# Patient Record
Sex: Male | Born: 1950 | Race: White | Hispanic: No | Smoking: Former smoker
Health system: Southern US, Community
[De-identification: ages and names within clinical notes are randomized; demographics above are authoritative.]

## PROBLEM LIST (undated history)

## (undated) DIAGNOSIS — N189 Chronic kidney disease, unspecified: Secondary | ICD-10-CM

## (undated) DIAGNOSIS — I10 Essential (primary) hypertension: Secondary | ICD-10-CM

## (undated) DIAGNOSIS — Z5189 Encounter for other specified aftercare: Secondary | ICD-10-CM

## (undated) DIAGNOSIS — I255 Ischemic cardiomyopathy: Secondary | ICD-10-CM

## (undated) DIAGNOSIS — Z87442 Personal history of urinary calculi: Secondary | ICD-10-CM

## (undated) DIAGNOSIS — Z95810 Presence of automatic (implantable) cardiac defibrillator: Secondary | ICD-10-CM

## (undated) DIAGNOSIS — I219 Acute myocardial infarction, unspecified: Secondary | ICD-10-CM

## (undated) DIAGNOSIS — M1990 Unspecified osteoarthritis, unspecified site: Secondary | ICD-10-CM

## (undated) DIAGNOSIS — D649 Anemia, unspecified: Secondary | ICD-10-CM

## (undated) DIAGNOSIS — H269 Unspecified cataract: Secondary | ICD-10-CM

## (undated) DIAGNOSIS — K219 Gastro-esophageal reflux disease without esophagitis: Secondary | ICD-10-CM

## (undated) DIAGNOSIS — G4700 Insomnia, unspecified: Secondary | ICD-10-CM

## (undated) DIAGNOSIS — J449 Chronic obstructive pulmonary disease, unspecified: Secondary | ICD-10-CM

## (undated) DIAGNOSIS — I2510 Atherosclerotic heart disease of native coronary artery without angina pectoris: Secondary | ICD-10-CM

## (undated) DIAGNOSIS — I639 Cerebral infarction, unspecified: Secondary | ICD-10-CM

## (undated) DIAGNOSIS — E785 Hyperlipidemia, unspecified: Secondary | ICD-10-CM

## (undated) HISTORY — DX: Essential (primary) hypertension: I10

## (undated) HISTORY — DX: Atherosclerotic heart disease of native coronary artery without angina pectoris: I25.10

## (undated) HISTORY — DX: Ischemic cardiomyopathy: I25.5

## (undated) HISTORY — PX: HERNIA REPAIR: SHX51

## (undated) HISTORY — PX: MULTIPLE TOOTH EXTRACTIONS: SHX2053

## (undated) HISTORY — DX: Encounter for other specified aftercare: Z51.89

## (undated) HISTORY — PX: MANDIBLE SURGERY: SHX707

## (undated) HISTORY — DX: Hyperlipidemia, unspecified: E78.5

## (undated) HISTORY — DX: Cerebral infarction, unspecified: I63.9

## (undated) HISTORY — PX: ICD IMPLANT: EP1208

## (undated) HISTORY — DX: Presence of automatic (implantable) cardiac defibrillator: Z95.810

## (undated) HISTORY — PX: UPPER GI ENDOSCOPY: SHX6162

## (undated) HISTORY — DX: Chronic kidney disease, unspecified: N18.9

## (undated) HISTORY — PX: EYE SURGERY: SHX253

## (undated) HISTORY — DX: Unspecified cataract: H26.9

## (undated) HISTORY — DX: Acute myocardial infarction, unspecified: I21.9

## (undated) HISTORY — DX: Unspecified osteoarthritis, unspecified site: M19.90

## (undated) HISTORY — PX: WISDOM TOOTH EXTRACTION: SHX21

## (undated) HISTORY — PX: OTHER SURGICAL HISTORY: SHX169

## (undated) HISTORY — DX: Chronic obstructive pulmonary disease, unspecified: J44.9

## (undated) HISTORY — PX: COLONOSCOPY: SHX174

## (undated) HISTORY — DX: Gastro-esophageal reflux disease without esophagitis: K21.9

## (undated) HISTORY — PX: TONSILLECTOMY: SUR1361

## (undated) HISTORY — PX: JOINT REPLACEMENT: SHX530

## (undated) HISTORY — PX: CORONARY ARTERY BYPASS GRAFT: SHX141

---

## 2016-02-20 DIAGNOSIS — N200 Calculus of kidney: Secondary | ICD-10-CM | POA: Insufficient documentation

## 2016-06-28 DIAGNOSIS — I472 Ventricular tachycardia, unspecified: Secondary | ICD-10-CM | POA: Insufficient documentation

## 2016-06-28 HISTORY — DX: Ventricular tachycardia, unspecified: I47.20

## 2016-06-29 DIAGNOSIS — I420 Dilated cardiomyopathy: Secondary | ICD-10-CM | POA: Insufficient documentation

## 2016-06-29 DIAGNOSIS — I255 Ischemic cardiomyopathy: Secondary | ICD-10-CM | POA: Insufficient documentation

## 2018-04-03 DIAGNOSIS — D649 Anemia, unspecified: Secondary | ICD-10-CM | POA: Insufficient documentation

## 2018-04-03 DIAGNOSIS — I701 Atherosclerosis of renal artery: Secondary | ICD-10-CM | POA: Insufficient documentation

## 2018-04-03 DIAGNOSIS — I5022 Chronic systolic (congestive) heart failure: Secondary | ICD-10-CM | POA: Insufficient documentation

## 2018-04-03 DIAGNOSIS — D696 Thrombocytopenia, unspecified: Secondary | ICD-10-CM | POA: Insufficient documentation

## 2018-04-03 DIAGNOSIS — E785 Hyperlipidemia, unspecified: Secondary | ICD-10-CM | POA: Insufficient documentation

## 2018-04-03 DIAGNOSIS — G8929 Other chronic pain: Secondary | ICD-10-CM | POA: Insufficient documentation

## 2018-04-03 DIAGNOSIS — M109 Gout, unspecified: Secondary | ICD-10-CM | POA: Insufficient documentation

## 2018-04-03 DIAGNOSIS — I10 Essential (primary) hypertension: Secondary | ICD-10-CM | POA: Insufficient documentation

## 2018-04-03 DIAGNOSIS — M1000 Idiopathic gout, unspecified site: Secondary | ICD-10-CM | POA: Insufficient documentation

## 2018-04-03 DIAGNOSIS — N189 Chronic kidney disease, unspecified: Secondary | ICD-10-CM | POA: Insufficient documentation

## 2018-04-03 DIAGNOSIS — N179 Acute kidney failure, unspecified: Secondary | ICD-10-CM | POA: Insufficient documentation

## 2018-06-16 DIAGNOSIS — N1832 Chronic kidney disease, stage 3b: Secondary | ICD-10-CM | POA: Insufficient documentation

## 2018-06-16 DIAGNOSIS — N1831 Chronic kidney disease, stage 3a: Secondary | ICD-10-CM | POA: Insufficient documentation

## 2021-03-25 DIAGNOSIS — J189 Pneumonia, unspecified organism: Secondary | ICD-10-CM | POA: Insufficient documentation

## 2021-03-25 DIAGNOSIS — N400 Enlarged prostate without lower urinary tract symptoms: Secondary | ICD-10-CM | POA: Insufficient documentation

## 2021-03-25 DIAGNOSIS — M75102 Unspecified rotator cuff tear or rupture of left shoulder, not specified as traumatic: Secondary | ICD-10-CM | POA: Insufficient documentation

## 2021-04-20 ENCOUNTER — Other Ambulatory Visit: Payer: Self-pay | Admitting: Internal Medicine

## 2021-04-20 ENCOUNTER — Other Ambulatory Visit: Payer: Self-pay

## 2021-04-20 DIAGNOSIS — J189 Pneumonia, unspecified organism: Secondary | ICD-10-CM

## 2021-04-20 DIAGNOSIS — J449 Chronic obstructive pulmonary disease, unspecified: Secondary | ICD-10-CM

## 2021-04-26 ENCOUNTER — Ambulatory Visit (HOSPITAL_BASED_OUTPATIENT_CLINIC_OR_DEPARTMENT_OTHER): Payer: Medicare PPO

## 2021-04-26 ENCOUNTER — Encounter (HOSPITAL_BASED_OUTPATIENT_CLINIC_OR_DEPARTMENT_OTHER): Payer: Self-pay

## 2021-04-28 ENCOUNTER — Ambulatory Visit (HOSPITAL_BASED_OUTPATIENT_CLINIC_OR_DEPARTMENT_OTHER): Payer: Medicare PPO

## 2021-05-05 ENCOUNTER — Ambulatory Visit (HOSPITAL_BASED_OUTPATIENT_CLINIC_OR_DEPARTMENT_OTHER): Payer: Medicare PPO

## 2021-05-16 ENCOUNTER — Ambulatory Visit (HOSPITAL_BASED_OUTPATIENT_CLINIC_OR_DEPARTMENT_OTHER): Payer: Medicare PPO

## 2021-05-16 ENCOUNTER — Encounter (HOSPITAL_BASED_OUTPATIENT_CLINIC_OR_DEPARTMENT_OTHER): Payer: Self-pay

## 2021-05-17 ENCOUNTER — Encounter (HOSPITAL_BASED_OUTPATIENT_CLINIC_OR_DEPARTMENT_OTHER): Payer: Self-pay

## 2021-05-17 ENCOUNTER — Ambulatory Visit (HOSPITAL_BASED_OUTPATIENT_CLINIC_OR_DEPARTMENT_OTHER)
Admission: RE | Admit: 2021-05-17 | Discharge: 2021-05-17 | Disposition: A | Discharge status: Home / Self Care | Payer: Medicare PPO | Source: Ambulatory Visit | Attending: Internal Medicine | Admitting: Internal Medicine

## 2021-05-17 ENCOUNTER — Other Ambulatory Visit: Payer: Self-pay

## 2021-05-17 DIAGNOSIS — J449 Chronic obstructive pulmonary disease, unspecified: Secondary | ICD-10-CM

## 2021-05-17 DIAGNOSIS — J189 Pneumonia, unspecified organism: Secondary | ICD-10-CM | POA: Diagnosis present

## 2021-05-19 ENCOUNTER — Ambulatory Visit (HOSPITAL_BASED_OUTPATIENT_CLINIC_OR_DEPARTMENT_OTHER): Payer: Medicare PPO

## 2021-05-23 ENCOUNTER — Ambulatory Visit (INDEPENDENT_AMBULATORY_CARE_PROVIDER_SITE_OTHER): Payer: Medicare PPO | Admitting: Physician Assistant

## 2021-05-23 ENCOUNTER — Other Ambulatory Visit: Payer: Self-pay

## 2021-05-23 VITALS — BP 110/72 | HR 82 | Temp 98.0°F | Ht 67.0 in | Wt 118.0 lb

## 2021-05-23 DIAGNOSIS — M25512 Pain in left shoulder: Secondary | ICD-10-CM

## 2021-05-23 DIAGNOSIS — N401 Enlarged prostate with lower urinary tract symptoms: Secondary | ICD-10-CM

## 2021-05-23 DIAGNOSIS — M25511 Pain in right shoulder: Secondary | ICD-10-CM

## 2021-05-23 DIAGNOSIS — M5441 Lumbago with sciatica, right side: Secondary | ICD-10-CM

## 2021-05-23 DIAGNOSIS — Z96611 Presence of right artificial shoulder joint: Secondary | ICD-10-CM

## 2021-05-23 DIAGNOSIS — M5442 Lumbago with sciatica, left side: Secondary | ICD-10-CM

## 2021-05-23 DIAGNOSIS — I2510 Atherosclerotic heart disease of native coronary artery without angina pectoris: Secondary | ICD-10-CM

## 2021-05-23 DIAGNOSIS — Z951 Presence of aortocoronary bypass graft: Secondary | ICD-10-CM

## 2021-05-23 DIAGNOSIS — G4700 Insomnia, unspecified: Secondary | ICD-10-CM

## 2021-05-23 DIAGNOSIS — N1831 Chronic kidney disease, stage 3a: Secondary | ICD-10-CM | POA: Diagnosis not present

## 2021-05-23 DIAGNOSIS — R32 Unspecified urinary incontinence: Secondary | ICD-10-CM

## 2021-05-23 DIAGNOSIS — E785 Hyperlipidemia, unspecified: Secondary | ICD-10-CM

## 2021-05-23 DIAGNOSIS — J449 Chronic obstructive pulmonary disease, unspecified: Secondary | ICD-10-CM

## 2021-05-23 DIAGNOSIS — Z95810 Presence of automatic (implantable) cardiac defibrillator: Secondary | ICD-10-CM

## 2021-05-23 DIAGNOSIS — Z7689 Persons encountering health services in other specified circumstances: Secondary | ICD-10-CM

## 2021-05-23 DIAGNOSIS — R0609 Other forms of dyspnea: Secondary | ICD-10-CM

## 2021-05-23 DIAGNOSIS — G629 Polyneuropathy, unspecified: Secondary | ICD-10-CM

## 2021-05-23 DIAGNOSIS — G8929 Other chronic pain: Secondary | ICD-10-CM

## 2021-05-23 DIAGNOSIS — I10 Essential (primary) hypertension: Secondary | ICD-10-CM

## 2021-05-23 MED ORDER — GABAPENTIN 300 MG PO CAPS: 300.0000 mg | ORAL_CAPSULE | Freq: Every day | ORAL | 1 refills | Status: DC

## 2021-05-23 MED ORDER — ALBUTEROL SULFATE (2.5 MG/3ML) 0.083% IN NEBU: 2.5000 mg | INHALATION_SOLUTION | Freq: Four times a day (QID) | RESPIRATORY_TRACT | 0 refills | Status: DC | PRN

## 2021-05-23 MED ORDER — STIOLTO RESPIMAT 2.5-2.5 MCG/ACT IN AERS: 2.0000 | INHALATION_SPRAY | Freq: Every day | RESPIRATORY_TRACT | 0 refills | Status: DC

## 2021-05-23 NOTE — Progress Notes (Signed)
New Patient Office Visit  Subjective:  Patient ID: Andrew Townsend, male    DOB: 12/11/1950  Age: 71 y.o. MRN: [id number]  CC:  Chief Complaint  Patient presents with   New Patient (Initial Visit)    HPI Andrew Townsend presents to establish care. Patient has relocated from [facility], [place].  Patient has a complex medical history including hypertension, hyperlipidemia, myocardial infarction, COPD, chronic kidney disease, neuropathy, BPH, gout and chronic back and shoulder pain.  Patient is followed by several specialists including cardiology, orthopedics, pain management and nephrology.  Requesting to get established with new specialists in the area.  Patient reports has noticed becoming more short of breath with activity such as walking upstairs.  States was given some type of fluticasone inhaler which he discontinued because it made his throat close at night. Has a nebulizer machine which he uses every so often.  Patient was admitted to the hospital in November 2022 for pneumonia and acute kidney injury. Reports recently had a chest CT repeated.  No past medical history on file.  No past surgical history on file.  No family history on file.  Social History   Socioeconomic History   Marital status: Widowed    Spouse name: Not on file   Number of children: Not on file   Years of education: Not on file   Highest education level: Not on file  Occupational History   Not on file  Tobacco Use   Smoking status: Not on file   Smokeless tobacco: Not on file  Substance and Sexual Activity   Alcohol use: Not on file   Drug use: Not on file   Sexual activity: Not on file  Other Topics Concern   Not on file  Social History Narrative   Not on file   Social Determinants of Health   Financial Resource Strain: Not on file  Food Insecurity: Not on file  Transportation Needs: Not on file  Physical Activity: Not on file  Stress: Not on file  Social Connections: Not on file  Intimate Partner  Violence: Not on file    ROS Review of Systems Review of Systems:  A fourteen system review of systems was performed and found to be positive as per HPI. Objective:   Today's Vitals: BP 110/72    Pulse 82    Temp 98 F (36.7 C)    Ht _0  (1.702 m)    Wt 118 lb (53.5 kg)    SpO2 96%    BMI 18.48 kg/m   Physical Exam General: Pleasant and cooperative, in no acute distress  Neuro:  Alert and oriented,  extra-ocular muscles intact  HEENT:  Normocephalic, atraumatic, neck supple Skin:  no gross rash, warm, pink. Cardiac:  RRR, S1 S2 Respiratory: CTA B/L, unlabored. Vascular:  Ext warm, no cyanosis apprec.; cap RF less 2 sec. Psych:  No HI/SI, judgement and insight good, Euthymic mood. Full Affect.  Assessment & Plan:   Problem List Items Addressed This Visit       Cardiovascular and Mediastinum   Hypertension   Relevant Medications   amLODipine (NORVASC) 2.5 MG tablet   aspirin 81 MG EC tablet   ezetimibe (ZETIA) 10 MG tablet   metoprolol succinate (TOPROL-XL) 100 MG 24 hr tablet   rosuvastatin (CRESTOR) 40 MG tablet   terazosin (HYTRIN) 10 MG capsule   Other Relevant Orders   Comp Met (CMET) (Completed)   CBC w/Diff (Completed)   Atherosclerosis of native coronary artery of native  heart without angina pectoris   Relevant Medications   amLODipine (NORVASC) 2.5 MG tablet   aspirin 81 MG EC tablet   ezetimibe (ZETIA) 10 MG tablet   metoprolol succinate (TOPROL-XL) 100 MG 24 hr tablet   rosuvastatin (CRESTOR) 40 MG tablet   terazosin (HYTRIN) 10 MG capsule   Other Relevant Orders   Ambulatory referral to Cardiology     Respiratory   Chronic obstructive pulmonary disease (HCC)   Relevant Medications   albuterol (PROVENTIL) (2.5 MG/3ML) 0.083% nebulizer solution   Tiotropium Bromide-Olodaterol (STIOLTO RESPIMAT) 2.5-2.5 MCG/ACT AERS     Nervous and Auditory   Neuropathy   Relevant Medications   gabapentin (NEURONTIN) 300 MG capsule     Genitourinary   Stage 3a  chronic kidney disease (CKD) (HCC)   Relevant Orders   Comp Met (CMET) (Completed)     Other   S/P CABG (coronary artery bypass graft)   Hyperlipidemia   Relevant Medications   amLODipine (NORVASC) 2.5 MG tablet   aspirin 81 MG EC tablet   ezetimibe (ZETIA) 10 MG tablet   metoprolol succinate (TOPROL-XL) 100 MG 24 hr tablet   rosuvastatin (CRESTOR) 40 MG tablet   terazosin (HYTRIN) 10 MG capsule   ICD (implantable cardioverter-defibrillator) in place   Relevant Orders   Ambulatory referral to Cardiology   Insomnia   Other Visit Diagnoses     Encounter to establish care    -  Primary   Chronic pain of both shoulders       Relevant Medications   aspirin 81 MG EC tablet   buprenorphine (BUTRANS) 15 MCG/HR   oxyCODONE-acetaminophen (PERCOCET) 10-325 MG tablet   tiZANidine (ZANAFLEX) 4 MG tablet   gabapentin (NEURONTIN) 300 MG capsule   Other Relevant Orders   Ambulatory referral to Orthopedic Surgery   Ambulatory referral to Pain Clinic   Chronic bilateral low back pain with bilateral sciatica       Relevant Medications   aspirin 81 MG EC tablet   buprenorphine (BUTRANS) 15 MCG/HR   oxyCODONE-acetaminophen (PERCOCET) 10-325 MG tablet   tiZANidine (ZANAFLEX) 4 MG tablet   zolpidem (AMBIEN) 5 MG tablet   gabapentin (NEURONTIN) 300 MG capsule   Other Relevant Orders   Ambulatory referral to Orthopedic Surgery   Ambulatory referral to Pain Clinic   Dyspnea on exertion       Relevant Orders   B Nat Peptide (Completed)   Comp Met (CMET) (Completed)   CBC w/Diff (Completed)   TSH (Completed)   Urinary incontinence, unspecified type       Relevant Orders   Ambulatory referral to Urology   Benign prostatic hyperplasia with lower urinary tract symptoms, symptom details unspecified       S/P reverse total shoulder arthroplasty, right          Encounter to establish care: -Will request records and review once available. -Reviewed [facility] hospital records from  03/24/2021-03/25/2021.  Discussed with patient 05/17/2021 chest CT results which revealed bilateral consolidations with adjacent nodularity, most pronounced in the posterior right upper lobe, findings are likely sequela of infection or aspiration.  Follow-up chest CT is recommended in 2 to 3 months to ensure resolution.  Bilateral bronchial wall thickening, findings can be seen in the setting of bronchitis. Aortic atherosclerosis and emphysema. -Patient endorses dyspnea on exertion and unable to tolerate ICS- fluticasone so recommend to trial new inhaler for COPD, patient is agreeable to try Stiolto. Recommend repeating chest CT in 2 months. Will collect BNP to evaluate  for acute CHF exacerbation for dyspnea.   Atherosclerosis of native coronary artery of native heart without angina pectoris: -Will place referral to cardiology. -S/p PTCI in 1992, CABG x 4 in 2008. -Patient on rosuvastatin 40 mg, Zetia 10 mg,aspirin 81 mg, metoprolol succinate 100 mg 1.5 tab (125 mg).  ICD in place: -Will place cardiology referral. -Implantation in Feb 2018 per hospital records.  Hypertension: -BP in office stable. -On amlodipine 2.5 mg daily and metoprolol succinate 100 mg 1.5 tab daily. -Will continue to monitor.  Hyperlipidemia: -On rosuvastatin 40 mg and Zetia 10 mg. -Will continue to monitor.  COPD: -Discussed with patient management options and agreeable to try Stiolto. Recommend to continue neb treatments as needed. -Former smoker, quit 2015. -Will inquire smoking pack hx at f/up, pt failed to provide on new patient intake.  Neuropathy: -On gabapentin 300 mg at bedtime. -Patient completed PT for lumbar radiculopathy. -Will continue to monitor.  Stage 3a CKD: -Will collect CMP to monitor renal function. Pt prefers to wait on referral to nephrology at this time. Advised will continue to monitor renal function and if significant decline from baseline recommend establishing care with new  nephrologist. Pt verbalized understanding. -Will continue to monitor.  Insomnia: -On zolpidem 5 mg at bedtime as needed for sleep. -Will continue to monitor.  BPH with LUTS: -On terazosin 10 mg.  -Patient reports worsening urinary incontinence, will place referral to urology.  Chronic pain of both shoulders: -Patient is s/p reverse total shoulder arthoplasty, right. -Will place referral to Orthopedics.  Chronic low back pain with bilateral sciatica: -Patient followed by pain management. Discussed with patient controlled substance policy for opioids and patient is agreeable to referral to get established pain management in the area. Will also place referral to Orthopedics. Patient completed PT 01/2021-02/2021. -On oxycodone/acetaminophen 10-325 mg, tizanidine 4 mg TID prn, Butrans patch weekly.    Outpatient Encounter Medications as of 05/23/2021  Medication Sig   albuterol (PROVENTIL) (2.5 MG/3ML) 0.083% nebulizer solution Take 3 mLs (2.5 mg total) by nebulization every 6 (six) hours as needed for wheezing or shortness of breath.   Tiotropium Bromide-Olodaterol (STIOLTO RESPIMAT) 2.5-2.5 MCG/ACT AERS Inhale 2 puffs into the lungs daily.   allopurinol (ZYLOPRIM) 100 MG tablet Take 100 mg by mouth daily.   amLODipine (NORVASC) 2.5 MG tablet Take 2.5 mg by mouth daily.   aspirin 81 MG EC tablet Take by mouth.   buprenorphine (BUTRANS) 15 MCG/HR SMARTSIG:Patch(s) Topical Once a Week   ezetimibe (ZETIA) 10 MG tablet Take by mouth.   gabapentin (NEURONTIN) 300 MG capsule Take 1 capsule (300 mg total) by mouth at bedtime.   metoprolol succinate (TOPROL-XL) 100 MG 24 hr tablet Take by mouth.   oxyCODONE-acetaminophen (PERCOCET) 10-325 MG tablet Take by mouth.   pantoprazole (PROTONIX) 40 MG tablet Take 40 mg by mouth daily.   Potassium Chloride ER 20 MEQ TBCR Take 1 tablet by mouth daily.   rosuvastatin (CRESTOR) 40 MG tablet Take 40 mg by mouth at bedtime.   terazosin (HYTRIN) 10 MG  capsule Take 10 mg by mouth at bedtime.   tiZANidine (ZANAFLEX) 4 MG tablet Take 4 mg by mouth every 8 (eight) hours as needed.   zolpidem (AMBIEN) 5 MG tablet Take 5 mg by mouth at bedtime as needed.   [DISCONTINUED] gabapentin (NEURONTIN) 300 MG capsule Take 300 mg by mouth at bedtime.   No facility-administered encounter medications on file as of 05/23/2021.    Follow-up: Return in about 6 months (around 11/20/2021)  for HTN, HLD, med management .   Lorrene Reid, PA-C

## 2021-05-24 LAB — COMPREHENSIVE METABOLIC PANEL
ALT: 39 IU/L (ref 0–44)
AST: 55 IU/L — ABNORMAL HIGH (ref 0–40)
Albumin/Globulin Ratio: 1.5 (ref 1.2–2.2)
Albumin: 3.8 g/dL (ref 3.8–4.8)
Alkaline Phosphatase: 117 IU/L (ref 44–121)
BUN/Creatinine Ratio: 9 — ABNORMAL LOW (ref 10–24)
BUN: 19 mg/dL (ref 8–27)
Bilirubin Total: 0.6 mg/dL (ref 0.0–1.2)
CO2: 21 mmol/L (ref 20–29)
Calcium: 9.8 mg/dL (ref 8.6–10.2)
Chloride: 105 mmol/L (ref 96–106)
Creatinine, Ser: 2.09 mg/dL — ABNORMAL HIGH (ref 0.76–1.27)
Globulin, Total: 2.5 g/dL (ref 1.5–4.5)
Glucose: 110 mg/dL — ABNORMAL HIGH (ref 70–99)
Potassium: 4.8 mmol/L (ref 3.5–5.2)
Sodium: 140 mmol/L (ref 134–144)
Total Protein: 6.3 g/dL (ref 6.0–8.5)
eGFR: 33 mL/min/{1.73_m2} — ABNORMAL LOW (ref >59)

## 2021-05-24 LAB — CBC WITH DIFFERENTIAL/PLATELET
Basophils Absolute: 0.1 10*3/uL (ref 0.0–0.2)
Basos: 1 %
EOS (ABSOLUTE): 0.1 10*3/uL (ref 0.0–0.4)
Eos: 1 %
Hematocrit: 39.3 % (ref 37.5–51.0)
Hemoglobin: 12.4 g/dL — ABNORMAL LOW (ref 13.0–17.7)
Immature Grans (Abs): 0.1 10*3/uL (ref 0.0–0.1)
Immature Granulocytes: 1 %
Lymphocytes Absolute: 2.2 10*3/uL (ref 0.7–3.1)
Lymphs: 21 %
MCH: 27.7 pg (ref 26.6–33.0)
MCHC: 31.6 g/dL (ref 31.5–35.7)
MCV: 88 fL (ref 79–97)
Monocytes Absolute: 0.7 10*3/uL (ref 0.1–0.9)
Monocytes: 7 %
Neutrophils Absolute: 7.5 10*3/uL — ABNORMAL HIGH (ref 1.4–7.0)
Neutrophils: 69 %
Platelets: 291 10*3/uL (ref 150–450)
RBC: 4.48 x10E6/uL (ref 4.14–5.80)
RDW: 15.6 % — ABNORMAL HIGH (ref 11.6–15.4)
WBC: 10.6 10*3/uL (ref 3.4–10.8)

## 2021-05-24 LAB — BRAIN NATRIURETIC PEPTIDE: BNP: 124.6 pg/mL — ABNORMAL HIGH (ref 0.0–100.0)

## 2021-05-24 LAB — TSH: TSH: 4.02 u[IU]/mL (ref 0.450–4.500)

## 2021-05-26 DIAGNOSIS — G4700 Insomnia, unspecified: Secondary | ICD-10-CM | POA: Insufficient documentation

## 2021-05-26 DIAGNOSIS — Z951 Presence of aortocoronary bypass graft: Secondary | ICD-10-CM | POA: Insufficient documentation

## 2021-05-26 DIAGNOSIS — N1831 Chronic kidney disease, stage 3a: Secondary | ICD-10-CM | POA: Insufficient documentation

## 2021-05-26 DIAGNOSIS — E785 Hyperlipidemia, unspecified: Secondary | ICD-10-CM | POA: Insufficient documentation

## 2021-05-26 DIAGNOSIS — G629 Polyneuropathy, unspecified: Secondary | ICD-10-CM | POA: Insufficient documentation

## 2021-05-26 DIAGNOSIS — Z95810 Presence of automatic (implantable) cardiac defibrillator: Secondary | ICD-10-CM | POA: Insufficient documentation

## 2021-05-26 DIAGNOSIS — G588 Other specified mononeuropathies: Secondary | ICD-10-CM | POA: Insufficient documentation

## 2021-05-26 DIAGNOSIS — I25119 Atherosclerotic heart disease of native coronary artery with unspecified angina pectoris: Secondary | ICD-10-CM | POA: Insufficient documentation

## 2021-05-26 DIAGNOSIS — I10 Essential (primary) hypertension: Secondary | ICD-10-CM | POA: Insufficient documentation

## 2021-05-26 DIAGNOSIS — I2510 Atherosclerotic heart disease of native coronary artery without angina pectoris: Secondary | ICD-10-CM | POA: Insufficient documentation

## 2021-05-26 DIAGNOSIS — J449 Chronic obstructive pulmonary disease, unspecified: Secondary | ICD-10-CM | POA: Insufficient documentation

## 2021-06-01 ENCOUNTER — Other Ambulatory Visit: Payer: Self-pay

## 2021-06-01 ENCOUNTER — Ambulatory Visit (HOSPITAL_BASED_OUTPATIENT_CLINIC_OR_DEPARTMENT_OTHER): Payer: Medicare PPO | Admitting: Orthopaedic Surgery

## 2021-06-01 ENCOUNTER — Other Ambulatory Visit (HOSPITAL_BASED_OUTPATIENT_CLINIC_OR_DEPARTMENT_OTHER): Payer: Self-pay | Admitting: Orthopaedic Surgery

## 2021-06-01 ENCOUNTER — Ambulatory Visit (HOSPITAL_BASED_OUTPATIENT_CLINIC_OR_DEPARTMENT_OTHER)
Admission: RE | Admit: 2021-06-01 | Discharge: 2021-06-01 | Disposition: A | Discharge status: Home / Self Care | Payer: Medicare PPO | Source: Ambulatory Visit | Attending: Orthopaedic Surgery | Admitting: Orthopaedic Surgery

## 2021-06-01 DIAGNOSIS — M25512 Pain in left shoulder: Secondary | ICD-10-CM | POA: Insufficient documentation

## 2021-06-01 DIAGNOSIS — M19012 Primary osteoarthritis, left shoulder: Secondary | ICD-10-CM | POA: Diagnosis not present

## 2021-06-01 DIAGNOSIS — M25511 Pain in right shoulder: Secondary | ICD-10-CM

## 2021-06-01 NOTE — Progress Notes (Signed)
Chief Complaint: Left shoulder pain, lower back pain     History of Present Illness:    Quame Spratlin is a 71 y.o. male right-hand-dominant male presents with ongoing left shoulder pain and limited range of motion for multiple years.  He previously had a right shoulder arthroplasty done in March 2022 by Dr. Alfonse Ras.  He is has had a history of bilateral rotator cuff repairs done many years prior he does not specifically remember when.  He believes that the left shoulder is nonpainful as he is compensating for the recovery on his right shoulder.  He has not been completely happy with the recovery and rehab on the right shoulder.  He is here today for further advice regarding this.  He has had multiple injections on the left shoulder the most recent 1 done in summer 2020 did not help at all.  This is achy and limited.  Regard to his lower back pain he has had multiple MRIs and ultimately recommended for lumbar fusion although he is unwilling to do this.  He is quite concerned about the possibility of undergoing lumbar surgery.    Surgical History:   None  PMH/PSH/Family History/Social History/Meds/Allergies:   No past medical history on file. No past surgical history on file. Social History   Socioeconomic History   Marital status: Widowed    Spouse name: Not on file   Number of children: Not on file   Years of education: Not on file   Highest education level: Not on file  Occupational History   Not on file  Tobacco Use   Smoking status: Not on file   Smokeless tobacco: Not on file  Substance and Sexual Activity   Alcohol use: Not on file   Drug use: Not on file   Sexual activity: Not on file  Other Topics Concern   Not on file  Social History Narrative   Not on file   Social Determinants of Health   Financial Resource Strain: Not on file  Food Insecurity: Not on file  Transportation Needs: Not on file  Physical Activity: Not on file  Stress:  Not on file  Social Connections: Not on file   No family history on file. Not on File Current Outpatient Medications  Medication Sig Dispense Refill   albuterol (PROVENTIL) (2.5 MG/3ML) 0.083% nebulizer solution Take 3 mLs (2.5 mg total) by nebulization every 6 (six) hours as needed for wheezing or shortness of breath. 150 mL 0   allopurinol (ZYLOPRIM) 100 MG tablet Take 100 mg by mouth daily.     amLODipine (NORVASC) 2.5 MG tablet Take 2.5 mg by mouth daily.     aspirin 81 MG EC tablet Take by mouth.     buprenorphine (BUTRANS) 15 MCG/HR SMARTSIG:Patch(s) Topical Once a Week     ezetimibe (ZETIA) 10 MG tablet Take by mouth.     gabapentin (NEURONTIN) 300 MG capsule Take 1 capsule (300 mg total) by mouth at bedtime. 90 capsule 1   metoprolol succinate (TOPROL-XL) 100 MG 24 hr tablet Take by mouth.     oxyCODONE-acetaminophen (PERCOCET) 10-325 MG tablet Take by mouth.     pantoprazole (PROTONIX) 40 MG tablet Take 40 mg by mouth daily.     Potassium Chloride ER 20 MEQ TBCR Take 1 tablet by mouth daily.  rosuvastatin (CRESTOR) 40 MG tablet Take 40 mg by mouth at bedtime.     terazosin (HYTRIN) 10 MG capsule Take 10 mg by mouth at bedtime.     Tiotropium Bromide-Olodaterol (STIOLTO RESPIMAT) 2.5-2.5 MCG/ACT AERS Inhale 2 puffs into the lungs daily. 4 g 0   tiZANidine (ZANAFLEX) 4 MG tablet Take 4 mg by mouth every 8 (eight) hours as needed.     zolpidem (AMBIEN) 5 MG tablet Take 5 mg by mouth at bedtime as needed.     No current facility-administered medications for this visit.   No results found.  Review of Systems:   A ROS was performed including pertinent positives and negatives as documented in the HPI.  Physical Exam :   Constitutional: NAD and appears stated age Neurological: Alert and oriented Psych: Appropriate affect and cooperative There were no vitals taken for this visit.   Comprehensive Musculoskeletal Exam:    Musculoskeletal Exam    Inspection Right Left  Skin  No atrophy or winging No atrophy or winging  Palpation    Tenderness None Left glenohumeral  Range of Motion    Flexion (passive) 90 70  Flexion (active) 80 70  Abduction 70 50  ER at the side 30 10  Can reach behind back to L3 L3  Strength     Limited Limited  Special Tests    Pseudoparalytic No No  Neurologic    Fires PIN, radial, median, ulnar, musculocutaneous, axillary, suprascapular, long thoracic, and spinal accessory innervated muscles. No abnormal sensibility  Vascular/Lymphatic    Radial Pulse 2+ 2+  Cervical Exam    Patient has symmetric cervical range of motion with negative Spurling's test.  Special Test:       Imaging:   Xray (\3 views left shoulder): Advanced glenohumeral osteoarthritis   I personally reviewed and interpreted the radiographs.   Assessment:   71 year old male with left advanced glenohumeral osteoarthritis which is failed conservative management.  Injections no longer been helpful for him.  I advised that ultimately I do believe he would be a candidate for reverse shoulder arthroplasty.  We described that our goal would be range of motion higher than what he is experienced on his contralateral side.  He would like more time to consider whether or not he is a candidate for this.  In the meantime given his history of lumbar pain I would like to recommend home physical therapy to assist with ambulation gait as well as work with him on core strengthening.  I have given him a set of core exercises which she can perform at home.  Plan :    -Return to clinic in 8 weeks for further discussion     I personally saw and evaluated the patient, and participated in the management and treatment plan.  Huel Cote, MD Attending Physician, Orthopedic Surgery  This document was dictated using Dragon voice recognition software. A reasonable attempt at proof reading has been made to minimize errors.

## 2021-06-06 ENCOUNTER — Telehealth: Payer: Self-pay | Admitting: Orthopaedic Surgery

## 2021-06-06 NOTE — Telephone Encounter (Signed)
Spoke to [place] with [facility] and informed her the frequency was okay with Dr. Sammuel Hines

## 2021-06-06 NOTE — Telephone Encounter (Signed)
Jenna with [facility] called wanting to get orders approved with the frequency of twice a week for 4 weeks and then once a week for 2 weeks. Eileen Stanford would like a CB to get this okayed.   CB# [phone]

## 2021-06-07 ENCOUNTER — Ambulatory Visit: Payer: Medicare PPO | Admitting: Urology

## 2021-06-07 ENCOUNTER — Other Ambulatory Visit: Payer: Self-pay

## 2021-06-07 ENCOUNTER — Encounter: Payer: Self-pay | Admitting: Urology

## 2021-06-07 VITALS — BP 126/76 | HR 71 | Ht 67.0 in | Wt 125.0 lb

## 2021-06-07 DIAGNOSIS — N401 Enlarged prostate with lower urinary tract symptoms: Secondary | ICD-10-CM | POA: Diagnosis not present

## 2021-06-07 DIAGNOSIS — N138 Other obstructive and reflux uropathy: Secondary | ICD-10-CM

## 2021-06-07 DIAGNOSIS — N3281 Overactive bladder: Secondary | ICD-10-CM

## 2021-06-07 DIAGNOSIS — R32 Unspecified urinary incontinence: Secondary | ICD-10-CM | POA: Diagnosis not present

## 2021-06-07 DIAGNOSIS — R351 Nocturia: Secondary | ICD-10-CM | POA: Diagnosis not present

## 2021-06-07 LAB — BLADDER SCAN AMB NON-IMAGING

## 2021-06-07 MED ORDER — OXYBUTYNIN CHLORIDE ER 10 MG PO TB24: 10.0000 mg | ORAL_TABLET | Freq: Every day | ORAL | 11 refills | Status: DC

## 2021-06-07 NOTE — Patient Instructions (Signed)

## 2021-06-07 NOTE — Progress Notes (Signed)
° °  06/07/21 11:16 AM   Andrew Townsend 09-Oct-1950 MR:[id number]  CC: Urinary urgency, frequency, nocturia  HPI: 71 year old male with a number of medical problems including hypertension, CAD, COPD, CKD, BPH who reports 6 to 12 months of worsening urinary symptoms.  He reports urgency and frequency during the day with some urge incontinence, as well as nocturia 4 times overnight.  He is drinking water and tea during the day.  He has been on terazosin long-term for BPH.  He denies any dysuria or gross hematuria.  He denies history of sleep apnea.  Renal ultrasound in November 2022 with no hydronephrosis, normal-appearing bladder.  Urinalysis today is completely benign, PVR is normal at 42 mL.     Social History:  reports that he has quit smoking. His smoking use included cigarettes. He has never used smokeless tobacco. He reports that he does not use drugs. No history on file for alcohol use.  Physical Exam: BP 126/76    Pulse 71    Ht 5\' 7"  (1.702 m)    Wt 125 lb (56.7 kg)    BMI 19.58 kg/m    Constitutional:  Alert and oriented, No acute distress. Cardiovascular: No clubbing, cyanosis, or edema. Respiratory: Normal respiratory effort, no increased work of breathing. GI: Abdomen is soft, nontender, nondistended, no abdominal masses GU: Patent meatus, no lesions, testicles 20 cc and descended bilaterally without masses DRE: 40 g, smooth, no nodules or masses  Laboratory Data: Reviewed, see HPI  Pertinent Imaging: I have personally viewed and interpreted the renal ultrasound from November showing no hydronephrosis, decompressed bladder without suspicious lesions.  Assessment & Plan:   71 year old male with 6 to 12 months of worsening urinary symptoms primarily frequency, urgency, urge incontinence, nocturia most consistent with overactive bladder.  Urinalysis is benign, and PVR is normal.  We discussed that overactive bladder (OAB) is not a disease, but is a symptom complex that is  generally not life-threatening.  Symptoms typically include urinary urgency, frequency, and urge incontinence.  There are numerous treatment options, however there are risks and benefits with both medical and surgical management.  First-line treatment is behavioral therapies including bladder training, pelvic floor muscle training, and fluid management.  Second line treatments include oral antimuscarinics(Ditropan er, Trospium) and beta-3 agonist (Mybetriq). There is typically a period of medication trial (4-8 weeks) to find the optimal therapy and dosing. If symptoms are bothersome despite the above management, third line options include intra-detrusor botox, peripheral tibial nerve stimulation (PTNS), and interstim (SNS). These are more invasive treatments with higher side effect profile, but may improve quality of life for patients with severe OAB symptoms.   Trial of oxybutynin 10 mg XL daily RTC 6 weeks for symptom check and PVR, consider discontinuing terazosin at that time if symptoms resolved  Andrew Madrid, MD 06/07/2021  [facility] [address] [place], [place] [address] [phone]

## 2021-06-08 LAB — URINALYSIS, COMPLETE
Bilirubin, UA: NEGATIVE
Glucose, UA: NEGATIVE
Ketones, UA: NEGATIVE
Leukocytes,UA: NEGATIVE
Nitrite, UA: NEGATIVE
Protein,UA: NEGATIVE
RBC, UA: NEGATIVE
Specific Gravity, UA: 1.02 (ref 1.005–1.030)
Urobilinogen, Ur: 0.2 mg/dL (ref 0.2–1.0)
pH, UA: 5.5 (ref 5.0–7.5)

## 2021-06-08 LAB — MICROSCOPIC EXAMINATION
Bacteria, UA: NONE SEEN
Epithelial Cells (non renal): NONE SEEN /hpf (ref 0–10)

## 2021-06-13 ENCOUNTER — Other Ambulatory Visit (HOSPITAL_BASED_OUTPATIENT_CLINIC_OR_DEPARTMENT_OTHER): Payer: Self-pay | Admitting: Orthopaedic Surgery

## 2021-06-13 ENCOUNTER — Telehealth: Payer: Self-pay | Admitting: Orthopaedic Surgery

## 2021-06-13 DIAGNOSIS — M25512 Pain in left shoulder: Secondary | ICD-10-CM

## 2021-06-13 NOTE — Telephone Encounter (Signed)
PT for [facility] ordered

## 2021-06-13 NOTE — Telephone Encounter (Signed)
[facility] called and pt has been discharge from home therapy. He would like to do outpatient.   CB [phone]

## 2021-06-17 ENCOUNTER — Other Ambulatory Visit: Payer: Self-pay | Admitting: Physician Assistant

## 2021-06-17 DIAGNOSIS — J449 Chronic obstructive pulmonary disease, unspecified: Secondary | ICD-10-CM

## 2021-06-19 ENCOUNTER — Encounter (HOSPITAL_BASED_OUTPATIENT_CLINIC_OR_DEPARTMENT_OTHER): Payer: Self-pay | Admitting: Cardiology

## 2021-06-19 ENCOUNTER — Other Ambulatory Visit: Payer: Self-pay

## 2021-06-19 ENCOUNTER — Ambulatory Visit (HOSPITAL_BASED_OUTPATIENT_CLINIC_OR_DEPARTMENT_OTHER): Payer: Medicare PPO | Admitting: Cardiology

## 2021-06-19 VITALS — BP 110/64 | HR 69 | Ht 67.0 in | Wt 131.6 lb

## 2021-06-19 DIAGNOSIS — N1832 Chronic kidney disease, stage 3b: Secondary | ICD-10-CM

## 2021-06-19 DIAGNOSIS — I2510 Atherosclerotic heart disease of native coronary artery without angina pectoris: Secondary | ICD-10-CM | POA: Diagnosis not present

## 2021-06-19 DIAGNOSIS — R600 Localized edema: Secondary | ICD-10-CM

## 2021-06-19 DIAGNOSIS — I255 Ischemic cardiomyopathy: Secondary | ICD-10-CM

## 2021-06-19 DIAGNOSIS — Z95810 Presence of automatic (implantable) cardiac defibrillator: Secondary | ICD-10-CM

## 2021-06-19 DIAGNOSIS — I701 Atherosclerosis of renal artery: Secondary | ICD-10-CM

## 2021-06-19 DIAGNOSIS — R0609 Other forms of dyspnea: Secondary | ICD-10-CM

## 2021-06-19 DIAGNOSIS — I5042 Chronic combined systolic (congestive) and diastolic (congestive) heart failure: Secondary | ICD-10-CM

## 2021-06-19 MED ORDER — FUROSEMIDE 40 MG PO TABS: 40.0000 mg | ORAL_TABLET | Freq: Every day | ORAL | 3 refills | Status: DC | PRN

## 2021-06-19 NOTE — Patient Instructions (Signed)
Medication Instructions:  Try 40 mg furosemide once a day in the morning. If you don't make a lot of urine within a few hours, the next day take 80 mg in the morning. I expect you will need to do this for a few days. If the swelling does not improve in a few days, call me.  *If you need a refill on your cardiac medications before your next appointment, please call your pharmacy*   Lab Work: None ordered today   Testing/Procedures: None ordered today   Follow-Up: At [facility], you and your health needs are our priority.  As part of our continuing mission to provide you with exceptional heart care, we have created designated Provider Care Teams.  These Care Teams include your primary Cardiologist (physician) and Advanced Practice Providers (APPs -  Physician Assistants and Nurse Practitioners) who all work together to provide you with the care you need, when you need it.  We recommend signing up for the patient portal called "MyChart".  Sign up information is provided on this After Visit Summary.  MyChart is used to connect with patients for Virtual Visits (Telemedicine).  Patients are able to view lab/test results, encounter notes, upcoming appointments, etc.  Non-urgent messages can be sent to your provider as well.   To learn more about what you can do with MyChart, go to [email or web address].    Your next appointment:   3 month(s)  The format for your next appointment:   In Person  Provider:   Buford Dresser, MD

## 2021-06-19 NOTE — Progress Notes (Signed)
Cardiology Office Note:    Date:  06/20/2021   ID:  Andrew Townsend, DOB 08/01/1950, MRN [id number]  PCP:  Lorrene Reid, PA-C  Cardiologist:  Buford Dresser, MD  Referring MD: Lorrene Reid, PA-C   CC: establish care, history of CAD  History of Present Illness:    Andrew Townsend is a 71 y.o. male with a hx of hypertension, hyperlipidemia, myocardial infarction, COPD, CKD, neuropathy, BPH, and gout, who is seen as a new consult at the request of Lorrene Reid, PA-C for the evaluation and management of atherosclerosis. Previously followed by cardiology in [place], [place].  Notes from Lorrene Reid, PA-C reviewed. At his 05/23/21 visit he noted becoming more SOB with activity. He was referred to cardiology to establish local specialist care since moving from [facility], [place]. In 03/2021 he had been hospitalized with pneumonia and acute kidney injury.  Cardiovascular risk factors: Prior clinical ASCVD: MI s/p angioplasty. ICD in place. Has never felt his device activate. He is not sure when exactly his ICD was placed. Additionally, he confirms having a renal arterial stent. Comorbid conditions: Hypertension, Hyperlipidemia, Prior cardiac testing and/or incidental findings on other testing (ie coronary calcium):  Exercise level: Chronic back pain, followed with orthopedics and was determined to not be a candidate for surgery. His pain has been limiting his ADLs. One year ago he used to be able to walk for exercise around the park or golf course. Lately his pain also radiates to his LE, he has to limit how much time he stands. He endorses remote falls, but none recently. Ten years ago he underwent bilateral rotator cuff repair. Also had right shoulder arthroscopy. Current diet:  Overall, he has been feeling okay lately. He has recovered well from his pneumonia (03/2021).  He also becomes short of breath with activity, but denies any chest pain. In the past 8 months his shortness of breath  has become more noticeable and seems to be worsening.  For the past week his lower extremities have been persistently swollen. This is not typical for him. Usually his edema resolves by the next day. Generally he sits in a recliner reading, and has not noticed if elevation will improve his swelling. At night he sleeps on his side. Previously he was on HCTZ.   Last week he was started on oxybutynin by his urologist for nocturia.  Previously he had transient global amnesia. He was unaware of where he was or where he lived. It was believed this would be a one-time, remote event.  He denies any palpitations, lightheadedness, headaches, syncope, orthopnea, or PND.  Past Medical History:  Diagnosis Date   CAD (coronary artery disease)    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)     History reviewed. No pertinent surgical history.  Current Medications: Current Outpatient Medications on File Prior to Visit  Medication Sig   albuterol (PROVENTIL) (2.5 MG/3ML) 0.083% nebulizer solution Take 3 mLs (2.5 mg total) by nebulization every 6 (six) hours as needed for wheezing or shortness of breath.   allopurinol (ZYLOPRIM) 100 MG tablet Take 100 mg by mouth daily.   amLODipine (NORVASC) 2.5 MG tablet Take 2.5 mg by mouth daily.   aspirin 81 MG EC tablet Take by mouth.   buprenorphine (BUTRANS) 15 MCG/HR SMARTSIG:Patch(s) Topical Once a Week   ezetimibe (ZETIA) 10 MG tablet Take by mouth.   gabapentin (NEURONTIN) 300 MG capsule Take 1 capsule (300 mg total) by mouth at bedtime.   metoprolol succinate (TOPROL-XL) 100 MG 24  hr tablet Take by mouth.   nitroGLYCERIN (NITROSTAT) 0.4 MG SL tablet    oxybutynin (DITROPAN-XL) 10 MG 24 hr tablet Take 1 tablet (10 mg total) by mouth daily.   oxyCODONE-acetaminophen (PERCOCET) 10-325 MG tablet Take by mouth.   pantoprazole (PROTONIX) 40 MG tablet Take 40 mg by mouth daily.   Potassium Chloride ER 20 MEQ TBCR Take 1 tablet by  mouth daily.   rosuvastatin (CRESTOR) 40 MG tablet Take 40 mg by mouth at bedtime.   STIOLTO RESPIMAT 2.5-2.5 MCG/ACT AERS INHALE 2 PUFFS INTO THE LUNGS DAILY.   terazosin (HYTRIN) 10 MG capsule Take 10 mg by mouth at bedtime.   tiZANidine (ZANAFLEX) 4 MG tablet Take 4 mg by mouth every 8 (eight) hours as needed.   zolpidem (AMBIEN) 5 MG tablet Take 5 mg by mouth at bedtime as needed.   No current facility-administered medications on file prior to visit.     Allergies:   Patient has no known allergies.   Social History   Tobacco Use   Smoking status: Former    Types: Cigarettes   Smokeless tobacco: Never  Vaping Use   Vaping Use: Some days  Substance Use Topics   Drug use: Never    Family History: No significant family history of heart disease  ROS:   Please see the history of present illness.  Additional pertinent ROS: Constitutional: Negative for chills, fever, night sweats, unintentional weight loss  HENT: Negative for ear pain and hearing loss.   Eyes: Negative for loss of vision and eye pain.  Respiratory: Negative for cough, sputum, wheezing. Positive for shortness of breath.   Cardiovascular: See HPI. Gastrointestinal: Negative for abdominal pain, melena, and hematochezia.  Genitourinary: Negative for dysuria and hematuria.  Musculoskeletal: Negative for recent falls. Positive for chronic back pain, radiating distally to bilateral LE.  Skin: Negative for itching and rash.  Neurological: Negative for focal weakness, focal sensory changes and loss of consciousness.  Endo/Heme/Allergies: Does not bruise/bleed easily.     EKGs/Labs/Other Studies Reviewed:    The following studies were reviewed today:  CT Chest 05/17/2021: FINDINGS: Cardiovascular: Heart is upper limits of normal in size. No pericardial effusion. Three-vessel coronary artery calcifications status post CABG. Atherosclerotic disease of the thoracic aorta. Left chest wall ICD with leads positioned in  the right ventricle.   Mediastinum/Nodes: No pathologically enlarged lymph nodes seen in the chest. Small hiatal hernia.   Lungs/Pleura: Central airways are patent. Bilateral bronchial wall thickening. Bilateral consolidations with adjacent nodularity, most pronounced in the right upper lobe. No pleural effusion or pneumothorax.   Upper Abdomen: No acute abnormality.   Musculoskeletal: Prior right shoulder arthroplasty.   IMPRESSION: 1. Bilateral consolidations with adjacent nodularity, most pronounced in the posterior right upper lobe, findings are likely sequela of infection or aspiration. Follow-up chest CT is recommended in 2-3 months to ensure resolution. 2. Bilateral bronchial wall thickening, findings can be seen in the setting of bronchitis. 3. Aortic Atherosclerosis (ICD10-I70.0) and Emphysema (ICD10-J43.9).  Echo 03/27/2021 ([place]): Left Ventricle:  The left ventricular chamber size is normal. There is no left  ventricular hypertrophy. There is mildly decreased left ventricular  systolic function. The estimated ejection fraction is 45-50%.  There is  abnormal ventricular septal wall motion consistent with right  ventricular pacemaker.  Abnormal left ventricular diastolic function is  observed.   Left Atrium:  The left atrial chamber size is normal.   Right Ventricle:  The right ventricular cavity size is normal.  The right ventricular  global systolic function is normal. A pacemaker wire is visualized in  the right ventricle.   Right Atrium:  The right atrial cavity size is normal.   Aortic Valve:  The aortic valve is trileaflet. There is no evidence of aortic stenosis.  There is no evidence of aortic regurgitation.   Mitral Valve:  The mitral valve leaflets appear normal. There is no evidence of mitral  stenosis. There is a trace of mitral regurgitation.   Tricuspid Valve:  There is a trace tricuspid regurgitation.     Pulmonic  Valve:  The pulmonic valve appears normal in structure and function.   Pericardium:  The pericardium appears normal.   Aorta:  The aorta appears normal.     Pulmonary Artery:  The main pulmonary artery appears normal.   Conclusions:                Mildly depressed LV function.  Maybe inferior base hypokinesis but not  seen well.  No significant valve disease.   Cardiac Catheterization 08/05/2020 ([facility]): Findings:  --LM: Distal LM has a 95% stenosis  --LAD: The proximal LAD has diffuse moderate-severe disease. The mid LAD is 100% occluded. The LIMA-LAD is patent. The distal LAD has mild diffuse disease. The SVG-diagonal is known to be occluded and was not injected.  --LCx: The proximal LCx is 100% occluded. The SVG-OM is widely patent. The distal OM territory it serves has only minimal luminal disease.  --RCA: Not engaged. Known to be 100% occluded in the proximal segment. The SVG-PDA is patent but has TIMI II flow. There are R-L collaterals.   Intervention: none   EKG:  EKG is personally reviewed.   06/19/2021: NSR, IVCB at 69 bpm  Recent Labs: 05/23/2021: ALT 39; BNP 124.6; BUN 19; Creatinine, Ser 2.09; Hemoglobin 12.4; Platelets 291; Potassium 4.8; Sodium 140; TSH 4.020   Recent Lipid Panel No results found for: CHOL, TRIG, HDL, CHOLHDL, VLDL, LDLCALC, LDLDIRECT  Physical Exam:    VS:  BP 110/64    Pulse 69    Ht 5\' 7"  (1.702 m)    Wt 131 lb 9.6 oz (59.7 kg)    SpO2 96%    BMI 20.61 kg/m     Wt Readings from Last 3 Encounters:  06/19/21 131 lb 9.6 oz (59.7 kg)  06/07/21 125 lb (56.7 kg)  05/23/21 118 lb (53.5 kg)    GEN: Well nourished, well developed in no acute distress HEENT: Normal, moist mucous membranes NECK: No JVD CARDIAC: regular rhythm, normal S1 and S2, no rubs or gallops. No murmur. VASCULAR: Radial and DP pulses 2+ bilaterally. No carotid bruits RESPIRATORY:  Clear to auscultation without rales, wheezing or rhonchi  ABDOMEN: Soft,  non-tender, non-distended MUSCULOSKELETAL:  Ambulates independently SKIN: Warm and dry, 1+ bilateral LE pitting edema NEUROLOGIC:  Alert and oriented x 3. No focal neuro deficits noted. PSYCHIATRIC:  Normal affect    ASSESSMENT:    1. 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)   2. Bilateral leg edema   3. Stage 3b chronic kidney disease ([place])   4. Coronary artery disease involving native coronary artery of native heart without angina pectoris   5. Renal artery stenosis ([place])   6. Chronic combined systolic and diastolic congestive heart failure ([place])   7. ICD (implantable cardioverter-defibrillator) in place   8. Dyspnea on exertion    PLAN:    CAD with prior 4V CABG Ischemic cardiomyopathy Chronic systolic and diastolic heart failure  Shortness of breath, lower extremity edema -no JVD, but edema is new for him -will trial furosemide 40 mg daily. If he does not respond to this dose, may need 80 mg dose given his CKD -on amlodipine, ?anti anginal as BP normal to low -continue metoprolol succinate -continue aspirin -on ezetimibe, rosuvastatin  Stage 3b chronic kidney disease Renal artery stenosis, right, s/p right renal artery stent -has established with urology, has not yet established with nephrology since moving  History of VT, EP study 06/28/2016 S/P ICD -will refer to device clinic to establish care  Cardiac risk counseling and prevention recommendations: -recommend heart healthy/Mediterranean diet, with whole grains, fruits, vegetable, fish, lean meats, nuts, and olive oil. Limit salt. -recommend moderate walking, 3-5 times/week for 30-50 minutes each session. Aim for at least 150 minutes.week. Goal should be pace of 3 miles/hours, or walking 1.5 miles in 30 minutes -recommend avoidance of tobacco products. Avoid excess alcohol. -ASCVD risk score: The ASCVD Risk score (Arnett DK, et al., 2019) failed to calculate for the following reasons:    Cannot find a previous HDL lab   Cannot find a previous total cholesterol lab    Plan for follow up: 3 months or sooner as needed.  Buford Dresser, MD, PhD, [facility]    Medication Adjustments/Labs and Tests Ordered: Current medicines are reviewed at length with the patient today.  Concerns regarding medicines are outlined above.   Orders Placed This Encounter  Procedures   Ambulatory referral to Cardiac Electrophysiology   EKG 12-Lead   Meds ordered this encounter  Medications   furosemide (LASIX) 40 MG tablet    Sig: Take 1 tablet (40 mg total) by mouth daily as needed for fluid.    Dispense:  90 tablet    Refill:  3   Patient Instructions  Medication Instructions:  Try 40 mg furosemide once a day in the morning. If you don't make a lot of urine within a few hours, the next day take 80 mg in the morning. I expect you will need to do this for a few days. If the swelling does not improve in a few days, call me.  *If you need a refill on your cardiac medications before your next appointment, please call your pharmacy*   Lab Work: None ordered today   Testing/Procedures: None ordered today   Follow-Up: At [facility], you and your health needs are our priority.  As part of our continuing mission to provide you with exceptional heart care, we have created designated Provider Care Teams.  These Care Teams include your primary Cardiologist (physician) and Advanced Practice Providers (APPs -  Physician Assistants and Nurse Practitioners) who all work together to provide you with the care you need, when you need it.  We recommend signing up for the patient portal called "MyChart".  Sign up information is provided on this After Visit Summary.  MyChart is used to connect with patients for Virtual Visits (Telemedicine).  Patients are able to view lab/test results, encounter notes, upcoming appointments, etc.  Non-urgent messages can be sent to your  provider as well.   To learn more about what you can do with MyChart, go to [email or web address].    Your next appointment:   3 month(s)  The format for your next appointment:   In Person  Provider:   Buford Dresser, MD       [facility],acting as a scribe for Buford Dresser, MD.,have documented all relevant documentation on the  behalf of Buford Dresser, MD,as directed by  Buford Dresser, MD while in the presence of Buford Dresser, MD.  I, Buford Dresser, MD, have reviewed all documentation for this visit. The documentation on 06/20/21 for the exam, diagnosis, procedures, and orders are all accurate and complete.   Signed, Buford Dresser, MD PhD 06/20/2021 12:03 PM    [place]

## 2021-07-19 ENCOUNTER — Ambulatory Visit: Payer: Medicare PPO | Admitting: Urology

## 2021-07-19 ENCOUNTER — Other Ambulatory Visit: Payer: Self-pay

## 2021-07-19 ENCOUNTER — Encounter: Payer: Self-pay | Admitting: Urology

## 2021-07-19 VITALS — BP 96/61 | HR 67 | Ht 67.0 in | Wt 126.3 lb

## 2021-07-19 DIAGNOSIS — N401 Enlarged prostate with lower urinary tract symptoms: Secondary | ICD-10-CM

## 2021-07-19 DIAGNOSIS — N3281 Overactive bladder: Secondary | ICD-10-CM | POA: Diagnosis not present

## 2021-07-19 DIAGNOSIS — N138 Other obstructive and reflux uropathy: Secondary | ICD-10-CM

## 2021-07-19 LAB — BLADDER SCAN AMB NON-IMAGING

## 2021-07-19 NOTE — Progress Notes (Signed)
? ?  07/19/2021 ?11:02 AM  ? ?Andrew Townsend ?18-Feb-1951 ?[id number] ? ?Reason for visit: Follow up BPH/OAB ? ?HPI: ?71 year old male with a number of medical problems including hypertension, CAD, COPD, CKD, BPH who I originally saw on 06/07/2021 when he reported 6 to 12 months of worsening urinary symptoms including urgency and frequency during the day with some urge incontinence, as well as nocturia 4 times overnight.  ? ?Urinalysis was benign, PVR was normal at 42 mL, renal ultrasound in November 2022 was normal.  We opted for a trial of oxybutynin 10 mg XL, and behavioral strategies were discussed.  He had been on terazosin for BPH at baseline. ? ?He also was recently seen by his cardiologist and started on Lasix as needed for lower extremity edema. ? ?He reports overall significant improvement in his urinary symptoms on the oxybutynin.  His nocturia has improved to just twice overnight, and his urge incontinence has resolved.  IPSS score today is 9, with quality of life mostly satisfied, and PVR remains normal at 65 mL.  I recommended continuing the oxybutynin and stopping the terazosin to see if he notices any change in the urinary symptoms, certainly if he has a weakened stream more increased difficulty voiding can resume the terazosin.  We also reviewed the impact of Lasix on his urinary symptoms, and I encouraged him to take this in the mornings or early afternoons to avoid significant nocturia. ? ?Continue oxybutynin 10 mg XL daily ?Trial off of the terazosin, okay to resume if worsened urinary symptoms ?RTC 1 year IPSS, PVR ? ?Billey Co, MD ? ?[facility] ?[address], [address] ?[place], [place] [address] ?[phone] ? ?

## 2021-07-20 ENCOUNTER — Other Ambulatory Visit: Payer: Medicare PPO

## 2021-07-20 ENCOUNTER — Other Ambulatory Visit: Payer: Self-pay

## 2021-07-20 DIAGNOSIS — N1831 Chronic kidney disease, stage 3a: Secondary | ICD-10-CM

## 2021-07-21 LAB — COMPREHENSIVE METABOLIC PANEL
ALT: 16 IU/L (ref 0–44)
AST: 30 IU/L (ref 0–40)
Albumin/Globulin Ratio: 1.8 (ref 1.2–2.2)
Albumin: 4.4 g/dL (ref 3.7–4.7)
Alkaline Phosphatase: 92 IU/L (ref 44–121)
BUN/Creatinine Ratio: 13 (ref 10–24)
BUN: 22 mg/dL (ref 8–27)
Bilirubin Total: 0.7 mg/dL (ref 0.0–1.2)
CO2: 16 mmol/L — ABNORMAL LOW (ref 20–29)
Calcium: 10.4 mg/dL — ABNORMAL HIGH (ref 8.6–10.2)
Chloride: 104 mmol/L (ref 96–106)
Creatinine, Ser: 1.69 mg/dL — ABNORMAL HIGH (ref 0.76–1.27)
Globulin, Total: 2.5 g/dL (ref 1.5–4.5)
Glucose: 94 mg/dL (ref 70–99)
Potassium: 4.4 mmol/L (ref 3.5–5.2)
Sodium: 141 mmol/L (ref 134–144)
Total Protein: 6.9 g/dL (ref 6.0–8.5)
eGFR: 43 mL/min/{1.73_m2} — ABNORMAL LOW (ref >59)

## 2021-07-24 ENCOUNTER — Encounter: Payer: Medicare PPO | Admitting: Internal Medicine

## 2021-07-31 ENCOUNTER — Other Ambulatory Visit: Payer: Self-pay

## 2021-07-31 ENCOUNTER — Ambulatory Visit (HOSPITAL_BASED_OUTPATIENT_CLINIC_OR_DEPARTMENT_OTHER): Payer: Medicare PPO | Admitting: Orthopaedic Surgery

## 2021-07-31 DIAGNOSIS — M19012 Primary osteoarthritis, left shoulder: Secondary | ICD-10-CM

## 2021-07-31 NOTE — Progress Notes (Signed)
? ?                            ? ? ?Chief Complaint: Left shoulder pain, lower back pain ?  ? ? ?History of Present Illness:  ? ?07/31/2021: Presents today for follow-up of his bilateral shoulders.  Overall his right shoulder does feel much better.  He has states that he is going back and forth whether or not he would like to have his left shoulder operated on as he does have known end-stage glenohumeral osteoarthritis on the side.  Overall he is reassured that his right shoulder continues to feel better with a home therapy program. ? ? ?Andrew Townsend is a 71 y.o. male right-hand-dominant male presents with ongoing left shoulder pain and limited range of motion for multiple years.  He previously had a right shoulder arthroplasty done in March 2022 by Dr. Gunnar Bulla.  He is has had a history of bilateral rotator cuff repairs done many years prior he does not specifically remember when.  He believes that the left shoulder is nonpainful as he is compensating for the recovery on his right shoulder.  He has not been completely happy with the recovery and rehab on the right shoulder.  He is here today for further advice regarding this.  He has had multiple injections on the left shoulder the most recent 1 done in summer 2020 did not help at all.  This is achy and limited.  Regard to his lower back pain he has had multiple MRIs and ultimately recommended for lumbar fusion although he is unwilling to do this.  He is quite concerned about the possibility of undergoing lumbar surgery. ? ? ? ?Surgical History:   ?None ? ?PMH/PSH/Family History/Social History/Meds/Allergies:   ? ?Past Medical History:  ?Diagnosis Date  ? CAD (coronary artery disease)   ? 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 ?No past surgical history on file. ?Social History  ? ?Socioeconomic History  ? Marital status: Widowed  ?  Spouse name: Not on file  ? Number of children: Not on file  ? Years of education: Not on file  ?  Highest education level: Not on file  ?Occupational History  ? Not on file  ?Tobacco Use  ? Smoking status: Former  ?  Types: Cigarettes  ? Smokeless tobacco: Never  ?Vaping Use  ? Vaping Use: Some days  ?Substance and Sexual Activity  ? Alcohol use: Not on file  ? Drug use: Never  ? Sexual activity: Not Currently  ?Other Topics Concern  ? Not on file  ?Social History Narrative  ? Not on file  ? ?Social Determinants of Health  ? ?Financial Resource Strain: Not on file  ?Food Insecurity: Not on file  ?Transportation Needs: Not on file  ?Physical Activity: Not on file  ?Stress: Not on file  ?Social Connections: Not on file  ? ?No family history on file. ?No Known Allergies ?Current Outpatient Medications  ?Medication Sig Dispense Refill  ? albuterol (PROVENTIL) (2.5 MG/3ML) 0.083% nebulizer solution Take 3 mLs (2.5 mg total) by nebulization every 6 (six) hours as needed for wheezing or shortness of breath. 150 mL 0  ? allopurinol (ZYLOPRIM) 100 MG tablet Take 100 mg by mouth daily.    ? amLODipine (NORVASC) 2.5 MG tablet Take 2.5 mg by mouth daily.    ? aspirin 81 MG EC tablet Take by mouth.    ? buprenorphine (BUTRANS) 15 MCG/HR  SMARTSIG:Patch(s) Topical Once a Week    ? ezetimibe (ZETIA) 10 MG tablet Take by mouth.    ? furosemide (LASIX) 40 MG tablet Take 1 tablet (40 mg total) by mouth daily as needed for fluid. 90 tablet 3  ? gabapentin (NEURONTIN) 300 MG capsule Take 1 capsule (300 mg total) by mouth at bedtime. 90 capsule 1  ? metoprolol succinate (TOPROL-XL) 100 MG 24 hr tablet Take by mouth.    ? nitroGLYCERIN (NITROSTAT) 0.4 MG SL tablet     ? oxybutynin (DITROPAN-XL) 10 MG 24 hr tablet Take 1 tablet (10 mg total) by mouth daily. 30 tablet 11  ? oxyCODONE-acetaminophen (PERCOCET) 10-325 MG tablet Take by mouth.    ? pantoprazole (PROTONIX) 40 MG tablet Take 40 mg by mouth daily.    ? Potassium Chloride ER 20 MEQ TBCR Take 1 tablet by mouth daily.    ? rosuvastatin (CRESTOR) 40 MG tablet Take 40 mg by mouth  at bedtime.    ? STIOLTO RESPIMAT 2.5-2.5 MCG/ACT AERS INHALE 2 PUFFS INTO THE LUNGS DAILY. 4 g 2  ? tiZANidine (ZANAFLEX) 4 MG tablet Take 4 mg by mouth every 8 (eight) hours as needed.    ? zolpidem (AMBIEN) 5 MG tablet Take 5 mg by mouth at bedtime as needed.    ? ?No current facility-administered medications for this visit.  ? ?No results found. ? ?Review of Systems:   ?A ROS was performed including pertinent positives and negatives as documented in the HPI. ? ?Physical Exam :   ?Constitutional: NAD and appears stated age ?Neurological: Alert and oriented ?Psych: Appropriate affect and cooperative ?There were no vitals taken for this visit.  ? ?Comprehensive Musculoskeletal Exam:   ? ?Musculoskeletal Exam    ?Inspection Right Left  ?Skin No atrophy or winging No atrophy or winging  ?Palpation    ?Tenderness None Left glenohumeral  ?Range of Motion    ?Flexion (passive) 120 70  ?Flexion (active) 120 70  ?Abduction 100 50  ?ER at the side 45 10  ?Can reach behind back to L3 L3  ?Strength    ? Limited Limited  ?Special Tests    ?Pseudoparalytic No No  ?Neurologic    ?Fires PIN, radial, median, ulnar, musculocutaneous, axillary, suprascapular, long thoracic, and spinal accessory innervated muscles. No abnormal sensibility  ?Vascular/Lymphatic    ?Radial Pulse 2+ 2+  ?Cervical Exam    ?Patient has symmetric cervical range of motion with negative Spurling's test.  ?Special Test:   ? ? ? ? ?Imaging:   ?Xray (\3 views left shoulder): ?Advanced glenohumeral osteoarthritis ? ? ?I personally reviewed and interpreted the radiographs. ? ? ?Assessment:   ?71 year old male with left advanced glenohumeral osteoarthritis which is failed conservative management.  Injections no longer been helpful for him.  At this point I do believe that at some point he would be a candidate for left shoulder arthroplasty although that being said he continues to make significant improvement on his right shoulder.  I do believe that he would  benefit from having a stronger right shoulder with improved range of motion prior to doing any type of arthroplasty on the contralateral side.  This time he will plan to come to our facility for range of motion and strengthening about the right shoulder.  I will see him back in 2 months to reassess his outcome ? ?-Return to clinic in 8 weeks for further discussion ? ? ? ? ?I personally saw and evaluated the patient, and participated in  the management and treatment plan. ? ?Vanetta Mulders, MD ?Attending Physician, Orthopedic Surgery ? ?This document was dictated using Systems analyst. A reasonable attempt at proof reading has been made to minimize errors. ?

## 2021-08-18 ENCOUNTER — Encounter (HOSPITAL_COMMUNITY): Payer: Self-pay | Admitting: Radiology

## 2021-08-18 ENCOUNTER — Other Ambulatory Visit: Payer: Self-pay | Admitting: Physician Assistant

## 2021-08-18 DIAGNOSIS — J449 Chronic obstructive pulmonary disease, unspecified: Secondary | ICD-10-CM

## 2021-08-28 ENCOUNTER — Other Ambulatory Visit: Payer: Self-pay

## 2021-08-28 ENCOUNTER — Telehealth: Payer: Self-pay | Admitting: Physician Assistant

## 2021-08-28 MED ORDER — TIZANIDINE HCL 4 MG PO TABS: 4.0000 mg | ORAL_TABLET | Freq: Three times a day (TID) | ORAL | 2 refills | Status: DC | PRN

## 2021-08-28 NOTE — Telephone Encounter (Signed)
Patient is requesting a refill of Ambien and Tizanididne. Please advise. [phone] ?

## 2021-08-29 ENCOUNTER — Other Ambulatory Visit: Payer: Self-pay | Admitting: Physician Assistant

## 2021-08-29 DIAGNOSIS — G4700 Insomnia, unspecified: Secondary | ICD-10-CM

## 2021-08-29 MED ORDER — ZOLPIDEM TARTRATE 5 MG PO TABS: 5.0000 mg | ORAL_TABLET | Freq: Every evening | ORAL | 0 refills | Status: DC | PRN

## 2021-09-12 ENCOUNTER — Ambulatory Visit: Payer: Medicare PPO | Admitting: Internal Medicine

## 2021-09-12 ENCOUNTER — Encounter: Payer: Self-pay | Admitting: Internal Medicine

## 2021-09-12 ENCOUNTER — Telehealth: Payer: Self-pay

## 2021-09-12 VITALS — BP 132/70 | HR 68 | Ht 66.0 in | Wt 131.4 lb

## 2021-09-12 DIAGNOSIS — Z95810 Presence of automatic (implantable) cardiac defibrillator: Secondary | ICD-10-CM

## 2021-09-12 DIAGNOSIS — I255 Ischemic cardiomyopathy: Secondary | ICD-10-CM

## 2021-09-12 DIAGNOSIS — I420 Dilated cardiomyopathy: Secondary | ICD-10-CM | POA: Diagnosis not present

## 2021-09-12 MED ORDER — APIXABAN 2.5 MG PO TABS: 2.5000 mg | ORAL_TABLET | Freq: Two times a day (BID) | ORAL | 11 refills | Status: DC

## 2021-09-12 NOTE — Progress Notes (Signed)
? ? ? ? ?HPI ?Andrew Townsend is referred today by Dr. Harrell Gave for evaluation of ICM, s/p ICD insertion. He has moved from the He has 3 vessel CAD, s/p CABG with mild/mod LV dysfunction s/p ICD insertion. He has class 2 CHF. He has chronic renal insuff, stage 3. He has not had palpitations, chest pain or sob.  ?No Known Allergies ? ? ?Current Outpatient Medications  ?Medication Sig Dispense Refill  ? albuterol (PROVENTIL) (2.5 MG/3ML) 0.083% nebulizer solution INHALE THE CONTENTS OF 1 VIAL VIA NEBULIZER EVERY 6 HOURS AS NEEDED FOR WHEEZING OR SHORTNESS OF BREATH. 150 mL 0  ? allopurinol (ZYLOPRIM) 100 MG tablet Take 100 mg by mouth daily.    ? amLODipine (NORVASC) 2.5 MG tablet Take 2.5 mg by mouth daily.    ? aspirin 81 MG EC tablet Take by mouth.    ? buprenorphine (BUTRANS) 15 MCG/HR SMARTSIG:Patch(s) Topical Once a Week    ? ezetimibe (ZETIA) 10 MG tablet Take by mouth.    ? furosemide (LASIX) 40 MG tablet Take 1 tablet (40 mg total) by mouth daily as needed for fluid. 90 tablet 3  ? gabapentin (NEURONTIN) 300 MG capsule Take 1 capsule (300 mg total) by mouth at bedtime. 90 capsule 1  ? metoprolol succinate (TOPROL-XL) 100 MG 24 hr tablet Take by mouth.    ? nitroGLYCERIN (NITROSTAT) 0.4 MG SL tablet     ? oxybutynin (DITROPAN-XL) 10 MG 24 hr tablet Take 1 tablet (10 mg total) by mouth daily. 30 tablet 11  ? oxyCODONE-acetaminophen (PERCOCET) 10-325 MG tablet Take by mouth.    ? pantoprazole (PROTONIX) 40 MG tablet Take 40 mg by mouth daily.    ? Potassium Chloride ER 20 MEQ TBCR Take 1 tablet by mouth daily.    ? rosuvastatin (CRESTOR) 40 MG tablet Take 40 mg by mouth at bedtime.    ? STIOLTO RESPIMAT 2.5-2.5 MCG/ACT AERS INHALE 2 PUFFS INTO THE LUNGS DAILY. 4 g 2  ? tiZANidine (ZANAFLEX) 4 MG tablet Take 1 tablet (4 mg total) by mouth every 8 (eight) hours as needed. 30 tablet 2  ? zolpidem (AMBIEN) 5 MG tablet Take 1 tablet (5 mg total) by mouth at bedtime as needed. 90 tablet 0  ? ?No current  facility-administered medications for this visit.  ? ? ? ?Past Medical History:  ?Diagnosis Date  ? CAD (coronary artery disease)   ? 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 ? ?ROS: ? ? All systems reviewed and negative except as noted in the HPI. ? ? ?History reviewed. No pertinent surgical history. ? ? ?History reviewed. No pertinent family history. ? ? ?Social History  ? ?Socioeconomic History  ? Marital status: Widowed  ?  Spouse name: Not on file  ? Number of children: Not on file  ? Years of education: Not on file  ? Highest education level: Not on file  ?Occupational History  ? Not on file  ?Tobacco Use  ? Smoking status: Former  ?  Types: Cigarettes  ? Smokeless tobacco: Never  ?Vaping Use  ? Vaping Use: Some days  ?Substance and Sexual Activity  ? Alcohol use: Not on file  ? Drug use: Never  ? Sexual activity: Not Currently  ?Other Topics Concern  ? Not on file  ?Social History Narrative  ? Not on file  ? ?Social Determinants of Health  ? ?Financial Resource Strain: Not on file  ?Food Insecurity: Not on file  ?Transportation Needs: Not on file  ?  Physical Activity: Not on file  ?Stress: Not on file  ?Social Connections: Not on file  ?Intimate Partner Violence: Not on file  ? ? ? ?BP 132/70   Pulse 68   Ht 5\' 6"  (1.676 m)   Wt 131 lb 6.4 oz (59.6 kg)   SpO2 97%   BMI 21.21 kg/m?  ? ?Physical Exam: ? ?Well appearing NAD ?HEENT: Unremarkable ?Neck:  No JVD, no thyromegally ?Lymphatics:  No adenopathy ?Back:  No CVA tenderness ?Lungs:  Clear with no wheezes ?HEART:  Regular rate rhythm, no murmurs, no rubs, no clicks ?Abd:  soft, positive bowel sounds, no organomegally, no rebound, no guarding ?Ext:  2 plus pulses, no edema, no cyanosis, no clubbing ?Skin:  No rashes no nodules ?Neuro:  CN II through XII intact, motor grossly intact ? ?DEVICE  ?Normal device function.  See PaceArt for details.  ? ?Assess/Plan:  ?ICM - He is s/p CABG and denies anginal symptoms. No  change in his meds. ?ICD - he denies any ICD shocks. His medtronic single chamber ICD is working normally.  ?Chronic systolic heart failure - He has class 2 symptoms. Continue medical therapy. I'll defer ACE/ARB vs hydralazine/nitrates to Dr. Harrell Gave. ?PAF - interrogation of his ICD demonstrates PAF. I have recommended Eliquis and we will start 2.5 mg twice daily. He will stop the ASA. ? ?Andrew Townsend Andrew Saidi,MD ?

## 2021-09-12 NOTE — Telephone Encounter (Signed)
Patient is new patient of Dr. Ladona Ridgel and I  requested in [facility] to release patient into our clinic for remote monitoring. Called the office below and no answer, LMTCB. ? ? ?

## 2021-09-12 NOTE — Patient Instructions (Addendum)
Medication Instructions:  ?Your physician has recommended you make the following change in your medication:  ? ? STOP Aspirin ? ? START taking Eliquis 2.5 mg-  Take one tablet by mouth twice a day ? ?Labwork: ?None ordered. ? ?Testing/Procedures: ?None ordered. ? ?Follow-Up: ?Your physician wants you to follow-up in: one year with Lewayne Bunting, MD or one of the following Advanced Practice Providers on your designated Care Team:   ?Francis Dowse, PA-C ?Casimiro Needle "Mardelle Matte" , PA-C ? ?Remote monitoring is used to monitor your ICD from home.  ? ?We will get you set up with our device clinic.  [phone] ? ?Any Other Special Instructions Will Be Listed Below (If Applicable). ? ?If you need a refill on your cardiac medications before your next appointment, please call your pharmacy.  ? ?Important Information About Sugar ? ? ? ? ? ?Apixaban Tablets ?What is this medication? ?APIXABAN (a PIX a ban) prevents or treats blood clots. It is also used to lower the risk of stroke in people with AFib (atrial fibrillation). It belongs to a group of medications called blood thinners. ?This medicine may be used for other purposes; ask your health care provider or pharmacist if you have questions. ?COMMON BRAND NAME(S): Eliquis ?What should I tell my care team before I take this medication? ?They need to know if you have any of these conditions: ?Antiphospholipid antibody syndrome ?Bleeding disorder ?History of bleeding in the brain ?History of blood clots ?History of stomach bleeding ?Kidney disease ?Liver disease ?Mechanical heart valve ?Spinal surgery ?An unusual or allergic reaction to apixaban, other medications, foods, dyes, or preservatives ?Pregnant or trying to get pregnant ?Breast-feeding ?How should I use this medication? ?Take this medication by mouth. For your therapy to work as well as possible, take each dose exactly as prescribed on the prescription label. Do not skip doses. Skipping doses or stopping this medication  can increase your risk of a blood clot or stroke. Keep taking this medication unless your care team tells you to stop. Take it as directed on the prescription label at the same time every day. You can take it with or without food. If it upsets your stomach, take it with food. ?A special MedGuide will be given to you by the pharmacist with each prescription and refill. Be sure to read this information carefully each time. ?Talk to your care team about the use of this medication in children. Special care may be needed. ?Overdosage: If you think you have taken too much of this medicine contact a poison control center or emergency room at once. ?NOTE: This medicine is only for you. Do not share this medicine with others. ?What if I miss a dose? ?If you miss a dose, take it as soon as you can. If it is almost time for your next dose, take only that dose. Do not take double or extra doses. ?What may interact with this medication? ?This medication may interact with the following: ?Aspirin and aspirin-like medications ?Certain medications for fungal infections like itraconazole and ketoconazole ?Certain medications for seizures like carbamazepine and phenytoin ?Certain medications for blood clots like enoxaparin, dalteparin, heparin, and warfarin ?Clarithromycin ?NSAIDs, medications for pain and inflammation, like ibuprofen or naproxen ?Rifampin ?Ritonavir ?St. John's wort ?This list may not describe all possible interactions. Give your health care provider a list of all the medicines, herbs, non-prescription drugs, or dietary supplements you use. Also tell them if you smoke, drink alcohol, or use illegal drugs. Some items may interact with your medicine. ?  What should I watch for while using this medication? ?Visit your healthcare professional for regular checks on your progress. You may need blood work done while you are taking this medication. Your condition will be monitored carefully while you are receiving this  medication. It is important not to miss any appointments. ?Avoid sports and activities that might cause injury while you are using this medication. Severe falls or injuries can cause unseen bleeding. Be careful when using sharp tools or knives. Consider using an Neurosurgeon. Take special care brushing or flossing your teeth. Report any injuries, bruising, or red spots on the skin to your healthcare professional. ?If you are going to need surgery or other procedure, tell your healthcare professional that you are taking this medication. ?Wear a medical ID bracelet or chain. Carry a card that describes your disease and details of your medication and dosage times. ?What side effects may I notice from receiving this medication? ?Side effects that you should report to your care team as soon as possible: ?Allergic reactions--skin rash, itching, hives, swelling of the face, lips, tongue, or throat ?Bleeding--bloody or black, tar-like stools, vomiting blood or brown material that looks like coffee grounds, red or dark brown urine, small red or purple spots on the skin, unusual bruising or bleeding ?Bleeding in the brain--severe headache, stiff neck, confusion, dizziness, change in vision, numbness or weakness of the face, arm, or leg, trouble speaking, trouble walking, vomiting ?Heavy periods ?This list may not describe all possible side effects. Call your doctor for medical advice about side effects. You may report side effects to FDA at 1-800-FDA-1088. ?Where should I keep my medication? ?Keep out of the reach of children and pets. ?Store at room temperature between 20 and 25 degrees C (68 and 77 degrees F). Get rid of any unused medication after the expiration date. ?To get rid of medications that are no longer needed or expired: ?Take the medication to a medication take-back program. Check with your pharmacy or law enforcement to find a location. ?If you cannot return the medication, check the label or package insert  to see if the medication should be thrown out in the garbage or flushed down the toilet. If you are not sure, ask your care team. If it is safe to put in the trash, empty the medication out of the container. Mix the medication with cat litter, dirt, coffee grounds, or other unwanted substance. Seal the mixture in a bag or container. Put it in the trash. ?NOTE: This sheet is a summary. It may not cover all possible information. If you have questions about this medicine, talk to your doctor, pharmacist, or health care provider. ?? 2023 Elsevier/Gold Standard (2020-06-03 00:00:00) ? ? ?

## 2021-09-13 ENCOUNTER — Ambulatory Visit (HOSPITAL_BASED_OUTPATIENT_CLINIC_OR_DEPARTMENT_OTHER): Payer: Medicare PPO | Attending: Orthopaedic Surgery | Admitting: Physical Therapy

## 2021-09-13 ENCOUNTER — Encounter (HOSPITAL_BASED_OUTPATIENT_CLINIC_OR_DEPARTMENT_OTHER): Payer: Self-pay | Admitting: Physical Therapy

## 2021-09-13 DIAGNOSIS — M25611 Stiffness of right shoulder, not elsewhere classified: Secondary | ICD-10-CM | POA: Diagnosis not present

## 2021-09-13 DIAGNOSIS — G8929 Other chronic pain: Secondary | ICD-10-CM | POA: Insufficient documentation

## 2021-09-13 DIAGNOSIS — M6281 Muscle weakness (generalized): Secondary | ICD-10-CM | POA: Insufficient documentation

## 2021-09-13 DIAGNOSIS — M19012 Primary osteoarthritis, left shoulder: Secondary | ICD-10-CM | POA: Diagnosis present

## 2021-09-13 DIAGNOSIS — M25512 Pain in left shoulder: Secondary | ICD-10-CM | POA: Diagnosis not present

## 2021-09-13 DIAGNOSIS — M25511 Pain in right shoulder: Secondary | ICD-10-CM | POA: Insufficient documentation

## 2021-09-13 DIAGNOSIS — M25612 Stiffness of left shoulder, not elsewhere classified: Secondary | ICD-10-CM | POA: Diagnosis not present

## 2021-09-13 NOTE — Therapy (Signed)
?OUTPATIENT PHYSICAL THERAPY SHOULDER EVALUATION ? ? ?Patient Name: Andrew Townsend ?MRN: [id number] ?DOB:Jan 13, 1951, 71 y.o., male ?Today's Date: 09/13/2021 ? ? PT End of Session - 09/13/21 1418   ? ? Visit Number 1   ? Number of Visits 15   ? Authorization Type [employer] MCR   ? PT Start Time 1430   ? PT Stop Time 1510   ? PT Time Calculation (min) 40 min   ? Activity Tolerance Patient tolerated treatment well;Patient limited by pain   ? Behavior During Therapy [facility] for tasks assessed/performed   ? ?  ?  ? ?  ? ? ?Past Medical History:  ?Diagnosis Date  ? CAD (coronary artery disease)   ? 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 ?History reviewed. No pertinent surgical history. ?Patient Active Problem List  ? Diagnosis Date Noted  ? 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 S/P CABG (coronary artery bypass graft) 05/26/2021  ? CAD (coronary artery disease) 05/26/2021  ? ICD (implantable cardioverter-defibrillator) in place 05/26/2021  ? COPD (chronic obstructive pulmonary disease) ([place]) 05/26/2021  ? Neuropathy 05/26/2021  ? Insomnia 05/26/2021  ? BPH (benign prostatic hyperplasia) 03/25/2021  ? Left rotator cuff tear 03/25/2021  ? RLL pneumonia 03/25/2021  ? Stage 3a chronic kidney disease ([place]) 06/16/2018  ? Hypertension 04/03/2018  ? Hyperlipidemia 04/03/2018  ? Acute kidney injury superimposed on chronic kidney disease ([place]) 04/03/2018  ? Anemia 04/03/2018  ? Chronic pain 04/03/2018  ? Chronic systolic congestive heart failure ([place]) 04/03/2018  ? Gout 04/03/2018  ? Renal artery stenosis ([place]) 04/03/2018  ? Thrombocytopenia ([place]) 04/03/2018  ? 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 VT (ventricular tachycardia) ([place]) 06/28/2016  ? Calculus of kidney 02/20/2016  ? ? ?PCP: Lorrene Reid, PA-C ? ?REFERRING PROVIDER: Vanetta Mulders, MD ? ?REFERRING DIAG: M19.012 (ICD-10-CM) - Osteoarthritis of glenohumeral joint, left ? ?THERAPY  DIAG:  ?Decreased right shoulder range of motion ? ?Decreased range of motion of left shoulder ? ?Muscle weakness (generalized) ? ?Chronic left shoulder pain ? ?Chronic right shoulder pain ? ? ?ONSET DATE: 15+ years ago ? ?SUBJECTIVE:                                                                                                                                                                                     ? ?SUBJECTIVE STATEMENT: ? ?Pt just moved here from [place].  ?Pt states he had RTC srugery on both shoulders. Pt states that the L shoulder is very hard to move. He can barely lift it. He is now having L elbow pain as well. Pt states he still has R shoulder aching. It will still  hurt with reaching beyond a certain point. He cannot lift very high to lift a plate on a shelf. Pt has some difficulty with dressing but that is due to the L shoulder. Pt avoids lots of activities during the day due to the pain. Pt is fairly sedentary now due to his long history of back pain. He has trouble just walking in the yard.  ? ?Pt is right-hand-dominant. Pt had a R TSA done in March 2022 by Dr. Gunnar Bulla.  He  has had multiple injections on the left shoulder the most recent 1 done in summer 2020 did not help. Pt denies cancer red flags.  ? ?PERTINENT HISTORY: ?R  reverseTSA 2022, COPD ? ?PAIN:  ?Are you having pain? Yes: NPRS scale: 2/10 ?Pain location: bilat shoulders ?Pain description: sharp, aching ?Aggravating factors: lifting, sitting with R shoulder too still ?Relieving factors: resting ? ?PRECAUTIONS: None ? ?WEIGHT BEARING RESTRICTIONS No ? ?FALLS:  ?Has patient fallen in last 6 months? No ? ?LIVING ENVIRONMENT: ?Lives with: lives with their family, sister ?Lives in: House/apartment ?Stairs: to enter ?Has following equipment at home:  has walking stick and walker available at home, does not use it from deceased wife ? ?OCCUPATION: ?retired ? ?PLOF: Independent with basic ADLs ? ?PATIENT GOALS : Get stronger for  surgery ? ?OBJECTIVE:  ? ?DIAGNOSTIC FINDINGS:  ?L Xray IMPRESSION: ?Advanced arthritis at the glenohumeral joint. High-riding humeral ?head consistent with rotator cuff disease ? ?R x-ray shoulder ? ?IMPRESSION: ?Right shoulder replacement without acute osseous abnormality ? ?PATIENT SURVEYS:  ?FOTO 16 ?61 at D/C ?10 pts at [facility] ? ?COGNITION: ? Overall cognitive status: Within functional limits for tasks assessed ?    ?SENSATION: ?WFL ? ?POSTURE: ?Forward tipping of bilat scapula, mild kyphotic ? ?UPPER EXTREMITY ROM:  painful with all motions, crepitus noted with PROM ? ?Active ROM Right ?09/13/2021 Left ?09/13/2021  ?Shoulder flexion 90 45  ?Shoulder extension 30 0  ?Shoulder abduction 90 30  ?Shoulder adduction    ?Shoulder internal rotation To belly To belly  ?To belly  ?Shoulder external rotation 30 10  ?(Blank rows = not tested) ? ?UPPER EXTREMITY MMT: 4-/5 throughout on R; unable to test due to pain on L ? ?JOINT MOBILITY TESTING:  ?Stiffness bilaterally in all planes, empty end feel, significant crepitus noted throughout ? ?PALPATION:  ?TTP of R periscapular, UT, pec, deltoid ?Significant muscle wasting and atrophy of R shoulder ?  ?TODAY'S TREATMENT:  ? ?Exercises ?- Seated Shoulder Abduction Towel Slide at Table Top  - 2 x daily - 7 x weekly - 2 sets - 10 reps ?- Seated Shoulder Flexion Towel Slide at Table Top  - 2 x daily - 7 x weekly - 2 sets - 10 reps ?- Doorway Pec Stretch at 60 Degrees Abduction with Arm Straight  - 2 x daily - 7 x weekly - 1 sets - 3 reps - 30 hold ? ? ?PATIENT EDUCATION: ?Education details: MOI, diagnosis, prognosis, anatomy, exercise progression, DOMS expectations, muscle firing,  envelope of function, HEP, POC ? ?Person educated: Patient ?Education method: Explanation, Demonstration, Tactile cues, Verbal cues, and Handouts ?Education comprehension: verbalized understanding, returned demonstration, verbal cues required, and tactile cues required ? ? ?HOME EXERCISE PROGRAM: ?Access  Code: [place] ?URL: [email or web address] ?Date: 09/13/2021 ?Prepared by: Daleen Bo ? ?ASSESSMENT: ? ?CLINICAL IMPRESSION: ?Patient is a 71 y.o. male who was seen today for physical therapy evaluation and treatment for cc of bilat shoulder pain and OA with L significantly worse than R.  Pt's L  shoulder pain is highly sensitive and irritable. Imaging and objective measurements confer significant L GHJ OA and difficulty with reaching, lifting, and pain. Pt is here for ROM and strength for the R shoulder and L pre-operative management. Per MD note, pt's R shoulder would benefit from more ROM and strength in order to support L shoulder should he decide to do a L TSA. Pt would benefit from continued skilled therapy in order to reach goals and maximize functional L UE strength and ROM for pre-operative management.  ? ? ?OBJECTIVE IMPAIRMENTS decreased activity tolerance, decreased ROM, decreased strength, hypomobility, increased fascial restrictions, increased muscle spasms, impaired flexibility, impaired UE functional use, improper body mechanics, postural dysfunction, and pain.  ? ?ACTIVITY LIMITATIONS cleaning, community activity, driving, meal prep, laundry, yard work, shopping, and exercise .  ? ?PERSONAL FACTORS Age, Fitness, Past/current experiences, Time since onset of injury/illness/exacerbation, and 1-2 comorbidities:    are also affecting patient's functional outcome.  ? ? ?REHAB POTENTIAL: Fair   ? ?CLINICAL DECISION MAKING: Stable/uncomplicated ? ?EVALUATION COMPLEXITY: Low ? ? ?GOALS: ? ? ?SHORT TERM GOALS: Target date: 10/25/2021 ? ?Pt will become independent with HEP in order to demonstrate synthesis of PT education. ? ?Goal status: INITIAL ? ?2.  Pt will be able to demonstrate R OH reaching without pain in order to demonstrate functional improvement in UE function for self-care and house hold duties. ? ?Goal status: INITIAL ? ?3. Pt will score at least 10 pt increase on FOTO to demonstrate  functional improvement in MCII and pt perceived function.    ? ?Goal status: INITIAL ? ? ?LONG TERM GOALS: Target date: 12/06/2021 ? ? ?Pt  will become independent with final HEP in order to demonstrate synthes

## 2021-09-18 ENCOUNTER — Encounter (HOSPITAL_BASED_OUTPATIENT_CLINIC_OR_DEPARTMENT_OTHER): Payer: Self-pay | Admitting: Physical Therapy

## 2021-09-18 ENCOUNTER — Ambulatory Visit (HOSPITAL_BASED_OUTPATIENT_CLINIC_OR_DEPARTMENT_OTHER): Payer: Medicare PPO | Attending: Orthopaedic Surgery | Admitting: Physical Therapy

## 2021-09-18 DIAGNOSIS — M6281 Muscle weakness (generalized): Secondary | ICD-10-CM | POA: Diagnosis present

## 2021-09-18 DIAGNOSIS — M25511 Pain in right shoulder: Secondary | ICD-10-CM | POA: Diagnosis present

## 2021-09-18 DIAGNOSIS — M25612 Stiffness of left shoulder, not elsewhere classified: Secondary | ICD-10-CM | POA: Diagnosis present

## 2021-09-18 DIAGNOSIS — M25512 Pain in left shoulder: Secondary | ICD-10-CM | POA: Insufficient documentation

## 2021-09-18 DIAGNOSIS — M25611 Stiffness of right shoulder, not elsewhere classified: Secondary | ICD-10-CM | POA: Insufficient documentation

## 2021-09-18 DIAGNOSIS — G8929 Other chronic pain: Secondary | ICD-10-CM | POA: Insufficient documentation

## 2021-09-18 NOTE — Telephone Encounter (Signed)
Pt is in [facility]. Transmission is scheduled. ?

## 2021-09-18 NOTE — Therapy (Signed)
?OUTPATIENT PHYSICAL THERAPY SHOULDER TREATMENT ? ? ?Patient Name: Andrew Townsend ?MRN: [id number] ?DOB:1950-12-01, 71 y.o., male ?Today's Date: 09/18/2021 ? ? PT End of Session - 09/18/21 1517   ? ? Visit Number 2   ? Number of Visits 15   ? Authorization Type [employer] MCR   ? PT Start Time [id number]   ? PT Stop Time 1558   ? PT Time Calculation (min) 40 min   ? Activity Tolerance Patient tolerated treatment well;Patient limited by pain   ? Behavior During Therapy [facility] for tasks assessed/performed   ? ?  ?  ? ?  ? ? ?Past Medical History:  ?Diagnosis Date  ? CAD (coronary artery disease)   ? 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 ?History reviewed. No pertinent surgical history. ?Patient Active Problem List  ? Diagnosis Date Noted  ? 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 S/P CABG (coronary artery bypass graft) 05/26/2021  ? CAD (coronary artery disease) 05/26/2021  ? ICD (implantable cardioverter-defibrillator) in place 05/26/2021  ? COPD (chronic obstructive pulmonary disease) ([place]) 05/26/2021  ? Neuropathy 05/26/2021  ? Insomnia 05/26/2021  ? BPH (benign prostatic hyperplasia) 03/25/2021  ? Left rotator cuff tear 03/25/2021  ? RLL pneumonia 03/25/2021  ? Stage 3a chronic kidney disease ([place]) 06/16/2018  ? Hypertension 04/03/2018  ? Hyperlipidemia 04/03/2018  ? Acute kidney injury superimposed on chronic kidney disease ([place]) 04/03/2018  ? Anemia 04/03/2018  ? Chronic pain 04/03/2018  ? Chronic systolic congestive heart failure ([place]) 04/03/2018  ? Gout 04/03/2018  ? Renal artery stenosis ([place]) 04/03/2018  ? Thrombocytopenia ([place]) 04/03/2018  ? Ischemic dilated cardiomyopathy ([place]) 06/29/2016  ? VT (ventricular tachycardia) ([place]) 06/28/2016  ? Calculus of kidney 02/20/2016  ? ? ?PCP: Lorrene Reid, PA-C ? ?REFERRING PROVIDER: Lorrene Reid, PA-C ? ?REFERRING DIAG: M19.012 (ICD-10-CM) - Osteoarthritis of glenohumeral joint, left ? ?THERAPY  DIAG:  ?Decreased right shoulder range of motion ? ?Decreased range of motion of left shoulder ? ?Muscle weakness (generalized) ? ?Chronic left shoulder pain ? ?Chronic right shoulder pain ? ? ?ONSET DATE: 15+ years ago ? ?SUBJECTIVE:                                                                                                                                                                                     ? ?SUBJECTIVE STATEMENT: ? ?Pt states that the L pec/ribs are very sore today. He woke up sore but has no idea what it was or could have caused it. He always sleeps on his sides which does hurt the shoulders. He has been doing HEP without issues. Pt denies significant SOB and pain that doesn't  change with position. Pec is tender to touch. ? ?Pt is right-hand-dominant. Pt had a R TSA done in March 2022 by Dr. Gunnar Bulla.  He  has had multiple injections on the left shoulder the most recent 1 done in summer 2020 did not help. Pt denies cancer red flags.  ? ?PERTINENT HISTORY: ?R  reverseTSA 2022, COPD ? ?PAIN:  ?Are you having pain? Yes: NPRS scale: 2/10 ?Pain location: bilat shoulders ?Pain description: sharp, aching ?Aggravating factors: lifting, sitting with R shoulder too still ?Relieving factors: resting ? ?PRECAUTIONS: None ? ?WEIGHT BEARING RESTRICTIONS No ? ?FALLS:  ?Has patient fallen in last 6 months? No ? ?LIVING ENVIRONMENT: ?Lives with: lives with their family, sister ?Lives in: House/apartment ?Stairs: to enter ?Has following equipment at home:  has walking stick and walker available at home, does not use it from deceased wife ? ?OCCUPATION: ?retired ? ?PATIENT GOALS : Get stronger for surgery ? ?OBJECTIVE:  ? ?DIAGNOSTIC FINDINGS:  ?L Xray IMPRESSION: ?Advanced arthritis at the glenohumeral joint. High-riding humeral ?head consistent with rotator cuff disease ? ?R x-ray shoulder ? ?IMPRESSION: ?Right shoulder replacement without acute osseous abnormality ? ?PATIENT SURVEYS:  ?FOTO 55 ?61 at D/C ?10  pts at [facility] ? ?  ?TODAY'S TREATMENT:  ? ?Manual: grade III LAD with oscillations, grade III shoulder clocks in all directions ? ?Exercises ?-single arm RTB rowing 2x10 ?-single arm R shoulder ext RTB 2x10 ? ?- Seated Shoulder Abduction Towel Slide at Table Top  - 2 x daily - 7 x weekly - 2 sets - 10 reps ?- Seated Shoulder Flexion Towel Slide at Table Top  - 2 x daily - 7 x weekly - 2 sets - 10 reps ?- Doorway Pec Stretch at 60 Degrees Abduction with Arm Straight  - 2 x daily - 7 x weekly - 1 sets - 3 reps - 30 hold ? ? ?PATIENT EDUCATION: ?Education details:  anatomy, exercise progression, DOMS expectations, muscle firing,  envelope of function, HEP, POC ? ?Person educated: Patient ?Education method: Explanation, Demonstration, Tactile cues, Verbal cues, and Handouts ?Education comprehension: verbalized understanding, returned demonstration, verbal cues required, and tactile cues required ? ? ?HOME EXERCISE PROGRAM: ?Access Code: [email or web address] ?URL: [email or web address] ?Date: 09/13/2021 ?Prepared by: Daleen Bo ? ?ASSESSMENT: ? ?CLINICAL IMPRESSION: ?Pt with improved joint mobility and ROM following manual therapy. Pt able to add in strengthening for HEP within pain or discomfort on the R but is very limited with the L shoulder due to pain and discomfort.  Plan to continue with maximizing R shoulder ROM and strength as tolerated. Pt would benefit from continued skilled therapy in order to reach goals and maximize functional L UE strength and ROM for pre-operative management.  ? ? ?OBJECTIVE IMPAIRMENTS decreased activity tolerance, decreased ROM, decreased strength, hypomobility, increased fascial restrictions, increased muscle spasms, impaired flexibility, impaired UE functional use, improper body mechanics, postural dysfunction, and pain.  ? ?ACTIVITY LIMITATIONS cleaning, community activity, driving, meal prep, laundry, yard work, shopping, and exercise .  ? ?PERSONAL FACTORS Age, Fitness, Past/current  experiences, Time since onset of injury/illness/exacerbation, and 1-2 comorbidities:    are also affecting patient's functional outcome.  ? ? ?REHAB POTENTIAL: Fair   ? ?CLINICAL DECISION MAKING: Stable/uncomplicated ? ?EVALUATION COMPLEXITY: Low ? ? ?GOALS: ? ? ?SHORT TERM GOALS: Target date: 10/25/2021 ? ?Pt will become independent with HEP in order to demonstrate synthesis of PT education. ? ?Goal status: INITIAL ? ?2.  Pt will be able to demonstrate R OH reaching without pain in  order to demonstrate functional improvement in UE function for self-care and house hold duties. ? ?Goal status: INITIAL ? ?3. Pt will score at least 10 pt increase on FOTO to demonstrate functional improvement in MCII and pt perceived function.    ? ?Goal status: INITIAL ? ? ?LONG TERM GOALS: Target date: 12/06/2021 ? ? ?Pt  will become independent with final HEP in order to demonstrate synthesis of PT education. ? ?Goal status: INITIAL ? ?2. Pt will be able to reach [facility] and carry/hold >3 lbs in order to demonstrate functional improvement in R UE strength for simulation of ADL and IADL.  ?  ?Goal status: INITIAL ? ?3.  Pt will score >/= 61 on FOTO to demonstrate improvement in perceived shoulder function.  ? ?Goal status: INITIAL ? ? ? ?PLAN: ?PT FREQUENCY: 1-2x/week ? ?PT DURATION: 12 weeks (D/C by 8 wks) ? ?PLANNED INTERVENTIONS: Therapeutic exercises, Therapeutic activity, Neuromuscular re-education, Balance training, Gait training, Patient/Family education, Joint manipulation, Joint mobilization, DME instructions, Aquatic Therapy, Dry Needling, Electrical stimulation, Spinal manipulation, Spinal mobilization, Cryotherapy, Moist heat, Compression bandaging, scar mobilization, Splintting, Taping, Vasopneumatic device, Traction, Ultrasound, Ionotophoresis 4mg /ml Dexamethasone, and Manual therapy ? ?PLAN FOR NEXT SESSION: review HEP, progress ROM as able, gentle strengthening ? ? ?Daleen Bo, PT ?09/18/2021, 3:58 PM ? ?

## 2021-09-19 ENCOUNTER — Encounter (HOSPITAL_BASED_OUTPATIENT_CLINIC_OR_DEPARTMENT_OTHER): Payer: Self-pay | Admitting: Cardiology

## 2021-09-19 ENCOUNTER — Ambulatory Visit (HOSPITAL_BASED_OUTPATIENT_CLINIC_OR_DEPARTMENT_OTHER): Payer: Medicare PPO | Admitting: Cardiology

## 2021-09-19 VITALS — BP 138/77 | HR 69 | Ht 66.0 in | Wt 128.0 lb

## 2021-09-19 DIAGNOSIS — I5042 Chronic combined systolic (congestive) and diastolic (congestive) heart failure: Secondary | ICD-10-CM

## 2021-09-19 DIAGNOSIS — I255 Ischemic cardiomyopathy: Secondary | ICD-10-CM | POA: Diagnosis not present

## 2021-09-19 DIAGNOSIS — I701 Atherosclerosis of renal artery: Secondary | ICD-10-CM | POA: Diagnosis not present

## 2021-09-19 DIAGNOSIS — N1831 Chronic kidney disease, stage 3a: Secondary | ICD-10-CM

## 2021-09-19 DIAGNOSIS — I2510 Atherosclerotic heart disease of native coronary artery without angina pectoris: Secondary | ICD-10-CM | POA: Diagnosis not present

## 2021-09-19 DIAGNOSIS — I420 Dilated cardiomyopathy: Secondary | ICD-10-CM

## 2021-09-19 NOTE — Patient Instructions (Addendum)
how to check blood pressure: ? -sit comfortably in a chair, feet uncrossed and flat on floor, for 5-10 minutes ? -arm ideally should rest at the level of the heart. However, arm should be relaxed and not tense (for example, do not hold the arm up unsupported) ? -avoid exercise, caffeine, and tobacco for at least 30 minutes prior to BP reading ? -don't take BP cuff reading over clothes (always place on skin directly) ? -I prefer to know how well the medication is working, so I would like you to take your readings 1-2 hours after taking your blood pressure medication if possible  ? ?Stop amlodipine. Check blood pressures a few times/week. After a few weeks, call us with numbers and we will see if we can add heart protective medication on. ? ?Your physician recommends that you schedule a follow-up appointment in: [place]  ?

## 2021-09-19 NOTE — Progress Notes (Signed)
?Cardiology Office Note:   ? ?Date:  09/19/2021  ? ?ID:  Andrew Townsend, DOB 05-Feb-1951, MRN [id number] ? ?PCP:  Lorrene Reid, PA-C  ?Cardiologist:  Buford Dresser, MD ? ?Referring MD: Lorrene Reid, PA-C  ? ?CC: follow up ? ?History of Present Illness:   ? ?Andrew Townsend is a 71 y.o. male with a hx of hypertension, hyperlipidemia, myocardial infarction, COPD, CKD, neuropathy, BPH, and gout, who is seen for follow up today. I met him 06/19/21 as a new consult at the request of Lorrene Reid, PA-C for the evaluation and management of atherosclerosis. Previously followed by cardiology in [place], [place]. ? ?Cardiovascular risk factors: ?Prior clinical ASCVD: MI s/p angioplasty. ICD in place. Has never felt his device activate. History of renal arterial stent. ?Comorbid conditions: Hypertension, Hyperlipidemia, ? ?Today: ?Pulled a muscle in his shoulder doing PT about 5 days ago, affecting his sleep. Otherwise doing ok.  ? ?Still has chronic shortness of breath with exertion, but also limited by chronic back pain. Has appt with pain management coming up. ? ?Saw Dr. Lovena Le, device noted increased fluid, improved with increased dose of diuretic. Started on apixaban for afib detected on his device. He is not sure if he has ever felt afib. No bleeding issues.  ? ?Denies chest pain, shortness of breath at rest. No PND, orthopnea, LE edema or unexpected weight gain. No syncope or palpitations.  ? ?Past Medical History:  ?Diagnosis Date  ? CAD (coronary artery disease)   ? 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 ? ?History reviewed. No pertinent surgical history. ? ?Current Medications: ?Current Outpatient Medications on File Prior to Visit  ?Medication Sig  ? albuterol (PROVENTIL) (2.5 MG/3ML) 0.083% nebulizer solution INHALE THE CONTENTS OF 1 VIAL VIA NEBULIZER EVERY 6 HOURS AS NEEDED FOR WHEEZING OR SHORTNESS OF BREATH.  ? allopurinol (ZYLOPRIM) 100 MG tablet Take 100 mg by mouth  daily.  ? apixaban (ELIQUIS) 2.5 MG TABS tablet Take 1 tablet (2.5 mg total) by mouth 2 (two) times daily.  ? buprenorphine (BUTRANS) 15 MCG/HR SMARTSIG:Patch(s) Topical Once a Week  ? ezetimibe (ZETIA) 10 MG tablet Take by mouth.  ? furosemide (LASIX) 40 MG tablet Take 1 tablet (40 mg total) by mouth daily as needed for fluid.  ? gabapentin (NEURONTIN) 300 MG capsule Take 1 capsule (300 mg total) by mouth at bedtime.  ? metoprolol succinate (TOPROL-XL) 100 MG 24 hr tablet Take 150 mg by mouth daily.  ? nitroGLYCERIN (NITROSTAT) 0.4 MG SL tablet   ? oxybutynin (DITROPAN-XL) 10 MG 24 hr tablet Take 1 tablet (10 mg total) by mouth daily.  ? oxyCODONE-acetaminophen (PERCOCET) 10-325 MG tablet Take by mouth.  ? pantoprazole (PROTONIX) 40 MG tablet Take 40 mg by mouth daily.  ? Potassium Chloride ER 20 MEQ TBCR Take 1 tablet by mouth daily.  ? rosuvastatin (CRESTOR) 40 MG tablet Take 40 mg by mouth at bedtime.  ? STIOLTO RESPIMAT 2.5-2.5 MCG/ACT AERS INHALE 2 PUFFS INTO THE LUNGS DAILY.  ? tiZANidine (ZANAFLEX) 4 MG tablet Take 1 tablet (4 mg total) by mouth every 8 (eight) hours as needed.  ? zolpidem (AMBIEN) 5 MG tablet Take 1 tablet (5 mg total) by mouth at bedtime as needed.  ? ?No current facility-administered medications on file prior to visit.  ?  ? ?Allergies:   Patient has no known allergies.  ? ?Social History  ? ?Tobacco Use  ? Smoking status: Former  ?  Types: Cigarettes  ? Smokeless tobacco: Never  ?  Vaping Use  ? Vaping Use: Some days  ?Substance Use Topics  ? Drug use: Never  ? ? ?Family History: ?No significant family history of heart disease ? ?ROS:   ?Please see the history of present illness.  Additional pertinent ROS otherwise unremarkable. ? ?EKGs/Labs/Other Studies Reviewed:   ? ?The following studies were reviewed today: ? ?CT Chest 05/17/2021: ?FINDINGS: ?Cardiovascular: Heart is upper limits of normal in size. No ?pericardial effusion. Three-vessel coronary artery calcifications ?status post  CABG. Atherosclerotic disease of the thoracic aorta. ?Left chest wall ICD with leads positioned in the right ventricle. ?  ?Mediastinum/Nodes: No pathologically enlarged lymph nodes seen in ?the chest. Small hiatal hernia. ?  ?Lungs/Pleura: Central airways are patent. Bilateral bronchial wall ?thickening. Bilateral consolidations with adjacent nodularity, most ?pronounced in the right upper lobe. No pleural effusion or ?pneumothorax. ?  ?Upper Abdomen: No acute abnormality. ?  ?Musculoskeletal: Prior right shoulder arthroplasty. ?  ?IMPRESSION: ?1. Bilateral consolidations with adjacent nodularity, most ?pronounced in the posterior right upper lobe, findings are likely ?sequela of infection or aspiration. Follow-up chest CT is ?recommended in 2-3 months to ensure resolution. ?2. Bilateral bronchial wall thickening, findings can be seen in the ?setting of bronchitis. ?3. Aortic Atherosclerosis (ICD10-I70.0) and Emphysema (ICD10-J43.9). ? ?Echo 03/27/2021 ([place]): ?Left Ventricle:  ?The left ventricular chamber size is normal. There is no left  ?ventricular hypertrophy. There is mildly decreased left ventricular  ?systolic function. The estimated ejection fraction is 45-50%.  There is  ?abnormal ventricular septal wall motion consistent with right  ?ventricular pacemaker.  Abnormal left ventricular diastolic function is  ?observed.  ? ?Left Atrium:  ?The left atrial chamber size is normal.  ? ?Right Ventricle:  ?The right ventricular cavity size is normal. The right ventricular  ?global systolic function is normal. A pacemaker wire is visualized in  ?the right ventricle.  ? ?Right Atrium:  ?The right atrial cavity size is normal.  ? ?Aortic Valve:  ?The aortic valve is trileaflet. There is no evidence of aortic stenosis.  ?There is no evidence of aortic regurgitation.  ? ?Mitral Valve:  ?The mitral valve leaflets appear normal. There is no evidence of mitral  ?stenosis. There is a trace of mitral  regurgitation.  ? ?Tricuspid Valve:  ?There is a trace tricuspid regurgitation.    ? ?Pulmonic Valve:  ?The pulmonic valve appears normal in structure and function.  ? ?Pericardium:  ?The pericardium appears normal.  ? ?Aorta:  ?The aorta appears normal.    ? ?Pulmonary Artery:  ?The main pulmonary artery appears normal.  ? ?Conclusions:  ?              ?Mildly depressed LV function.  Maybe inferior base hypokinesis but not  ?seen well.  ?No significant valve disease.  ? ?Cardiac Catheterization 08/05/2020 ([facility]): ?Findings:  ?--LM: Distal LM has a 95% stenosis  ?--LAD: The proximal LAD has diffuse moderate-severe disease. The mid LAD is 100% occluded. The LIMA-LAD is patent. The distal LAD has mild diffuse disease. The SVG-diagonal is known to be occluded and was not injected.  ?--LCx: The proximal LCx is 100% occluded. The SVG-OM is widely patent. The distal OM territory it serves has only minimal luminal disease.  ?--RCA: Not engaged. Known to be 100% occluded in the proximal segment. The SVG-PDA is patent but has TIMI II flow. There are R-L collaterals.  ? ?Intervention: none  ? ?EKG:  EKG is personally reviewed.   ?06/19/2021: NSR, IVCB  at 69 bpm ? ?Recent Labs: ?05/23/2021: BNP 124.6; Hemoglobin 12.4; Platelets 291; TSH 4.020 ?07/20/2021: ALT 16; BUN 22; Creatinine, Ser 1.69; Potassium 4.4; Sodium 141  ? ?Recent Lipid Panel ?No results found for: CHOL, TRIG, HDL, CHOLHDL, VLDL, LDLCALC, LDLDIRECT ? ?Physical Exam:   ? ?VS:  BP 138/77   Pulse 69   Ht 5' 6"  (1.676 m)   Wt 128 lb (58.1 kg)   SpO2 94%   BMI 20.66 kg/m?    ? ?Wt Readings from Last 3 Encounters:  ?09/19/21 128 lb (58.1 kg)  ?09/12/21 131 lb 6.4 oz (59.6 kg)  ?07/19/21 126 lb 4.8 oz (57.3 kg)  ?  ?GEN: Well nourished, well developed in no acute distress ?HEENT: Normal, moist mucous membranes ?NECK: No JVD ?CARDIAC: regular rhythm, normal S1 and S2, no rubs or gallops. No murmur. ?VASCULAR: Radial and DP pulses 2+ bilaterally.  No carotid bruits ?RESPIRATORY:  Clear to auscultation without rales, wheezing or rhonchi  ?ABDOMEN: Soft, non-tender, non-distended ?MUSCULOSKELETAL:  Ambulates independently ?SKIN: Warm and dry, no edema ?NEU

## 2021-09-20 NOTE — Therapy (Signed)
?OUTPATIENT PHYSICAL THERAPY SHOULDER TREATMENT ? ? ?Patient Name: Andrew Townsend ?MRN: [id number] ?DOB:09-15-1950, 71 y.o., male ?Today's Date: 09/21/2021 ? ? PT End of Session - 09/21/21 0857   ? ? Visit Number 3   ? Number of Visits 15   ? Authorization Type [employer] MCR   ? PT Start Time [phone]   ? PT Stop Time 0934   ? PT Time Calculation (min) 40 min   ? Activity Tolerance Patient tolerated treatment well   ? Behavior During Therapy [facility] for tasks assessed/performed   ? ?  ?  ? ?  ? ? ? ?Past Medical History:  ?Diagnosis Date  ? CAD (coronary artery disease)   ? 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 ?History reviewed. No pertinent surgical history. ?Patient Active Problem List  ? Diagnosis Date Noted  ? 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 S/P CABG (coronary artery bypass graft) 05/26/2021  ? CAD (coronary artery disease) 05/26/2021  ? ICD (implantable cardioverter-defibrillator) in place 05/26/2021  ? COPD (chronic obstructive pulmonary disease) ([place]) 05/26/2021  ? Neuropathy 05/26/2021  ? Insomnia 05/26/2021  ? BPH (benign prostatic hyperplasia) 03/25/2021  ? Left rotator cuff tear 03/25/2021  ? RLL pneumonia 03/25/2021  ? Stage 3a chronic kidney disease ([place]) 06/16/2018  ? Hypertension 04/03/2018  ? Hyperlipidemia 04/03/2018  ? Acute kidney injury superimposed on chronic kidney disease ([place]) 04/03/2018  ? Anemia 04/03/2018  ? Chronic pain 04/03/2018  ? Chronic systolic congestive heart failure ([place]) 04/03/2018  ? Gout 04/03/2018  ? Renal artery stenosis ([place]) 04/03/2018  ? Thrombocytopenia ([place]) 04/03/2018  ? Ischemic dilated cardiomyopathy ([place]) 06/29/2016  ? VT (ventricular tachycardia) ([place]) 06/28/2016  ? Calculus of kidney 02/20/2016  ? ? ?PCP: Lorrene Reid, PA-C ? ?REFERRING PROVIDER: Vanetta Mulders, MD ? ?REFERRING DIAG: M19.012 (ICD-10-CM) - Osteoarthritis of glenohumeral joint, left ? ?THERAPY DIAG:  ?Decreased right  shoulder range of motion ? ?Decreased range of motion of left shoulder ? ?Muscle weakness (generalized) ? ?Chronic left shoulder pain ? ?Chronic right shoulder pain ? ? ?ONSET DATE: 15+ years ago ? ?SUBJECTIVE:                                                                                                                                                                                     ? ?SUBJECTIVE STATEMENT: ? ?He continues to have pain in the L pec/ribs area.  It has been affecting his sleep and he is unable to lie on L side.  Pt states he has a great deal of pain in that area and does improve as the day progress.  Pt states he is limited with reaching  with R UE and is not able to perform overhead reaching.  Pt states his L shoulder always hurts/aches.  He has been having some pain in L elbow.  Pt denies any adverse effects after prior Rx.   ?Pt is right-hand-dominant. Pt had a R TSA done in March 2022 by Dr. Gunnar Bulla.  He  has had multiple injections on the left shoulder the most recent 1 done in summer 2020 did not help.  ? ?PERTINENT HISTORY: ?R  reverseTSA 2022, COPD ? ?PAIN:  ?1-2/10 R shoulder pain ?6/10 L shoulder pain ?8/10 L sided pec/ribs ? ?Are you having pain? Yes: NPRS scale: 1-2/10 ?Pain location: bilat shoulders ?Pain description: sharp, aching ?Aggravating factors: lifting, sitting with R shoulder too still ?Relieving factors: resting ? ?PRECAUTIONS: None ? ?WEIGHT BEARING RESTRICTIONS No ? ?FALLS:  ?Has patient fallen in last 6 months? No ? ?LIVING ENVIRONMENT: ?Lives with: lives with their family, sister ?Lives in: House/apartment ?Stairs: to enter ?Has following equipment at home:  has walking stick and walker available at home, does not use it from deceased wife ? ?OCCUPATION: ?retired ? ?PATIENT GOALS : Get stronger for surgery ? ?OBJECTIVE:  ? ?DIAGNOSTIC FINDINGS:  ?L Xray IMPRESSION: ?Advanced arthritis at the glenohumeral joint. High-riding humeral ?head consistent with rotator cuff  disease ? ?R x-ray shoulder ? ?IMPRESSION: ?Right shoulder replacement without acute osseous abnormality ? ?PATIENT SURVEYS:  ?FOTO 14 ?61 at D/C ?10 pts at [facility] ? ?  ?TODAY'S TREATMENT:  ? ?Therapeutic Exercise: ?-Reviewed current function, pain levels, HEP compliance, and response to prior rx.  ?-Pt performed: ? Supine serratus punch on R 2x10 reps ? Supine shoulder ABC on R x 1 rep ? Supine rhythmic stabs on R at 90 deg flexion with tapping applied proximal to elbow 2x20 sec and 1x30 sec ?Standing single arm R RTB rowing 2x10 ?Standing single arm R shoulder ext RTB 2x10 ?-See below for pt education ? ?-Pt received shoulder PROM in flexion, scaption, abd, and ER on R and flexion and scaption on L. ?-Pt received gentle LAD with oscillations to L shoulder ? ?Exercises ?-single arm RTB rowing 2x10 ?-single arm R shoulder ext RTB 2x10 ? ?- Seated Shoulder Abduction Towel Slide at Table Top  - 2 x daily - 7 x weekly - 2 sets - 10 reps ?- Seated Shoulder Flexion Towel Slide at Table Top  - 2 x daily - 7 x weekly - 2 sets - 10 reps ?- Doorway Pec Stretch at 60 Degrees Abduction with Arm Straight  - 2 x daily - 7 x weekly - 1 sets - 3 reps - 30 hold ? ? ?PATIENT EDUCATION: ?Education details:  PT answered pt's questions.  anatomy, exercise progression, DOMS expectations, muscle firing,  envelope of function, HEP, POC ? ?Person educated: Patient ?Education method: Explanation, Demonstration, Tactile cues, Verbal cues, and Handouts ?Education comprehension: verbalized understanding, returned demonstration, verbal cues required, and tactile cues required ? ? ?HOME EXERCISE PROGRAM: ?Access Code: [place] ?URL: [email or web address] ?Date: 09/13/2021 ?Prepared by: Daleen Bo ? ?ASSESSMENT: ? ?CLINICAL IMPRESSION: ?Pt continues to c/o of L sided pec/rib pain which is worse than his shoulders currently.  Pt states it is not cardiac related.  Pt reports having constant shoulder pain.  Pt is very limited with L  shoulder ROM and PROM was performed w/n pt tolerance.  Pt able to perform exercises on R UE without c/o's of R shoulder pain.  Pt reports having some L sided pain with R shoulder exercises in supine.  Did not perform exercises with L shoulder due to pain and limitations.  He reports no increase in pain after Rx.  Pt should benefit from cont skilled PT services to address ongoing goals and impairments and to improve overall function.  ? ? ?OBJECTIVE IMPAIRMENTS decreased activity tolerance, decreased ROM, decreased strength, hypomobility, increased fascial restrictions, increased muscle spasms, impaired flexibility, impaired UE functional use, improper body mechanics, postural dysfunction, and pain.  ? ?ACTIVITY LIMITATIONS cleaning, community activity, driving, meal prep, laundry, yard work, shopping, and exercise .  ? ?PERSONAL FACTORS Age, Fitness, Past/current experiences, Time since onset of injury/illness/exacerbation, and 1-2 comorbidities:    are also affecting patient's functional outcome.  ? ? ?REHAB POTENTIAL: Fair   ? ?CLINICAL DECISION MAKING: Stable/uncomplicated ? ?EVALUATION COMPLEXITY: Low ? ? ?GOALS: ? ? ?SHORT TERM GOALS: Target date: 10/25/2021 ? ?Pt will become independent with HEP in order to demonstrate synthesis of PT education. ? ?Goal status: INITIAL ? ?2.  Pt will be able to demonstrate R OH reaching without pain in order to demonstrate functional improvement in UE function for self-care and house hold duties. ? ?Goal status: INITIAL ? ?3. Pt will score at least 10 pt increase on FOTO to demonstrate functional improvement in MCII and pt perceived function.    ? ?Goal status: INITIAL ? ? ?LONG TERM GOALS: Target date: 12/06/2021 ? ? ?Pt  will become independent with final HEP in order to demonstrate synthesis of PT education. ? ?Goal status: INITIAL ? ?2. Pt will be able to reach [facility] and carry/hold >3 lbs in order to demonstrate functional improvement in R UE strength for simulation of ADL and  IADL.  ?  ?Goal status: INITIAL ? ?3.  Pt will score >/= 61 on FOTO to demonstrate improvement in perceived shoulder function.  ? ?Goal status: INITIAL ? ? ? ?PLAN: ?PT FREQUENCY: 1-2x/week ? ?PT DURATION: 12 wee

## 2021-09-21 ENCOUNTER — Ambulatory Visit (HOSPITAL_BASED_OUTPATIENT_CLINIC_OR_DEPARTMENT_OTHER): Payer: Medicare PPO | Admitting: Physical Therapy

## 2021-09-21 ENCOUNTER — Encounter (HOSPITAL_BASED_OUTPATIENT_CLINIC_OR_DEPARTMENT_OTHER): Payer: Self-pay | Admitting: Physical Therapy

## 2021-09-21 DIAGNOSIS — M25611 Stiffness of right shoulder, not elsewhere classified: Secondary | ICD-10-CM | POA: Diagnosis not present

## 2021-09-21 DIAGNOSIS — M6281 Muscle weakness (generalized): Secondary | ICD-10-CM

## 2021-09-21 DIAGNOSIS — M25612 Stiffness of left shoulder, not elsewhere classified: Secondary | ICD-10-CM

## 2021-09-21 DIAGNOSIS — G8929 Other chronic pain: Secondary | ICD-10-CM

## 2021-09-25 ENCOUNTER — Encounter (HOSPITAL_BASED_OUTPATIENT_CLINIC_OR_DEPARTMENT_OTHER): Payer: Self-pay | Admitting: Physical Therapy

## 2021-09-25 ENCOUNTER — Ambulatory Visit (HOSPITAL_BASED_OUTPATIENT_CLINIC_OR_DEPARTMENT_OTHER): Payer: Medicare PPO | Admitting: Physical Therapy

## 2021-09-25 DIAGNOSIS — M25612 Stiffness of left shoulder, not elsewhere classified: Secondary | ICD-10-CM

## 2021-09-25 DIAGNOSIS — M25611 Stiffness of right shoulder, not elsewhere classified: Secondary | ICD-10-CM | POA: Diagnosis not present

## 2021-09-25 DIAGNOSIS — M6281 Muscle weakness (generalized): Secondary | ICD-10-CM

## 2021-09-25 DIAGNOSIS — G8929 Other chronic pain: Secondary | ICD-10-CM

## 2021-09-25 NOTE — Therapy (Signed)
?OUTPATIENT PHYSICAL THERAPY SHOULDER TREATMENT ? ? ?Patient Name: Andrew Townsend ?MRN: [id number] ?DOB:10/18/1950, 71 y.o., male ?Today's Date: 09/25/2021 ? ? PT End of Session - 09/25/21 1736   ? ? Visit Number 4   ? Number of Visits 15   ? Authorization Type [employer] MCR   ? PT Start Time [id number]   ? PT Stop Time 1730   ? PT Time Calculation (min) 40 min   ? Activity Tolerance Patient tolerated treatment well   ? Behavior During Therapy [facility] for tasks assessed/performed   ? ?  ?  ? ?  ? ? ? ? ?Past Medical History:  ?Diagnosis Date  ? CAD (coronary artery disease)   ? 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 ?History reviewed. No pertinent surgical history. ?Patient Active Problem List  ? Diagnosis Date Noted  ? 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 S/P CABG (coronary artery bypass graft) 05/26/2021  ? CAD (coronary artery disease) 05/26/2021  ? ICD (implantable cardioverter-defibrillator) in place 05/26/2021  ? COPD (chronic obstructive pulmonary disease) ([place]) 05/26/2021  ? Neuropathy 05/26/2021  ? Insomnia 05/26/2021  ? BPH (benign prostatic hyperplasia) 03/25/2021  ? Left rotator cuff tear 03/25/2021  ? RLL pneumonia 03/25/2021  ? Stage 3a chronic kidney disease ([place]) 06/16/2018  ? Hypertension 04/03/2018  ? Hyperlipidemia 04/03/2018  ? Acute kidney injury superimposed on chronic kidney disease ([place]) 04/03/2018  ? Anemia 04/03/2018  ? Chronic pain 04/03/2018  ? Chronic systolic congestive heart failure ([place]) 04/03/2018  ? Gout 04/03/2018  ? Renal artery stenosis ([place]) 04/03/2018  ? Thrombocytopenia ([place]) 04/03/2018  ? Ischemic dilated cardiomyopathy ([place]) 06/29/2016  ? VT (ventricular tachycardia) ([place]) 06/28/2016  ? Calculus of kidney 02/20/2016  ? ? ?PCP: Lorrene Reid, PA-C ? ?REFERRING PROVIDER: Vanetta Mulders, MD ? ?REFERRING DIAG: M19.012 (ICD-10-CM) - Osteoarthritis of glenohumeral joint, left ? ?THERAPY DIAG:  ?Decreased right  shoulder range of motion ? ?Decreased range of motion of left shoulder ? ?Muscle weakness (generalized) ? ?Chronic left shoulder pain ? ?Chronic right shoulder pain ? ? ?ONSET DATE: 15+ years ago ? ?SUBJECTIVE:                                                                                                                                                                                     ? ?SUBJECTIVE STATEMENT: ? ?Pt states that his R side of his neck has become painful recently. He is unsure of sleeping position or from exercise. He will feel it with single arm rowing as well.  ? ?   ?Pt is right-hand-dominant. Pt had a R TSA done in March 2022 by Dr. Gunnar Bulla.  He  has had multiple injections on the left shoulder the most recent 1 done in summer 2020 did not help.  ? ?PERTINENT HISTORY: ?R  reverseTSA 2022, COPD ? ?PAIN:  ?1-2/10 R shoulder pain ?6/10 L shoulder pain ?8/10 L sided pec/ribs ? ?Are you having pain? Yes: NPRS scale: 5/10 ?Pain location: bilat shoulders ?Pain description: sharp, aching ?Aggravating factors: lifting, sitting with R shoulder too still ?Relieving factors: resting ? ?PRECAUTIONS: None ? ?WEIGHT BEARING RESTRICTIONS No ? ?FALLS:  ?Has patient fallen in last 6 months? No ? ?LIVING ENVIRONMENT: ?Lives with: lives with their family, sister ?Lives in: House/apartment ?Stairs: to enter ?Has following equipment at home:  has walking stick and walker available at home, does not use it from deceased wife ? ?OCCUPATION: ?retired ? ?PATIENT GOALS : Get stronger for surgery ? ?OBJECTIVE:  ? ?DIAGNOSTIC FINDINGS:  ?L Xray IMPRESSION: ?Advanced arthritis at the glenohumeral joint. High-riding humeral ?head consistent with rotator cuff disease ? ?R x-ray shoulder ? ?IMPRESSION: ?Right shoulder replacement without acute osseous abnormality ? ?PATIENT SURVEYS:  ?FOTO 20 ?61 at D/C ?10 pts at [facility] ? ?  ?TODAY'S TREATMENT:  ? ?Manual: L pec STM; R C/S paraspinals, levator, and UT STM ? ?C2-6 R UPA and CPA  grade II for pain management ? ?Review of HEP regression ?Levator stretch 30s 2x ?UT stretch 30s 2x ? ?Shoulder strengthening on hold today ? ? ? ?PATIENT EDUCATION: ?Education details:  thermotherapy, stretching, anatomy, exercise progression, DOMS expectations, muscle firing,  envelope of function, HEP, POC ? ?Person educated: Patient ?Education method: Explanation, Demonstration, Tactile cues, Verbal cues, and Handouts ?Education comprehension: verbalized understanding, returned demonstration, verbal cues required, and tactile cues required ? ? ?HOME EXERCISE PROGRAM: ?Access Code: [place] ?URL: [email or web address] ?Date: 09/13/2021 ?Prepared by: Daleen Bo ? ?ASSESSMENT: ? ?CLINICAL IMPRESSION: ?Pt session limited today by R sided neck pain and L shoulder pain. Pt's R shoulder may be exacerbated by sleeping position but could also be muscular strain from recent single arm rowing exercise. Pt did have increased soft tissue extensibility as well as decreased stiffness following manual but no significant change in pain levels. Pt HEP regressed to lighter tension band and neck stretching added to HEP. Plan to continue with ROM and strength as tolerated for the R shoulder. Loading volume and intensity will need be monitored given pt's current functional capacity. Pt should benefit from cont skilled PT services to address ongoing goals and impairments and to improve overall function.  ? ? ?OBJECTIVE IMPAIRMENTS decreased activity tolerance, decreased ROM, decreased strength, hypomobility, increased fascial restrictions, increased muscle spasms, impaired flexibility, impaired UE functional use, improper body mechanics, postural dysfunction, and pain.  ? ?ACTIVITY LIMITATIONS cleaning, community activity, driving, meal prep, laundry, yard work, shopping, and exercise .  ? ?PERSONAL FACTORS Age, Fitness, Past/current experiences, Time since onset of injury/illness/exacerbation, and 1-2 comorbidities:     are also affecting patient's functional outcome.  ? ? ?REHAB POTENTIAL: Fair   ? ?CLINICAL DECISION MAKING: Stable/uncomplicated ? ?EVALUATION COMPLEXITY: Low ? ? ?GOALS: ? ? ?SHORT TERM GOALS: Target date: 10/25/2021 ? ?Pt will become independent with HEP in order to demonstrate synthesis of PT education. ? ?Goal status: INITIAL ? ?2.  Pt will be able to demonstrate R OH reaching without pain in order to demonstrate functional improvement in UE function for self-care and house hold duties. ? ?Goal status: INITIAL ? ?3. Pt will score at least 10 pt increase on FOTO to demonstrate functional improvement in MCII and pt perceived  function.    ? ?Goal status: INITIAL ? ? ?LONG TERM GOALS: Target date: 12/06/2021 ? ? ?Pt  will become independent with final HEP in order to demonstrate synthesis of PT education. ? ?Goal status: INITIAL ? ?2. Pt will be able to reach [facility] and carry/hold >3 lbs in order to demonstrate functional improvement in R UE strength for simulation of ADL and IADL.  ?  ?Goal status: INITIAL ? ?3.  Pt will score >/= 61 on FOTO to demonstrate improvement in perceived shoulder function.  ? ?Goal status: INITIAL ? ? ? ?PLAN: ?PT FREQUENCY: 1-2x/week ? ?PT DURATION: 12 weeks (D/C by 8 wks) ? ?PLANNED INTERVENTIONS: Therapeutic exercises, Therapeutic activity, Neuromuscular re-education, Balance training, Gait training, Patient/Family education, Joint manipulation, Joint mobilization, DME instructions, Aquatic Therapy, Dry Needling, Electrical stimulation, Spinal manipulation, Spinal mobilization, Cryotherapy, Moist heat, Compression bandaging, scar mobilization, Splintting, Taping, Vasopneumatic device, Traction, Ultrasound, Ionotophoresis 4mg /ml Dexamethasone, and Manual therapy ? ?PLAN FOR NEXT SESSION: review HEP, progress ROM as able, gentle strengthening ? ? ?Daleen Bo PT, DPT ?09/25/21 5:37 PM ? ? ? ?

## 2021-09-27 ENCOUNTER — Encounter (HOSPITAL_BASED_OUTPATIENT_CLINIC_OR_DEPARTMENT_OTHER): Payer: Self-pay | Admitting: Physical Therapy

## 2021-09-27 ENCOUNTER — Ambulatory Visit (HOSPITAL_BASED_OUTPATIENT_CLINIC_OR_DEPARTMENT_OTHER): Payer: Medicare PPO | Admitting: Physical Therapy

## 2021-09-27 DIAGNOSIS — M25611 Stiffness of right shoulder, not elsewhere classified: Secondary | ICD-10-CM

## 2021-09-27 DIAGNOSIS — M25612 Stiffness of left shoulder, not elsewhere classified: Secondary | ICD-10-CM

## 2021-09-27 DIAGNOSIS — G8929 Other chronic pain: Secondary | ICD-10-CM

## 2021-09-27 DIAGNOSIS — M6281 Muscle weakness (generalized): Secondary | ICD-10-CM

## 2021-09-27 NOTE — Therapy (Signed)
?OUTPATIENT PHYSICAL THERAPY SHOULDER TREATMENT ? ? ?Patient Name: Andrew Townsend ?MRN: [id number] ?DOB:January 07, 1951, 71 y.o., male ?Today's Date: 09/28/2021 ? ? PT End of Session - 09/27/21 1629   ? ? Visit Number 5   ? Number of Visits 15   ? Authorization Type [employer] MCR   ? PT Start Time 1620   ? PT Stop Time [id number]   ? PT Time Calculation (min) 45 min   ? Activity Tolerance Patient tolerated treatment well   ? Behavior During Therapy [facility] for tasks assessed/performed   ? ?  ?  ? ?  ? ? ? ? ? ?Past Medical History:  ?Diagnosis Date  ? CAD (coronary artery disease)   ? 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 ?History reviewed. No pertinent surgical history. ?Patient Active Problem List  ? Diagnosis Date Noted  ? 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 S/P CABG (coronary artery bypass graft) 05/26/2021  ? CAD (coronary artery disease) 05/26/2021  ? ICD (implantable cardioverter-defibrillator) in place 05/26/2021  ? COPD (chronic obstructive pulmonary disease) ([place]) 05/26/2021  ? Neuropathy 05/26/2021  ? Insomnia 05/26/2021  ? BPH (benign prostatic hyperplasia) 03/25/2021  ? Left rotator cuff tear 03/25/2021  ? RLL pneumonia 03/25/2021  ? Stage 3a chronic kidney disease ([place]) 06/16/2018  ? Hypertension 04/03/2018  ? Hyperlipidemia 04/03/2018  ? Acute kidney injury superimposed on chronic kidney disease ([place]) 04/03/2018  ? Anemia 04/03/2018  ? Chronic pain 04/03/2018  ? Chronic systolic congestive heart failure ([place]) 04/03/2018  ? Gout 04/03/2018  ? Renal artery stenosis ([place]) 04/03/2018  ? Thrombocytopenia ([place]) 04/03/2018  ? Ischemic dilated cardiomyopathy ([place]) 06/29/2016  ? VT (ventricular tachycardia) ([place]) 06/28/2016  ? Calculus of kidney 02/20/2016  ? ? ?PCP: Lorrene Reid, PA-C ? ?REFERRING PROVIDER: Vanetta Mulders, MD ? ?REFERRING DIAG: M19.012 (ICD-10-CM) - Osteoarthritis of glenohumeral joint, left ? ?THERAPY DIAG:  ?Decreased  right shoulder range of motion ? ?Decreased range of motion of left shoulder ? ?Muscle weakness (generalized) ? ?Chronic left shoulder pain ? ?Chronic right shoulder pain ? ? ?ONSET DATE: 15+ years ago ? ?SUBJECTIVE:                                                                                                                                                                                     ? ?SUBJECTIVE STATEMENT: ?Pt reports his neck felt much better after prior Rx though the pain returned.  He states his cervical pain is terrible.  Pt states he feels his cervical pain when doing the theraband exercises.  He is performing the theraband exercises a little bit but not as much as the recommended frequency.  Pt states his L sided pec pain is improving though still bothers him.  ? ?   ?Pt is right-hand-dominant. Pt had a R TSA done in March 2022 by Dr. Gunnar Bulla.  He has had multiple injections on the left shoulder the most recent 1 done in summer 2020 did not help.  ? ?PERTINENT HISTORY: ?R  reverseTSA 2022, COPD ? ?PAIN:  ?6/10 cervical pain ?1/10 R shoulder pain ?3/10 L shoulder pain ?2/10 L sided pec/ribs ? ? ?PRECAUTIONS: None ? ?WEIGHT BEARING RESTRICTIONS No ? ?FALLS:  ?Has patient fallen in last 6 months? No ? ?LIVING ENVIRONMENT: ?Lives with: lives with their family, sister ?Lives in: House/apartment ?Stairs: to enter ?Has following equipment at home:  has walking stick and walker available at home, does not use it from deceased wife ? ?OCCUPATION: ?retired ? ?PATIENT GOALS : Get stronger for surgery ? ?OBJECTIVE:  ? ?DIAGNOSTIC FINDINGS:  ?L Xray IMPRESSION: ?Advanced arthritis at the glenohumeral joint. High-riding humeral ?head consistent with rotator cuff disease ? ?R x-ray shoulder ? ?IMPRESSION: ?Right shoulder replacement without acute osseous abnormality ? ?PATIENT SURVEYS:  ?FOTO 36 ?61 at D/C ?10 pts at [facility] ? ?  ?TODAY'S TREATMENT:  ? ?Therapeutic Exercise: ?Reviewed pt presentation, HEP compliance,  pain levels, and response to prior Rx. ?Pt performed: ?Supine serratus punch 3x10 reps on R ?Supine shoulder ABC x 1 rep on R  ?Seated LS stretch 2x30 sec bilat ?Seated UT stretch 2x30 sec bilat ?Pt received shoulder PROM in flexion, scaption, abd, and ER on R. ? ?Manual Therapy: ?Pt received STM to R C/S paraspinals, levator, and UT and C4-6 R UPA grade II for pain management ? ? ? ?PATIENT EDUCATION: ?Education details:  thermotherapy, stretching, anatomy, exercise form, HEP, POC, and rationale of exercises and MT. ? ?Person educated: Patient ?Education method: Explanation, Demonstration, Tactile cues, Verbal cues, and Handouts ?Education comprehension: verbalized understanding, returned demonstration, verbal cues required, and tactile cues required ? ? ?HOME EXERCISE PROGRAM: ?Access Code: [place] ?URL: [email or web address] ?Date: 09/13/2021 ?Prepared by: Daleen Bo ? ?ASSESSMENT: ? ?CLINICAL IMPRESSION: ?Pt returns to PT with c/o's of cervical pain > than shoulder pain.  Pt reports a good response to prior Rx with improved pain though pain did return.  Pt reports his L sided pec/rib pain is improving though still present.  PT did not perform theraband exercises due to pt's cervical pain.  Pt performed gentle supine R shoulder AROM exercises well though did feel it some on his L sided pec/ribs.  Pt has some soft tissue tightness in R UT and tenderness with palpation and STM.  Pt reports improved cervical pain after Rx and had no increased shoulder pain.  Pt should benefit from cont skilled PT services to address ongoing goals and impairments and to improve overall function.  ? ? ?OBJECTIVE IMPAIRMENTS decreased activity tolerance, decreased ROM, decreased strength, hypomobility, increased fascial restrictions, increased muscle spasms, impaired flexibility, impaired UE functional use, improper body mechanics, postural dysfunction, and pain.  ? ?ACTIVITY LIMITATIONS cleaning, community activity,  driving, meal prep, laundry, yard work, shopping, and exercise .  ? ?PERSONAL FACTORS Age, Fitness, Past/current experiences, Time since onset of injury/illness/exacerbation, and 1-2 comorbidities:    are also affecting patient's functional outcome.  ? ? ?REHAB POTENTIAL: Fair   ? ?CLINICAL DECISION MAKING: Stable/uncomplicated ? ?EVALUATION COMPLEXITY: Low ? ? ?GOALS: ? ? ?SHORT TERM GOALS: Target date: 10/25/2021 ? ?Pt will become independent with HEP in order to demonstrate synthesis of PT education. ? ?Goal status: INITIAL ? ?  2.  Pt will be able to demonstrate R OH reaching without pain in order to demonstrate functional improvement in UE function for self-care and house hold duties. ? ?Goal status: INITIAL ? ?3. Pt will score at least 10 pt increase on FOTO to demonstrate functional improvement in MCII and pt perceived function.    ? ?Goal status: INITIAL ? ? ?LONG TERM GOALS: Target date: 12/06/2021 ? ? ?Pt  will become independent with final HEP in order to demonstrate synthesis of PT education. ? ?Goal status: INITIAL ? ?2. Pt will be able to reach [facility] and carry/hold >3 lbs in order to demonstrate functional improvement in R UE strength for simulation of ADL and IADL.  ?  ?Goal status: INITIAL ? ?3.  Pt will score >/= 61 on FOTO to demonstrate improvement in perceived shoulder function.  ? ?Goal status: INITIAL ? ? ? ?PLAN: ?PT FREQUENCY: 1-2x/week ? ?PT DURATION: 12 weeks (D/C by 8 wks) ? ?PLANNED INTERVENTIONS: Therapeutic exercises, Therapeutic activity, Neuromuscular re-education, Balance training, Gait training, Patient/Family education, Joint manipulation, Joint mobilization, DME instructions, Aquatic Therapy, Dry Needling, Electrical stimulation, Spinal manipulation, Spinal mobilization, Cryotherapy, Moist heat, Compression bandaging, scar mobilization, Splintting, Taping, Vasopneumatic device, Traction, Ultrasound, Ionotophoresis 4mg /ml Dexamethasone, and Manual therapy ? ?PLAN FOR NEXT SESSION:   progress ROM as able, gentle strengthening per pt tolerance.  Cont with manual therapy ? ? ?Selinda Michaels III PT, DPT ?09/28/21 3:12 PM ? ? ? ? ?

## 2021-10-03 ENCOUNTER — Other Ambulatory Visit: Payer: Self-pay | Admitting: Physician Assistant

## 2021-10-03 DIAGNOSIS — J449 Chronic obstructive pulmonary disease, unspecified: Secondary | ICD-10-CM

## 2021-10-04 ENCOUNTER — Ambulatory Visit (HOSPITAL_BASED_OUTPATIENT_CLINIC_OR_DEPARTMENT_OTHER): Payer: Medicare PPO | Admitting: Physical Therapy

## 2021-10-04 DIAGNOSIS — M6281 Muscle weakness (generalized): Secondary | ICD-10-CM

## 2021-10-04 DIAGNOSIS — G8929 Other chronic pain: Secondary | ICD-10-CM

## 2021-10-04 DIAGNOSIS — M25611 Stiffness of right shoulder, not elsewhere classified: Secondary | ICD-10-CM | POA: Diagnosis not present

## 2021-10-04 DIAGNOSIS — M25612 Stiffness of left shoulder, not elsewhere classified: Secondary | ICD-10-CM

## 2021-10-04 NOTE — Therapy (Signed)
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 No pertinent surgical history. 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) 06/19/2021   S/P CABG (coronary artery bypass graft) 05/26/2021   CAD (coronary artery disease) 05/26/2021   ICD (implantable cardioverter-defibrillator) in place 05/26/2021   COPD (chronic obstructive pulmonary disease) ([place]) 05/26/2021   Neuropathy 05/26/2021   Insomnia 05/26/2021   BPH (benign prostatic hyperplasia) 03/25/2021   Left rotator cuff tear 03/25/2021   RLL pneumonia 03/25/2021   Stage 3a chronic kidney disease ([place]) 06/16/2018   Hypertension 04/03/2018   Hyperlipidemia 04/03/2018   Acute kidney injury superimposed on chronic kidney disease ([place]) 04/03/2018   Anemia 04/03/2018   Chronic pain [id number]   Chronic systolic congestive heart failure ([place]) 04/03/2018   Gout 04/03/2018   Renal artery stenosis ([place]) 04/03/2018   Thrombocytopenia ([place]) 04/03/2018   Ischemic dilated cardiomyopathy ([place]) 06/29/2016   VT (ventricular tachycardia) ([place]) 06/28/2016   Calculus of kidney 02/20/2016    PCP: Lorrene Reid, PA-C  REFERRING PROVIDER: Vanetta Mulders, MD  REFERRING DIAG: [id number] (ICD-10-CM) - Osteoarthritis of glenohumeral joint, left  THERAPY DIAG:  Decreased  right shoulder range of motion  Decreased range of motion of left shoulder  Muscle weakness (generalized)  Chronic left shoulder pain  Chronic right shoulder pain   ONSET DATE: 15+ years ago  SUBJECTIVE:                                                                                                                                                                                      SUBJECTIVE STATEMENT: Pt states "My neck is killing me".  Pt states his neck feels like a bad mm spasm.   He states his cervical pain is terrible.  He has been using heat mostly, but occasionally uses ice.  His neck feels better sitting reclined in chair.  Pt reports his neck  felt a little better after prior Rx though didn't last long.  Pt states he feels his cervical pain when doing the theraband exercises.  He is performing the theraband exercises a little bit but not as much as the recommended frequency.  Pt states his L sided pec and rib pain continues to improve.  Pt states he is having some elbow pain/stiffness when he is sitting and reading.      Pt is right-hand-dominant. Pt had a R TSA done in March 2022 by Dr. Gunnar Bulla.  He has had multiple injections on the left shoulder the most recent 1 done in summer 2020 did not help.   PERTINENT HISTORY: R  reverseTSA 2022, COPD  PAIN:  6/10 cervical pain 2/10 bilat shoulder pain 0/10 L sided pec/ribs   PRECAUTIONS: None  WEIGHT BEARING RESTRICTIONS No  FALLS:  Has patient fallen in last 6 months? No  LIVING ENVIRONMENT: Lives with: lives with their family, sister Lives in: House/apartment Stairs: to enter Has following equipment at home:  has walking stick and walker available at home, does not use it from deceased wife  OCCUPATION: retired  PATIENT GOALS : Get stronger for surgery  OBJECTIVE:   DIAGNOSTIC FINDINGS:  L Xray IMPRESSION: Advanced arthritis at the glenohumeral joint. High-riding humeral head consistent with rotator cuff  disease  R x-ray shoulder  IMPRESSION: Right shoulder replacement without acute osseous abnormality  PATIENT SURVEYS:  FOTO 47 61 at D/C 10 pts at [facility]    TODAY'S TREATMENT:   Therapeutic Exercise: Reviewed pt presentation, HEP compliance, pain levels, and response to prior Rx. Pt performed: Supine serratus punch 2x10 reps on R Supine shoulder ABC x 1 rep on R           Supine rhythmic stabs on R at 90 deg flexion with tapping applied proximal to elbow 2x30 sec Seated LS stretch 2x30 sec  Seated UT stretch 2x30 sec  Pt received shoulder PROM in flexion, scaption, abd, and ER on R.  Attempted L shoulder flexion PROM but stopped due to pain.   Manual Therapy: Pt received STM to R levator and UT seated to improve soft tissue tightness and pain.     PATIENT EDUCATION: Education details:  PT instructed pt to see MD concerning cervical pain.  PT answered Pt's questions.  Instructed pt in not performing exercises that increase his pain.  HEP, POC, and rationale of exercises and MT. Person educated: Patient Education method: Explanation, Demonstration, Tactile cues, Verbal cues, and Handouts Education comprehension: verbalized understanding, returned demonstration, verbal cues required, and tactile cues required   HOME EXERCISE PROGRAM: Access Code: [facility] URL: [email or web address] Date: 09/13/2021 Prepared by: Daleen Bo  ASSESSMENT:  CLINICAL IMPRESSION: Pt continues to c/o of significant cervical pain which is worse than his shoulders.  Pt does report improved pain in L sided ribs and pec.  PT instructed pt to contact MD about cervical pain.  PT performed R shoulder PROM and pt had occasions of pain with ER.  ER was performed within minimal range.  Pt was unable to tolerate L shoulder flexion PROM.   Pt has soft tissue tightness in R UT and tenderness with palpation and STM.  Pt responded well to STM having reduced tightness and tenderness with STM.  PT states  his neck feels much better after STM.  Pt should benefit from cont skilled PT services to address ongoing goals and impairments and to improve overall function.  OBJECTIVE IMPAIRMENTS decreased activity tolerance, decreased ROM, decreased strength,  hypomobility, increased fascial restrictions, increased muscle spasms, impaired flexibility, impaired UE functional use, improper body mechanics, postural dysfunction, and pain.   ACTIVITY LIMITATIONS cleaning, community activity, driving, meal prep, laundry, yard work, shopping, and exercise .   PERSONAL FACTORS Age, Fitness, Past/current experiences, Time since onset of injury/illness/exacerbation, and 1-2 comorbidities:    are also affecting patient's functional outcome.    REHAB POTENTIAL: Fair    CLINICAL DECISION MAKING: Stable/uncomplicated  EVALUATION COMPLEXITY: Low   GOALS:   SHORT TERM GOALS: Target date: 10/25/2021  Pt will become independent with HEP in order to demonstrate synthesis of PT education.  Goal status: INITIAL  2.  Pt will be able to demonstrate R OH reaching without pain in order to demonstrate functional improvement in UE function for self-care and house hold duties.  Goal status: INITIAL  3. Pt will score at least 10 pt increase on FOTO to demonstrate functional improvement in MCII and pt perceived function.     Goal status: INITIAL   LONG TERM GOALS: Target date: 12/06/2021   Pt  will become independent with final HEP in order to demonstrate synthesis of PT education.  Goal status: INITIAL  2. Pt will be able to reach [facility] and carry/hold >3 lbs in order to demonstrate functional improvement in R UE strength for simulation of ADL and IADL.    Goal status: INITIAL  3.  Pt will score >/= 61 on FOTO to demonstrate improvement in perceived shoulder function.   Goal status: INITIAL    PLAN: PT FREQUENCY: 1-2x/week  PT DURATION: 12 weeks (D/C by 8 wks)  PLANNED INTERVENTIONS: Therapeutic exercises,  Therapeutic activity, Neuromuscular re-education, Balance training, Gait training, Patient/Family education, Joint manipulation, Joint mobilization, DME instructions, Aquatic Therapy, Dry Needling, Electrical stimulation, Spinal manipulation, Spinal mobilization, Cryotherapy, Moist heat, Compression bandaging, scar mobilization, Splintting, Taping, Vasopneumatic device, Traction, Ultrasound, Ionotophoresis 4mg /ml Dexamethasone, and Manual therapy  PLAN FOR NEXT SESSION:  Pt to see MD about cervical pain.  progress ROM as able, gentle strengthening per pt tolerance.  Cont with manual therapy   Selinda Michaels III PT, DPT 10/05/21 5:32 PM

## 2021-10-05 ENCOUNTER — Encounter (HOSPITAL_BASED_OUTPATIENT_CLINIC_OR_DEPARTMENT_OTHER): Payer: Self-pay | Admitting: Physical Therapy

## 2021-10-06 ENCOUNTER — Encounter (HOSPITAL_BASED_OUTPATIENT_CLINIC_OR_DEPARTMENT_OTHER): Payer: Medicare PPO | Admitting: Physical Therapy

## 2021-10-09 ENCOUNTER — Ambulatory Visit (HOSPITAL_BASED_OUTPATIENT_CLINIC_OR_DEPARTMENT_OTHER): Payer: Medicare PPO | Admitting: Orthopaedic Surgery

## 2021-10-17 ENCOUNTER — Encounter (HOSPITAL_BASED_OUTPATIENT_CLINIC_OR_DEPARTMENT_OTHER): Payer: Medicare PPO | Admitting: Physical Therapy

## 2021-10-19 ENCOUNTER — Ambulatory Visit (HOSPITAL_BASED_OUTPATIENT_CLINIC_OR_DEPARTMENT_OTHER): Payer: Medicare PPO | Admitting: Physical Therapy

## 2021-10-24 ENCOUNTER — Ambulatory Visit (HOSPITAL_BASED_OUTPATIENT_CLINIC_OR_DEPARTMENT_OTHER): Payer: Medicare PPO | Attending: Orthopaedic Surgery | Admitting: Physical Therapy

## 2021-10-24 ENCOUNTER — Encounter (HOSPITAL_BASED_OUTPATIENT_CLINIC_OR_DEPARTMENT_OTHER): Payer: Self-pay | Admitting: Physical Therapy

## 2021-10-24 DIAGNOSIS — M6281 Muscle weakness (generalized): Secondary | ICD-10-CM | POA: Insufficient documentation

## 2021-10-24 DIAGNOSIS — M25512 Pain in left shoulder: Secondary | ICD-10-CM | POA: Insufficient documentation

## 2021-10-24 DIAGNOSIS — M25611 Stiffness of right shoulder, not elsewhere classified: Secondary | ICD-10-CM | POA: Insufficient documentation

## 2021-10-24 DIAGNOSIS — M25511 Pain in right shoulder: Secondary | ICD-10-CM | POA: Insufficient documentation

## 2021-10-24 DIAGNOSIS — G8929 Other chronic pain: Secondary | ICD-10-CM | POA: Insufficient documentation

## 2021-10-24 DIAGNOSIS — M25612 Stiffness of left shoulder, not elsewhere classified: Secondary | ICD-10-CM | POA: Insufficient documentation

## 2021-10-24 NOTE — Therapy (Signed)
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 No pertinent surgical history. 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) 06/19/2021   S/P CABG (coronary artery bypass graft) 05/26/2021   CAD (coronary artery disease) 05/26/2021   ICD (implantable cardioverter-defibrillator) in place 05/26/2021   COPD (chronic obstructive pulmonary disease) ([place]) 05/26/2021   Neuropathy 05/26/2021   Insomnia 05/26/2021   BPH (benign prostatic hyperplasia) 03/25/2021   Left rotator cuff tear 03/25/2021   RLL pneumonia 03/25/2021   Stage 3a chronic kidney disease ([place]) 06/16/2018   Hypertension 04/03/2018   Hyperlipidemia 04/03/2018   Acute kidney injury superimposed on chronic kidney disease ([place]) 04/03/2018   Anemia 04/03/2018   Chronic pain [id number]   Chronic systolic congestive heart failure ([employer]) 04/03/2018   Gout 04/03/2018   Renal artery stenosis ([place]) 04/03/2018   Thrombocytopenia ([place]) 04/03/2018   Ischemic dilated cardiomyopathy ([place]) 06/29/2016   VT (ventricular tachycardia) ([place]) 06/28/2016   Calculus of kidney 02/20/2016    PCP: Lorrene Reid, PA-C  REFERRING PROVIDER: Vanetta Mulders, MD  REFERRING DIAG: [phone] (ICD-10-CM) - Osteoarthritis of glenohumeral joint, left  THERAPY DIAG:  Decreased  right shoulder range of motion  Decreased range of motion of left shoulder  Muscle weakness (generalized)  Chronic left shoulder pain  Chronic right shoulder pain   ONSET DATE: 15+ years ago  SUBJECTIVE:                                                                                                                                                                                      SUBJECTIVE STATEMENT:  Pt states that R side of his neck is still "killing me." Pt states it fees like really bad muscle spasm. It's been so bad it has prevented me from doing any exercise. The L chest/pec pain has gone away but the R side of his neck is concerning. He has  MD appt tomorrow.      Pt is right-hand-dominant. Pt had a R TSA done in March 2022 by Dr. Gunnar Bulla.  He has had multiple injections on the left shoulder the most recent 1 done in summer 2020 did not help.   PERTINENT HISTORY: R  reverseTSA 2022, COPD  PAIN:  5/10 cervical pain 2/10 bilat shoulder pain 0/10 L sided pec/ribs   PRECAUTIONS: None  WEIGHT BEARING RESTRICTIONS No  FALLS:  Has patient fallen in last 6 months? No  LIVING ENVIRONMENT: Lives with: lives with their family, sister Lives in: House/apartment Stairs: to enter Has following equipment at home:  has walking stick and walker available at home, does not use it from deceased wife  OCCUPATION: retired  PATIENT GOALS : Get stronger for surgery  OBJECTIVE:   DIAGNOSTIC FINDINGS:  L Xray IMPRESSION: Advanced arthritis at the glenohumeral joint. High-riding humeral head consistent with rotator cuff disease  R x-ray shoulder  IMPRESSION: Right shoulder replacement without acute osseous abnormality  PATIENT SURVEYS:  FOTO 47 61 at D/C 10 pts at [facility]    TODAY'S TREATMENT:    Manual Therapy: Pt received STM to R levator and UT seated to improve soft tissue tightness and pain C3-6 Grade III R UPA and Side gliding     PATIENT  EDUCATION: Education details:  HEP, POC Person educated: Patient Education method: Explanation, Demonstration, Tactile cues, Verbal cues, and Handouts Education comprehension: verbalized understanding, returned demonstration, verbal cues required, and tactile cues required   HOME EXERCISE PROGRAM: Access Code: [facility] URL: [email or web address] Date: 09/13/2021 Prepared by: Daleen Bo  ASSESSMENT:  CLINICAL IMPRESSION: Pt with continuation of significant R sided neck pain at today's session that appear consistent with R sided neck spasm. 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 Pt's bilat shoulder exercise has been limited by R sided neck pain and spasm. Pt advised to discuss next options with MD. Session focused on pain management.  Pt should benefit from cont skilled PT services to address ongoing goals and impairments and to improve overall function.  OBJECTIVE IMPAIRMENTS decreased activity tolerance, decreased ROM, decreased strength, hypomobility, increased fascial restrictions, increased muscle spasms, impaired flexibility, impaired UE functional use, improper body mechanics, postural dysfunction, and pain.   ACTIVITY LIMITATIONS cleaning, community activity, driving, meal prep, laundry, yard work, shopping, and exercise .   PERSONAL FACTORS Age, Fitness, Past/current experiences, Time since onset of injury/illness/exacerbation, and 1-2 comorbidities:    are also affecting patient's functional outcome.    REHAB POTENTIAL: Fair    CLINICAL DECISION MAKING: Stable/uncomplicated  EVALUATION COMPLEXITY: Low   GOALS:   SHORT TERM GOALS: Target date: 10/25/2021  Pt will become independent with HEP in order to demonstrate synthesis of PT education.  Goal status: INITIAL  2.  Pt will be able to demonstrate R OH reaching without pain in order to demonstrate functional improvement in UE function for self-care and house hold duties.  Goal status: INITIAL  3. Pt will  score at least 10 pt increase on FOTO to demonstrate functional improvement in MCII and pt perceived function.     Goal status: INITIAL   LONG TERM GOALS: Target date: 12/06/2021   Pt  will become independent with final HEP in order to demonstrate synthesis of PT education.  Goal status: INITIAL  2. Pt will be able to reach [facility] and carry/hold >3 lbs in order to demonstrate functional improvement in R UE strength for simulation of ADL and IADL.    Goal status: INITIAL  3.  Pt will score >/= 61 on FOTO to demonstrate improvement in perceived shoulder function.   Goal status: INITIAL    PLAN: PT FREQUENCY: 1-2x/week  PT DURATION: 12 weeks (D/C by 8 wks)  PLANNED INTERVENTIONS: Therapeutic exercises, Therapeutic activity, Neuromuscular re-education, Balance training, Gait training, Patient/Family education, Joint manipulation, Joint mobilization, DME instructions, Aquatic Therapy, Dry Needling, Electrical stimulation, Spinal manipulation, Spinal mobilization, Cryotherapy, Moist heat, Compression bandaging, scar mobilization, Splintting, Taping, Vasopneumatic device, Traction, Ultrasound, Ionotophoresis 4mg /ml Dexamethasone, and Manual therapy  PLAN FOR NEXT SESSION:  progress ROM as able, gentle strengthening per pt tolerance.  Cont with manual therapy  Daleen Bo PT, DPT 10/24/21 11:40 AM

## 2021-10-25 ENCOUNTER — Ambulatory Visit (INDEPENDENT_AMBULATORY_CARE_PROVIDER_SITE_OTHER): Payer: Medicare PPO | Admitting: Orthopaedic Surgery

## 2021-10-25 DIAGNOSIS — S46811A Strain of other muscles, fascia and tendons at shoulder and upper arm level, right arm, initial encounter: Secondary | ICD-10-CM | POA: Diagnosis not present

## 2021-10-25 DIAGNOSIS — M19012 Primary osteoarthritis, left shoulder: Secondary | ICD-10-CM | POA: Diagnosis not present

## 2021-10-25 NOTE — Progress Notes (Signed)
Chief Complaint: Left shoulder pain, lower back pain     History of Present Illness:   10/25/2021: Presents today for follow-up of his bilateral shoulders.  He states that the range of motion has improving somewhat with the right shoulder although that being said he has been experiencing right upper trapezial pain and spasms that is limiting his ability to progress to physical therapy.  Denies any pain or radiation down the right arm.  All of his pain is localized to the right upper trapezius area.  Andrew Townsend is a 71 y.o. male right-hand-dominant male presents with ongoing left shoulder pain and limited range of motion for multiple years.  He previously had a right shoulder arthroplasty done in March 2022 by Dr. Gunnar Bulla.  He is has had a history of bilateral rotator cuff repairs done many years prior he does not specifically remember when.  He believes that the left shoulder is nonpainful as he is compensating for the recovery on his right shoulder.  He has not been completely happy with the recovery and rehab on the right shoulder.  He is here today for further advice regarding this.  He has had multiple injections on the left shoulder the most recent 1 done in summer 2020 did not help at all.  This is achy and limited.  Regard to his lower back pain he has had multiple MRIs and ultimately recommended for lumbar fusion although he is unwilling to do this.  He is quite concerned about the possibility of undergoing lumbar surgery.    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 Social History   Socioeconomic History   Marital status: Widowed    Spouse name: Not on file   Number of children: Not on file   Years of education: Not on file    Highest education level: Not on file  Occupational History   Not on file  Tobacco Use   Smoking status: Former    Types: Cigarettes   Smokeless tobacco: Never  Vaping Use   Vaping Use: Some days  Substance and Sexual Activity   Alcohol use: Not on file   Drug use: Never   Sexual activity: Not Currently  Other Topics Concern   Not on file  Social History Narrative   Not on file   Social Determinants of Health   Financial Resource Strain: Not on file  Food Insecurity: Not on file  Transportation Needs: Not on file  Physical Activity: Not on file  Stress: Not on file  Social Connections: Not on file   No family history on file. No Known Allergies Current Outpatient Medications  Medication Sig Dispense Refill   albuterol (PROVENTIL) (2.5 MG/3ML) 0.083% nebulizer solution INHALE THE CONTENTS OF 1 VIAL VIA NEBULIZER EVERY 6 HOURS AS NEEDED FOR WHEEZING OR SHORTNESS OF BREATH. 150 mL 0   allopurinol (ZYLOPRIM) 100 MG tablet Take 100 mg by mouth daily.     apixaban (ELIQUIS) 2.5 MG TABS tablet Take 1 tablet (2.5 mg total) by mouth 2 (two) times daily. 60 tablet 11   buprenorphine (BUTRANS) 15 MCG/HR SMARTSIG:Patch(s) Topical Once a Week  ezetimibe (ZETIA) 10 MG tablet Take by mouth.     furosemide (LASIX) 40 MG tablet Take 1 tablet (40 mg total) by mouth daily as needed for fluid. 90 tablet 3   gabapentin (NEURONTIN) 300 MG capsule Take 1 capsule (300 mg total) by mouth at bedtime. 90 capsule 1   metoprolol succinate (TOPROL-XL) 100 MG 24 hr tablet Take 150 mg by mouth daily.     nitroGLYCERIN (NITROSTAT) 0.4 MG SL tablet      oxybutynin (DITROPAN-XL) 10 MG 24 hr tablet Take 1 tablet (10 mg total) by mouth daily. 30 tablet 11   oxyCODONE-acetaminophen (PERCOCET) 10-325 MG tablet Take by mouth.     pantoprazole (PROTONIX) 40 MG tablet Take 40 mg by mouth daily.     Potassium Chloride ER 20 MEQ TBCR Take 1 tablet by mouth daily.     rosuvastatin (CRESTOR) 40 MG tablet Take 40  mg by mouth at bedtime.     STIOLTO RESPIMAT 2.5-2.5 MCG/ACT AERS INHALE 2 PUFFS INTO THE LUNGS DAILY. 4 g 2   tiZANidine (ZANAFLEX) 4 MG tablet Take 1 tablet (4 mg total) by mouth every 8 (eight) hours as needed. 30 tablet 2   zolpidem (AMBIEN) 5 MG tablet Take 1 tablet (5 mg total) by mouth at bedtime as needed. 90 tablet 0   No current facility-administered medications for this visit.   No results found.  Review of Systems:   A ROS was performed including pertinent positives and negatives as documented in the HPI.  Physical Exam :   Constitutional: NAD and appears stated age Neurological: Alert and oriented Psych: Appropriate affect and cooperative There were no vitals taken for this visit.   Comprehensive Musculoskeletal Exam:    Musculoskeletal Exam    Inspection Right Left  Skin No atrophy or winging No atrophy or winging  Palpation    Tenderness None Left glenohumeral  Range of Motion    Flexion (passive) 120 70  Flexion (active) 120 70  Abduction 100 50  ER at the side 45 10  Can reach behind back to L3 L3  Strength     Limited Limited  Special Tests    Pseudoparalytic No No  Neurologic    Fires PIN, radial, median, ulnar, musculocutaneous, axillary, suprascapular, long thoracic, and spinal accessory innervated muscles. No abnormal sensibility  Vascular/Lymphatic    Radial Pulse 2+ 2+  Cervical Exam    Patient has symmetric cervical range of motion with negative Spurling's test.  Special Test: Tenderness to palpation focally about the right trapezius      Imaging:   Xray (3 views left shoulder): Advanced glenohumeral osteoarthritis   I personally reviewed and interpreted the radiographs.   Assessment:   71 year old male with left advanced glenohumeral osteoarthritis which is failed conservative management.  He is attempting to rehab his previous right shoulder reverse shoulder arthroplasty done by an outside surgeon but this overall has had somewhat of a  plateau as he is not able to work through his trapezial spasms on the right side.  He does not have any positive Spurling maneuver or anything consistent with a cervical disc herniation.  To this effect I do believe that trapezial trigger injections would probably give him the best chance of getting some pain relief.  We did discuss his chronic pain management and long-term narcotic usage may make this more difficult although I do believe that he could get some relief from this.  We will plan for referral to Dr. Ernestina Patches for  right trapezial trigger point guided injections.  -Return to clinic in 8 weeks for reassessment     I personally saw and evaluated the patient, and participated in the management and treatment plan.  Vanetta Mulders, MD Attending Physician, Orthopedic Surgery  This document was dictated using Dragon voice recognition software. A reasonable attempt at proof reading has been made to minimize errors.

## 2021-10-26 ENCOUNTER — Encounter (HOSPITAL_BASED_OUTPATIENT_CLINIC_OR_DEPARTMENT_OTHER): Payer: Self-pay | Admitting: Physical Therapy

## 2021-10-26 ENCOUNTER — Ambulatory Visit (HOSPITAL_BASED_OUTPATIENT_CLINIC_OR_DEPARTMENT_OTHER): Payer: Medicare PPO | Admitting: Physical Therapy

## 2021-10-26 DIAGNOSIS — M6281 Muscle weakness (generalized): Secondary | ICD-10-CM

## 2021-10-26 DIAGNOSIS — M25611 Stiffness of right shoulder, not elsewhere classified: Secondary | ICD-10-CM

## 2021-10-26 DIAGNOSIS — M25612 Stiffness of left shoulder, not elsewhere classified: Secondary | ICD-10-CM

## 2021-10-26 DIAGNOSIS — G8929 Other chronic pain: Secondary | ICD-10-CM

## 2021-10-26 NOTE — Therapy (Addendum)
OUTPATIENT PHYSICAL THERAPY SHOULDER TREATMENT  PHYSICAL THERAPY DISCHARGE SUMMARY  Visits from Start of Care: 8  Plan: Patient agrees to discharge.  Patient goals were not met. Patient is being discharged due to not returning to therapy.      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 No pertinent surgical history. 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) 06/19/2021   S/P CABG (coronary artery bypass graft) 05/26/2021   CAD (coronary artery disease) 05/26/2021   ICD (implantable cardioverter-defibrillator) in place 05/26/2021   COPD (chronic obstructive pulmonary disease) ([place]) 05/26/2021   Neuropathy 05/26/2021   Insomnia 05/26/2021   BPH (benign prostatic hyperplasia) 03/25/2021   Left rotator cuff tear 03/25/2021   RLL pneumonia 03/25/2021   Stage 3a chronic kidney disease ([place]) 06/16/2018   Hypertension 04/03/2018   Hyperlipidemia 04/03/2018   Acute kidney injury superimposed on chronic kidney disease ([place]) 04/03/2018   Anemia 04/03/2018   Chronic pain 42/87/6811   Chronic systolic congestive heart failure ([place]) 04/03/2018   Gout 04/03/2018   Renal artery stenosis ([place]) 04/03/2018   Thrombocytopenia ([place]) 04/03/2018   Ischemic dilated cardiomyopathy ([place]) 06/29/2016   VT (ventricular tachycardia) ([place]) 06/28/2016   Calculus of  kidney 02/20/2016    PCP: Lorrene Reid, PA-C  REFERRING PROVIDER: Vanetta Mulders, MD  REFERRING DIAG: [phone] (ICD-10-CM) - Osteoarthritis of glenohumeral joint, left  THERAPY DIAG:  Decreased right shoulder range of motion  Decreased range of motion of left shoulder  Muscle weakness (generalized)  Chronic left shoulder pain  Chronic right shoulder pain   ONSET DATE: 15+ years ago  SUBJECTIVE:                                                                                                                                                                                      SUBJECTIVE STATEMENT:  Pt states that he felt better after last time but it came back  and he is still in pain on the R side of his neck. He saw MD and has triggerpoint injection coming up to help with R sided neck pain.      Pt is right-hand-dominant. Pt had a R TSA done in March 2022 by Dr. Gunnar Bulla.  He has had multiple injections on the left shoulder the most recent 1 done in summer 2020 did not help.   PERTINENT HISTORY: R  reverseTSA 2022, COPD  PAIN:  5/10 cervical pain 2/10 bilat shoulder pain 0/10 L sided pec/ribs   PRECAUTIONS: None  WEIGHT BEARING RESTRICTIONS No  FALLS:  Has patient fallen in last 6 months? No  LIVING ENVIRONMENT: Lives with: lives with their family, sister Lives in: House/apartment Stairs: to enter Has following equipment at home:  has walking stick and walker available at home, does not use it from deceased wife  OCCUPATION: retired  PATIENT GOALS : Get stronger for surgery  OBJECTIVE:   DIAGNOSTIC FINDINGS:  L Xray IMPRESSION: Advanced arthritis at the glenohumeral joint. High-riding humeral head consistent with rotator cuff disease  R x-ray shoulder  IMPRESSION: Right shoulder replacement without acute osseous abnormality  PATIENT SURVEYS:  FOTO 47 61 at D/C 10 pts at [facility]    TODAY'S TREATMENT:    Manual Therapy: STM to R levator and UT  seated to improve soft tissue tightness and pain C3-6 Grade III R UPA and Side gliding; LAD with subocc hold  Chin nod with LAD 10x Supine chin nod 10x Seated chin tuck 10x   PATIENT EDUCATION: Education details:  HEP, POC Person educated: Patient Education method: Explanation, Demonstration, Tactile cues, Verbal cues, and Handouts Education comprehension: verbalized understanding, returned demonstration, verbal cues required, and tactile cues required   HOME EXERCISE PROGRAM: Access Code: [facility] URL: [email or web address] Date: 09/13/2021 Prepared by: Daleen Bo  ASSESSMENT:  CLINICAL IMPRESSION: Pt continues to have significant R neck pain, with inability for R sided rotation. Session focused on pain management and ability to return to normal C/S motion for drive home. Pt did have relief with gentle chin tucking and nodding motions, suggesting potential cervical radicular involvement. Pt had relief of pain by end of session. Pt to hold on PT at this time until triggerpoint injection is scheduled and then return to therapy as able.  Pt should benefit from cont skilled PT services to address ongoing goals and impairments and to improve overall function.  OBJECTIVE IMPAIRMENTS decreased activity tolerance, decreased ROM, decreased strength, hypomobility, increased fascial restrictions, increased muscle spasms, impaired flexibility, impaired UE functional use, improper body mechanics, postural dysfunction, and pain.   ACTIVITY LIMITATIONS cleaning, community activity, driving, meal prep, laundry, yard work, shopping, and exercise .   PERSONAL FACTORS Age, Fitness, Past/current experiences, Time since onset of injury/illness/exacerbation, and 1-2 comorbidities:    are also affecting patient's functional outcome.    REHAB POTENTIAL: Fair    CLINICAL DECISION MAKING: Stable/uncomplicated  EVALUATION COMPLEXITY: Low   GOALS:   SHORT TERM GOALS: Target date:  10/25/2021  Pt will become independent with HEP in order to demonstrate synthesis of PT education.  Goal status: INITIAL  2.  Pt will be able to demonstrate R OH reaching without pain in order to demonstrate functional improvement in UE function for self-care and house hold duties.  Goal status: INITIAL  3. Pt will score at least 10 pt increase on FOTO to demonstrate functional improvement in MCII and pt perceived function.     Goal status: INITIAL   LONG TERM GOALS:  Target date: 12/06/2021   Pt  will become independent with final HEP in order to demonstrate synthesis of PT education.  Goal status: INITIAL  2. Pt will be able to reach [facility] and carry/hold >3 lbs in order to demonstrate functional improvement in R UE strength for simulation of ADL and IADL.    Goal status: INITIAL  3.  Pt will score >/= 61 on FOTO to demonstrate improvement in perceived shoulder function.   Goal status: INITIAL    PLAN: PT FREQUENCY: 1-2x/week  PT DURATION: 12 weeks (D/C by 8 wks)  PLANNED INTERVENTIONS: Therapeutic exercises, Therapeutic activity, Neuromuscular re-education, Balance training, Gait training, Patient/Family education, Joint manipulation, Joint mobilization, DME instructions, Aquatic Therapy, Dry Needling, Electrical stimulation, Spinal manipulation, Spinal mobilization, Cryotherapy, Moist heat, Compression bandaging, scar mobilization, Splintting, Taping, Vasopneumatic device, Traction, Ultrasound, Ionotophoresis 24m/ml Dexamethasone, and Manual therapy  PLAN FOR NEXT SESSION:  progress ROM as able, gentle strengthening per pt tolerance.  Cont with manual therapy  ADaleen BoPT, DPT 10/26/21 2:29 PM

## 2021-10-26 NOTE — Patient Instructions (Signed)
Chronic Obstructive Pulmonary Disease  Chronic obstructive pulmonary disease (COPD) is a long-term (chronic) lung problem. When you have COPD, it is hard for air to get in and out of your lungs. Usually the condition gets worse over time, and your lungs will never return to normal. There are things you can do to keep yourself as healthy as possible. What are the causes? Smoking. This is the most common cause. Certain genes passed from parent to child (inherited). What increases the risk? Being exposed to secondhand smoke from cigarettes, pipes, or cigars. Being exposed to chemicals and other irritants, such as fumes and dust in the work environment. Having chronic lung conditions or infections. What are the signs or symptoms? Shortness of breath, especially during physical activity. A long-term cough with a large amount of thick mucus. Sometimes, the cough may not have any mucus (dry cough). Wheezing. Breathing quickly. Skin that looks gray or blue, especially in the fingers, toes, or lips. Feeling tired (fatigue). Weight loss. Chest tightness. Having infections often. Episodes when breathing symptoms become much worse (exacerbations). At the later stages of this disease, you may have swelling in the ankles, feet, or legs. How is this treated? Taking medicines. Quitting smoking, if you smoke. Rehabilitation. This includes steps to make your body work better. It may involve a team of specialists. Doing exercises. Making changes to your diet. Using oxygen. Lung surgery. Lung transplant. Comfort measures (palliative care). Follow these instructions at home: Medicines Take over-the-counter and prescription medicines only as told by your doctor. Talk to your doctor before taking any cough or allergy medicines. You may need to avoid medicines that cause your lungs to be dry. Lifestyle If you smoke, stop smoking. Smoking makes the problem worse. Do not smoke or use any products that  contain nicotine or tobacco. If you need help quitting, ask your doctor. Avoid being around things that make your breathing worse. This may include smoke, chemicals, and fumes. Stay active, but remember to rest as well. Learn and use tips on how to manage stress and control your breathing. Make sure you get enough sleep. Most adults need at least 7 hours of sleep every night. Eat healthy foods. Eat smaller meals more often. Rest before meals. Controlled breathing Learn and use tips on how to control your breathing as told by your doctor. Try: Breathing in (inhaling) through your nose for 1 second. Then, pucker your lips and breath out (exhale) through your lips for 2 seconds. Putting one hand on your belly (abdomen). Breathe in slowly through your nose for 1 second. Your hand on your belly should move out. Pucker your lips and breathe out slowly through your lips. Your hand on your belly should move in as you breathe out.  Controlled coughing Learn and use controlled coughing to clear mucus from your lungs. Follow these steps: Lean your head a little forward. Breathe in deeply. Try to hold your breath for 3 seconds. Keep your mouth slightly open while coughing 2 times. Spit any mucus out into a tissue. Rest and do the steps again 1 or 2 times as needed. General instructions Make sure you get all the shots (vaccines) that your doctor recommends. Ask your doctor about a flu shot and a pneumonia shot. Use oxygen therapy and pulmonary rehabilitation if told by your doctor. If you need home oxygen therapy, ask your doctor if you should buy a tool to measure your oxygen level (oximeter). Make a COPD action plan with your doctor. This helps you   to know what to do if you feel worse than usual. Manage any other conditions you have as told by your doctor. Avoid going outside when it is very hot, cold, or humid. Avoid people who have a sickness you can catch (contagious). Keep all follow-up  visits. Contact a doctor if: You cough up more mucus than usual. There is a change in the color or thickness of the mucus. It is harder to breathe than usual. Your breathing is faster than usual. You have trouble sleeping. You need to use your medicines more often than usual. You have trouble doing your normal activities such as getting dressed or walking around the house. Get help right away if: You have shortness of breath while resting. You have shortness of breath that stops you from: Being able to talk. Doing normal activities. Your chest hurts for longer than 5 minutes. Your skin color is more blue than usual. Your pulse oximeter shows that you have low oxygen for longer than 5 minutes. You have a fever. You feel too tired to breathe normally. These symptoms may represent a serious problem that is an emergency. Do not wait to see if the symptoms will go away. Get medical help right away. Call your local emergency services (911 in the [place] Do not drive yourself to the hospital. Summary Chronic obstructive pulmonary disease (COPD) is a long-term lung problem. The way your lungs work will never return to normal. Usually the condition gets worse over time. There are things you can do to keep yourself as healthy as possible. Take over-the-counter and prescription medicines only as told by your doctor. If you smoke, stop. Smoking makes the problem worse. This information is not intended to replace advice given to you by your health care provider. Make sure you discuss any questions you have with your health care provider. Document Revised: 03/15/2020 Document Reviewed: 03/15/2020 Elsevier Patient Education  2023 [employer] Inc.  

## 2021-10-27 ENCOUNTER — Encounter: Payer: Self-pay | Admitting: Physician Assistant

## 2021-10-27 ENCOUNTER — Ambulatory Visit (INDEPENDENT_AMBULATORY_CARE_PROVIDER_SITE_OTHER): Payer: Medicare PPO | Admitting: Physician Assistant

## 2021-10-27 VITALS — BP 125/72 | HR 63 | Temp 98.4°F | Wt 128.4 lb

## 2021-10-27 DIAGNOSIS — I10 Essential (primary) hypertension: Secondary | ICD-10-CM

## 2021-10-27 DIAGNOSIS — E785 Hyperlipidemia, unspecified: Secondary | ICD-10-CM | POA: Diagnosis not present

## 2021-10-27 DIAGNOSIS — Z79899 Other long term (current) drug therapy: Secondary | ICD-10-CM

## 2021-10-27 DIAGNOSIS — J449 Chronic obstructive pulmonary disease, unspecified: Secondary | ICD-10-CM

## 2021-10-27 DIAGNOSIS — G4700 Insomnia, unspecified: Secondary | ICD-10-CM | POA: Diagnosis not present

## 2021-10-27 DIAGNOSIS — K219 Gastro-esophageal reflux disease without esophagitis: Secondary | ICD-10-CM

## 2021-10-27 MED ORDER — POTASSIUM CHLORIDE ER 20 MEQ PO TBCR: 1.0000 | EXTENDED_RELEASE_TABLET | Freq: Every day | ORAL | 1 refills | Status: DC

## 2021-10-27 MED ORDER — ESZOPICLONE 1 MG PO TABS: 1.0000 mg | ORAL_TABLET | Freq: Every evening | ORAL | 0 refills | Status: DC | PRN

## 2021-10-27 MED ORDER — METOPROLOL SUCCINATE ER 100 MG PO TB24: 150.0000 mg | ORAL_TABLET | Freq: Every day | ORAL | 1 refills | Status: DC

## 2021-10-27 MED ORDER — ALBUTEROL SULFATE HFA 108 (90 BASE) MCG/ACT IN AERS: 2.0000 | INHALATION_SPRAY | Freq: Four times a day (QID) | RESPIRATORY_TRACT | 2 refills | Status: DC | PRN

## 2021-10-27 MED ORDER — TRELEGY ELLIPTA 100-62.5-25 MCG/ACT IN AEPB: 1.0000 | INHALATION_SPRAY | Freq: Every day | RESPIRATORY_TRACT | 11 refills | Status: DC

## 2021-10-27 MED ORDER — EZETIMIBE 10 MG PO TABS: 10.0000 mg | ORAL_TABLET | Freq: Every day | ORAL | 1 refills | Status: DC

## 2021-10-27 MED ORDER — NITROGLYCERIN 0.4 MG SL SUBL: 0.4000 mg | SUBLINGUAL_TABLET | SUBLINGUAL | 1 refills | Status: DC | PRN

## 2021-10-27 MED ORDER — OMEPRAZOLE 40 MG PO CPDR: 40.0000 mg | DELAYED_RELEASE_CAPSULE | Freq: Every day | ORAL | 1 refills | Status: DC

## 2021-10-27 NOTE — Assessment & Plan Note (Addendum)
-  Will change Ambien 5 mg to Lunesta 1 mg. Discussed with patient if 1 mg ineffective then can trial 2 mg (take 1 tablets of 1 mg). If sleep fails to improve then we can consider alternatives such as Seroquel. Will continue to monitor.

## 2021-10-27 NOTE — Assessment & Plan Note (Signed)
-  BP mildly elevated on intake. BP repeated and improved. Continue current medication regimen, see med list. Will continue to monitor.

## 2021-10-27 NOTE — Assessment & Plan Note (Signed)
-  No well controlled. Discussed with patient alternative medication therapies and will change Stiolto to Trelegy. Advised to let me know if inhaler ineffective or does not tolerate inhaler. Pt verbalized understanding.

## 2021-10-27 NOTE — Assessment & Plan Note (Signed)
-  Discussed with patient to schedule lab visit for routine fasting labs to collect lipid panel and hepatic function. Recommend to continue Zetia 10 mg and rosuvastatin 40 mg. Will continue to monitor.

## 2021-10-27 NOTE — Progress Notes (Signed)
Established patient visit   Patient: Andrew Townsend   DOB: 01-30-1951   71 y.o. Male  MRN: [id number] Visit Date: 10/27/2021  Chief Complaint  Patient presents with   COPD   Subjective    HPI  Patient presents for chronic follow-up.   Insomnia: Patient reports Ambien is no longer working. Having trouble with falling asleep.   HTN: Pt denies chest pain, palpitations, dizziness or leg swelling. Taking medication as directed without side effects.   HLD: Pt taking medication as directed without issues. No diet changes.   COPD: Patient reports having more trouble breathing. Stiolto is not as effective as before.   GERD: Patient reports having indigestion in the mornings which he attributes to eating late at night. Pantoprazole 40 mg has not been helpful.   Medications: Outpatient Medications Prior to Visit  Medication Sig   albuterol (PROVENTIL) (2.5 MG/3ML) 0.083% nebulizer solution INHALE THE CONTENTS OF 1 VIAL VIA NEBULIZER EVERY 6 HOURS AS NEEDED FOR WHEEZING OR SHORTNESS OF BREATH.   allopurinol (ZYLOPRIM) 100 MG tablet Take 100 mg by mouth daily.   apixaban (ELIQUIS) 2.5 MG TABS tablet Take 1 tablet (2.5 mg total) by mouth 2 (two) times daily.   buprenorphine (BUTRANS) 15 MCG/HR SMARTSIG:Patch(s) Topical Once a Week   furosemide (LASIX) 40 MG tablet Take 1 tablet (40 mg total) by mouth daily as needed for fluid.   gabapentin (NEURONTIN) 300 MG capsule Take 1 capsule (300 mg total) by mouth at bedtime.   oxybutynin (DITROPAN-XL) 10 MG 24 hr tablet Take 1 tablet (10 mg total) by mouth daily.   oxyCODONE-acetaminophen (PERCOCET) 10-325 MG tablet Take by mouth.   rosuvastatin (CRESTOR) 40 MG tablet Take 40 mg by mouth at bedtime.   tiZANidine (ZANAFLEX) 4 MG tablet Take 1 tablet (4 mg total) by mouth every 8 (eight) hours as needed.   [DISCONTINUED] ezetimibe (ZETIA) 10 MG tablet Take by mouth.   [DISCONTINUED] metoprolol succinate (TOPROL-XL) 100 MG 24 hr tablet Take 150 mg by mouth  daily.   [DISCONTINUED] nitroGLYCERIN (NITROSTAT) 0.4 MG SL tablet    [DISCONTINUED] pantoprazole (PROTONIX) 40 MG tablet Take 40 mg by mouth daily.   [DISCONTINUED] Potassium Chloride ER 20 MEQ TBCR Take 1 tablet by mouth daily.   [DISCONTINUED] STIOLTO RESPIMAT 2.5-2.5 MCG/ACT AERS INHALE 2 PUFFS INTO THE LUNGS DAILY.   [DISCONTINUED] zolpidem (AMBIEN) 5 MG tablet Take 1 tablet (5 mg total) by mouth at bedtime as needed.   No facility-administered medications prior to visit.    Review of Systems Review of Systems:  A fourteen system review of systems was performed and found to be positive as per HPI.   Last CBC Lab Results  Component Value Date   WBC 10.6 05/23/2021   HGB 12.4 (L) 05/23/2021   HCT 39.3 05/23/2021   MCV 88 05/23/2021   MCH 27.7 05/23/2021   RDW 15.6 (H) 05/23/2021   PLT 291 69/48/5462   Last metabolic panel Lab Results  Component Value Date   GLUCOSE 94 07/20/2021   NA 141 07/20/2021   K 4.4 07/20/2021   CL 104 07/20/2021   CO2 16 (L) 07/20/2021   BUN 22 07/20/2021   CREATININE 1.69 (H) 07/20/2021   EGFR 43 (L) 07/20/2021   CALCIUM 10.4 (H) 07/20/2021   PROT 6.9 07/20/2021   ALBUMIN 4.4 07/20/2021   LABGLOB 2.5 07/20/2021   AGRATIO 1.8 07/20/2021   BILITOT 0.7 07/20/2021   ALKPHOS 92 07/20/2021   AST 30 07/20/2021   ALT 16  07/20/2021   Last lipids No results found for: "CHOL", "HDL", "LDLCALC", "LDLDIRECT", "TRIG", "CHOLHDL" Last hemoglobin A1c No results found for: "HGBA1C" Last thyroid functions Lab Results  Component Value Date   TSH 4.020 05/23/2021   Last vitamin D No results found for: "25OHVITD2", "25OHVITD3", "VD25OH"   Objective    BP 125/72   Pulse 63   Temp 98.4 F (36.9 C)   Wt 128 lb 6.4 oz (58.2 kg)   SpO2 97%   BMI 20.72 kg/m  BP Readings from Last 3 Encounters:  10/27/21 125/72  09/19/21 138/77  09/12/21 132/70   Wt Readings from Last 3 Encounters:  10/27/21 128 lb 6.4 oz (58.2 kg)  09/19/21 128 lb (58.1 kg)   09/12/21 131 lb 6.4 oz (59.6 kg)    Physical Exam  General:  Pleasant and cooperative, appropriate for stated age.  Neuro:  Alert and oriented,  extra-ocular muscles intact  HEENT:  Normocephalic, atraumatic, neck supple  Skin:  no gross rash, warm, pink. Cardiac:  RRR, S1 S2 Respiratory: No wheezing, crackles or rales. Decreased air movement.  Vascular:  Ext warm, no cyanosis apprec.; cap RF less 2 sec. Psych:  No HI/SI, judgement and insight good, Euthymic mood. Full Affect.   No results found for any visits on 10/27/21.  Assessment & Plan      Problem List Items Addressed This Visit       Cardiovascular and Mediastinum   Hypertension - Primary    -BP mildly elevated on intake. BP repeated and improved. Continue current medication regimen, see med list. Will continue to monitor.      Relevant Medications   nitroGLYCERIN (NITROSTAT) 0.4 MG SL tablet   metoprolol succinate (TOPROL-XL) 100 MG 24 hr tablet   ezetimibe (ZETIA) 10 MG tablet     Respiratory   COPD (chronic obstructive pulmonary disease) (HCC)    -No well controlled. Discussed with patient alternative medication therapies and will change Stiolto to Trelegy. Advised to let me know if inhaler ineffective or does not tolerate inhaler. Pt verbalized understanding.       Relevant Medications   Fluticasone-Umeclidin-Vilant (TRELEGY ELLIPTA) 100-62.5-25 MCG/ACT AEPB   albuterol (VENTOLIN HFA) 108 (90 Base) MCG/ACT inhaler     Digestive   Gastroesophageal reflux disease   Relevant Medications   omeprazole (PRILOSEC) 40 MG capsule     Other   Hyperlipidemia    -Discussed with patient to schedule lab visit for routine fasting labs to collect lipid panel and hepatic function. Recommend to continue Zetia 10 mg and rosuvastatin 40 mg. Will continue to monitor.       Relevant Medications   nitroGLYCERIN (NITROSTAT) 0.4 MG SL tablet   metoprolol succinate (TOPROL-XL) 100 MG 24 hr tablet   ezetimibe (ZETIA) 10 MG  tablet   Insomnia    -Will change Ambien 5 mg to Lunesta 1 mg. Discussed with patient if 1 mg ineffective then can trial 2 mg (take 1 tablets of 1 mg). If sleep fails to improve then we can consider alternatives such as Seroquel. Will continue to monitor.      Relevant Medications   eszopiclone (LUNESTA) 1 MG TABS tablet   Other Visit Diagnoses     On potassium wasting diuretic therapy       Relevant Medications   Potassium Chloride ER 20 MEQ TBCR      GERD: -Discussed with patient adjunct therapy with H2 blocker with PPI for a couple of weeks or changing PPI medication. Will change pantoprazole  40 mg to omeprazole 40 mg. Also discussed avoid eating late at night to reduce exacerbations. Advised can take OTC famotidine 20 mg (Pepcid) if needed for significant symptoms. Pt verbalized understanding. Will continue to monitor.  Return in about 4 months (around 02/26/2022) for [facility]; lab visit for FBW in 1-4 weeks .        Lorrene Reid, PA-C  [facility] [phone] (phone) [phone] (fax)  [place]

## 2021-10-31 ENCOUNTER — Encounter (HOSPITAL_BASED_OUTPATIENT_CLINIC_OR_DEPARTMENT_OTHER): Payer: Medicare PPO | Admitting: Physical Therapy

## 2021-11-06 ENCOUNTER — Ambulatory Visit: Payer: Medicare PPO | Admitting: Physical Medicine and Rehabilitation

## 2021-11-06 ENCOUNTER — Ambulatory Visit (INDEPENDENT_AMBULATORY_CARE_PROVIDER_SITE_OTHER): Payer: Medicare PPO | Admitting: Physical Medicine and Rehabilitation

## 2021-11-06 ENCOUNTER — Encounter: Payer: Self-pay | Admitting: Physical Medicine and Rehabilitation

## 2021-11-06 VITALS — BP 132/67 | HR 67

## 2021-11-06 DIAGNOSIS — M25511 Pain in right shoulder: Secondary | ICD-10-CM

## 2021-11-06 DIAGNOSIS — M542 Cervicalgia: Secondary | ICD-10-CM | POA: Diagnosis not present

## 2021-11-06 DIAGNOSIS — G8929 Other chronic pain: Secondary | ICD-10-CM

## 2021-11-06 DIAGNOSIS — M7918 Myalgia, other site: Secondary | ICD-10-CM

## 2021-11-06 DIAGNOSIS — M25512 Pain in left shoulder: Secondary | ICD-10-CM

## 2021-11-06 NOTE — Progress Notes (Signed)
Andrew Townsend - 71 y.o. male MRN [id number]  Date of birth: 1950-11-08  Office Visit Note: Visit Date: 11/06/2021 PCP: Mayer Masker, PA-C Referred by: Huel Cote, MD  Subjective: Chief Complaint  Patient presents with   Neck - Pain   Right Shoulder - Pain   HPI: Andrew Townsend is a 71 y.o. male who comes in today at the request of Dr. Huel Cote for evaluation of chronic, worsening and severe right sided neck pain radiating down to trapezius region. Patient recently moved to area from [employer] [place], [place]. Patient reports pain has been ongoing for approximately 6 weeks. States his pain is exacerbated by movement and activity, describes as sore and tight sensation, currently rates as 5 out of 10. He reports some relief of pain with home exercise regimen, rest and use of medications. Patient recently finished regimen of formal physical therapy at [facility], he reports some relief of pain with these treatments. Patient states severe pain to right side of neck and trapezius region did limit ability to perform all PT exercises. No history of dry needling treatments. Patient does have history of right reverse shoulder arthroplasty performed by Dr. Charisse Klinefelter with [facility] the [place]. Patient states he has struggled with bilateral shoulder pain for years, was told that he also needs left shoulder replacement. Patient does have history of multiple left shoulder injections, most recent was 2020. Per Dr. Serena Croissant notes he would be a good candidate for left shoulder replacement. Patient denies focal weakness, numbness and tingling. Patient denies recent trauma or falls.   Incidentally, patient also mentioned chronic issues with bilateral lower back pain, right greater than left. Patient reports ongoing issues for several years, pain is exacerbated by walking and standing. Patient's CT of lumbar spine from 2019 exhibits moderate levoconvex curvature lumbar spine, multi-level facet  arthropathy, most severe at L4-L5 and L5-S1. Patient reports history of radiofrequency ablation many years ago that did help to alleviate pain, however pain relief was short lived and lasted approximately 3 months. No history of lumbar surgery. Patient does report being treated by pain management specialist while living in [place], states pain management provider told him he could be a candidate for spinal cord stimulator.    Review of Systems  Musculoskeletal:  Positive for myalgias and neck pain.  Neurological:  Negative for tingling, sensory change, focal weakness and weakness.  All other systems reviewed and are negative.  Otherwise per HPI.  Assessment & Plan: Visit Diagnoses:    ICD-10-CM   1. Cervicalgia  M54.2 Trigger Point Inj    2. Myofascial pain syndrome  M79.18 Trigger Point Inj    3. Chronic pain of both shoulders  M25.511 Trigger Point Inj   G89.29    M25.512        Plan: Findings:  1.  Chronic, worsening and severe right-sided neck pain radiating down to right trapezius region. No radicular symptoms radiating down right arm.  Patient continues to have severe pain despite good conservative therapy such as formal physical therapy, home exercise regimen, rest and use of medications.  Patient's clinical presentation and exam are consistent with myofascial pain.  He does have multiple palpable trigger points and tenderness noted to right trapezius muscle.  We believe the next step is to perform diagnostic and hopefully therapeutic myofascial trigger point injections.  We did complete this procedure in the office today, patient tolerated without complications.  I do feel patient would benefit from dry needling treatments which can be performed at [facility]  PT or with our in house team.  Patient encouraged to remain active and continue with conservative therapies at home. I did encourage patient to follow up with Dr. Sammuel Hines to further discuss treatment options. No red flag symptoms  noted upon exam today.   2. Chronic, worsening and severe bilateral lower back pain No radicular symptoms at this time. Pain worsens with standing and moving from sitting to standing position. Short term relief of pain with previous radiofrequency ablation procedure. We are happy to have patient come back for office visit regarding chronic lower back pain is his issues persist.. We would like to have his previous procedure records from [place]. Would consider ordering new imaging. Patient states he does have ICD, however he believes device is MRI compatible.     Meds & Orders: No orders of the defined types were placed in this encounter.   Orders Placed This Encounter  Procedures   Trigger Point Inj    Follow-up: Return if symptoms worsen or fail to improve.   Procedures: Trigger point injections performed to right trapezius muscle today, consent obtained by patient, injection was performed in a sterile fashion, needling technique utilized.   Clinical History: No specialty comments available.   He reports that he has quit smoking. His smoking use included cigarettes. He has never used smokeless tobacco. No results for input(s): "HGBA1C", "LABURIC" in the last 8760 hours.  Objective:  VS:  HT:    WT:   BMI:     BP:132/67  HR:67bpm  TEMP: ( )  RESP:  Physical Exam Vitals and nursing note reviewed.  HENT:     Head: Normocephalic and atraumatic.     Right Ear: External ear normal.     Left Ear: External ear normal.     Nose: Nose normal.     Mouth/Throat:     Mouth: Mucous membranes are moist.  Eyes:     Pupils: Pupils are equal, round, and reactive to light.  Cardiovascular:     Rate and Rhythm: Normal rate.     Pulses: Normal pulses.  Pulmonary:     Effort: Pulmonary effort is normal.  Abdominal:     General: Abdomen is flat. There is no distension.  Musculoskeletal:        General: Tenderness present.     Cervical back: Tenderness present.     Comments: No discomfort  noted with flexion, extension and side-to-side rotation. Patient has good strength in the upper extremities including 5 out of 5 strength in wrist extension, long finger flexion and APB.  There is no atrophy of the hands intrinsically.  Sensation intact bilaterally. Negative Hoffman's sign.   Pt rises from seated position to standing without difficulty. Concordant low back pain with facet loading, lumbar spine extension and rotation.  Strong distal strength without clonus, no pain upon palpation of greater trochanters. Multiple palpable trigger points noted to right trapezius muscle.Sensation intact bilaterally. Walks independently, gait steady.   Skin:    General: Skin is warm and dry.     Capillary Refill: Capillary refill takes less than 2 seconds.  Neurological:     General: No focal deficit present.     Mental Status: He is alert and oriented to person, place, and time.  Psychiatric:        Mood and Affect: Mood normal.        Behavior: Behavior normal.     Ortho Exam  Imaging: No results found.  Past Medical/Family/Surgical/Social History: Medications & Allergies reviewed per  EMR, new medications updated. 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) 06/19/2021   S/P CABG (coronary artery bypass graft) 05/26/2021   CAD (coronary artery disease) 05/26/2021   ICD (implantable cardioverter-defibrillator) in place 05/26/2021   COPD (chronic obstructive pulmonary disease) (HCC) 05/26/2021   Neuropathy 05/26/2021   Insomnia 05/26/2021   BPH (benign prostatic hyperplasia) 03/25/2021   Left rotator cuff tear 03/25/2021   RLL pneumonia 03/25/2021   Stage 3a chronic kidney disease (HCC) 06/16/2018   Hypertension 04/03/2018   Hyperlipidemia 04/03/2018   Acute kidney injury superimposed on chronic kidney disease (HCC) 04/03/2018   Anemia 04/03/2018   Chronic  pain 04/03/2018   Chronic systolic congestive heart failure (HCC) 04/03/2018   Gout 04/03/2018   Renal artery stenosis (HCC) 04/03/2018   Thrombocytopenia (HCC) 04/03/2018   Ischemic dilated cardiomyopathy (HCC) 06/29/2016   VT (ventricular tachycardia) (HCC) 06/28/2016   Calculus of kidney 02/20/2016   Past Medical History:  Diagnosis Date   CAD (coronary artery disease)    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 No pertinent family history. History reviewed. No pertinent surgical history. Social History   Occupational History   Not on file  Tobacco Use   Smoking status: Former    Types: Cigarettes   Smokeless tobacco: Never  Vaping Use   Vaping Use: Some days  Substance and Sexual Activity   Alcohol use: Not on file   Drug use: Never   Sexual activity: Not Currently

## 2021-11-06 NOTE — Progress Notes (Signed)
Pt state neck pain that travels down his right shoulder. Pt state bending and turning his head makes the pain worse. Pt has hx of neck surgery last year. Pt state he takes pain meds and uses heat to help ease his pain.  Numeric Pain Rating Scale and Functional Assessment Average Pain 10 Pain Right Now 4 My pain is constant, sharp, dull, stabbing, and aching Pain is worse with: bending, standing, and some activites Pain improves with: heat/ice and medication   In the last MONTH (on 0-10 scale) has pain interfered with the following?  1. General activity like being  able to carry out your everyday physical activities such as walking, climbing stairs, carrying groceries, or moving a chair?  Rating(5)  2. Relation with others like being able to carry out your usual social activities and roles such as  activities at home, at work and in your community. Rating(6)  3. Enjoyment of life such that you have  been bothered by emotional problems such as feeling anxious, depressed or irritable?  Rating(7)

## 2021-11-16 ENCOUNTER — Other Ambulatory Visit: Payer: Self-pay | Admitting: Physician Assistant

## 2021-11-16 ENCOUNTER — Telehealth: Payer: Self-pay | Admitting: Physician Assistant

## 2021-11-16 DIAGNOSIS — G629 Polyneuropathy, unspecified: Secondary | ICD-10-CM

## 2021-11-16 DIAGNOSIS — G4700 Insomnia, unspecified: Secondary | ICD-10-CM

## 2021-11-16 MED ORDER — ESZOPICLONE 2 MG PO TABS: 2.0000 mg | ORAL_TABLET | Freq: Every evening | ORAL | 0 refills | Status: DC | PRN

## 2021-11-16 NOTE — Telephone Encounter (Signed)
Patient states Kandis Cocking said it would be ok if he took 2 tablets of the Lunesta and so he has been now he is almost out and is requesting refill. Please advise.

## 2021-11-17 NOTE — Telephone Encounter (Signed)
Patient aware.

## 2021-11-27 ENCOUNTER — Telehealth: Payer: Self-pay | Admitting: Physician Assistant

## 2021-11-27 MED ORDER — ALLOPURINOL 100 MG PO TABS: 100.0000 mg | ORAL_TABLET | Freq: Every day | ORAL | 0 refills | Status: DC

## 2021-11-27 MED ORDER — TIZANIDINE HCL 4 MG PO TABS: 4.0000 mg | ORAL_TABLET | Freq: Three times a day (TID) | ORAL | 0 refills | Status: DC | PRN

## 2021-11-27 NOTE — Telephone Encounter (Signed)
Refills sent to pharmacy. AS, CMA 

## 2021-11-27 NOTE — Telephone Encounter (Signed)
Patient requesting refill on Allopurinol and Tizanidine. Please advise.

## 2021-12-05 ENCOUNTER — Ambulatory Visit: Payer: Medicare PPO | Admitting: Student in an Organized Health Care Education/Training Program

## 2021-12-07 ENCOUNTER — Encounter: Payer: Self-pay | Admitting: Student in an Organized Health Care Education/Training Program

## 2021-12-07 ENCOUNTER — Ambulatory Visit
Admission: RE | Admit: 2021-12-07 | Discharge: 2021-12-07 | Disposition: A | Discharge status: Home / Self Care | Payer: Medicare PPO | Source: Ambulatory Visit | Attending: Student in an Organized Health Care Education/Training Program | Admitting: Student in an Organized Health Care Education/Training Program

## 2021-12-07 ENCOUNTER — Ambulatory Visit (HOSPITAL_BASED_OUTPATIENT_CLINIC_OR_DEPARTMENT_OTHER): Payer: Medicare PPO | Admitting: Student in an Organized Health Care Education/Training Program

## 2021-12-07 VITALS — BP 164/92 | HR 71 | Temp 98.4°F | Resp 16 | Ht 66.0 in | Wt 133.0 lb

## 2021-12-07 DIAGNOSIS — M533 Sacrococcygeal disorders, not elsewhere classified: Secondary | ICD-10-CM | POA: Insufficient documentation

## 2021-12-07 DIAGNOSIS — M170 Bilateral primary osteoarthritis of knee: Secondary | ICD-10-CM | POA: Insufficient documentation

## 2021-12-07 DIAGNOSIS — Z9889 Other specified postprocedural states: Secondary | ICD-10-CM

## 2021-12-07 DIAGNOSIS — G894 Chronic pain syndrome: Secondary | ICD-10-CM | POA: Insufficient documentation

## 2021-12-07 DIAGNOSIS — M25552 Pain in left hip: Secondary | ICD-10-CM | POA: Insufficient documentation

## 2021-12-07 DIAGNOSIS — M25551 Pain in right hip: Secondary | ICD-10-CM | POA: Insufficient documentation

## 2021-12-07 DIAGNOSIS — Z95810 Presence of automatic (implantable) cardiac defibrillator: Secondary | ICD-10-CM | POA: Insufficient documentation

## 2021-12-07 DIAGNOSIS — I255 Ischemic cardiomyopathy: Secondary | ICD-10-CM | POA: Insufficient documentation

## 2021-12-07 DIAGNOSIS — M75102 Unspecified rotator cuff tear or rupture of left shoulder, not specified as traumatic: Secondary | ICD-10-CM | POA: Insufficient documentation

## 2021-12-07 DIAGNOSIS — M47816 Spondylosis without myelopathy or radiculopathy, lumbar region: Secondary | ICD-10-CM | POA: Insufficient documentation

## 2021-12-07 DIAGNOSIS — I2510 Atherosclerotic heart disease of native coronary artery without angina pectoris: Secondary | ICD-10-CM | POA: Insufficient documentation

## 2021-12-07 DIAGNOSIS — G629 Polyneuropathy, unspecified: Secondary | ICD-10-CM | POA: Insufficient documentation

## 2021-12-07 DIAGNOSIS — Z79891 Long term (current) use of opiate analgesic: Secondary | ICD-10-CM | POA: Insufficient documentation

## 2021-12-07 DIAGNOSIS — Z951 Presence of aortocoronary bypass graft: Secondary | ICD-10-CM | POA: Insufficient documentation

## 2021-12-07 DIAGNOSIS — N1831 Chronic kidney disease, stage 3a: Secondary | ICD-10-CM | POA: Insufficient documentation

## 2021-12-07 MED ORDER — GABAPENTIN 300 MG PO CAPS: 600.0000 mg | ORAL_CAPSULE | Freq: Every day | ORAL | 2 refills | Status: DC

## 2021-12-07 NOTE — Patient Instructions (Signed)
Instructed to get xrays

## 2021-12-07 NOTE — Progress Notes (Signed)
Safety precautions to be maintained throughout the outpatient stay will include: orient to surroundings, keep bed in low position, maintain call bell within reach at all times, provide assistance with transfer out of bed and ambulation.  

## 2021-12-07 NOTE — Progress Notes (Signed)
Patient: Andrew Townsend  Service Category: E/M  Provider: Bilal Lateef, MD  DOB: 02/03/1951  DOS: 12/07/2021  Referring Provider: Abonza, Maritza, PA-C  MRN: [id number]  Setting: Ambulatory outpatient  PCP: Abonza, Maritza, PA-C  Type: New Patient  Specialty: Interventional Pain Management    Location: Office  Delivery: Face-to-face     Primary Reason(s) for Visit: Encounter for initial evaluation of one or more chronic problems (new to examiner) potentially causing chronic pain, and posing a threat to normal musculoskeletal function. (Level of risk: High) CC: Back Pain, Leg Pain (bilat), and Shoulder Pain (bilat)  HPI  Andrew Townsend is a 71 y.o. year old, male patient, who comes for the first time to our practice referred by Abonza, Maritza, PA-C for our initial evaluation of his chronic pain. He has Hypertension; Stage 3a chronic kidney disease (HCC); S/P CABG (coronary artery bypass graft); CAD (coronary artery disease); Hyperlipidemia; ICD (implantable cardioverter-defibrillator) in place; COPD (chronic obstructive pulmonary disease) (HCC); Neuropathy; Insomnia; Acute kidney injury superimposed on chronic kidney disease (HCC); Anemia; BPH (benign prostatic hyperplasia); Calculus of kidney; Chronic pain; Chronic systolic congestive heart failure (HCC); Gout; Ischemic dilated cardiomyopathy (HCC); Left rotator cuff tear; Renal artery stenosis (HCC); RLL pneumonia; Thrombocytopenia (HCC); VT (ventricular tachycardia) (HCC); 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); Gastroesophageal reflux disease; Hx of shoulder surgery (left rotator cuff surgery, right rotator cuff then reverse total shoulder arthoplasty); Encounter for long-term opiate analgesic use; Lumbar facet arthropathy; Bilateral primary osteoarthritis of knee; and Sacroiliac joint pain on their problem list. Today he comes in for evaluation of his Back Pain, Leg Pain (bilat), and Shoulder Pain (bilat)  Pain  Assessment: Location: Lower, Right, Left (RIGHT side is worse) Back Radiating: through buttocks to back of thighs bilat Onset: More than a month ago Duration: Chronic pain Quality: Aching, Throbbing Severity: 5 /10 (subjective, self-reported pain score)  Effect on ADL: "can't go for walks" Timing: Constant Modifying factors: meds BP: (!) 164/92  HR: 71  Onset and Duration: Gradual and Present longer than 3 months Cause of pain: Unknown Severity: Getting worse, NAS-11 at its worse: 10/10, NAS-11 at its best: 2/10, NAS-11 now: 5/10, and NAS-11 on the average: 7/10 Timing: Morning, During activity or exercise, and After activity or exercise Aggravating Factors: Bending, Climbing, Intercourse (sex), Lifiting, Motion, Prolonged standing, Twisting, Walking, Walking uphill, and Walking downhill Alleviating Factors: Hot packs, Medications, Resting, and Warm showers or baths Associated Problems: Spasms and Tingling Quality of Pain: Aching, Agonizing, Disabling, and Distressing Previous Examinations or Tests: MRI scan, Nerve block, X-rays, Orthopedic evaluation, and Chiropractic evaluation Previous Treatments: Epidural steroid injections, Facet blocks, Narcotic medications, Physical Therapy, Radiofrequency, Steroid treatments by mouth, and TENS  Andrew Townsend is a pleasant 71-year-old male who has moved from [place] hoping to establish with pain management.  Patient has a history of bilateral shoulder pain status post left rotator cuff surgery x1, right rotator cuff surgery x2, low back pain related to lumbar spinal stenosis and lumbar degenerative disc disease and lumbar facet arthropathy.  He has been on chronic opioid therapy which includes Butrans patch at 15 mcg an hour along with Percocet 10 mg 3 times daily as needed.  He also takes gabapentin 300 mg nightly which she is hoping to increase the dose of.  He has difficulty ambulating.  He states that he has to sit down and rest given  weakness and pain in bilateral legs.  He also has a history of coronary artery disease status post CABG x4.  He   has a ICD in place along with COPD, stage IV chronic kidney disease for which she needs to establish with nephrologist.  He has a right renal artery stent in place.  He is complaining of bilateral hip pain, SI joint pain as well as bilateral knee pain.  He is on Eliquis for his cardiac disease.  Of note, he has had many injections done in the past at his previous pain clinic including diagnostic lumbar facet medial branch nerve blocks, subsequent lumbar radiofrequency ablations with limited response.  He is also had multiple epidurals with limited response as well.  He is open to interventional options for pain management.  Historic Controlled Substance Pharmacotherapy Review  PMP and historical list of controlled substances:  Butrans patch 15 mcg an hour, Percocet 10 mg 3 times daily as needed   Historical Monitoring: The patient  reports no history of drug use. List of prior UDS Testing: No results found for: "MDMA", "COCAINSCRNUR", "PCPSCRNUR", "PCPQUANT", "CANNABQUANT", "THCU", "ETH", "CBDTHCR", "D8THCCBX", "D9THCCBX" Historical Background Evaluation: [place] PMP: PDMP reviewed during this encounter. Review of the past 47-month conducted.              [place] Department of public safety, offender search: (Editor, commissioningInformation) Non-contributory Risk Assessment Profile: Aberrant behavior: None observed or detected today Risk factors for fatal opioid overdose: None identified today Fatal overdose hazard ratio (HR): Calculation deferred Non-fatal overdose hazard ratio (HR): Calculation deferred Risk of opioid abuse or dependence: 0.7-3.0% with doses ? 36 MME/day and 6.1-26% with doses ? 120 MME/day. Substance use disorder (SUD) risk level: See below Personal History of Substance Abuse (SUD-Substance use disorder):  Alcohol: Negative  Illegal Drugs: Negative  Rx Drugs: Negative  ORT Risk  Level calculation: Low Risk  Opioid Risk Tool - 12/07/21 1015       Family History of Substance Abuse   Alcohol Negative    Illegal Drugs Negative    Rx Drugs Negative      Personal History of Substance Abuse   Alcohol Negative    Illegal Drugs Negative    Rx Drugs Negative      Age   Age between 151-45years  No      History of Preadolescent Sexual Abuse   History of Preadolescent Sexual Abuse Negative or Male      Psychological Disease   Psychological Disease Negative    Depression Negative      Total Score   Opioid Risk Tool Scoring 0    Opioid Risk Interpretation Low Risk            ORT Scoring interpretation table:  Score <3 = Low Risk for SUD  Score between 4-7 = Moderate Risk for SUD  Score >8 = High Risk for Opioid Abuse   PHQ-2 Depression Scale:  Total score:    PHQ-2 Scoring interpretation table: (Score and probability of major depressive disorder)  Score 0 = No depression  Score 1 = 15.4% Probability  Score 2 = 21.1% Probability  Score 3 = 38.4% Probability  Score 4 = 45.5% Probability  Score 5 = 56.4% Probability  Score 6 = 78.6% Probability   PHQ-9 Depression Scale:  Total score:    PHQ-9 Scoring interpretation table:  Score 0-4 = No depression  Score 5-9 = Mild depression  Score 10-14 = Moderate depression  Score 15-19 = Moderately severe depression  Score 20-27 = Severe depression (2.4 times higher risk of SUD and 2.89 times higher risk of overuse)   Pharmacologic Plan: As  per protocol, I have not taken over any controlled substance management, pending the results of ordered tests and/or consults.            Initial impression: Pending review of available data and ordered tests.  Meds   Current Outpatient Medications:    albuterol (PROVENTIL) (2.5 MG/3ML) 0.083% nebulizer solution, INHALE THE CONTENTS OF 1 VIAL VIA NEBULIZER EVERY 6 HOURS AS NEEDED FOR WHEEZING OR SHORTNESS OF BREATH., Disp: 150 mL, Rfl: 0   albuterol (VENTOLIN HFA) 108  (90 Base) MCG/ACT inhaler, Inhale 2 puffs into the lungs every 6 (six) hours as needed for wheezing or shortness of breath., Disp: 8 g, Rfl: 2   allopurinol (ZYLOPRIM) 100 MG tablet, Take 1 tablet (100 mg total) by mouth daily., Disp: 90 tablet, Rfl: 0   apixaban (ELIQUIS) 2.5 MG TABS tablet, Take 1 tablet (2.5 mg total) by mouth 2 (two) times daily., Disp: 60 tablet, Rfl: 11   buprenorphine (BUTRANS) 15 MCG/HR, SMARTSIG:Patch(s) Topical Once a Week, Disp: , Rfl:    eszopiclone (LUNESTA) 2 MG TABS tablet, Take 1 tablet (2 mg total) by mouth at bedtime as needed for sleep. Take immediately before bedtime, Disp: 30 tablet, Rfl: 0   ezetimibe (ZETIA) 10 MG tablet, Take 1 tablet (10 mg total) by mouth daily., Disp: 90 tablet, Rfl: 1   Fluticasone-Umeclidin-Vilant (TRELEGY ELLIPTA) 100-62.5-25 MCG/ACT AEPB, Inhale 1 puff into the lungs daily., Disp: 1 each, Rfl: 11   furosemide (LASIX) 40 MG tablet, Take 1 tablet (40 mg total) by mouth daily as needed for fluid., Disp: 90 tablet, Rfl: 3   metoprolol succinate (TOPROL-XL) 100 MG 24 hr tablet, Take 1.5 tablets (150 mg total) by mouth daily., Disp: 135 tablet, Rfl: 1   Multiple Vitamin (MULTIVITAMIN) tablet, Take 1 tablet by mouth daily., Disp: , Rfl:    nitroGLYCERIN (NITROSTAT) 0.4 MG SL tablet, Place 1 tablet (0.4 mg total) under the tongue every 5 (five) minutes as needed for chest pain., Disp: 30 tablet, Rfl: 1   omeprazole (PRILOSEC) 40 MG capsule, Take 1 capsule (40 mg total) by mouth daily., Disp: 90 capsule, Rfl: 1   oxybutynin (DITROPAN-XL) 10 MG 24 hr tablet, Take 1 tablet (10 mg total) by mouth daily., Disp: 30 tablet, Rfl: 11   oxyCODONE-acetaminophen (PERCOCET) 10-325 MG tablet, Take by mouth., Disp: , Rfl:    Potassium Chloride ER 20 MEQ TBCR, Take 1 tablet by mouth daily., Disp: 90 tablet, Rfl: 1   rosuvastatin (CRESTOR) 40 MG tablet, Take 40 mg by mouth at bedtime., Disp: , Rfl:    tiZANidine (ZANAFLEX) 4 MG tablet, Take 1 tablet (4 mg  total) by mouth every 8 (eight) hours as needed., Disp: 90 tablet, Rfl: 0   gabapentin (NEURONTIN) 300 MG capsule, Take 2 capsules (600 mg total) by mouth at bedtime., Disp: 60 capsule, Rfl: 2  Imaging Review  DG Shoulder Right  Narrative CLINICAL DATA:  Bilateral shoulder pain  EXAM: RIGHT SHOULDER - 2+ VIEW  COMPARISON:  None.  FINDINGS: Mild AC joint degenerative change. Reverse right shoulder replacement with intact hardware and normal alignment.  IMPRESSION: Right shoulder replacement without acute osseous abnormality   Electronically Signed By: Kim  Fujinaga M.D. On: 06/01/2021 20:46  Shoulder-L DG: Results for orders placed during the hospital encounter of 06/01/21  DG Shoulder Left  Narrative CLINICAL DATA:  Shoulder pain  EXAM: LEFT SHOULDER - 2+ VIEW  COMPARISON:  None.  FINDINGS: Sternotomy and left-sided pacing device. No fracture or malalignment. High-riding humeral   head consistent with rotator cuff disease. AC joint appears widened, possibly due to prior postsurgical change. Advanced arthritis of the glenohumeral joint with bone on bone appearance.  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 High-riding humeral head consistent with rotator cuff disease   Electronically Signed By: Kim  Fujinaga M.D. On: 06/01/2021 20:47  Complexity Note: Imaging results reviewed. Results shared with Mr. Calip, using Layman's terms.                         ROS  Cardiovascular: Heart trouble, High blood pressure, Heart attack ( Date: 1990), Heart surgery, Pacemaker or defibrillator, and Blood thinners:  Anticoagulant Pulmonary or Respiratory: Lung problems, Shortness of breath, Smoking, and Snoring  Neurological: Stroke (Residual deficits or weakness: unknown) and Curved spine Psychological-Psychiatric: No reported psychological or psychiatric signs or symptoms such as difficulty sleeping, anxiety, depression, delusions or hallucinations  (schizophrenial), mood swings (bipolar disorders) or suicidal ideations or attempts Gastrointestinal: Reflux or heatburn Genitourinary: Kidney disease and Passing kidney stones Hematological: Brusing easily Endocrine: No reported endocrine signs or symptoms such as high or low blood sugar, rapid heart rate due to high thyroid levels, obesity or weight gain due to slow thyroid or thyroid disease Rheumatologic: No reported rheumatological signs and symptoms such as fatigue, joint pain, tenderness, swelling, redness, heat, stiffness, decreased range of motion, with or without associated rash Musculoskeletal: Negative for myasthenia gravis, muscular dystrophy, multiple sclerosis or malignant hyperthermia Work History: Retired  Allergies  Mr. Beining has No Known Allergies.  Laboratory Chemistry Profile   Renal Lab Results  Component Value Date   BUN 22 07/20/2021   CREATININE 1.69 (H) 07/20/2021   BCR 13 07/20/2021   SPECGRAV 1.020 06/07/2021   PHUR 5.5 06/07/2021   PROTEINUR Negative 06/07/2021     Electrolytes Lab Results  Component Value Date   NA 141 07/20/2021   K 4.4 07/20/2021   CL 104 07/20/2021   CALCIUM 10.4 (H) 07/20/2021     Hepatic Lab Results  Component Value Date   AST 30 07/20/2021   ALT 16 07/20/2021   ALBUMIN 4.4 07/20/2021   ALKPHOS 92 07/20/2021     ID No results found for: "LYMEIGGIGMAB", "HIV", "SARSCOV2NAA", "STAPHAUREUS", "MRSAPCR", "HCVAB", "PREGTESTUR", "RMSFIGG", "QFVRPH1IGG", "QFVRPH2IGG"   Bone No results found for: "VD25OH", "VD125OH2TOT", "VD3125OH2", "VD2125OH2", "25OHVITD1", "25OHVITD2", "25OHVITD3", "TESTOFREE", "TESTOSTERONE"   Endocrine Lab Results  Component Value Date   GLUCOSE 94 07/20/2021   GLUCOSEU Negative 06/07/2021   TSH 4.020 05/23/2021     Neuropathy No results found for: "VITAMINB12", "FOLATE", "HGBA1C", "HIV"   CNS No results found for: "COLORCSF", "APPEARCSF", "RBCCOUNTCSF", "WBCCSF", "POLYSCSF", "LYMPHSCSF",  "EOSCSF", "PROTEINCSF", "GLUCCSF", "JCVIRUS", "CSFOLI", "IGGCSF", "LABACHR", "ACETBL"   Inflammation (CRP: Acute  ESR: Chronic) No results found for: "CRP", "ESRSEDRATE", "LATICACIDVEN"   Rheumatology No results found for: "RF", "ANA", "LABURIC", "URICUR", "LYMEIGGIGMAB", "LYMEABIGMQN", "HLAB27"   Coagulation Lab Results  Component Value Date   PLT 291 05/23/2021     Cardiovascular Lab Results  Component Value Date   BNP 124.6 (H) 05/23/2021   HGB 12.4 (L) 05/23/2021   HCT 39.3 05/23/2021     Screening No results found for: "SARSCOV2NAA", "COVIDSOURCE", "STAPHAUREUS", "MRSAPCR", "HCVAB", "HIV", "PREGTESTUR"   Cancer No results found for: "CEA", "CA125", "LABCA2"   Allergens No results found for: "ALMOND", "APPLE", "ASPARAGUS", "AVOCADO", "BANANA", "BARLEY", "BASIL", "BAYLEAF", "GREENBEAN", "LIMABEAN", "WHITEBEAN", "BEEFIGE", "REDBEET", "BLUEBERRY", "BROCCOLI", "CABBAGE", "MELON", "CARROT", "CASEIN", "CASHEWNUT", "CAULIFLOWER", "CELERY"     Note: Lab results reviewed.    PFSH  Drug: Mr. Vigorito  reports no history of drug use. Alcohol:  has no history on file for alcohol use. Tobacco:  reports that he has been smoking e-cigarettes. He has never used smokeless tobacco. Medical:  has a past medical history of Arthritis, Blood transfusion without reported diagnosis, CAD (coronary artery disease), Cataract, Chronic kidney disease, COPD (chronic obstructive pulmonary disease) (HCC), GERD (gastroesophageal reflux disease), Hyperlipidemia, Hypertension, 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), Myocardial infarction (HCC), and Stroke (HCC). Family: family history is not on file.  Past Surgical History:  Procedure Laterality Date   CORONARY ARTERY BYPASS GRAFT     HERNIA REPAIR     Active Ambulatory Problems    Diagnosis Date Noted   Hypertension 04/03/2018   Stage 3a chronic kidney disease (HCC) 06/16/2018   S/P CABG (coronary artery bypass graft)  05/26/2021   CAD (coronary artery disease) 05/26/2021   Hyperlipidemia 04/03/2018   ICD (implantable cardioverter-defibrillator) in place 05/26/2021   COPD (chronic obstructive pulmonary disease) (HCC) 05/26/2021   Neuropathy 05/26/2021   Insomnia 05/26/2021   Acute kidney injury superimposed on chronic kidney disease (HCC) 04/03/2018   Anemia 04/03/2018   BPH (benign prostatic hyperplasia) 03/25/2021   Calculus of kidney 02/20/2016   Chronic pain 04/03/2018   Chronic systolic congestive heart failure (HCC) 04/03/2018   Gout 04/03/2018   Ischemic dilated cardiomyopathy (HCC) 06/29/2016   Left rotator cuff tear 03/25/2021   Renal artery stenosis (HCC) 04/03/2018   RLL pneumonia 03/25/2021   Thrombocytopenia (HCC) 04/03/2018   VT (ventricular tachycardia) (HCC) 06/28/2016   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) 06/19/2021   Gastroesophageal reflux disease 10/27/2021   Hx of shoulder surgery (left rotator cuff surgery, right rotator cuff then reverse total shoulder arthoplasty) 12/07/2021   Encounter for long-term opiate analgesic use 12/07/2021   Lumbar facet arthropathy 12/07/2021   Bilateral primary osteoarthritis of knee 12/07/2021   Sacroiliac joint pain 12/07/2021   Resolved Ambulatory Problems    Diagnosis Date Noted   No Resolved Ambulatory Problems   Past Medical History:  Diagnosis Date   Arthritis    Blood transfusion without reported diagnosis    Cataract    Chronic kidney disease    GERD (gastroesophageal reflux disease)    Myocardial infarction (HCC)    Stroke (HCC)    Constitutional Exam  General appearance: Well nourished, well developed, and well hydrated. In no apparent acute distress Vitals:   12/07/21 1017 12/07/21 1020  BP: (!) 159/67 (!) 164/92  Pulse: 71   Resp: 16   Temp: 98.4 F (36.9 C)   TempSrc: Temporal   SpO2: 98%   Weight: 133 lb (60.3 kg)   Height: 5' 6" (1.676 m)    BMI Assessment: Estimated body  mass index is 21.47 kg/m as calculated from the following:   Height as of this encounter: 5' 6" (1.676 m).   Weight as of this encounter: 133 lb (60.3 kg).  BMI interpretation table: BMI level Category Range association with higher incidence of chronic pain  <18 kg/m2 Underweight   18.5-24.9 kg/m2 Ideal body weight   25-29.9 kg/m2 Overweight Increased incidence by 20%  30-34.9 kg/m2 Obese (Class I) Increased incidence by 68%  35-39.9 kg/m2 Severe obesity (Class II) Increased incidence by 136%  >40 kg/m2 Extreme obesity (Class III) Increased incidence by 254%   Patient's current BMI Ideal Body weight  Body mass index is 21.47 kg/m. Ideal body weight: 63.8 kg (140 lb 10.5 oz)   BMI Readings from Last   4 Encounters:  12/07/21 21.47 kg/m  10/27/21 20.72 kg/m  09/19/21 20.66 kg/m  09/12/21 21.21 kg/m   Wt Readings from Last 4 Encounters:  12/07/21 133 lb (60.3 kg)  10/27/21 128 lb 6.4 oz (58.2 kg)  09/19/21 128 lb (58.1 kg)  09/12/21 131 lb 6.4 oz (59.6 kg)    Psych/Mental status: Alert, oriented x 3 (person, place, & time)       Eyes: PERLA Respiratory: No evidence of acute respiratory distress  Cervical Spine Area Exam  Skin & Axial Inspection: No masses, redness, edema, swelling, or associated skin lesions Alignment: Symmetrical Functional ROM: Unrestricted ROM      Stability: No instability detected Muscle Tone/Strength: Functionally intact. No obvious neuro-muscular anomalies detected. Sensory (Neurological): Referred pain pattern from rotator cuff dysfunction, bilateral Palpation: No palpable anomalies             Upper Extremity (UE) Exam    Side: Right upper extremity  Side: Left upper extremity  Skin & Extremity Inspection: Evidence of prior arthroplastic surgery  Skin & Extremity Inspection: Evidence of prior arthroplastic surgery  Functional ROM: Unrestricted ROM          Functional ROM: Unrestricted ROM          Muscle Tone/Strength: Functionally intact. No  obvious neuro-muscular anomalies detected.  Muscle Tone/Strength: Functionally intact. No obvious neuro-muscular anomalies detected.  Sensory (Neurological): Arthropathic arthralgia        and neurogenic  Sensory (Neurological): Arthropathic arthralgia        and neurogenic  Palpation: No palpable anomalies              Palpation: No palpable anomalies              Provocative Test(s):  Phalen's test: deferred Tinel's test: deferred Apley's scratch test (touch opposite shoulder):  Action 1 (Across chest): Decreased ROM Action 2 (Overhead): Decreased ROM Action 3 (LB reach): Decreased ROM   Provocative Test(s):  Phalen's test: deferred Tinel's test: deferred Apley's scratch test (touch opposite shoulder):  Action 1 (Across chest): Decreased ROM Action 2 (Overhead): Decreased ROM Action 3 (LB reach): Decreased ROM    Lumbar Spine Area Exam  Skin & Axial Inspection: No masses, redness, or swelling Alignment: Symmetrical Functional ROM: Pain restricted ROM       Stability: No instability detected Muscle Tone/Strength: Functionally intact. No obvious neuro-muscular anomalies detected. Sensory (Neurological): Dermatomal pain pattern and facet mediated  Gait & Posture Assessment  Ambulation: Unassisted Gait: Relatively normal for age and body habitus Posture: WNL  Lower Extremity Exam    Side: Right lower extremity  Side: Left lower extremity  Stability: No instability observed          Stability: No instability observed          Skin & Extremity Inspection: Skin color, temperature, and hair growth are WNL. No peripheral edema or cyanosis. No masses, redness, swelling, asymmetry, or associated skin lesions. No contractures.  Skin & Extremity Inspection: Skin color, temperature, and hair growth are WNL. No peripheral edema or cyanosis. No masses, redness, swelling, asymmetry, or associated skin lesions. No contractures.  Functional ROM: Pain restricted ROM for hip and knee joints           Functional ROM: Pain restricted ROM for hip and knee joints          Muscle Tone/Strength: Functionally intact. No obvious neuro-muscular anomalies detected.  Muscle Tone/Strength: Functionally intact. No obvious neuro-muscular anomalies detected.  Sensory (Neurological): Musculoskeletal pain pattern          Sensory (Neurological): Musculoskeletal pain pattern        DTR: Patellar: deferred today Achilles: deferred today Plantar: deferred today  DTR: Patellar: deferred today Achilles: deferred today Plantar: deferred today  Palpation: No palpable anomalies  Palpation: No palpable anomalies    Assessment  Primary Diagnosis & Pertinent Problem List: The primary encounter diagnosis was Chronic pain syndrome. Diagnoses of Hx of shoulder surgery (left rotator cuff surgery, right rotator cuff then reverse total shoulder arthoplasty), 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), Tear of left rotator cuff, unspecified tear extent, unspecified whether traumatic, S/P CABG (coronary artery bypass graft), Encounter for long-term opiate analgesic use, Coronary artery disease involving native coronary artery of native heart without angina pectoris, Stage 3a chronic kidney disease (HCC), Neuropathy, Lumbar facet arthropathy, Bilateral primary osteoarthritis of knee, Sacroiliac joint pain, and Bilateral hip pain were also pertinent to this visit.  Visit Diagnosis (New problems to examiner): 1. Chronic pain syndrome   2. Hx of shoulder surgery (left rotator cuff surgery, right rotator cuff then reverse total shoulder arthoplasty)   3. 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)   4. Tear of left rotator cuff, unspecified tear extent, unspecified whether traumatic   5. S/P CABG (coronary artery bypass graft)   6. Encounter for long-term opiate analgesic use   7. Coronary artery disease involving native coronary artery of native heart without angina  pectoris   8. Stage 3a chronic kidney disease (HCC)   9. Neuropathy   10. Lumbar facet arthropathy   11. Bilateral primary osteoarthritis of knee   12. Sacroiliac joint pain   13. Bilateral hip pain    Plan of Care (Initial workup plan)  Note: Mr. Dimercurio was reminded that as per protocol, today's visit has been an evaluation only. We have not taken over the patient's controlled substance management.   Had an extensive discussion with the patient regarding pain management options.  Do not recommend any further escalation of Percocet.  Increase gabapentin to 600 mg nightly.  Future considerations also include titration of Butrans patch to 20 mcg.  Do not recommend any NSAIDs given history of cardiac and renal disease.  Interventional options include suprascapular nerve block and possible pulsed radiofrequency ablation versus peripheral nerve stimulation.  We also discussed spinal cord stimulation for persistent axial low back pain and leg pain related to lower extremity neuropathy.  We will update imaging studies.  Follow-up in 2 weeks to review labs, imaging studies, sign pain contract and discuss interventional pain management plan.  1. Chronic pain syndrome - Compliance Drug Analysis, Ur  2. Hx of shoulder surgery (left rotator cuff surgery, right rotator cuff then reverse total shoulder arthoplasty) - DG Shoulder Left; Future - DG Shoulder Right; Future  3. 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)  4. Tear of left rotator cuff, unspecified tear extent, unspecified whether traumatic - DG Shoulder Left; Future - DG Shoulder Right; Future  5. S/P CABG (coronary artery bypass graft)  6. Encounter for long-term opiate analgesic use  7. Coronary artery disease involving native coronary artery of native heart without angina pectoris  8. Stage 3a chronic kidney disease (HCC) - Ambulatory referral to Nephrology  9. Neuropathy - gabapentin (NEURONTIN) 300  MG capsule; Take 2 capsules (600 mg total) by mouth at bedtime.  Dispense: 60 capsule; Refill: 2  10. Lumbar facet arthropathy - DG Lumbar Spine Complete W/Bend; Future  11. Bilateral primary osteoarthritis of knee - DG Knee 1-2 Views Right; Future - DG Knee 1-2 Views Left;   Future  12. Sacroiliac joint pain - DG Si Joints; Future - DG HIP UNILAT W OR W/O PELVIS 2-3 VIEWS RIGHT; Future - DG HIP UNILAT W OR W/O PELVIS 2-3 VIEWS LEFT; Future  13. Bilateral hip pain - DG HIP UNILAT W OR W/O PELVIS 2-3 VIEWS LEFT; Future   Lab Orders         Compliance Drug Analysis, Ur    Imaging Orders         DG Lumbar Spine Complete W/Bend         DG Si Joints         DG Shoulder Left         DG Shoulder Right         DG Knee 1-2 Views Right         DG Knee 1-2 Views Left         DG HIP UNILAT W OR W/O PELVIS 2-3 VIEWS RIGHT         DG HIP UNILAT W OR W/O PELVIS 2-3 VIEWS LEFT    Referral Orders         Ambulatory referral to Nephrology     Pharmacotherapy (current): Medications ordered:  Meds ordered this encounter  Medications   gabapentin (NEURONTIN) 300 MG capsule    Sig: Take 2 capsules (600 mg total) by mouth at bedtime.    Dispense:  60 capsule    Refill:  2   Medications administered during this visit: [name] had no medications administered during this visit.   Pharmacological management options:  Opioid Analgesics: The patient was informed that there is no guarantee that he would be a candidate for opioid analgesics. The decision will be made following CDC guidelines. This decision will be based on the results of diagnostic studies, as well as Mr. Rodriguez risk profile.   Membrane stabilizer:  Increase gabapentin to 600 mg nightly  Muscle relaxant: Adequate regimen  NSAID: Medically contraindicated  Other analgesic(s): To be determined at a later time   Interventional management options: Mr. Wymer was informed that there is no guarantee that he would be a candidate for  interventional therapies. The decision will be based on the results of diagnostic studies, as well as Mr. Ostermiller risk profile.  Procedure(s) under consideration:  Pending results of ordered studies      Provider-requested follow-up: Return in about 2 weeks (around 12/21/2021) for Medication Management, in person.  I spent a total of 60 minutes reviewing chart data, face-to-face evaluation with the patient, counseling and coordination of care as detailed above.   Future Appointments  Date Time Provider [place]  12/12/2021  7:00 AM [facility] DEVICE REMOTES [facility]  12/21/2021  2:40 PM Gillis Santa, MD [facility] None  12/27/2021  1:00 PM Vanetta Mulders, MD [facility]  01/01/2022 11:00 AM Buford Dresser, MD [facility]  03/13/2022  7:00 AM [facility] DEVICE REMOTES [facility]  06/12/2022  7:00 AM [facility] DEVICE REMOTES [facility]  07/25/2022 10:30 AM Billey Co, MD [facility] None  09/11/2022  7:00 AM [facility] DEVICE REMOTES [facility]    Note by: Gillis Santa, MD Date: 12/07/2021; Time: 1:11 PM

## 2021-12-11 LAB — COMPLIANCE DRUG ANALYSIS, UR

## 2021-12-12 ENCOUNTER — Ambulatory Visit (INDEPENDENT_AMBULATORY_CARE_PROVIDER_SITE_OTHER): Payer: Medicare PPO

## 2021-12-12 DIAGNOSIS — I420 Dilated cardiomyopathy: Secondary | ICD-10-CM

## 2021-12-12 DIAGNOSIS — I255 Ischemic cardiomyopathy: Secondary | ICD-10-CM

## 2021-12-13 LAB — CUP PACEART REMOTE DEVICE CHECK
Battery Remaining Longevity: 55 mo
Battery Voltage: 2.98 V
Brady Statistic RV Percent Paced: 1.07 %
Date Time Interrogation Session: 20230725022605
HighPow Impedance: 65 Ohm
Implantable Lead Implant Date: 20180209
Implantable Lead Location: 753860
Implantable Lead Model: 6935
Implantable Pulse Generator Implant Date: 20180209
Lead Channel Impedance Value: 285 Ohm
Lead Channel Impedance Value: 342 Ohm
Lead Channel Sensing Intrinsic Amplitude: 2.25 mV
Lead Channel Sensing Intrinsic Amplitude: 2.25 mV
Lead Channel Setting Pacing Amplitude: 2 V
Lead Channel Setting Pacing Pulse Width: 0.5 ms
Lead Channel Setting Sensing Sensitivity: 0.3 mV

## 2021-12-15 ENCOUNTER — Other Ambulatory Visit: Payer: Self-pay | Admitting: Physician Assistant

## 2021-12-15 DIAGNOSIS — G4700 Insomnia, unspecified: Secondary | ICD-10-CM

## 2021-12-18 ENCOUNTER — Telehealth: Payer: Self-pay | Admitting: Physician Assistant

## 2021-12-18 DIAGNOSIS — E785 Hyperlipidemia, unspecified: Secondary | ICD-10-CM

## 2021-12-18 MED ORDER — ROSUVASTATIN CALCIUM 40 MG PO TABS: 40.0000 mg | ORAL_TABLET | Freq: Every day | ORAL | 0 refills | Status: DC

## 2021-12-18 NOTE — Telephone Encounter (Signed)
Patient requesting refill of Crestor. This will be the first time prescribed here due to him being a new patient. Please advise.

## 2021-12-21 ENCOUNTER — Encounter: Payer: Self-pay | Admitting: Student in an Organized Health Care Education/Training Program

## 2021-12-21 ENCOUNTER — Other Ambulatory Visit: Payer: Self-pay

## 2021-12-21 ENCOUNTER — Ambulatory Visit
Payer: Medicare PPO | Attending: Student in an Organized Health Care Education/Training Program | Admitting: Student in an Organized Health Care Education/Training Program

## 2021-12-21 VITALS — BP 146/70 | HR 56 | Temp 97.5°F | Resp 18 | Ht 66.0 in | Wt 133.0 lb

## 2021-12-21 DIAGNOSIS — M4125 Other idiopathic scoliosis, thoracolumbar region: Secondary | ICD-10-CM | POA: Insufficient documentation

## 2021-12-21 DIAGNOSIS — M47816 Spondylosis without myelopathy or radiculopathy, lumbar region: Secondary | ICD-10-CM | POA: Insufficient documentation

## 2021-12-21 DIAGNOSIS — G5703 Lesion of sciatic nerve, bilateral lower limbs: Secondary | ICD-10-CM | POA: Diagnosis present

## 2021-12-21 DIAGNOSIS — Z0289 Encounter for other administrative examinations: Secondary | ICD-10-CM | POA: Diagnosis present

## 2021-12-21 DIAGNOSIS — M47818 Spondylosis without myelopathy or radiculopathy, sacral and sacrococcygeal region: Secondary | ICD-10-CM | POA: Diagnosis present

## 2021-12-21 DIAGNOSIS — G894 Chronic pain syndrome: Secondary | ICD-10-CM | POA: Diagnosis present

## 2021-12-21 MED ORDER — OXYCODONE-ACETAMINOPHEN 10-325 MG PO TABS: 1.0000 | ORAL_TABLET | Freq: Three times a day (TID) | ORAL | 0 refills | Status: AC | PRN

## 2021-12-21 MED ORDER — BUPRENORPHINE 20 MCG/HR TD PTWK: 1.0000 | MEDICATED_PATCH | TRANSDERMAL | 0 refills | Status: AC

## 2021-12-21 NOTE — Progress Notes (Signed)
Safety precautions to be maintained throughout the outpatient stay will include: orient to surroundings, keep bed in low position, maintain call bell within reach at all times, provide assistance with transfer out of bed and ambulation.  

## 2021-12-21 NOTE — Patient Instructions (Addendum)
SI JOINT injection and Piriformis injection for SI joint arthritis and piriformis pain  Sign pain contract

## 2021-12-21 NOTE — Progress Notes (Signed)
PROVIDER NOTE: Information contained herein reflects review and annotations entered in association with encounter. Interpretation of such information and data should be left to medically-trained personnel. Information provided to patient can be located elsewhere in the medical record under "Patient Instructions". Document created using STT-dictation technology, any transcriptional errors that may result from process are unintentional.    Patient: Andrew Townsend  Service Category: E/M  Provider: Gillis Santa, MD  DOB: [birth year]  DOS: 12/21/2021  Referring Provider: Ellouise Newer  MRN: [id number]  Specialty: Interventional Pain Management  PCP: Lorrene Reid, PA-C  Type: Established Patient  Setting: Ambulatory outpatient    Location: Office  Delivery: Face-to-face     Primary Reason(s) for Visit: Encounter for evaluation before starting new chronic pain management plan of care (Level of risk: moderate) CC: Back Pain (low)  HPI  [name] is a 71 y.o. year old, male patient, who comes today for a follow-up evaluation to review the test results and decide on a treatment plan. He has Hypertension; Stage 3a chronic kidney disease ([place]); S/P CABG (coronary artery bypass graft); CAD (coronary artery disease); Hyperlipidemia; ICD (implantable cardioverter-defibrillator) in place; COPD (chronic obstructive pulmonary disease) ([place]); Neuropathy; Insomnia; Acute kidney injury superimposed on chronic kidney disease ([place]); Anemia; BPH (benign prostatic hyperplasia); Calculus of kidney; Chronic pain; Chronic systolic congestive heart failure ([place]); Gout; Ischemic dilated cardiomyopathy ([place]); Left rotator cuff tear; Renal artery stenosis ([place]); RLL pneumonia; Thrombocytopenia ([place]); VT (ventricular tachycardia) ([place]); 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); Gastroesophageal reflux disease; Hx of shoulder surgery (left rotator cuff surgery, right rotator cuff then reverse total  shoulder arthoplasty); Encounter for long-term opiate analgesic use; Lumbar facet arthropathy; Bilateral primary osteoarthritis of knee; Sacroiliac joint pain; Other idiopathic scoliosis, thoracolumbar region; 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 His primarily concern today is the Back Pain (low)  Pain Assessment: Location: Lower Back Radiating: buttocks Onset: 1 to 4 weeks ago Duration: Chronic pain Quality: Aching, Sharp, Shooting Severity: 7 /10 (subjective, self-reported pain score)  Effect on ADL:   Timing: Constant Modifying factors: medications BP: (!) 146/70  HR: (!) 56  [name] comes in today for a follow-up visit after his initial evaluation on 12/07/2021. Today we went over the results of his tests. These were explained in "Layman's terms". During today's appointment we went over my diagnostic impression, as well as the proposed treatment plan.  Today we reviewed his urine toxicology screen, medication management plan as well as x-rays  HPI from initial clinic note: Andrew Townsend is a pleasant 71 year old male who has moved from [facility] hoping to establish with pain management.  Patient has a history of bilateral shoulder pain status post left rotator cuff surgery x1, right rotator cuff surgery x2, low back pain related to lumbar spinal stenosis and lumbar degenerative disc disease and lumbar facet arthropathy.  He has been on chronic opioid therapy which includes Butrans patch at 15 mcg an hour along with Percocet 10 mg 3 times daily as needed.  He also takes gabapentin 300 mg nightly which she is hoping to increase the dose of.  He has difficulty ambulating.  He states that he has to sit down and rest given weakness and pain in bilateral legs.  He also has a history of coronary artery disease status post CABG x4.  He has a ICD in place along with COPD, stage IV chronic kidney disease for which she needs to  establish with nephrologist.  He has a right renal artery stent in place.  He is complaining of bilateral hip pain, SI joint pain as well as bilateral knee pain.  He is on Eliquis for his cardiac disease.  Of note, he has had many injections done in the past at his previous pain clinic including diagnostic lumbar facet medial branch nerve blocks, subsequent lumbar radiofrequency ablations with limited response.  He is also had multiple epidurals with limited response as well.  He is open to interventional options for pain management.  Controlled Substance Pharmacotherapy Assessment REMS (Risk Evaluation and Mitigation Strategy)  Opioid Analgesic: Butrans increased from 15 to 20 mcg an hour; continue with Percocet 10 mg 3 times daily as needed Pill Count: None expected due to no prior prescriptions written by our practice. Hart Rochester, RN  12/21/2021  2:00 PM  Signed Safety precautions to be maintained throughout the outpatient stay will include: orient to surroundings, keep bed in low position, maintain call bell within reach at all times, provide assistance with transfer out of bed and ambulation.    Pharmacokinetics: Liberation and absorption (onset of action): WNL Distribution (time to peak effect): WNL Metabolism and excretion (duration of action): WNL         Pharmacodynamics: Desired effects: Analgesia: [name] reports >50% benefit. Functional ability: Patient reports that medication allows him to accomplish basic ADLs Clinically meaningful improvement in function (CMIF): Sustained CMIF goals met Perceived effectiveness: Described as relatively effective, allowing for increase in activities of daily living (ADL) Undesirable effects: Side-effects or Adverse reactions: None reported Monitoring: [place] PMP: PDMP not reviewed this encounter. Online review of the past 40-monthperiod previously conducted. Not applicable at this point since we have not taken over the patient's medication  management yet. List of other Serum/Urine Drug Screening Test(s):  No results found for: "AMPHSCRSER", "BARBSCRSER", "BENZOSCRSER", "COCAINSCRSER", "COCAINSCRNUR", "PCPSCRSER", "THCSCRSER", "THCU", "CANNABQUANT", "OPIATESCRSER", "OXYSCRSER", "PROPOXSCRSER", "ETH", "CBDTHCR", "D8THCCBX", "D9THCCBX" List of all UDS test(s) done:  Lab Results  Component Value Date   SUMMARY Note 12/07/2021   Last UDS on record: Summary  Date Value Ref Range Status  12/07/2021 Note  Final    Comment:    ==================================================================== Compliance Drug Analysis, Ur ==================================================================== Test                             Result       Flag       Units  Drug Present and Declared for Prescription Verification   Oxycodone                      6752         EXPECTED   ng/mg creat   Oxymorphone                    1668         EXPECTED   ng/mg creat   Noroxycodone                   6775         EXPECTED   ng/mg creat   Noroxymorphone                 719          EXPECTED   ng/mg creat    Sources of oxycodone are scheduled prescription medications.    Oxymorphone, noroxycodone, and noroxymorphone are expected    metabolites of oxycodone. Oxymorphone  is also available as a    scheduled prescription medication.    Buprenorphine                  7            EXPECTED   ng/mg creat   Norbuprenorphine               9            EXPECTED   ng/mg creat    Source of buprenorphine is a scheduled prescription medication.    Norbuprenorphine is an expected metabolite of buprenorphine.    Gabapentin                     PRESENT      EXPECTED   Tizanidine                     PRESENT      EXPECTED   Zopiclone/Eszopiclone          PRESENT      EXPECTED    Eszopiclone is detected as zopiclone (racemic).    Acetaminophen                  PRESENT      EXPECTED   Metoprolol                     PRESENT      EXPECTED  Drug Present not Declared for  Prescription Verification   Ibuprofen                      PRESENT      UNEXPECTED ==================================================================== Test                      Result    Flag   Units      Ref Range   Creatinine              75               mg/dL      >=20 ==================================================================== Declared Medications:  The flagging and interpretation on this report are based on the  following declared medications.  Unexpected results may arise from  inaccuracies in the declared medications.   **Note: The testing scope of this panel includes these medications:   Gabapentin (Neurontin)  Metoprolol (Toprol)  Oxycodone (Percocet)   **Note: The testing scope of this panel does not include small to  moderate amounts of these reported medications:   Acetaminophen (Percocet)  Buprenorphine Patch (BuTrans)  Eszopiclone (Lunesta)  Tizanidine (Zanaflex)   **Note: The testing scope of this panel does not include the  following reported medications:   Albuterol (Ventolin HFA)  Allopurinol (Zyloprim)  Apixaban (Eliquis)  Ezetimibe (Zetia)  Fluticasone (Trelegy)  Furosemide (Lasix)  Nitroglycerin (Nitrostat)  Omeprazole (Prilosec)  Oxybutynin (Ditropan)  Potassium Chloride  Rosuvastatin (Crestor)  Umeclidinium (Trelegy)  Vilanterol (Trelegy) ==================================================================== For clinical consultation, please call [phone]. ====================================================================    UDS interpretation: No unexpected findings.          Medication Assessment Form: Not applicable. No opioids. Treatment compliance: Not applicable Risk Assessment Profile: Aberrant behavior: See initial evaluations. None observed or detected today Comorbid factors increasing risk of overdose: See initial evaluation. No additional risks detected today Opioid risk tool (ORT):     12/07/2021   10:15 AM   Opioid Risk   Alcohol 0  Illegal  Drugs 0  Rx Drugs 0  Alcohol 0  Illegal Drugs 0  Rx Drugs 0  Age between 16-45 years  0  History of Preadolescent Sexual Abuse 0  Psychological Disease 0  Depression 0  Opioid Risk Tool Scoring 0  Opioid Risk Interpretation Low Risk    ORT Scoring interpretation table:  Score <3 = Low Risk for SUD  Score between 4-7 = Moderate Risk for SUD  Score >8 = High Risk for Opioid Abuse   Risk of substance use disorder (SUD): Low  Risk Mitigation Strategies:  Patient opioid safety counseling: Completed today. Counseling provided to patient as per "Patient Counseling Document". Document signed by patient, attesting to counseling and understanding Patient-Prescriber Agreement (PPA): Obtained today.  Controlled substance notification to other providers: Written and sent today.  Pharmacologic Plan: Today we may be taking over the patient's pharmacological regimen. See below.             Laboratory Chemistry Profile   Renal Lab Results  Component Value Date   BUN 22 07/20/2021   CREATININE 1.69 (H) 07/20/2021   BCR 13 07/20/2021   SPECGRAV 1.020 06/07/2021   PHUR 5.5 06/07/2021   PROTEINUR Negative 06/07/2021     Electrolytes Lab Results  Component Value Date   NA 141 07/20/2021   K 4.4 07/20/2021   CL 104 07/20/2021   CALCIUM 10.4 (H) 07/20/2021     Hepatic Lab Results  Component Value Date   AST 30 07/20/2021   ALT 16 07/20/2021   ALBUMIN 4.4 07/20/2021   ALKPHOS 92 07/20/2021     ID No results found for: "LYMEIGGIGMAB", "HIV", "SARSCOV2NAA", "STAPHAUREUS", "MRSAPCR", "HCVAB", "PREGTESTUR", "RMSFIGG", "QFVRPH1IGG", "QFVRPH2IGG"   Bone No results found for: "VD25OH", "VD125OH2TOT", "KN3976BH4", "LP3790WI0", "25OHVITD1", "25OHVITD2", "25OHVITD3", "TESTOFREE", "TESTOSTERONE"   Endocrine Lab Results  Component Value Date   GLUCOSE 94 07/20/2021   GLUCOSEU Negative 06/07/2021   TSH 4.020 05/23/2021     Neuropathy No results  found for: "VITAMINB12", "FOLATE", "HGBA1C", "HIV"   CNS No results found for: "COLORCSF", "APPEARCSF", "RBCCOUNTCSF", "WBCCSF", "POLYSCSF", "LYMPHSCSF", "EOSCSF", "PROTEINCSF", "GLUCCSF", "JCVIRUS", "CSFOLI", "IGGCSF", "LABACHR", "ACETBL"   Inflammation (CRP: Acute  ESR: Chronic) No results found for: "CRP", "ESRSEDRATE", "LATICACIDVEN"   Rheumatology No results found for: "RF", "ANA", "LABURIC", "URICUR", "LYMEIGGIGMAB", "LYMEABIGMQN", "HLAB27"   Coagulation Lab Results  Component Value Date   PLT 291 05/23/2021     Cardiovascular Lab Results  Component Value Date   BNP 124.6 (H) 05/23/2021   HGB 12.4 (L) 05/23/2021   HCT 39.3 05/23/2021     Screening No results found for: "SARSCOV2NAA", "COVIDSOURCE", "STAPHAUREUS", "MRSAPCR", "HCVAB", "HIV", "PREGTESTUR"   Cancer No results found for: "CEA", "CA125", "LABCA2"   Allergens No results found for: "ALMOND", "APPLE", "ASPARAGUS", "AVOCADO", "BANANA", "BARLEY", "BASIL", "BAYLEAF", "GREENBEAN", "LIMABEAN", "WHITEBEAN", "BEEFIGE", "REDBEET", "BLUEBERRY", "BROCCOLI", "CABBAGE", "MELON", "CARROT", "CASEIN", "CASHEWNUT", "CAULIFLOWER", "CELERY"     Note: Lab results reviewed.  Recent Diagnostic Imaging Review   DG Shoulder Right  Narrative CLINICAL DATA:  Bilateral chronic shoulder pain.  EXAM: RIGHT SHOULDER - 2+ VIEW  COMPARISON:  Right shoulder radiographs 06/01/2021  FINDINGS: Postsurgical changes are again seen of reverse total right shoulder arthroplasty. No perihardware lucency is seen to indicate hardware failure or loosening. No acute fracture or dislocation.  Partial visualization of median sternotomy and CABG. Partial visualization of cardiac pacer.  IMPRESSION: Status post reverse total right shoulder arthroplasty without evidence of hardware failure.   Electronically Signed By: Yvonne Kendall M.D. On: 12/08/2021 11:21  Shoulder-L DG: Results for orders placed during the hospital encounter of  12/07/21  DG Shoulder Left  Narrative CLINICAL DATA:  Chronic bilateral shoulder pain. Prior bilateral shoulder surgery most recently on the left in 2022.  EXAM: LEFT SHOULDER - 2+ VIEW  COMPARISON:  Left shoulder radiographs 06/01/2021  FINDINGS: Severe glenohumeral joint space narrowing with bone-on-bone contact. Moderate peripheral glenoid and inferior humeral head-neck junction degenerative osteophytes. Mild to moderate medial humeral head and glenoid fossa cortical flattening/remodeling. Postsurgical changes of distal clavicle excision. No acute fracture or dislocation. Partial visualization of median sternotomy wires and CABG. Partial visualization of cardiac pacer. Mild to moderate calcification within the aortic arch.  IMPRESSION: Severe glenohumeral osteoarthritis.   Electronically Signed By: Yvonne Kendall M.D. On: 12/08/2021 11:20   Narrative CLINICAL DATA:  Chronic lower back pain radiating to the bilateral legs, right-greater-than-left.  EXAM: LUMBAR SPINE - COMPLETE WITH BENDING VIEWS  COMPARISON:  None Available.  FINDINGS: There is diffuse decreased bone mineralization. There are hypoplastic ribs at the vertebral body considered T12. Distal to this the next 5 vertebral bodies are considered L1 through L5. The superior aspect of the iliac crests is at the level of the mid to superior and mid to inferior aspect of the L4 vertebral body on lateral view.  There is 6 mm retrolisthesis of L1 on L2, unchanged on neutral, flexion, and extension views. Moderate to severe levocurvature centered at L2.  Severe right L1-2 and L2-3 and moderate right L3-4 and L4-5 disc space narrowing with multilevel degenerative endplate osteophytes. Moderate to severe L5-S1 disc space narrowing with vacuum phenomenon.  Facet joint arthropathy is greatest at L5-S1.  Partial visualization of cardiac AICD leads.  Hernia mesh coils overlie the pelvis.  Moderate  atherosclerotic calcifications within the abdominal aorta. A vascular stent overlies the L1-2 disc level.  IMPRESSION: 1. Moderate to severe levocurvature centered at L2. 2. Severe multilevel degenerative disc and endplate changes. 3. Mild retrolisthesis of L1 on L2.   Electronically Signed By: Yvonne Kendall M.D. On: 12/08/2021 11:18         Sacroiliac Joint Imaging: Sacroiliac Joint DG: Results for orders placed during the hospital encounter of 12/07/21  DG Si Joints  Narrative CLINICAL DATA:  Sacroiliac joint pain. Chronic lower back pain radiating to the bilateral legs.  EXAM: BILATERAL SACROILIAC JOINTS - 3+ VIEW  COMPARISON:  None Available.  FINDINGS: There is diffuse decreased bone mineralization. Mild mid to superior bilateral sacroiliac subchondral sclerosis and joint space narrowing. No subchondral erosions.  Mild bilateral femoroacetabular joint space narrowing. Moderate to severe left L4-5 and L5-S1 disc space narrowing. Hernia mesh coils overlie the pelvis. No acute fracture or dislocation.  IMPRESSION: Mild bilateral sacroiliac osteoarthritis.   Electronically Signed By: Yvonne Kendall M.D. On: 12/08/2021 11:12     Narrative CLINICAL DATA:  Chronic bilateral hip pain.  EXAM: DG HIP (WITH OR WITHOUT PELVIS) 2-3V RIGHT; DG HIP (WITH OR WITHOUT PELVIS) 2-3V LEFT  COMPARISON:  None Available.  FINDINGS: Mild bilateral sacroiliac joint space narrowing and subchondral sclerosis. Mild bilateral superolateral acetabular degenerative osteophytes. Mild bilateral femoroacetabular joint space narrowing. The pubic symphysis joint space is maintained. Diffuse decreased bone mineralization. No acute fracture is seen. No dislocation. Mild-to-moderate vascular calcifications. Hernia mesh coils overlie the pelvis.  IMPRESSION: Mild bilateral femoroacetabular osteoarthritis.   Electronically Signed By: Yvonne Kendall M.D. On: 12/08/2021  11:24  Narrative CLINICAL DATA:  Chronic bilateral hip pain.  EXAM: DG HIP (WITH OR WITHOUT PELVIS) 2-3V  RIGHT; DG HIP (WITH OR WITHOUT PELVIS) 2-3V LEFT  COMPARISON:  None Available.  FINDINGS: Mild bilateral sacroiliac joint space narrowing and subchondral sclerosis. Mild bilateral superolateral acetabular degenerative osteophytes. Mild bilateral femoroacetabular joint space narrowing. The pubic symphysis joint space is maintained. Diffuse decreased bone mineralization. No acute fracture is seen. No dislocation. Mild-to-moderate vascular calcifications. Hernia mesh coils overlie the pelvis.  IMPRESSION: Mild bilateral femoroacetabular osteoarthritis.   Electronically Signed By: Yvonne Kendall M.D. On: 12/08/2021 11:24   Complexity Note: Imaging results reviewed.                         Meds   Current Outpatient Medications:    albuterol (PROVENTIL) (2.5 MG/3ML) 0.083% nebulizer solution, INHALE THE CONTENTS OF 1 VIAL VIA NEBULIZER EVERY 6 HOURS AS NEEDED FOR WHEEZING OR SHORTNESS OF BREATH., Disp: 150 mL, Rfl: 0   albuterol (VENTOLIN HFA) 108 (90 Base) MCG/ACT inhaler, Inhale 2 puffs into the lungs every 6 (six) hours as needed for wheezing or shortness of breath., Disp: 8 g, Rfl: 2   allopurinol (ZYLOPRIM) 100 MG tablet, Take 1 tablet (100 mg total) by mouth daily., Disp: 90 tablet, Rfl: 0   apixaban (ELIQUIS) 2.5 MG TABS tablet, Take 1 tablet (2.5 mg total) by mouth 2 (two) times daily., Disp: 60 tablet, Rfl: 11   buprenorphine (BUTRANS) 15 MCG/HR, SMARTSIG:Patch(s) Topical Once a Week, Disp: , Rfl:    buprenorphine (BUTRANS) 20 MCG/HR PTWK, Place 1 patch onto the skin once a week for 28 days., Disp: 4 patch, Rfl: 0   eszopiclone (LUNESTA) 2 MG TABS tablet, TAKE 1 TABLET (2 MG TOTAL) BY MOUTH AT BEDTIME AS NEEDED FOR SLEEP. TAKE IMMEDIATELY BEFORE BEDTIME, Disp: 30 tablet, Rfl: 0   ezetimibe (ZETIA) 10 MG tablet, Take 1 tablet (10 mg total) by mouth daily., Disp: 90  tablet, Rfl: 1   Fluticasone-Umeclidin-Vilant (TRELEGY ELLIPTA) 100-62.5-25 MCG/ACT AEPB, Inhale 1 puff into the lungs daily., Disp: 1 each, Rfl: 11   furosemide (LASIX) 40 MG tablet, Take 1 tablet (40 mg total) by mouth daily as needed for fluid., Disp: 90 tablet, Rfl: 3   gabapentin (NEURONTIN) 300 MG capsule, Take 2 capsules (600 mg total) by mouth at bedtime., Disp: 60 capsule, Rfl: 2   metoprolol succinate (TOPROL-XL) 100 MG 24 hr tablet, Take 1.5 tablets (150 mg total) by mouth daily., Disp: 135 tablet, Rfl: 1   Multiple Vitamin (MULTIVITAMIN) tablet, Take 1 tablet by mouth daily., Disp: , Rfl:    nitroGLYCERIN (NITROSTAT) 0.4 MG SL tablet, Place 1 tablet (0.4 mg total) under the tongue every 5 (five) minutes as needed for chest pain., Disp: 30 tablet, Rfl: 1   omeprazole (PRILOSEC) 40 MG capsule, Take 1 capsule (40 mg total) by mouth daily., Disp: 90 capsule, Rfl: 1   oxybutynin (DITROPAN-XL) 10 MG 24 hr tablet, Take 1 tablet (10 mg total) by mouth daily., Disp: 30 tablet, Rfl: 11   Potassium Chloride ER 20 MEQ TBCR, Take 1 tablet by mouth daily., Disp: 90 tablet, Rfl: 1   rosuvastatin (CRESTOR) 40 MG tablet, Take 1 tablet (40 mg total) by mouth at bedtime., Disp: 90 tablet, Rfl: 0   tiZANidine (ZANAFLEX) 4 MG tablet, Take 1 tablet (4 mg total) by mouth every 8 (eight) hours as needed., Disp: 90 tablet, Rfl: 0   oxyCODONE-acetaminophen (PERCOCET) 10-325 MG tablet, Take 1 tablet by mouth every 8 (eight) hours as needed for pain., Disp: 90 tablet, Rfl: 0  ROS  Constitutional: Denies any fever or chills Gastrointestinal: No reported hemesis, hematochezia, vomiting, or acute GI distress Musculoskeletal:  Bilateral shoulder pain, low back pain, buttock pain, piriformis pain, hip pain, knee pain Neurological: No reported episodes of acute onset apraxia, aphasia, dysarthria, agnosia, amnesia, paralysis, loss of coordination, or loss of consciousness  Allergies  Mr. Ramcharan has No Known  Allergies.  [place]  Drug: Mr. Wisler  reports no history of drug use. Alcohol:  has no history on file for alcohol use. Tobacco:  reports that he has been smoking e-cigarettes. He has never used smokeless tobacco. Medical:  has a past medical history of Arthritis, Blood transfusion without reported diagnosis, CAD (coronary artery disease), Cataract, Chronic kidney disease, COPD (chronic obstructive pulmonary disease) ([place]), GERD (gastroesophageal reflux disease), Hyperlipidemia, Hypertension, 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), Myocardial infarction ([place]), and Stroke ([place]). Surgical: Mr. Ohms  has a past surgical history that includes Coronary artery bypass graft and Hernia repair. Family: family history is not on file.  Constitutional Exam  General appearance: Well nourished, well developed, and well hydrated. In no apparent acute distress Vitals:   12/21/21 1354  BP: (!) 146/70  Pulse: (!) 56  Resp: 18  Temp: (!) 97.5 F (36.4 C)  TempSrc: Temporal  SpO2: 99%  Weight: 133 lb (60.3 kg)  Height: 5' 6"  (1.676 m)   BMI Assessment: Estimated body mass index is 21.47 kg/m as calculated from the following:   Height as of this encounter: 5' 6"  (1.676 m).   Weight as of this encounter: 133 lb (60.3 kg).  BMI interpretation table: BMI level Category Range association with higher incidence of chronic pain  <18 kg/m2 Underweight   18.5-24.9 kg/m2 Ideal body weight   25-29.9 kg/m2 Overweight Increased incidence by 20%  30-34.9 kg/m2 Obese (Class I) Increased incidence by 68%  35-39.9 kg/m2 Severe obesity (Class II) Increased incidence by 136%  >40 kg/m2 Extreme obesity (Class III) Increased incidence by 254%   Patient's current BMI Ideal Body weight  Body mass index is 21.47 kg/m. Ideal body weight: 63.8 kg (140 lb 10.5 oz)   BMI Readings from Last 4 Encounters:  12/21/21 21.47 kg/m  12/07/21 21.47 kg/m  10/27/21 20.72 kg/m  09/19/21 20.66  kg/m   Wt Readings from Last 4 Encounters:  12/21/21 133 lb (60.3 kg)  12/07/21 133 lb (60.3 kg)  10/27/21 128 lb 6.4 oz (58.2 kg)  09/19/21 128 lb (58.1 kg)    Psych/Mental status: Alert, oriented x 3 (person, place, & time)       Eyes: PERLA Respiratory: No evidence of acute respiratory distress    Cervical Spine Area Exam  Skin & Axial Inspection: No masses, redness, edema, swelling, or associated skin lesions Alignment: Symmetrical Functional ROM: Unrestricted ROM      Stability: No instability detected Muscle Tone/Strength: Functionally intact. No obvious neuro-muscular anomalies detected. Sensory (Neurological): Referred pain pattern from rotator cuff dysfunction, bilateral Palpation: No palpable anomalies             Upper Extremity (UE) Exam      Side: Right upper extremity   Side: Left upper extremity  Skin & Extremity Inspection: Evidence of prior arthroplastic surgery   Skin & Extremity Inspection: Evidence of prior arthroplastic surgery  Functional ROM: Unrestricted ROM           Functional ROM: Unrestricted ROM          Muscle Tone/Strength: Functionally intact. No obvious neuro-muscular anomalies detected.   Muscle Tone/Strength: Functionally intact.  No obvious neuro-muscular anomalies detected.  Sensory (Neurological): Arthropathic arthralgia        and neurogenic   Sensory (Neurological): Arthropathic arthralgia        and neurogenic  Palpation: No palpable anomalies               Palpation: No palpable anomalies              Provocative Test(s):  Phalen's test: deferred Tinel's test: deferred Apley's scratch test (touch opposite shoulder):  Action 1 (Across chest): Decreased ROM Action 2 (Overhead): Decreased ROM Action 3 (LB reach): Decreased ROM     Provocative Test(s):  Phalen's test: deferred Tinel's test: deferred Apley's scratch test (touch opposite shoulder):  Action 1 (Across chest): Decreased ROM Action 2 (Overhead): Decreased ROM Action 3 (LB  reach): Decreased ROM      Lumbar Spine Area Exam  Skin & Axial Inspection: No masses, redness, or swelling Alignment: Symmetrical Functional ROM: Pain restricted ROM       Stability: No instability detected Muscle Tone/Strength: Functionally intact. No obvious neuro-muscular anomalies detected. Sensory (Neurological): Dermatomal pain pattern and musculoskeletal Palpation: No palpable anomalies       Provocative Tests: Hyperextension/rotation test: deferred today       Lumbar quadrant test (Kemp's test): deferred today       Lateral bending test: deferred today       Patrick's Maneuver: (+) for bilateral S-I arthralgia             FABER* test: +) for bilateral S-I arthralgia             S-I anterior distraction/compression test: +) for bilateral S-I arthralgia             S-I lateral compression test: +) for bilateral S-I arthralgia             S-I Thigh-thrust test: +) for bilateral S-I arthralgia             S-I Gaenslen's test: +) for bilateral S-I arthralgia             *(Flexion, ABduction and External Rotation)   Gait & Posture Assessment  Ambulation: Unassisted Gait: Relatively normal for age and body habitus Posture: WNL  Lower Extremity Exam      Side: Right lower extremity   Side: Left lower extremity  Stability: No instability observed           Stability: No instability observed          Skin & Extremity Inspection: Skin color, temperature, and hair growth are WNL. No peripheral edema or cyanosis. No masses, redness, swelling, asymmetry, or associated skin lesions. No contractures.   Skin & Extremity Inspection: Skin color, temperature, and hair growth are WNL. No peripheral edema or cyanosis. No masses, redness, swelling, asymmetry, or associated skin lesions. No contractures.  Functional ROM: Pain restricted ROM for hip and knee joints           Functional ROM: Pain restricted ROM for hip and knee joints          Muscle Tone/Strength: Functionally intact. No obvious  neuro-muscular anomalies detected.   Muscle Tone/Strength: Functionally intact. No obvious neuro-muscular anomalies detected.  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 Diagnoses of Piriformis syndrome of both sides, Lumbar facet arthropathy, Other idiopathic scoliosis, thoracolumbar region, Pain management contract signed, and Chronic pain syndrome were also pertinent to this visit.  Visit Diagnosis: 1. 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 Piriformis syndrome of both sides   3. Lumbar facet arthropathy   4. Other idiopathic scoliosis, thoracolumbar region   5. Pain management contract signed   6. Chronic pain syndrome    Problems updated and reviewed during this visit: Problem  Other Idiopathic Scoliosis, Thoracolumbar Region  Arthritis of Sacroiliac Joint of Both Sides  Piriformis Syndrome of Both Sides    Plan of Care  Pharmacotherapy (Medications Ordered): Meds ordered this encounter  Medications   oxyCODONE-acetaminophen (PERCOCET) 10-325 MG tablet    Sig: Take 1 tablet by mouth every 8 (eight) hours as needed for pain.    Dispense:  90 tablet    Refill:  0   buprenorphine (BUTRANS) 20 MCG/HR PTWK    Sig: Place 1 patch onto the skin once a week for 28 days.    Dispense:  4 patch    Refill:  0    Chronic Pain: STOP Act (Not applicable) Fill 1 day early if closed on refill date. Avoid benzodiazepines within 8 hours of opioids   Procedure Orders         SACROILIAC JOINT INJECTION         TRIGGER POINT  INJECTION        Interventional options: SI joint injection, piriformis injection, repeat lumbar facet medial branch nerve blocks, suprascapular nerve block, suprascapular nerve stim, RFA  Provider-requested follow-up: Return in about 4 days (around 12/25/2021) for B/L SI-J and Piriformis PO Valium (ok to continue Eliquis). Recent Visits Date Type Provider Dept  12/07/21 Office Visit Gillis Santa, MD [facility]  Showing recent visits within past 90 days and meeting all other requirements Today's Visits Date Type Provider Dept  12/21/21 Office Visit Gillis Santa, MD [facility]  Showing today's visits and meeting all other requirements Future Appointments Date Type Provider Dept  01/30/22 Appointment Gillis Santa, MD [facility]  Showing future appointments within next 90 days and meeting all other requirements  Primary Care Physician: Lorrene Reid, PA-C Note by: Gillis Santa, MD Date: 12/21/2021; Time: 3:27 PM

## 2021-12-27 ENCOUNTER — Ambulatory Visit (INDEPENDENT_AMBULATORY_CARE_PROVIDER_SITE_OTHER): Payer: Medicare PPO | Admitting: Orthopaedic Surgery

## 2021-12-27 DIAGNOSIS — M542 Cervicalgia: Secondary | ICD-10-CM | POA: Diagnosis not present

## 2021-12-27 NOTE — Progress Notes (Signed)
Chief Complaint: Left shoulder pain, lower back pain     History of Present Illness:   12/27/2021: Presents today for follow-up of his multiple musculoskeletal injuries.  In the meantime he has been able to get established with pain management clinic.  He is scheduled for bilateral SI injections.  He has been seen for his back pain there.  We did discuss that his right upper trapezius pain is much better after trapezius trigger point injections.  Andrew Townsend is a 71 y.o. male right-hand-dominant male presents with ongoing left shoulder pain and limited range of motion for multiple years.  He previously had a right shoulder arthroplasty done in March 2022 by Dr. Alfonse Ras.  He is has had a history of bilateral rotator cuff repairs done many years prior he does not specifically remember when.  He believes that the left shoulder is nonpainful as he is compensating for the recovery on his right shoulder.  He has not been completely happy with the recovery and rehab on the right shoulder.  He is here today for further advice regarding this.  He has had multiple injections on the left shoulder the most recent 1 done in summer 2020 did not help at all.  This is achy and limited.  Regard to his lower back pain he has had multiple MRIs and ultimately recommended for lumbar fusion although he is unwilling to do this.  He is quite concerned about the possibility of undergoing lumbar surgery.    Surgical History:   None  PMH/PSH/Family History/Social History/Meds/Allergies:    Past Medical History:  Diagnosis Date   Arthritis    Blood transfusion without reported diagnosis    CAD (coronary artery disease)    Cataract    Chronic kidney disease    COPD (chronic obstructive pulmonary disease) (HCC)    GERD (gastroesophageal reflux disease)    Hyperlipidemia    Hypertension    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)    Myocardial infarction  (HCC)    Stroke [facility])    Past Surgical History:  Procedure Laterality Date   CORONARY ARTERY BYPASS GRAFT     HERNIA REPAIR     Social History   Socioeconomic History   Marital status: Widowed    Spouse name: Not on file   Number of children: Not on file   Years of education: Not on file   Highest education level: Not on file  Occupational History   Not on file  Tobacco Use   Smoking status: Every Day    Types: E-cigarettes   Smokeless tobacco: Never  Vaping Use   Vaping Use: Some days  Substance and Sexual Activity   Alcohol use: Not on file   Drug use: Never   Sexual activity: Not Currently  Other Topics Concern   Not on file  Social History Narrative   Not on file   Social Determinants of Health   Financial Resource Strain: Not on file  Food Insecurity: Not on file  Transportation Needs: Not on file  Physical Activity: Not on file  Stress: Not on file  Social Connections: Not on file   No family history on file. No Known Allergies Current Outpatient Medications  Medication Sig Dispense Refill   albuterol (PROVENTIL) (2.5 MG/3ML) 0.083% nebulizer solution INHALE THE CONTENTS OF 1 VIAL VIA  NEBULIZER EVERY 6 HOURS AS NEEDED FOR WHEEZING OR SHORTNESS OF BREATH. 150 mL 0   albuterol (VENTOLIN HFA) 108 (90 Base) MCG/ACT inhaler Inhale 2 puffs into the lungs every 6 (six) hours as needed for wheezing or shortness of breath. 8 g 2   allopurinol (ZYLOPRIM) 100 MG tablet Take 1 tablet (100 mg total) by mouth daily. 90 tablet 0   apixaban (ELIQUIS) 2.5 MG TABS tablet Take 1 tablet (2.5 mg total) by mouth 2 (two) times daily. 60 tablet 11   buprenorphine (BUTRANS) 15 MCG/HR SMARTSIG:Patch(s) Topical Once a Week     buprenorphine (BUTRANS) 20 MCG/HR PTWK Place 1 patch onto the skin once a week for 28 days. 4 patch 0   eszopiclone (LUNESTA) 2 MG TABS tablet TAKE 1 TABLET (2 MG TOTAL) BY MOUTH AT BEDTIME AS NEEDED FOR SLEEP. TAKE IMMEDIATELY BEFORE BEDTIME 30 tablet 0    ezetimibe (ZETIA) 10 MG tablet Take 1 tablet (10 mg total) by mouth daily. 90 tablet 1   Fluticasone-Umeclidin-Vilant (TRELEGY ELLIPTA) 100-62.5-25 MCG/ACT AEPB Inhale 1 puff into the lungs daily. 1 each 11   furosemide (LASIX) 40 MG tablet Take 1 tablet (40 mg total) by mouth daily as needed for fluid. 90 tablet 3   gabapentin (NEURONTIN) 300 MG capsule Take 2 capsules (600 mg total) by mouth at bedtime. 60 capsule 2   metoprolol succinate (TOPROL-XL) 100 MG 24 hr tablet Take 1.5 tablets (150 mg total) by mouth daily. 135 tablet 1   Multiple Vitamin (MULTIVITAMIN) tablet Take 1 tablet by mouth daily.     nitroGLYCERIN (NITROSTAT) 0.4 MG SL tablet Place 1 tablet (0.4 mg total) under the tongue every 5 (five) minutes as needed for chest pain. 30 tablet 1   omeprazole (PRILOSEC) 40 MG capsule Take 1 capsule (40 mg total) by mouth daily. 90 capsule 1   oxybutynin (DITROPAN-XL) 10 MG 24 hr tablet Take 1 tablet (10 mg total) by mouth daily. 30 tablet 11   oxyCODONE-acetaminophen (PERCOCET) 10-325 MG tablet Take 1 tablet by mouth every 8 (eight) hours as needed for pain. 90 tablet 0   Potassium Chloride ER 20 MEQ TBCR Take 1 tablet by mouth daily. 90 tablet 1   rosuvastatin (CRESTOR) 40 MG tablet Take 1 tablet (40 mg total) by mouth at bedtime. 90 tablet 0   tiZANidine (ZANAFLEX) 4 MG tablet Take 1 tablet (4 mg total) by mouth every 8 (eight) hours as needed. 90 tablet 0   No current facility-administered medications for this visit.   No results found.  Review of Systems:   A ROS was performed including pertinent positives and negatives as documented in the HPI.  Physical Exam :   Constitutional: NAD and appears stated age Neurological: Alert and oriented Psych: Appropriate affect and cooperative There were no vitals taken for this visit.   Comprehensive Musculoskeletal Exam:    Musculoskeletal Exam    Inspection Right Left  Skin No atrophy or winging No atrophy or winging  Palpation     Tenderness None Left glenohumeral  Range of Motion    Flexion (passive) 120 70  Flexion (active) 120 70  Abduction 100 50  ER at the side 45 10  Can reach behind back to L3 L3  Strength     Limited Limited  Special Tests    Pseudoparalytic No No  Neurologic    Fires PIN, radial, median, ulnar, musculocutaneous, axillary, suprascapular, long thoracic, and spinal accessory innervated muscles. No abnormal sensibility  Vascular/Lymphatic  Radial Pulse 2+ 2+  Cervical Exam    Patient has symmetric cervical range of motion with negative Spurling's test.  Special Test: Tenderness to palpation focally about the right trapezius      Imaging:   Xray (3 views left shoulder): Advanced glenohumeral osteoarthritis   I personally reviewed and interpreted the radiographs.   Assessment:   71 year old male with left advanced glenohumeral osteoarthritis which is failed conservative management.  He is attempting to rehab his previous right shoulder reverse shoulder arthroplasty done by an outside surgeon but this overall has had somewhat of a plateau as he is not able to work through his trapezial spasms on the right side.  He is overall much improved with trigger point injections.  He will plan to reach out to [place] in the future should he want any additional treatment for the left shoulder -Return to clinic as needed     I personally saw and evaluated the patient, and participated in the management and treatment plan.  Vanetta Mulders, MD Attending Physician, Orthopedic Surgery  This document was dictated using Dragon voice recognition software. A reasonable attempt at proof reading has been made to minimize errors.

## 2022-01-01 ENCOUNTER — Ambulatory Visit (HOSPITAL_BASED_OUTPATIENT_CLINIC_OR_DEPARTMENT_OTHER): Payer: Medicare PPO | Admitting: Cardiology

## 2022-01-03 ENCOUNTER — Ambulatory Visit
Payer: Medicare PPO | Attending: Student in an Organized Health Care Education/Training Program | Admitting: Student in an Organized Health Care Education/Training Program

## 2022-01-03 ENCOUNTER — Encounter: Payer: Self-pay | Admitting: Student in an Organized Health Care Education/Training Program

## 2022-01-03 ENCOUNTER — Ambulatory Visit
Admission: RE | Admit: 2022-01-03 | Discharge: 2022-01-03 | Disposition: A | Discharge status: Home / Self Care | Payer: Medicare PPO | Source: Ambulatory Visit | Attending: Student in an Organized Health Care Education/Training Program | Admitting: Student in an Organized Health Care Education/Training Program

## 2022-01-03 VITALS — BP 152/85 | HR 67 | Temp 98.0°F | Resp 13 | Ht 66.0 in | Wt 133.0 lb

## 2022-01-03 DIAGNOSIS — M47818 Spondylosis without myelopathy or radiculopathy, sacral and sacrococcygeal region: Secondary | ICD-10-CM | POA: Diagnosis present

## 2022-01-03 DIAGNOSIS — G5700 Lesion of sciatic nerve, unspecified lower limb: Secondary | ICD-10-CM | POA: Diagnosis not present

## 2022-01-03 DIAGNOSIS — G894 Chronic pain syndrome: Secondary | ICD-10-CM | POA: Insufficient documentation

## 2022-01-03 DIAGNOSIS — G5703 Lesion of sciatic nerve, bilateral lower limbs: Secondary | ICD-10-CM

## 2022-01-03 DIAGNOSIS — M533 Sacrococcygeal disorders, not elsewhere classified: Secondary | ICD-10-CM | POA: Insufficient documentation

## 2022-01-03 DIAGNOSIS — M549 Dorsalgia, unspecified: Secondary | ICD-10-CM | POA: Insufficient documentation

## 2022-01-03 MED ORDER — LIDOCAINE HCL 2 % IJ SOLN
20.0000 mL | Freq: Once | INTRAMUSCULAR | Status: AC
Start: 2022-01-03 — End: 2022-01-03
  Administered 2022-01-03: 400 mg
  Filled 2022-01-03: qty 20

## 2022-01-03 MED ORDER — DEXAMETHASONE SODIUM PHOSPHATE 10 MG/ML IJ SOLN
10.0000 mg | Freq: Once | INTRAMUSCULAR | Status: AC
Start: 2022-01-03 — End: 2022-01-03
  Administered 2022-01-03: 10 mg
  Filled 2022-01-03: qty 1

## 2022-01-03 MED ORDER — DIAZEPAM 5 MG PO TABS
ORAL_TABLET | ORAL | Status: AC
  Filled 2022-01-03: qty 1

## 2022-01-03 MED ORDER — METHYLPREDNISOLONE ACETATE 80 MG/ML IJ SUSP
80.0000 mg | Freq: Once | INTRAMUSCULAR | Status: AC
Start: 2022-01-03 — End: 2022-01-03
  Administered 2022-01-03: 80 mg via INTRA_ARTICULAR
  Filled 2022-01-03: qty 1

## 2022-01-03 MED ORDER — IOHEXOL 180 MG/ML  SOLN
10.0000 mL | Freq: Once | INTRAMUSCULAR | Status: AC
Start: 2022-01-03 — End: 2022-01-03
  Administered 2022-01-03: 10 mL via INTRA_ARTICULAR
  Filled 2022-01-03: qty 20

## 2022-01-03 MED ORDER — DIAZEPAM 5 MG PO TABS
5.0000 mg | ORAL_TABLET | ORAL | Status: AC
  Administered 2022-01-03: 5 mg via ORAL

## 2022-01-03 MED ORDER — ROPIVACAINE HCL 2 MG/ML IJ SOLN
9.0000 mL | Freq: Once | INTRAMUSCULAR | Status: AC
Start: 2022-01-03 — End: 2022-01-03
  Administered 2022-01-03: 9 mL via PERINEURAL
  Filled 2022-01-03: qty 20

## 2022-01-03 MED ORDER — ROPIVACAINE HCL 2 MG/ML IJ SOLN
9.0000 mL | Freq: Once | INTRAMUSCULAR | Status: AC
Start: 2022-01-03 — End: 2022-01-03
  Administered 2022-01-03: 9 mL via INTRA_ARTICULAR
  Filled 2022-01-03: qty 20

## 2022-01-03 NOTE — Progress Notes (Signed)
PROVIDER NOTE: Interpretation of information contained herein should be left to medically-trained personnel. Specific patient instructions are provided elsewhere under "Patient Instructions" section of medical record. This document was created in part using STT-dictation technology, any transcriptional errors that may result from this process are unintentional.  Patient: Andrew Townsend Type: Established DOB: [birth year] MRN: [id number] PCP: Mayer Masker, PA-C  Service: Procedure DOS: 01/03/2022 Setting: Ambulatory Location: Ambulatory outpatient facility Delivery: Face-to-face Provider: Edward Jolly, MD Specialty: Interventional Pain Management Specialty designation: 09 Location: Outpatient facility Ref. Prov.: Edward Jolly, MD    Primary Reason for Visit: Interventional Pain Management Treatment. CC: Back Pain (low)   Procedure:               Type: Sacroiliac Joint Steroid Injection #1   and bilateral piriformis injection Laterality: Bilateral     Level: PSIS (Posterior Inferior Iliac Spine)  Imaging: Fluoroscopic guidance Anesthesia: Local anesthesia (1-2% Lidocaine) Anxiolysis: Oral Valium 5 mg DOS: 01/03/2022  Performed by: Edward Jolly, MD  Purpose: Diagnostic/Therapeutic Indications: Sacroiliac joint pain in the lower back and hip area severe enough to impact quality of life or function. Rationale (medical necessity): procedure needed and proper for the diagnosis and/or treatment of Mr. Guinta medical symptoms and needs. 1. 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 Piriformis syndrome of both sides   3. Chronic pain syndrome    NAS-11 Pain score:   Pre-procedure: 7 /10   Post-procedure: 1 /10     Target: Interarticular sacroiliac joint. Location: Medial to the postero-medial edge of iliac spine. Region: Lumbosacral-sacrococcygeal. Approach: Inferior postero-medial percutaneous approach. Type of procedure: Percutaneous joint injection.  Position / Prep /  Materials:  Position: Prone  Prep solution: DuraPrep (Iodine Povacrylex [0.7% available iodine] and Isopropyl Alcohol, 74% w/w) Prep Area: Entire posterior lumbosacral area  Materials:  Tray: Block Needle(s):  Type: Spinal  Gauge (G): 25  Length: 3.5-in Qty: 2  Pre-op H&P Assessment:  Mr. Dawood is a 71 y.o. (year old), male patient, seen today for interventional treatment. He  has a past surgical history that includes Coronary artery bypass graft and Hernia repair. Mr. Meyerhoff has a current medication list which includes the following prescription(s): albuterol, albuterol, allopurinol, apixaban, buprenorphine, eszopiclone, ezetimibe, trelegy ellipta, furosemide, gabapentin, metoprolol succinate, multivitamin, nitroglycerin, omeprazole, oxybutynin, oxycodone-acetaminophen, potassium chloride er, rosuvastatin, tizanidine, and buprenorphine. His primarily concern today is the Back Pain (low)  Initial Vital Signs:  Pulse/HCG Rate:  (!) 56   Temp: 98 F (36.7 C) Resp: 16 BP: (!) 172/87 SpO2: 98 %  BMI: Estimated body mass index is 21.47 kg/m as calculated from the following:   Height as of this encounter: 5\' 6"  (1.676 m).   Weight as of this encounter: 133 lb (60.3 kg).  Risk Assessment: Allergies: Reviewed. He has No Known Allergies.  Allergy Precautions: None required Coagulopathies: Reviewed. None identified.  Blood-thinner therapy: None at this time Active Infection(s): Reviewed. None identified. Mr. Houdeshell is afebrile  Site Confirmation: Mr. Dhawan was asked to confirm the procedure and laterality before marking the site Procedure checklist: Completed Consent: Before the procedure and under the influence of no sedative(s), amnesic(s), or anxiolytics, the patient was informed of the treatment options, risks and possible complications. To fulfill our ethical and legal obligations, as recommended by the American Medical Association's Code of Ethics, I have informed the patient of my  clinical impression; the nature and purpose of the treatment or procedure; the risks, benefits, and possible complications of the intervention; the alternatives, including  doing nothing; the risk(s) and benefit(s) of the alternative treatment(s) or procedure(s); and the risk(s) and benefit(s) of doing nothing. The patient was provided information about the general risks and possible complications associated with the procedure. These may include, but are not limited to: failure to achieve desired goals, infection, bleeding, organ or nerve damage, allergic reactions, paralysis, and death. In addition, the patient was informed of those risks and complications associated to the procedure, such as failure to decrease pain; infection; bleeding; organ or nerve damage with subsequent damage to sensory, motor, and/or autonomic systems, resulting in permanent pain, numbness, and/or weakness of one or several areas of the body; allergic reactions; (i.e.: anaphylactic reaction); and/or death. Furthermore, the patient was informed of those risks and complications associated with the medications. These include, but are not limited to: allergic reactions (i.e.: anaphylactic or anaphylactoid reaction(s)); adrenal axis suppression; blood sugar elevation that in diabetics may result in ketoacidosis or comma; water retention that in patients with history of congestive heart failure may result in shortness of breath, pulmonary edema, and decompensation with resultant heart failure; weight gain; swelling or edema; medication-induced neural toxicity; particulate matter embolism and blood vessel occlusion with resultant organ, and/or nervous system infarction; and/or aseptic necrosis of one or more joints. Finally, the patient was informed that Medicine is not an exact science; therefore, there is also the possibility of unforeseen or unpredictable risks and/or possible complications that may result in a catastrophic outcome. The  patient indicated having understood very clearly. We have given the patient no guarantees and we have made no promises. Enough time was given to the patient to ask questions, all of which were answered to the patient's satisfaction. Mr. Vandevoorde has indicated that he wanted to continue with the procedure. Attestation: I, the ordering provider, attest that I have discussed with the patient the benefits, risks, side-effects, alternatives, likelihood of achieving goals, and potential problems during recovery for the procedure that I have provided informed consent. Date  Time: 01/03/2022 10:52 AM  Pre-Procedure Preparation:  Monitoring: As per clinic protocol. Respiration, ETCO2, SpO2, BP, heart rate and rhythm monitor placed and checked for adequate function Safety Precautions: Patient was assessed for positional comfort and pressure points before starting the procedure. Time-out: I initiated and conducted the "Time-out" before starting the procedure, as per protocol. The patient was asked to participate by confirming the accuracy of the "Time Out" information. Verification of the correct person, site, and procedure were performed and confirmed by me, the nursing staff, and the patient. "Time-out" conducted as per Joint Commission's Universal Protocol (UP.01.01.01). Time: 1215  Description/Narrative of Procedure:          Rationale (medical necessity): procedure needed and proper for the diagnosis and/or treatment of the patient's medical symptoms and needs. Procedural Technique Safety Precautions: Aspiration looking for blood return was conducted prior to all injections. At no point did we inject any substances, as a needle was being advanced. No attempts were made at seeking any paresthesias. Safe injection practices and needle disposal techniques used. Medications properly checked for expiration dates. SDV (single dose vial) medications used. Description of the Procedure: Protocol guidelines were followed.  The patient was assisted into a comfortable position. The target area was identified and the area prepped in the usual manner. Skin & deeper tissues infiltrated with local anesthetic. Appropriate amount of time allowed to pass for local anesthetics to take effect. The procedure needles were then advanced to the target area. Proper needle placement secured. Negative aspiration confirmed. Solution  injected in intermittent fashion, asking for systemic symptoms every 0.5cc of injectate. The needles were then removed and the area cleansed, making sure to leave some of the prepping solution back to take advantage of its long term bactericidal properties.  Technical description of procedure:  Fluoroscopy using a posterior anterior 45 degree angle from the midline aiming at the anterolateral aspect of the patient was used to find a direct path into the sacroiliac joint, the superior medial to posterior superior iliac spine.  The skin was marked where the desired target and the skin infiltrated with local anesthetics.  The procedure needle was then advanced until the joint was entered.  Once inside of the joint, we then proceeded to inject the desired solution.  10 cc solution made of 9 cc of 0.2% ropivacaine, 1 cc of methylprednisolone, 80 mg/cc.  5 cc injected into each SI joint after contrast confirmation under fluoroscopy.  Afterwards a right piriformis trigger point injection was done 1 cm inferior, 1 cm deep, 1 cm lateral to the inferior fissure of the SI joint.  Contrast was injected to confirm piriformis muscle striation.  10 cc solution made of 9 cc of 0.2% ropivacaine, 1 cc of Decadron 10 mg/cc.  5 cc injected into the right piriformis.While injecting, patient did not complain of any pain radiating down his right leg.  Afterwards a left piriformis trigger point injection was done 1 cm inferior, 1 cm deep, 1 cm lateral to the inferior fissure of the SI joint.  Contrast was injected to confirm piriformis  muscle striation.  10 cc solution made of 9 cc of 0.2% ropivacaine, 1 cc of Decadron 10 mg/cc.  5 cc injected into the left  piriformis.While injecting, patient did not complain of any pain radiating down his left leg.   Vitals:   01/03/22 1213 01/03/22 1218 01/03/22 1223 01/03/22 1227  BP: (!) 134/104 (!) 128/91 (!) 157/87 (!) 152/85  Pulse: 61 62 67 67  Resp: 12 13 12 13   Temp:      SpO2: 100% 100% 100% 100%  Weight:      Height:        Start Time: 1215 hrs. End Time: 1225 hrs.  Post-operative Assessment:  Post-procedure Vital Signs:  Pulse/HCG Rate: 67  Temp: 98 F (36.7 C) Resp: 13 BP:  (!) 152/85 SpO2: 100 %  EBL: None  Complications: No immediate post-treatment complications observed by team, or reported by patient.  Note: The patient tolerated the entire procedure well. A repeat set of vitals were taken after the procedure and the patient was kept under observation following institutional policy, for this type of procedure. Post-procedural neurological assessment was performed, showing return to baseline, prior to discharge. The patient was provided with post-procedure discharge instructions, including a section on how to identify potential problems. Should any problems arise concerning this procedure, the patient was given instructions to immediately contact us, at any time, without hesitation. In any case, we plan to contact the patient by telephone for a follow-up status report regarding this interventional procedure.  Comments:  No additional relevant information.  Plan of Care  Orders:  Orders Placed This Encounter  Procedures   DG PAIN [facility] C-ARM 1-60 MIN NO REPORT    Intraoperative interpretation by procedural physician at [place].    Standing Status:   Standing    Number of Occurrences:   1    Order Specific Question:   Reason for exam:    Answer:   Assistance in  needle guidance and placement for procedures requiring needle placement in or  near specific anatomical locations not easily accessible without such assistance.     Medications ordered for procedure: Meds ordered this encounter  Medications   iohexol (OMNIPAQUE) 180 MG/ML injection 10 mL    Must be Myelogram-compatible. If not available, you may substitute with a water-soluble, non-ionic, hypoallergenic, myelogram-compatible radiological contrast medium.   lidocaine (XYLOCAINE) 2 % (with pres) injection 400 mg   ropivacaine (PF) 2 mg/mL (0.2%) (NAROPIN) injection 9 mL   methylPREDNISolone acetate (DEPO-MEDROL) injection 80 mg   ropivacaine (PF) 2 mg/mL (0.2%) (NAROPIN) injection 9 mL   dexamethasone (DECADRON) injection 10 mg   Medications administered: We administered iohexol, lidocaine, ropivacaine (PF) 2 mg/mL (0.2%), methylPREDNISolone acetate, ropivacaine (PF) 2 mg/mL (0.2%), and dexamethasone.  See the medical record for exact dosing, route, and time of administration.  Follow-up plan:   Return for Keep sch. appt MM+ PPE, in person.       B/L SI joint and piriformis 01/03/2022  Recent Visits Date Type Provider Dept  12/21/21 Office Visit Gillis Santa, MD [facility]  12/07/21 Office Visit Gillis Santa, MD [facility]  Showing recent visits within past 90 days and meeting all other requirements Today's Visits Date Type Provider Dept  01/03/22 Procedure visit Gillis Santa, MD [facility]  Showing today's visits and meeting all other requirements Future Appointments Date Type Provider Dept  01/30/22 Appointment Gillis Santa, MD [facility]  Showing future appointments within next 90 days and meeting all other requirements  Disposition: Discharge home  Discharge (Date  Time): 01/03/2022; 1230 hrs.   Primary Care Physician: Lorrene Reid, PA-C Location: [facility] Note by: Gillis Santa, MD Date: 01/03/2022; Time: 2:00 PM  Disclaimer:  Medicine is not an exact science. The  only guarantee in medicine is that nothing is guaranteed. It is important to note that the decision to proceed with this intervention was based on the information collected from the patient. The Data and conclusions were drawn from the patient's questionnaire, the interview, and the physical examination. Because the information was provided in large part by the patient, it cannot be guaranteed that it has not been purposely or unconsciously manipulated. Every effort has been made to obtain as much relevant data as possible for this evaluation. It is important to note that the conclusions that lead to this procedure are derived in large part from the available data. Always take into account that the treatment will also be dependent on availability of resources and existing treatment guidelines, considered by other Pain Management Practitioners as being common knowledge and practice, at the time of the intervention. For Medico-Legal purposes, it is also important to point out that variation in procedural techniques and pharmacological choices are the acceptable norm. The indications, contraindications, technique, and results of the above procedure should only be interpreted and judged by a Board-Certified Interventional Pain Specialist with extensive familiarity and expertise in the same exact procedure and technique.

## 2022-01-03 NOTE — Patient Instructions (Signed)

## 2022-01-03 NOTE — Addendum Note (Signed)
Addended by: Leane Platt F on: 01/03/2022 02:32 PM   Modules accepted: Orders

## 2022-01-03 NOTE — Progress Notes (Signed)
Safety precautions to be maintained throughout the outpatient stay will include: orient to surroundings, keep bed in low position, maintain call bell within reach at all times, provide assistance with transfer out of bed and ambulation.  

## 2022-01-09 ENCOUNTER — Ambulatory Visit (HOSPITAL_BASED_OUTPATIENT_CLINIC_OR_DEPARTMENT_OTHER): Payer: Medicare PPO | Admitting: Cardiology

## 2022-01-09 ENCOUNTER — Encounter (HOSPITAL_BASED_OUTPATIENT_CLINIC_OR_DEPARTMENT_OTHER): Payer: Self-pay | Admitting: Cardiology

## 2022-01-09 VITALS — BP 168/84 | HR 56 | Ht 66.5 in | Wt 136.4 lb

## 2022-01-09 DIAGNOSIS — I255 Ischemic cardiomyopathy: Secondary | ICD-10-CM

## 2022-01-09 DIAGNOSIS — I480 Paroxysmal atrial fibrillation: Secondary | ICD-10-CM

## 2022-01-09 DIAGNOSIS — I10 Essential (primary) hypertension: Secondary | ICD-10-CM

## 2022-01-09 DIAGNOSIS — I5042 Chronic combined systolic (congestive) and diastolic (congestive) heart failure: Secondary | ICD-10-CM | POA: Diagnosis not present

## 2022-01-09 DIAGNOSIS — I420 Dilated cardiomyopathy: Secondary | ICD-10-CM

## 2022-01-09 DIAGNOSIS — I701 Atherosclerosis of renal artery: Secondary | ICD-10-CM | POA: Diagnosis not present

## 2022-01-09 DIAGNOSIS — I2510 Atherosclerotic heart disease of native coronary artery without angina pectoris: Secondary | ICD-10-CM | POA: Diagnosis not present

## 2022-01-09 DIAGNOSIS — N1831 Chronic kidney disease, stage 3a: Secondary | ICD-10-CM

## 2022-01-09 NOTE — Patient Instructions (Signed)

## 2022-01-09 NOTE — Progress Notes (Signed)
Cardiology Office Note:    Date:  01/09/2022   ID:  Berry Godsey, DOB [birth year], MRN [id number]  PCP:  Lorrene Reid, PA-C  Cardiologist:  Buford Dresser, MD  Referring MD: Lorrene Reid, PA-C   CC: follow up  History of Present Illness:    Andrew Townsend is a 71 y.o. male with a hx of hypertension, hyperlipidemia, myocardial infarction, COPD, CKD, neuropathy, BPH, and gout, who is seen for follow up today. I met him 06/19/21 as a new consult at the request of Lorrene Reid, PA-C for the evaluation and management of atherosclerosis. Previously followed by cardiology in [place], [place].  Cardiovascular risk factors: Prior clinical ASCVD: MI s/p angioplasty. ICD in place. Has never felt his device activate. History of renal arterial stent. Comorbid conditions: Hypertension, Hyperlipidemia,  Saw Dr. Lovena Le, device noted increased fluid, improved with increased dose of diuretic. Started on apixaban for afib detected on his device. He is not sure if he has ever felt afib.   At his last appointment he was struggling with chronic DOE and back pain, limiting his activity. His amlodipine was stopped, and he was instructed to monitor his at home blood pressures.  Today: In clinic today his blood pressure was 178/85. He confirms that this is high for him; at home his readings are closer to 118/70. He was rushing to his appointment today as well. At a previous clinic visit he attributed his high blood pressure due to anxiety.  He remains compliant with his medications. However, in the past couple days he has not had as much relief from his back pain. For exercise he will try to go on walks, but he is unable to go far due to developing severe pain.  Occasionally he notices mild LE edema. This has been managed well with his prn furosemide.  His creatinine was 1.69 as of 07/2021.  Additionally he complains of insomnia. Ambien was ineffective. Currently he is taking 2 mg Lunesta.   He  denies any palpitations, chest pain, shortness of breath. No lightheadedness, headaches, syncope, orthopnea, or PND.   Past Medical History:  Diagnosis Date   Arthritis    Blood transfusion without reported diagnosis    CAD (coronary artery disease)    Cataract    Chronic kidney disease    COPD (chronic obstructive pulmonary disease) (HCC)    GERD (gastroesophageal reflux disease)    Hyperlipidemia    Hypertension    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)    Myocardial infarction ([place])    Stroke [facility])     Past Surgical History:  Procedure Laterality Date   CORONARY ARTERY BYPASS GRAFT     HERNIA REPAIR      Current Medications: Current Outpatient Medications on File Prior to Visit  Medication Sig   albuterol (PROVENTIL) (2.5 MG/3ML) 0.083% nebulizer solution INHALE THE CONTENTS OF 1 VIAL VIA NEBULIZER EVERY 6 HOURS AS NEEDED FOR WHEEZING OR SHORTNESS OF BREATH.   albuterol (VENTOLIN HFA) 108 (90 Base) MCG/ACT inhaler Inhale 2 puffs into the lungs every 6 (six) hours as needed for wheezing or shortness of breath.   allopurinol (ZYLOPRIM) 100 MG tablet Take 1 tablet (100 mg total) by mouth daily.   apixaban (ELIQUIS) 2.5 MG TABS tablet Take 1 tablet (2.5 mg total) by mouth 2 (two) times daily.   buprenorphine (BUTRANS) 20 MCG/HR PTWK Place 1 patch onto the skin once a week for 28 days.   eszopiclone (LUNESTA) 2 MG TABS tablet TAKE 1 TABLET (2 MG  TOTAL) BY MOUTH AT BEDTIME AS NEEDED FOR SLEEP. TAKE IMMEDIATELY BEFORE BEDTIME   ezetimibe (ZETIA) 10 MG tablet Take 1 tablet (10 mg total) by mouth daily.   Fluticasone-Umeclidin-Vilant (TRELEGY ELLIPTA) 100-62.5-25 MCG/ACT AEPB Inhale 1 puff into the lungs daily.   furosemide (LASIX) 40 MG tablet Take 1 tablet (40 mg total) by mouth daily as needed for fluid.   gabapentin (NEURONTIN) 300 MG capsule Take 2 capsules (600 mg total) by mouth at bedtime.   metoprolol succinate (TOPROL-XL) 100 MG 24 hr  tablet Take 1.5 tablets (150 mg total) by mouth daily.   Multiple Vitamin (MULTIVITAMIN) tablet Take 1 tablet by mouth daily.   nitroGLYCERIN (NITROSTAT) 0.4 MG SL tablet Place 1 tablet (0.4 mg total) under the tongue every 5 (five) minutes as needed for chest pain.   omeprazole (PRILOSEC) 40 MG capsule Take 1 capsule (40 mg total) by mouth daily.   oxybutynin (DITROPAN-XL) 10 MG 24 hr tablet Take 1 tablet (10 mg total) by mouth daily.   oxyCODONE-acetaminophen (PERCOCET) 10-325 MG tablet Take 1 tablet by mouth every 8 (eight) hours as needed for pain.   Potassium Chloride ER 20 MEQ TBCR Take 1 tablet by mouth daily.   rosuvastatin (CRESTOR) 40 MG tablet Take 1 tablet (40 mg total) by mouth at bedtime.   tiZANidine (ZANAFLEX) 4 MG tablet Take 1 tablet (4 mg total) by mouth every 8 (eight) hours as needed.   No current facility-administered medications on file prior to visit.     Allergies:   Patient has no known allergies.   Social History   Tobacco Use   Smoking status: Every Day    Types: E-cigarettes   Smokeless tobacco: Never  Vaping Use   Vaping Use: Some days  Substance Use Topics   Drug use: Never    Family History: No significant family history of heart disease  ROS:   Please see the history of present illness.   (+) Back pain (+) Insomnia Additional pertinent ROS otherwise unremarkable.  EKGs/Labs/Other Studies Reviewed:    The following studies were reviewed today:  CT Chest 05/17/2021: FINDINGS: Cardiovascular: Heart is upper limits of normal in size. No pericardial effusion. Three-vessel coronary artery calcifications status post CABG. Atherosclerotic disease of the thoracic aorta. 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   Mediastinum/Nodes: No pathologically enlarged lymph nodes seen in the chest. Small hiatal hernia.   Lungs/Pleura: Central airways are patent. Bilateral bronchial wall thickening. Bilateral consolidations with  adjacent nodularity, most pronounced in the right upper lobe. No pleural effusion or pneumothorax.   Upper Abdomen: No acute abnormality.   Musculoskeletal: Prior right shoulder arthroplasty.   IMPRESSION: 1. Bilateral consolidations with adjacent nodularity, most pronounced in the posterior right upper lobe, findings are likely sequela of infection or aspiration. Follow-up chest CT is recommended in 2-3 months to ensure resolution. 2. Bilateral bronchial wall thickening, findings can be seen in the setting of bronchitis. 3. Aortic Atherosclerosis (ICD10-I70.0) and Emphysema (ICD10-J43.9).  Echo 03/27/2021 ([facility]): Left Ventricle:  The left ventricular chamber size is normal. There is no left  ventricular hypertrophy. There is mildly decreased left ventricular  systolic function. The estimated ejection fraction is 45-50%.  There is  abnormal ventricular septal wall motion consistent with right  ventricular pacemaker.  Abnormal left ventricular diastolic function is  observed.   Left Atrium:  The left atrial chamber size is normal.   Right Ventricle:  The right ventricular cavity size is  normal. The right ventricular  global systolic function is normal. A pacemaker wire is visualized in  the right ventricle.   Right Atrium:  The right atrial cavity size is normal.   Aortic Valve:  The aortic valve is trileaflet. There is no evidence of aortic stenosis.  There is no evidence of aortic regurgitation.   Mitral Valve:  The mitral valve leaflets appear normal. There is no evidence of mitral  stenosis. There is a trace of mitral regurgitation.   Tricuspid Valve:  There is a trace tricuspid regurgitation.     Pulmonic Valve:  The pulmonic valve appears normal in structure and function.   Pericardium:  The pericardium appears normal.   Aorta:  The aorta appears normal.     Pulmonary Artery:  The main pulmonary artery appears normal.    Conclusions:                Mildly depressed LV function.  Maybe inferior base hypokinesis but not  seen well.  No significant valve disease.   Cardiac Catheterization 08/05/2020 ([facility]): Findings:  --LM: Distal LM has a 95% stenosis  --LAD: The proximal LAD has diffuse moderate-severe disease. The mid LAD is 100% occluded. The LIMA-LAD is patent. The distal LAD has mild diffuse disease. The SVG-diagonal is known to be occluded and was not injected.  --LCx: The proximal LCx is 100% occluded. The SVG-OM is widely patent. The distal OM territory it serves has only minimal luminal disease.  --RCA: Not engaged. Known to be 100% occluded in the proximal segment. The SVG-PDA is patent but has TIMI II flow. There are R-L collaterals.   Intervention: none   EKG:  EKG is personally reviewed.   01/09/2022:  EKG was not ordered. 06/19/2021: NSR, IVCB at 69 bpm  Recent Labs: 05/23/2021: BNP 124.6; Hemoglobin 12.4; Platelets 291; TSH 4.020 07/20/2021: ALT 16; BUN 22; Creatinine, Ser 1.69; Potassium 4.4; Sodium 141   Recent Lipid Panel No results found for: "CHOL", "TRIG", "HDL", "CHOLHDL", "VLDL", "LDLCALC", "LDLDIRECT"  Physical Exam:    VS:  BP (!) 168/84 (BP Location: Right Arm, Patient Position: Sitting, Cuff Size: Normal)   Pulse (!) 56   Ht 5' 6.5" (1.689 m)   Wt 136 lb 6.4 oz (61.9 kg)   SpO2 98%   BMI 21.69 kg/m     Wt Readings from Last 3 Encounters:  01/09/22 136 lb 6.4 oz (61.9 kg)  01/03/22 133 lb (60.3 kg)  12/21/21 133 lb (60.3 kg)    GEN: Well nourished, well developed in no acute distress HEENT: Normal, moist mucous membranes NECK: No JVD CARDIAC: regular rhythm, normal S1 and S2, no rubs or gallops. No murmur. VASCULAR: Radial and DP pulses 2+ bilaterally. No carotid bruits RESPIRATORY:  Clear to auscultation without rales, wheezing or rhonchi  ABDOMEN: Soft, non-tender, non-distended MUSCULOSKELETAL:  Ambulates independently SKIN: Warm and  dry, no edema NEUROLOGIC:  Alert and oriented x 3. No focal neuro deficits noted. PSYCHIATRIC:  Normal affect    ASSESSMENT:    1. Coronary artery disease involving native coronary artery of native heart without angina pectoris   2. Ischemic dilated cardiomyopathy ([place])   3. Chronic combined systolic and diastolic congestive heart failure ([place])   4. Renal artery stenosis (HCC)   5. Stage 3a chronic kidney disease ([place])   6. Essential hypertension   7. Paroxysmal atrial fibrillation (HCC)    PLAN:    CAD with prior 4V CABG Ischemic cardiomyopathy Chronic systolic and diastolic  heart failure Shortness of breath, intermittent lower extremity edema -appears euvolemic today -has lasix 40 mg PRN -continue metoprolol succinate 150 mg daily -continue aspirin -on ezetimibe, rosuvastatin -Not currently seeing nephrologist -If BP remains elevated, would prefer ARB, but will need to follow kidney function closely. Alternative would be isosorbide/hydralazine  Stage 3a chronic kidney disease Renal artery stenosis, right, s/p right renal artery stent Hypertension -last Cr 1.69, prior 2.09 -previously borderline low BP and amlodipine stopped due to edema. Reports excellent control at home. Monitor home BP, if >130/80 consistently he will contact me  History of VT, EP study 06/28/2016 S/P ICD Paroxysmal atrial fibrillation -followed by Dr. Lovena Le -continue metoprolol succinate, taking 150 mg daily CHA2DS2/VAS Stroke Risk Points =3 -on apixaban, reduce dose based on body weight and Cr  Cardiac risk counseling and prevention recommendations: -recommend heart healthy/Mediterranean diet, with whole grains, fruits, vegetable, fish, lean meats, nuts, and olive oil. Limit salt. -recommend moderate walking, 3-5 times/week for 30-50 minutes each session. Aim for at least 150 minutes.week. Goal should be pace of 3 miles/hours, or walking 1.5 miles in 30 minutes -recommend avoidance of tobacco  products. Avoid excess alcohol.  Plan for follow up: 3 months or sooner as needed.  Buford Dresser, MD, PhD, [facility]    Medication Adjustments/Labs and Tests Ordered: Current medicines are reviewed at length with the patient today.  Concerns regarding medicines are outlined above.   No orders of the defined types were placed in this encounter.  No orders of the defined types were placed in this encounter.  Patient Instructions  Medication Instructions:  Your Physician recommend you continue on your current medication as directed.    *If you need a refill on your cardiac medications before your next appointment, please call your pharmacy*   Lab Work: None ordered today   Testing/Procedures: None ordered today   Follow-Up: At [facility], you and your health needs are our priority.  As part of our continuing mission to provide you with exceptional heart care, we have created designated Provider Care Teams.  These Care Teams include your primary Cardiologist (physician) and Advanced Practice Providers (APPs -  Physician Assistants and Nurse Practitioners) who all work together to provide you with the care you need, when you need it.  We recommend signing up for the patient portal called "MyChart".  Sign up information is provided on this After Visit Summary.  MyChart is used to connect with patients for Virtual Visits (Telemedicine).  Patients are able to view lab/test results, encounter notes, upcoming appointments, etc.  Non-urgent messages can be sent to your provider as well.   To learn more about what you can do with MyChart, go to [email or web address].    Your next appointment:   3 month(s)  The format for your next appointment:   In Person  Provider:   Buford Dresser, MD{          I,Mathew Stumpf,acting as a scribe for Buford Dresser, MD.,have documented all relevant documentation on the behalf of Buford Dresser, MD,as directed by  Buford Dresser, MD while in the presence of Buford Dresser, MD.  I, Buford Dresser, MD, have reviewed all documentation for this visit. The documentation on 02/11/22 for the exam, diagnosis, procedures, and orders are all accurate and complete.   Signed, Buford Dresser, MD PhD 01/09/2022     [place]

## 2022-01-09 NOTE — Progress Notes (Signed)
Remote ICD transmission.   

## 2022-01-19 ENCOUNTER — Other Ambulatory Visit: Payer: Self-pay | Admitting: Physician Assistant

## 2022-01-19 DIAGNOSIS — G4700 Insomnia, unspecified: Secondary | ICD-10-CM

## 2022-01-30 ENCOUNTER — Ambulatory Visit
Payer: Medicare PPO | Attending: Student in an Organized Health Care Education/Training Program | Admitting: Student in an Organized Health Care Education/Training Program

## 2022-01-30 ENCOUNTER — Encounter: Payer: Self-pay | Admitting: Student in an Organized Health Care Education/Training Program

## 2022-01-30 VITALS — BP 152/91 | HR 58 | Temp 97.4°F | Resp 16 | Ht 66.0 in | Wt 130.0 lb

## 2022-01-30 DIAGNOSIS — M5459 Other low back pain: Secondary | ICD-10-CM | POA: Insufficient documentation

## 2022-01-30 DIAGNOSIS — N1832 Chronic kidney disease, stage 3b: Secondary | ICD-10-CM | POA: Insufficient documentation

## 2022-01-30 DIAGNOSIS — M47816 Spondylosis without myelopathy or radiculopathy, lumbar region: Secondary | ICD-10-CM | POA: Insufficient documentation

## 2022-01-30 DIAGNOSIS — I25119 Atherosclerotic heart disease of native coronary artery with unspecified angina pectoris: Secondary | ICD-10-CM | POA: Insufficient documentation

## 2022-01-30 DIAGNOSIS — I255 Ischemic cardiomyopathy: Secondary | ICD-10-CM | POA: Diagnosis present

## 2022-01-30 DIAGNOSIS — N400 Enlarged prostate without lower urinary tract symptoms: Secondary | ICD-10-CM | POA: Insufficient documentation

## 2022-01-30 DIAGNOSIS — M4125 Other idiopathic scoliosis, thoracolumbar region: Secondary | ICD-10-CM | POA: Diagnosis present

## 2022-01-30 DIAGNOSIS — Z9889 Other specified postprocedural states: Secondary | ICD-10-CM | POA: Insufficient documentation

## 2022-01-30 DIAGNOSIS — M47818 Spondylosis without myelopathy or radiculopathy, sacral and sacrococcygeal region: Secondary | ICD-10-CM | POA: Insufficient documentation

## 2022-01-30 DIAGNOSIS — I639 Cerebral infarction, unspecified: Secondary | ICD-10-CM

## 2022-01-30 DIAGNOSIS — Z95810 Presence of automatic (implantable) cardiac defibrillator: Secondary | ICD-10-CM | POA: Insufficient documentation

## 2022-01-30 DIAGNOSIS — G5703 Lesion of sciatic nerve, bilateral lower limbs: Secondary | ICD-10-CM | POA: Diagnosis present

## 2022-01-30 DIAGNOSIS — G894 Chronic pain syndrome: Secondary | ICD-10-CM | POA: Insufficient documentation

## 2022-01-30 DIAGNOSIS — Z0289 Encounter for other administrative examinations: Secondary | ICD-10-CM | POA: Insufficient documentation

## 2022-01-30 DIAGNOSIS — J449 Chronic obstructive pulmonary disease, unspecified: Secondary | ICD-10-CM | POA: Insufficient documentation

## 2022-01-30 DIAGNOSIS — D631 Anemia in chronic kidney disease: Secondary | ICD-10-CM | POA: Insufficient documentation

## 2022-01-30 HISTORY — DX: Cerebral infarction, unspecified: I63.9

## 2022-01-30 MED ORDER — TIZANIDINE HCL 4 MG PO TABS: 4.0000 mg | ORAL_TABLET | Freq: Three times a day (TID) | ORAL | 5 refills | Status: DC | PRN

## 2022-01-30 MED ORDER — BUPRENORPHINE 20 MCG/HR TD PTWK: 1.0000 | MEDICATED_PATCH | TRANSDERMAL | 1 refills | Status: AC

## 2022-01-30 MED ORDER — OXYCODONE-ACETAMINOPHEN 10-325 MG PO TABS: 1.0000 | ORAL_TABLET | Freq: Three times a day (TID) | ORAL | 0 refills | Status: AC | PRN

## 2022-01-30 NOTE — Progress Notes (Signed)
PROVIDER NOTE: Information contained herein reflects review and annotations entered in association with encounter. Interpretation of such information and data should be left to medically-trained personnel. Information provided to patient can be located elsewhere in the medical record under "Patient Instructions". Document created using STT-dictation technology, any transcriptional errors that may result from process are unintentional.    Patient: Andrew Townsend  Service Category: E/M  Provider: Gillis Santa, MD  DOB: 1950/09/06  DOS: 01/30/2022  Referring Provider: Ellouise Newer  MRN: [id number]  Specialty: Interventional Pain Management  PCP: Lorrene Reid, PA-C  Type: Established Patient  Setting: Ambulatory outpatient    Location: Office  Delivery: Face-to-face     HPI  Andrew Townsend, a 71 y.o. 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 Andrew Townsend primary complain today is Back Pain (Low right) Last encounter: My last encounter with him was on 01/03/2022. 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 Pain Assessment: Severity of Chronic pain is reported as a 6 /10. Location: Back Lower, Right/radiates into both legs greater on right. Onset: More than a month ago. Quality: Aching, Sharp, Shooting. Timing: Constant. Modifying factor(s): meds. Vitals:  height is _0  (1.676 m) and weight is 130 lb (59 kg). His temperature is 97.4 F (36.3 C) (abnormal). His blood pressure  is 152/91 (abnormal) and his pulse is 58 (abnormal). His respiration is 16 and oxygen saturation is 100%.   Reason for encounter: both, medication management and post-procedure evaluation and assessment.     HPI from initial clinic note: Andrew Townsend is a pleasant 71 year old male who has moved from [facility] hoping to establish with pain management.  Patient has a history of bilateral shoulder pain status post left rotator cuff surgery x1, right rotator cuff surgery x2, low back pain related to lumbar spinal stenosis and lumbar degenerative disc disease and lumbar facet arthropathy.  He has been on chronic opioid therapy which includes Butrans patch at 15 mcg an hour along with Percocet 10 mg 3 times daily as needed.  He also takes gabapentin 300 mg nightly which she is hoping to increase the dose of.  He has difficulty ambulating.  He states that he has to sit down and rest given weakness and pain in bilateral legs.  He also has a history of coronary artery disease status post CABG x4.  He has a ICD in place along with COPD, stage IV chronic kidney disease for which she needs to establish with nephrologist.  He has a right renal artery stent in place.  He is complaining of bilateral hip pain, SI joint pain as well as bilateral knee pain.  He is on Eliquis for his cardiac disease.  Of note, he has had many injections done in the past at his previous pain clinic including diagnostic lumbar facet medial branch nerve blocks, subsequent lumbar radiofrequency ablations with limited response.  He is also had multiple epidurals with limited response as well.  He is open to interventional options for pain management.   Post-procedure evaluation   Type: Sacroiliac Joint Steroid Injection #1   and bilateral piriformis  injection Laterality: Bilateral     Level: PSIS (Posterior Inferior Iliac Spine)  Imaging: Fluoroscopic guidance Anesthesia: Local anesthesia (1-2% Lidocaine) Anxiolysis: Oral Valium 5  mg DOS: 01/03/2022  Performed by: Gillis Santa, MD  Purpose: Diagnostic/Therapeutic Indications: Sacroiliac joint pain in the lower back and hip area severe enough to impact quality of life or function. Rationale (medical necessity): procedure needed and proper for the diagnosis and/or treatment of Andrew Townsend medical symptoms and needs. 1. 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 Piriformis syndrome of both sides   3. Chronic pain syndrome    NAS-11 Pain score:   Pre-procedure: 7 /10   Post-procedure: 1 /10      Effectiveness:  Initial hour after procedure: 75 %  Subsequent 4-6 hours post-procedure: 75 %  Analgesia past initial 6 hours: 75 % (lasting a couple of weeks.)  Ongoing improvement:  Analgesic:  <25% Function: Andrew Townsend reports improvement in function ROM: Somewhat improved [place] states that the first 2 weeks after his SI joint and piriformis injection, he had significantly less pain and was able to ambulate more comfortably.  He states that thereafter he started to notice a return of his buttock and SI joint pain.  We discussed repeating injection and adding additional volume to see if that results in longer duration of pain relief.  He is going to the beach in 2 weeks and would like to walk and engage in activities there.  We will plan on repeating next Wednesday.  Pharmacotherapy Assessment  Analgesic: Butrans patch, 20 mcg an hour, Percocet 10 mg 3 times daily as needed  Monitoring: [place] PMP: PDMP reviewed during this encounter.       Pharmacotherapy: No side-effects or adverse reactions reported. Compliance: No problems identified. Effectiveness: Clinically acceptable.  Andrew Shorter, RN  01/30/2022  8:44 AM  Sign when Signing Visit Nursing Pain Medication Assessment:  Safety precautions to be maintained throughout the outpatient stay will include: orient to surroundings, keep bed in low position, maintain call bell within reach at all times, provide assistance  with transfer out of bed and ambulation.  Medication Inspection Compliance: Pill count conducted under aseptic conditions, in front of the patient. Neither the pills nor the bottle was removed from the patient's sight at any time. Once count was completed pills were immediately returned to the patient in their original bottle.  Medication: Oxycodone/APAP Pill/Patch Count:  8 of 81 pills remain Pill/Patch Appearance: Markings consistent with prescribed medication Bottle Appearance: Standard pharmacy container. Clearly labeled. Filled Date: 08 / 16 / 2023 Last Medication intake:  Today    No results found for: "CBDTHCR" No results found for: "D8THCCBX" No results found for: "D9THCCBX"  UDS:  Summary  Date Value Ref Range Status  12/07/2021 Note  Final    Comment:    ==================================================================== Compliance Drug Analysis, Ur ==================================================================== Test                             Result       Flag       Units  Drug Present and Declared for Prescription Verification   Oxycodone                      6752         EXPECTED   ng/mg creat   Oxymorphone  1668         EXPECTED   ng/mg creat   Noroxycodone                   6775         EXPECTED   ng/mg creat   Noroxymorphone                 719          EXPECTED   ng/mg creat    Sources of oxycodone are scheduled prescription medications.    Oxymorphone, noroxycodone, and noroxymorphone are expected    metabolites of oxycodone. Oxymorphone is also available as a    scheduled prescription medication.    Buprenorphine                  7            EXPECTED   ng/mg creat   Norbuprenorphine               9            EXPECTED   ng/mg creat    Source of buprenorphine is a scheduled prescription medication.    Norbuprenorphine is an expected metabolite of buprenorphine.    Gabapentin                     PRESENT      EXPECTED   Tizanidine                      PRESENT      EXPECTED   Zopiclone/Eszopiclone          PRESENT      EXPECTED    Eszopiclone is detected as zopiclone (racemic).    Acetaminophen                  PRESENT      EXPECTED   Metoprolol                     PRESENT      EXPECTED  Drug Present not Declared for Prescription Verification   Ibuprofen                      PRESENT      UNEXPECTED ==================================================================== Test                      Result    Flag   Units      Ref Range   Creatinine              75               mg/dL      >=20 ==================================================================== Declared Medications:  The flagging and interpretation on this report are based on the  following declared medications.  Unexpected results may arise from  inaccuracies in the declared medications.   **Note: The testing scope of this panel includes these medications:   Gabapentin (Neurontin)  Metoprolol (Toprol)  Oxycodone (Percocet)   **Note: The testing scope of this panel does not include small to  moderate amounts of these reported medications:   Acetaminophen (Percocet)  Buprenorphine Patch (BuTrans)  Eszopiclone (Lunesta)  Tizanidine (Zanaflex)   **Note: The testing scope of this panel does not include the  following reported medications:   Albuterol (Ventolin HFA)  Allopurinol (Zyloprim)  Apixaban (Eliquis)  Ezetimibe (Zetia)  Fluticasone (Trelegy)  Furosemide (  Lasix)  Nitroglycerin (Nitrostat)  Omeprazole (Prilosec)  Oxybutynin (Ditropan)  Potassium Chloride  Rosuvastatin (Crestor)  Umeclidinium (Trelegy)  Vilanterol (Trelegy) ==================================================================== For clinical consultation, please call [phone]. ====================================================================       ROS  Constitutional: Denies any fever or chills Gastrointestinal: No reported hemesis, hematochezia, vomiting, or  acute GI distress Musculoskeletal:  Low back, bilateral SI joint and piriformis pain Neurological: No reported episodes of acute onset apraxia, aphasia, dysarthria, agnosia, amnesia, paralysis, loss of coordination, or loss of consciousness  Medication Review  Fluticasone-Umeclidin-Vilant, Potassium Chloride ER, albuterol, allopurinol, apixaban, buprenorphine, eszopiclone, ezetimibe, furosemide, gabapentin, metoprolol succinate, multivitamin, nitroGLYCERIN, omeprazole, oxyCODONE-acetaminophen, oxybutynin, rosuvastatin, and tiZANidine  History Review  Allergy: Andrew Townsend has No Known Allergies. Drug: Andrew Townsend  reports no history of drug use. Alcohol:  has no history on file for alcohol use. Tobacco:  reports that he has been smoking e-cigarettes. He has never used smokeless tobacco. Social: Andrew Townsend  reports that he has been smoking e-cigarettes. He has never used smokeless tobacco. He reports that he does not use drugs. Medical:  has a past medical history of Arthritis, Blood transfusion without reported diagnosis, CAD (coronary artery disease), Cataract, Chronic kidney disease, COPD (chronic obstructive pulmonary disease) ([place]), GERD (gastroesophageal reflux disease), Hyperlipidemia, Hypertension, 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), Myocardial infarction ([place]), and Stroke ([place]). Surgical: Mr. Dapolito  has a past surgical history that includes Coronary artery bypass graft and Hernia repair. Family: family history is not on file.  Laboratory Chemistry Profile   Renal Lab Results  Component Value Date   BUN 22 07/20/2021   CREATININE 1.69 (H) 07/20/2021   BCR 13 07/20/2021    Hepatic Lab Results  Component Value Date   AST 30 07/20/2021   ALT 16 07/20/2021   ALBUMIN 4.4 07/20/2021   ALKPHOS 92 07/20/2021    Electrolytes Lab Results  Component Value Date   NA 141 07/20/2021   K 4.4 07/20/2021   CL 104 07/20/2021   CALCIUM 10.4 (H) 07/20/2021     Bone No results found for: "VD25OH", "VD125OH2TOT", "EL3810FB5", "ZW2585ID7", "25OHVITD1", "25OHVITD2", "25OHVITD3", "TESTOFREE", "TESTOSTERONE"  Inflammation (CRP: Acute Phase) (ESR: Chronic Phase) No results found for: "CRP", "ESRSEDRATE", "LATICACIDVEN"       Note: Above Lab results reviewed.  Sacroiliac Joint Imaging: Sacroiliac Joint DG: Results for orders placed during the hospital encounter of 12/07/21   DG Si Joints   Narrative CLINICAL DATA:  Sacroiliac joint pain. Chronic lower back pain radiating to the bilateral legs.   EXAM: BILATERAL SACROILIAC JOINTS - 3+ VIEW   COMPARISON:  None Available.   FINDINGS: There is diffuse decreased bone mineralization. Mild mid to superior bilateral sacroiliac subchondral sclerosis and joint space narrowing. No subchondral erosions.   Mild bilateral femoroacetabular joint space narrowing. Moderate to severe left L4-5 and L5-S1 disc space narrowing. Hernia mesh coils overlie the pelvis. No acute fracture or dislocation.   IMPRESSION: Mild bilateral sacroiliac osteoarthritis.     Electronically Signed By: Yvonne Kendall M.D. On: 12/08/2021 11:12         Narrative CLINICAL DATA:  Chronic bilateral hip pain.   EXAM: DG HIP (WITH OR WITHOUT PELVIS) 2-3V RIGHT; DG HIP (WITH OR WITHOUT PELVIS) 2-3V LEFT   COMPARISON:  None Available.   FINDINGS: Mild bilateral sacroiliac joint space narrowing and subchondral sclerosis. Mild bilateral superolateral acetabular degenerative osteophytes. Mild bilateral femoroacetabular joint space narrowing. The pubic symphysis joint space is maintained. Diffuse decreased bone mineralization. No acute fracture is seen. No dislocation.  Mild-to-moderate vascular calcifications. Hernia mesh coils overlie the pelvis.   IMPRESSION: Mild bilateral femoroacetabular osteoarthritis.     Electronically Signed By: Yvonne Kendall M.D. On: 12/08/2021 11:24   Narrative CLINICAL DATA:  Chronic  bilateral hip pain.   EXAM: DG HIP (WITH OR WITHOUT PELVIS) 2-3V RIGHT; DG HIP (WITH OR WITHOUT PELVIS) 2-3V LEFT   COMPARISON:  None Available.   FINDINGS: Mild bilateral sacroiliac joint space narrowing and subchondral sclerosis. Mild bilateral superolateral acetabular degenerative osteophytes. Mild bilateral femoroacetabular joint space narrowing. The pubic symphysis joint space is maintained. Diffuse decreased bone mineralization. No acute fracture is seen. No dislocation. Mild-to-moderate vascular calcifications. Hernia mesh coils overlie the pelvis.   IMPRESSION: Mild bilateral femoroacetabular osteoarthritis.     Electronically Signed By: Yvonne Kendall M.D. On: 12/08/2021 11:24    Physical Exam  General appearance: Well nourished, well developed, and well hydrated. In no apparent acute distress Mental status: Alert, oriented x 3 (person, place, & time)       Respiratory: No evidence of acute respiratory distress Eyes: PERLA Vitals: BP (!) 152/91   Pulse (!) 58   Temp (!) 97.4 F (36.3 C)   Resp 16   Ht _0  (1.676 m)   Wt 130 lb (59 kg)   SpO2 100%   BMI 20.98 kg/m  BMI: Estimated body mass index is 20.98 kg/m as calculated from the following:   Height as of this encounter: _1  (1.676 m).   Weight as of this encounter: 130 lb (59 kg). Ideal: Ideal body weight: 63.8 kg (140 lb 10.5 oz)  Lumbar Spine Area Exam  Skin & Axial Inspection: No masses, redness, or swelling Alignment: Symmetrical Functional ROM: Pain restricted ROM       Stability: No instability detected Muscle Tone/Strength: Functionally intact. No obvious neuro-muscular anomalies detected. Sensory (Neurological): Dermatomal pain pattern and musculoskeletal Palpation: No palpable anomalies       Provocative Tests: Hyperextension/rotation test: deferred today       Lumbar quadrant test (Kemp's test): deferred today       Lateral bending test: deferred today       Patrick's Maneuver: (+)  for bilateral S-I arthralgia             FABER* test: +) for bilateral S-I arthralgia             S-I anterior distraction/compression test: +) for bilateral S-I arthralgia             S-I lateral compression test: +) for bilateral S-I arthralgia             S-I Thigh-thrust test: +) for bilateral S-I arthralgia             S-I Gaenslen's test: +) for bilateral S-I arthralgia             *(Flexion, ABduction and External Rotation)   Gait & Posture Assessment  Ambulation: Unassisted Gait: Relatively normal for age and body habitus Posture: WNL  Lower Extremity Exam      Side: Right lower extremity   Side: Left lower extremity  Stability: No instability observed           Stability: No instability observed          Skin & Extremity Inspection: Skin color, temperature, and hair growth are WNL. No peripheral edema or cyanosis. No masses, redness, swelling, asymmetry, or associated skin lesions. No contractures.   Skin & Extremity Inspection: Skin color, temperature, and hair growth are  WNL. No peripheral edema or cyanosis. No masses, redness, swelling, asymmetry, or associated skin lesions. No contractures.  Functional ROM: Pain restricted ROM for hip and knee joints           Functional ROM: Pain restricted ROM for hip and knee joints          Muscle Tone/Strength: Functionally intact. No obvious neuro-muscular anomalies detected.   Muscle Tone/Strength: Functionally intact. No obvious neuro-muscular anomalies detected.  Sensory (Neurological): Musculoskeletal pain pattern         Sensory (Neurological): Musculoskeletal pain pattern        DTR: Patellar: deferred today Achilles: deferred today Plantar: deferred today   DTR: Patellar: deferred today Achilles: deferred today Plantar: deferred today  Palpation: No palpable anomalies   Palpation: No palpable anomalies     Assessment   Diagnosis Status  1. 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 Piriformis syndrome of both sides    3. Lumbar facet arthropathy   4. Other idiopathic scoliosis, thoracolumbar region   5. Pain management contract signed   6. Hx of shoulder surgery (left rotator cuff surgery, right rotator cuff then reverse total shoulder arthoplasty)   7. 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)   8. Chronic pain syndrome    Responding Responding Controlled   Plan of Care   Andrew Townsend has a current medication list which includes the following long-term medication(s): albuterol, albuterol, allopurinol, apixaban, eszopiclone, ezetimibe, furosemide, gabapentin, metoprolol succinate, nitroglycerin, omeprazole, potassium chloride er, and rosuvastatin.  Pharmacotherapy (Medications Ordered): Meds ordered this encounter  Medications   oxyCODONE-acetaminophen (PERCOCET) 10-325 MG tablet    Sig: Take 1 tablet by mouth every 8 (eight) hours as needed for pain.    Dispense:  90 tablet    Refill:  0   oxyCODONE-acetaminophen (PERCOCET) 10-325 MG tablet    Sig: Take 1 tablet by mouth every 8 (eight) hours as needed for pain.    Dispense:  90 tablet    Refill:  0   buprenorphine (BUTRANS) 20 MCG/HR PTWK    Sig: Place 1 patch onto the skin once a week.    Dispense:  4 patch    Refill:  1   tiZANidine (ZANAFLEX) 4 MG tablet    Sig: Take 1 tablet (4 mg total) by mouth every 8 (eight) hours as needed.    Dispense:  90 tablet    Refill:  5   Orders:  Orders Placed This Encounter  Procedures   SACROILIAC JOINT INJECTION    Standing Status:   Future    Standing Expiration Date:   05/01/2022    Scheduling Instructions:     Bilateral SI-J PO Valium    Order Specific Question:   Where will this procedure be performed?    Answer:   [facility]   TRIGGER POINT INJECTION    Area: Buttocks region (gluteal area) Indications: Piriformis muscle pain; Bilateral (G57.03) piriformis-syndrome; piriformis muscle spasms (X50.569). CPT code: 20552    Scheduling Instructions:      Type: Myoneural block (TPI) of piriformis muscle.     Side:  B/L     Sedation: Patient's choice.     Timeframe: Today    Order Specific Question:   Where will this procedure be performed?    Answer:   [facility]   Follow-up plan:   Return in about 8 days (around 02/07/2022) for B/L SI-J and Piriformis , in clinic (PO Valium).     B/L  SI joint and piriformis 01/03/2022   Recent Visits Date Type Provider Dept  01/03/22 Procedure visit Gillis Santa, MD [facility]  12/21/21 Office Visit Gillis Santa, MD [facility]  12/07/21 Office Visit Gillis Santa, MD [facility]  Showing recent visits within past 90 days and meeting all other requirements Today's Visits Date Type Provider Dept  01/30/22 Office Visit Gillis Santa, MD [facility]  Showing today's visits and meeting all other requirements Future Appointments Date Type Provider Dept  02/07/22 Appointment Gillis Santa, MD [facility]  Showing future appointments within next 90 days and meeting all other requirements  I discussed the assessment and treatment plan with the patient. The patient was provided an opportunity to ask questions and all were answered. The patient agreed with the plan and demonstrated an understanding of the instructions.  Patient advised to call back or seek an in-person evaluation if the symptoms or condition worsens.  Duration of encounter: 8mnutes.  Total time on encounter, as per AMA guidelines included both the face-to-face and non-face-to-face time personally spent by the physician and/or other qualified health care professional(s) on the day of the encounter (includes time in activities that require the physician or other qualified health care professional and does not include time in activities normally performed by clinical staff). Physician's time may include the following activities when performed: preparing to see the patient (eg,  review of tests, pre-charting review of records) obtaining and/or reviewing separately obtained history performing a medically appropriate examination and/or evaluation counseling and educating the patient/family/caregiver ordering medications, tests, or procedures referring and communicating with other health care professionals (when not separately reported) documenting clinical information in the electronic or other health record independently interpreting results (not separately reported) and communicating results to the patient/ family/caregiver care coordination (not separately reported)  Note by: BGillis Santa MD Date: 01/30/2022; Time: 10:05 AM

## 2022-01-30 NOTE — Patient Instructions (Signed)
______________________________________________________________________  Preparing for your procedure (without sedation)  Procedure appointments are limited to planned procedures: No Prescription Refills. No disability issues will be discussed. No medication changes will be discussed.  Instructions: Food Intake: Avoid eating anything for at least 4 hours prior to your procedure. Transportation: Unless otherwise stated by your physician, bring a driver. Morning Medicines: Take all of your scheduled morning medications. If you take heart medicine, except for blood thinners, do not forget to take it the morning of the procedure. If your Diastolic (lower reading) is above 100 mmHg, elective cases will be cancelled/rescheduled. Blood thinners: These will need to be stopped for procedures. Notify our staff if you are taking any blood thinners. Depending on which one you take, there will be specific instructions on how and when to stop it. Diabetics on insulin: Notify the staff so that you can be scheduled 1st case in the morning. If your diabetes requires high dose insulin, take only  of your normal insulin dose the morning of the procedure and notify the staff that you have done so. Preventing infections: Shower with an antibacterial soap the morning of your procedure.  Build-up your immune system: Take 1000 mg of Vitamin C with every meal (3 times a day) the day prior to your procedure. Antibiotics: Inform the staff if you have a condition or reason that requires you to take antibiotics before dental procedures. Pregnancy: If you are pregnant, call and cancel the procedure. Sickness: If you have a cold, fever, or any active infections, call and cancel the procedure. Arrival: You must be in the facility at least 30 minutes prior to your scheduled procedure. Children: Do not bring any children with you. Dress appropriately: There is always a possibility that your clothing may get soiled. Valuables:  Do not bring any jewelry or valuables.  Reasons to call and reschedule or cancel your procedure: (Following these recommendations will minimize the risk of a serious complication.) Surgeries: Avoid having procedures within 2 weeks of any surgery. (Avoid for 2 weeks before or after any surgery). Flu Shots: Avoid having procedures within 2 weeks of a flu shots or . (Avoid for 2 weeks before or after immunizations). Barium: Avoid having a procedure within 7-10 days after having had a radiological study involving the use of radiological contrast. (Myelograms, Barium swallow or enema study). Heart attacks: Avoid any elective procedures or surgeries for the initial 6 months after a "Myocardial Infarction" (Heart Attack). Blood thinners: It is imperative that you stop these medications before procedures. Let us know if you if you take any blood thinner.  Infection: Avoid procedures during or within two weeks of an infection (including chest colds or gastrointestinal problems). Symptoms associated with infections include: Localized redness, fever, chills, night sweats or profuse sweating, burning sensation when voiding, cough, congestion, stuffiness, runny nose, sore throat, diarrhea, nausea, vomiting, cold or Flu symptoms, recent or current infections. It is specially important if the infection is over the area that we intend to treat. Heart and lung problems: Symptoms that may suggest an active cardiopulmonary problem include: cough, chest pain, breathing difficulties or shortness of breath, dizziness, ankle swelling, uncontrolled high or unusually low blood pressure, and/or palpitations. If you are experiencing any of these symptoms, cancel your procedure and contact your primary care physician for an evaluation.  Remember:  Regular Business hours are:  Monday to Thursday 8:00 AM to 4:00 PM  Provider's Schedule: Francisco Naveira, MD:  Procedure days: Tuesday and Thursday 7:30 AM to 4:00 PM    Bilal  Lateef, MD:  Procedure days: Monday and Wednesday 7:30 AM to 4:00 PM ______________________________________________________________________   

## 2022-01-30 NOTE — Progress Notes (Signed)
Nursing Pain Medication Assessment:  Safety precautions to be maintained throughout the outpatient stay will include: orient to surroundings, keep bed in low position, maintain call bell within reach at all times, provide assistance with transfer out of bed and ambulation.  Medication Inspection Compliance: Pill count conducted under aseptic conditions, in front of the patient. Neither the pills nor the bottle was removed from the patient's sight at any time. Once count was completed pills were immediately returned to the patient in their original bottle.  Medication: Oxycodone/APAP Pill/Patch Count:  8 of 81 pills remain Pill/Patch Appearance: Markings consistent with prescribed medication Bottle Appearance: Standard pharmacy container. Clearly labeled. Filled Date: 08 / 16 / 2023 Last Medication intake:  Today

## 2022-02-07 ENCOUNTER — Encounter: Payer: Self-pay | Admitting: Physician Assistant

## 2022-02-07 ENCOUNTER — Ambulatory Visit
Admission: RE | Admit: 2022-02-07 | Discharge: 2022-02-07 | Disposition: A | Discharge status: Home / Self Care | Payer: Medicare PPO | Source: Ambulatory Visit | Attending: Student in an Organized Health Care Education/Training Program | Admitting: Student in an Organized Health Care Education/Training Program

## 2022-02-07 ENCOUNTER — Encounter: Payer: Self-pay | Admitting: Student in an Organized Health Care Education/Training Program

## 2022-02-07 ENCOUNTER — Ambulatory Visit (INDEPENDENT_AMBULATORY_CARE_PROVIDER_SITE_OTHER): Payer: Medicare PPO | Admitting: Physician Assistant

## 2022-02-07 ENCOUNTER — Ambulatory Visit
Payer: Medicare PPO | Attending: Student in an Organized Health Care Education/Training Program | Admitting: Student in an Organized Health Care Education/Training Program

## 2022-02-07 VITALS — BP 128/75 | HR 54 | Temp 98.0°F | Wt 126.0 lb

## 2022-02-07 VITALS — BP 120/58 | HR 57 | Temp 97.2°F | Resp 16 | Ht 66.0 in | Wt 133.0 lb

## 2022-02-07 DIAGNOSIS — K219 Gastro-esophageal reflux disease without esophagitis: Secondary | ICD-10-CM

## 2022-02-07 DIAGNOSIS — M533 Sacrococcygeal disorders, not elsewhere classified: Secondary | ICD-10-CM | POA: Diagnosis not present

## 2022-02-07 DIAGNOSIS — M1990 Unspecified osteoarthritis, unspecified site: Secondary | ICD-10-CM | POA: Diagnosis not present

## 2022-02-07 DIAGNOSIS — G4700 Insomnia, unspecified: Secondary | ICD-10-CM | POA: Diagnosis not present

## 2022-02-07 DIAGNOSIS — M545 Low back pain, unspecified: Secondary | ICD-10-CM | POA: Insufficient documentation

## 2022-02-07 DIAGNOSIS — J449 Chronic obstructive pulmonary disease, unspecified: Secondary | ICD-10-CM

## 2022-02-07 DIAGNOSIS — M47818 Spondylosis without myelopathy or radiculopathy, sacral and sacrococcygeal region: Secondary | ICD-10-CM | POA: Diagnosis not present

## 2022-02-07 DIAGNOSIS — G5703 Lesion of sciatic nerve, bilateral lower limbs: Secondary | ICD-10-CM

## 2022-02-07 DIAGNOSIS — G5700 Lesion of sciatic nerve, unspecified lower limb: Secondary | ICD-10-CM | POA: Insufficient documentation

## 2022-02-07 MED ORDER — ZOLPIDEM TARTRATE 5 MG PO TABS: 5.0000 mg | ORAL_TABLET | Freq: Every evening | ORAL | 1 refills | Status: DC | PRN

## 2022-02-07 MED ORDER — METHYLPREDNISOLONE ACETATE 80 MG/ML IJ SUSP
80.0000 mg | Freq: Once | INTRAMUSCULAR | Status: AC
  Administered 2022-02-07: 80 mg via INTRA_ARTICULAR

## 2022-02-07 MED ORDER — DIAZEPAM 5 MG PO TABS
ORAL_TABLET | ORAL | Status: AC
  Filled 2022-02-07: qty 1

## 2022-02-07 MED ORDER — IOHEXOL 180 MG/ML  SOLN
10.0000 mL | Freq: Once | INTRAMUSCULAR | Status: AC
  Administered 2022-02-07: 10 mL via INTRA_ARTICULAR

## 2022-02-07 MED ORDER — DEXAMETHASONE SODIUM PHOSPHATE 10 MG/ML IJ SOLN
INTRAMUSCULAR | Status: AC
  Filled 2022-02-07: qty 1

## 2022-02-07 MED ORDER — ROPIVACAINE HCL 2 MG/ML IJ SOLN
9.0000 mL | Freq: Once | INTRAMUSCULAR | Status: AC
  Administered 2022-02-07: 9 mL via INTRA_ARTICULAR

## 2022-02-07 MED ORDER — DIAZEPAM 5 MG PO TABS
5.0000 mg | ORAL_TABLET | ORAL | Status: AC
  Administered 2022-02-07: 5 mg via ORAL

## 2022-02-07 MED ORDER — METHYLPREDNISOLONE ACETATE 80 MG/ML IJ SUSP
INTRAMUSCULAR | Status: AC
  Filled 2022-02-07: qty 1

## 2022-02-07 MED ORDER — LIDOCAINE HCL 2 % IJ SOLN
20.0000 mL | Freq: Once | INTRAMUSCULAR | Status: AC
  Administered 2022-02-07: 400 mg

## 2022-02-07 MED ORDER — LIDOCAINE HCL 2 % IJ SOLN
INTRAMUSCULAR | Status: AC
  Filled 2022-02-07: qty 20

## 2022-02-07 MED ORDER — ROPIVACAINE HCL 2 MG/ML IJ SOLN
9.0000 mL | Freq: Once | INTRAMUSCULAR | Status: AC
  Administered 2022-02-07: 9 mL via PERINEURAL

## 2022-02-07 MED ORDER — ROPIVACAINE HCL 2 MG/ML IJ SOLN
INTRAMUSCULAR | Status: AC
  Filled 2022-02-07: qty 20

## 2022-02-07 MED ORDER — IOHEXOL 180 MG/ML  SOLN
INTRAMUSCULAR | Status: AC
  Filled 2022-02-07: qty 20

## 2022-02-07 MED ORDER — DEXAMETHASONE SODIUM PHOSPHATE 10 MG/ML IJ SOLN
10.0000 mg | Freq: Once | INTRAMUSCULAR | Status: AC
Start: 2022-02-07 — End: 2022-02-07
  Administered 2022-02-07: 10 mg

## 2022-02-07 NOTE — Progress Notes (Signed)
Safety precautions to be maintained throughout the outpatient stay will include: orient to surroundings, keep bed in low position, maintain call bell within reach at all times, provide assistance with transfer out of bed and ambulation.  

## 2022-02-07 NOTE — Assessment & Plan Note (Signed)
-  Improved. Recommend to continue omeprazole 40 mg daily. If symptoms remain stable, in the future recommend trial of omeprazole 20 mg daily.

## 2022-02-07 NOTE — Progress Notes (Signed)
PROVIDER NOTE: Interpretation of information contained herein should be left to medically-trained personnel. Specific patient instructions are provided elsewhere under "Patient Instructions" section of medical record. This document was created in part using STT-dictation technology, any transcriptional errors that may result from this process are unintentional.  Patient: Andrew Townsend Type: Established DOB: February 16, 1951 MRN: [id number] PCP: Lorrene Reid, PA-C  Service: Procedure DOS: 02/07/2022 Setting: Ambulatory Location: Ambulatory outpatient facility Delivery: Face-to-face Provider: Gillis Santa, MD Specialty: Interventional Pain Management Specialty designation: 09 Location: Outpatient facility Ref. Prov.: Lorrene Reid, PA-C    Primary Reason for Visit: Interventional Pain Management Treatment. CC: Back Pain   Procedure:               Type: Sacroiliac Joint Steroid Injection #2   and bilateral piriformis injection #2 Laterality: Bilateral     Level: PSIS (Posterior Inferior Iliac Spine)  Imaging: Fluoroscopic guidance Anesthesia: Local anesthesia (1-2% Lidocaine) Anxiolysis: Oral Valium 5 mg DOS: 02/07/2022  Performed by: Gillis Santa, MD  Purpose: Diagnostic/Therapeutic Indications: Sacroiliac joint pain in the lower back and hip area severe enough to impact quality of life or function. Rationale (medical necessity): procedure needed and proper for the diagnosis and/or treatment of Andrew Townsend medical symptoms and needs. 1. 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 Piriformis syndrome of both sides    NAS-11 Pain score:   Pre-procedure: 6 /10   Post-procedure: 5 /10     Target: Interarticular sacroiliac joint. Location: Medial to the postero-medial edge of iliac spine. Region: Lumbosacral-sacrococcygeal. Approach: Inferior postero-medial percutaneous approach. Type of procedure: Percutaneous joint injection.  Position / Prep / Materials:  Position: Prone  Prep  solution: DuraPrep (Iodine Povacrylex [0.7% available iodine] and Isopropyl Alcohol, 74% w/w) Prep Area: Entire posterior lumbosacral area  Materials:  Tray: Block Needle(s):  Type: Spinal  Gauge (G): 25  Length: 3.5-in Qty: 2  Pre-op H&P Assessment:  Andrew Townsend is a 71 y.o. (year old), male patient, seen today for interventional treatment. He  has a past surgical history that includes Coronary artery bypass graft and Hernia repair. Andrew Townsend has a current medication list which includes the following prescription(s): albuterol, albuterol, allopurinol, apixaban, buprenorphine, eszopiclone, ezetimibe, trelegy ellipta, furosemide, gabapentin, metoprolol succinate, multivitamin, nitroglycerin, omeprazole, oxybutynin, oxycodone-acetaminophen, [START ON 03/04/2022] oxycodone-acetaminophen, potassium chloride er, rosuvastatin, and tizanidine. His primarily concern today is the Back Pain  Initial Vital Signs:  Pulse/HCG Rate:  (!) 57 ECG Heart Rate: 62 Temp: (!) 97.2 F (36.2 C) Resp: 16 BP: (!) 172/84 SpO2: 100 %  BMI: Estimated body mass index is 21.47 kg/m as calculated from the following:   Height as of this encounter: 5\' 6"  (1.676 m).   Weight as of this encounter: 133 lb (60.3 kg).  Risk Assessment: Allergies: Reviewed. He has No Known Allergies.  Allergy Precautions: None required Coagulopathies: Reviewed. None identified.  Blood-thinner therapy: None at this time Active Infection(s): Reviewed. None identified. Andrew Townsend is afebrile  Site Confirmation: Andrew Townsend was asked to confirm the procedure and laterality before marking the site Procedure checklist: Completed Consent: Before the procedure and under the influence of no sedative(s), amnesic(s), or anxiolytics, the patient was informed of the treatment options, risks and possible complications. To fulfill our ethical and legal obligations, as recommended by the American Medical Association's Code of Ethics, I have informed the  patient of my clinical impression; the nature and purpose of the treatment or procedure; the risks, benefits, and possible complications of the intervention; the alternatives, including doing  nothing; the risk(s) and benefit(s) of the alternative treatment(s) or procedure(s); and the risk(s) and benefit(s) of doing nothing. The patient was provided information about the general risks and possible complications associated with the procedure. These may include, but are not limited to: failure to achieve desired goals, infection, bleeding, organ or nerve damage, allergic reactions, paralysis, and death. In addition, the patient was informed of those risks and complications associated to the procedure, such as failure to decrease pain; infection; bleeding; organ or nerve damage with subsequent damage to sensory, motor, and/or autonomic systems, resulting in permanent pain, numbness, and/or weakness of one or several areas of the body; allergic reactions; (i.e.: anaphylactic reaction); and/or death. Furthermore, the patient was informed of those risks and complications associated with the medications. These include, but are not limited to: allergic reactions (i.e.: anaphylactic or anaphylactoid reaction(s)); adrenal axis suppression; blood sugar elevation that in diabetics may result in ketoacidosis or comma; water retention that in patients with history of congestive heart failure may result in shortness of breath, pulmonary edema, and decompensation with resultant heart failure; weight gain; swelling or edema; medication-induced neural toxicity; particulate matter embolism and blood vessel occlusion with resultant organ, and/or nervous system infarction; and/or aseptic necrosis of one or more joints. Finally, the patient was informed that Medicine is not an exact science; therefore, there is also the possibility of unforeseen or unpredictable risks and/or possible complications that may result in a catastrophic  outcome. The patient indicated having understood very clearly. We have given the patient no guarantees and we have made no promises. Enough time was given to the patient to ask questions, all of which were answered to the patient's satisfaction. Andrew Townsend has indicated that he wanted to continue with the procedure. Attestation: I, the ordering provider, attest that I have discussed with the patient the benefits, risks, side-effects, alternatives, likelihood of achieving goals, and potential problems during recovery for the procedure that I have provided informed consent. Date  Time: 02/07/2022  7:51 AM  Pre-Procedure Preparation:  Monitoring: As per clinic protocol. Respiration, ETCO2, SpO2, BP, heart rate and rhythm monitor placed and checked for adequate function Safety Precautions: Patient was assessed for positional comfort and pressure points before starting the procedure. Time-out: I initiated and conducted the "Time-out" before starting the procedure, as per protocol. The patient was asked to participate by confirming the accuracy of the "Time Out" information. Verification of the correct person, site, and procedure were performed and confirmed by me, the nursing staff, and the patient. "Time-out" conducted as per Joint Commission's Universal Protocol (UP.01.01.01). Time: 0825  Description/Narrative of Procedure:          Rationale (medical necessity): procedure needed and proper for the diagnosis and/or treatment of the patient's medical symptoms and needs. Procedural Technique Safety Precautions: Aspiration looking for blood return was conducted prior to all injections. At no point did we inject any substances, as a needle was being advanced. No attempts were made at seeking any paresthesias. Safe injection practices and needle disposal techniques used. Medications properly checked for expiration dates. SDV (single dose vial) medications used. Description of the Procedure: Protocol guidelines  were followed. The patient was assisted into a comfortable position. The target area was identified and the area prepped in the usual manner. Skin & deeper tissues infiltrated with local anesthetic. Appropriate amount of time allowed to pass for local anesthetics to take effect. The procedure needles were then advanced to the target area. Proper needle placement secured. Negative aspiration confirmed. Solution  injected in intermittent fashion, asking for systemic symptoms every 0.5cc of injectate. The needles were then removed and the area cleansed, making sure to leave some of the prepping solution back to take advantage of its long term bactericidal properties.  Technical description of procedure:  Fluoroscopy using a posterior anterior 45 degree angle from the midline aiming at the anterolateral aspect of the patient was used to find a direct path into the sacroiliac joint, the superior medial to posterior superior iliac spine.  The skin was marked where the desired target and the skin infiltrated with local anesthetics.  The procedure needle was then advanced until the joint was entered.  Once inside of the joint, we then proceeded to inject the desired solution.  10 cc solution made of 9 cc of 0.2% ropivacaine, 1 cc of methylprednisolone, 80 mg/cc.  5 cc injected into each SI joint after contrast confirmation under fluoroscopy.  Afterwards a right piriformis trigger point injection was done 1 cm inferior, 1 cm deep, 1 cm lateral to the inferior fissure of the SI joint.  Contrast was injected to confirm piriformis muscle striation.  10 cc solution made of 9 cc of 0.2% ropivacaine, 1 cc of Decadron 10 mg/cc.  5 cc injected into the right piriformis.While injecting, patient did not complain of any pain radiating down his right leg.  Afterwards a left piriformis trigger point injection was done 1 cm inferior, 1 cm deep, 1 cm lateral to the inferior fissure of the SI joint.  Contrast was injected to confirm  piriformis muscle striation.  10 cc solution made of 9 cc of 0.2% ropivacaine, 1 cc of Decadron 10 mg/cc.  5 cc injected into the left  piriformis.While injecting, patient did not complain of any pain radiating down his left leg.   Vitals:   02/07/22 0756 02/07/22 0825 02/07/22 0830 02/07/22 0834  BP: (!) 172/84 (!) 149/63 123/80 (!) 120/58  Pulse: (!) 57     Resp: 16 16 18 16   Temp: (!) 97.2 F (36.2 C)     SpO2: 100% 100% 100% 100%  Weight: 133 lb (60.3 kg)     Height: 5\' 6"  (1.676 m)       Start Time: 0825 hrs. End Time: 0834 hrs.  Post-operative Assessment:  Post-procedure Vital Signs:  Pulse/HCG Rate: (!) 5763 Temp: (!) 97.2 F (36.2 C) Resp: 16 BP:  (!) 120/58 SpO2: 100 %  EBL: None  Complications: No immediate post-treatment complications observed by team, or reported by patient.  Note: The patient tolerated the entire procedure well. A repeat set of vitals were taken after the procedure and the patient was kept under observation following institutional policy, for this type of procedure. Post-procedural neurological assessment was performed, showing return to baseline, prior to discharge. The patient was provided with post-procedure discharge instructions, including a section on how to identify potential problems. Should any problems arise concerning this procedure, the patient was given instructions to immediately contact us, at any time, without hesitation. In any case, we plan to contact the patient by telephone for a follow-up status report regarding this interventional procedure.  Comments:  No additional relevant information.  Plan of Care  Orders:  Orders Placed This Encounter  Procedures   DG PAIN [facility] C-ARM 1-60 MIN NO REPORT    Intraoperative interpretation by procedural physician at [facility].    Standing Status:   Standing    Number of Occurrences:   1    Order Specific Question:  Reason for exam:    Answer:   Assistance in needle  guidance and placement for procedures requiring needle placement in or near specific anatomical locations not easily accessible without such assistance.     Medications ordered for procedure: Meds ordered this encounter  Medications   iohexol (OMNIPAQUE) 180 MG/ML injection 10 mL    Must be Myelogram-compatible. If not available, you may substitute with a water-soluble, non-ionic, hypoallergenic, myelogram-compatible radiological contrast medium.   lidocaine (XYLOCAINE) 2 % (with pres) injection 400 mg   diazepam (VALIUM) tablet 5 mg    Make sure Flumazenil is available in the pyxis when using this medication. If oversedation occurs, administer 0.2 mg IV over 15 sec. If after 45 sec no response, administer 0.2 mg again over 1 min; may repeat at 1 min intervals; not to exceed 4 doses (1 mg)   dexamethasone (DECADRON) injection 10 mg   ropivacaine (PF) 2 mg/mL (0.2%) (NAROPIN) injection 9 mL   methylPREDNISolone acetate (DEPO-MEDROL) injection 80 mg   ropivacaine (PF) 2 mg/mL (0.2%) (NAROPIN) injection 9 mL   Medications administered: We administered iohexol, lidocaine, diazepam, dexamethasone, ropivacaine (PF) 2 mg/mL (0.2%), methylPREDNISolone acetate, and ropivacaine (PF) 2 mg/mL (0.2%).  See the medical record for exact dosing, route, and time of administration.  Follow-up plan:   Return in about 4 weeks (around 03/07/2022) for Post Procedure Evaluation, virtual.       B/L SI joint and piriformis 01/03/2022  Recent Visits Date Type Provider Dept  01/30/22 Office Visit Gillis Santa, MD [facility]  01/03/22 Procedure visit Gillis Santa, MD [facility]  12/21/21 Office Visit Gillis Santa, MD [facility]  12/07/21 Office Visit Gillis Santa, MD [facility]  Showing recent visits within past 90 days and meeting all other requirements Today's Visits Date Type Provider Dept  02/07/22 Procedure visit Gillis Santa, MD [facility]   Showing today's visits and meeting all other requirements Future Appointments Date Type Provider Dept  03/08/22 Appointment Gillis Santa, MD [facility]  Showing future appointments within next 90 days and meeting all other requirements  Disposition: Discharge home  Discharge (Date  Time): 02/07/2022; 0845 hrs.   Primary Care Physician: Lorrene Reid, PA-C Location: [facility] Note by: Gillis Santa, MD Date: 02/07/2022; Time: 8:55 AM  Disclaimer:  Medicine is not an exact science. The only guarantee in medicine is that nothing is guaranteed. It is important to note that the decision to proceed with this intervention was based on the information collected from the patient. The Data and conclusions were drawn from the patient's questionnaire, the interview, and the physical examination. Because the information was provided in large part by the patient, it cannot be guaranteed that it has not been purposely or unconsciously manipulated. Every effort has been made to obtain as much relevant data as possible for this evaluation. It is important to note that the conclusions that lead to this procedure are derived in large part from the available data. Always take into account that the treatment will also be dependent on availability of resources and existing treatment guidelines, considered by other Pain Management Practitioners as being common knowledge and practice, at the time of the intervention. For Medico-Legal purposes, it is also important to point out that variation in procedural techniques and pharmacological choices are the acceptable norm. The indications, contraindications, technique, and results of the above procedure should only be interpreted and judged by a Board-Certified Interventional Pain Specialist with extensive  familiarity and expertise in the same exact procedure and technique.

## 2022-02-07 NOTE — Progress Notes (Signed)
Established patient visit   Patient: Andrew Townsend   DOB: 25-Nov-1950   71 y.o. Male  MRN: [id number] Visit Date: 02/07/2022  Chief Complaint  Patient presents with   Follow-up   Subjective    HPI  Patient presents for chronic follow-up visit.   COPD: Changed Stiolto to Trelegy which reports is working well and better. Does report his dentist put him on nystatin solution for thrush.   Insomnia: Changed Ambien to Lunesta. Patient reports Lunesta 1 and 2 mg were not as effective and Ambien seemed to work better so wants to resume Ambien.   GERD: Changed Protonix 40 mg to Prilosec 40 mg which is working fine. Acid reflux has been better.      Medications: Outpatient Medications Prior to Visit  Medication Sig   albuterol (PROVENTIL) (2.5 MG/3ML) 0.083% nebulizer solution INHALE THE CONTENTS OF 1 VIAL VIA NEBULIZER EVERY 6 HOURS AS NEEDED FOR WHEEZING OR SHORTNESS OF BREATH.   albuterol (VENTOLIN HFA) 108 (90 Base) MCG/ACT inhaler Inhale 2 puffs into the lungs every 6 (six) hours as needed for wheezing or shortness of breath.   allopurinol (ZYLOPRIM) 100 MG tablet Take 1 tablet (100 mg total) by mouth daily.   apixaban (ELIQUIS) 2.5 MG TABS tablet Take 1 tablet (2.5 mg total) by mouth 2 (two) times daily.   buprenorphine (BUTRANS) 20 MCG/HR PTWK Place 1 patch onto the skin once a week.   ezetimibe (ZETIA) 10 MG tablet Take 1 tablet (10 mg total) by mouth daily.   Fluticasone-Umeclidin-Vilant (TRELEGY ELLIPTA) 100-62.5-25 MCG/ACT AEPB Inhale 1 puff into the lungs daily.   furosemide (LASIX) 40 MG tablet Take 1 tablet (40 mg total) by mouth daily as needed for fluid.   gabapentin (NEURONTIN) 300 MG capsule Take 2 capsules (600 mg total) by mouth at bedtime.   metoprolol succinate (TOPROL-XL) 100 MG 24 hr tablet Take 1.5 tablets (150 mg total) by mouth daily.   Multiple Vitamin (MULTIVITAMIN) tablet Take 1 tablet by mouth daily.   nitroGLYCERIN (NITROSTAT) 0.4 MG SL tablet Place 1 tablet  (0.4 mg total) under the tongue every 5 (five) minutes as needed for chest pain.   omeprazole (PRILOSEC) 40 MG capsule Take 1 capsule (40 mg total) by mouth daily.   oxybutynin (DITROPAN-XL) 10 MG 24 hr tablet Take 1 tablet (10 mg total) by mouth daily.   oxyCODONE-acetaminophen (PERCOCET) 10-325 MG tablet Take 1 tablet by mouth every 8 (eight) hours as needed for pain.   [START ON 03/04/2022] oxyCODONE-acetaminophen (PERCOCET) 10-325 MG tablet Take 1 tablet by mouth every 8 (eight) hours as needed for pain.   Potassium Chloride ER 20 MEQ TBCR Take 1 tablet by mouth daily.   rosuvastatin (CRESTOR) 40 MG tablet Take 1 tablet (40 mg total) by mouth at bedtime.   tiZANidine (ZANAFLEX) 4 MG tablet Take 1 tablet (4 mg total) by mouth every 8 (eight) hours as needed.   [DISCONTINUED] eszopiclone (LUNESTA) 2 MG TABS tablet Take 1 tablet po QHS prn - take immediately prior to bedtime.  **patient needs to have appointment for additional refills. **   No facility-administered medications prior to visit.    Review of Systems Review of Systems:  A fourteen system review of systems was performed and found to be positive as per HPI.  Last CBC Lab Results  Component Value Date   WBC 10.6 05/23/2021   HGB 12.4 (L) 05/23/2021   HCT 39.3 05/23/2021   MCV 88 05/23/2021   MCH 27.7 05/23/2021  RDW 15.6 (H) 05/23/2021   PLT 291 03/22/1116   Last metabolic panel Lab Results  Component Value Date   GLUCOSE 94 07/20/2021   NA 141 07/20/2021   K 4.4 07/20/2021   CL 104 07/20/2021   CO2 16 (L) 07/20/2021   BUN 22 07/20/2021   CREATININE 1.69 (H) 07/20/2021   EGFR 43 (L) 07/20/2021   CALCIUM 10.4 (H) 07/20/2021   PROT 6.9 07/20/2021   ALBUMIN 4.4 07/20/2021   LABGLOB 2.5 07/20/2021   AGRATIO 1.8 07/20/2021   BILITOT 0.7 07/20/2021   ALKPHOS 92 07/20/2021   AST 30 07/20/2021   ALT 16 07/20/2021   Last lipids No results found for: "CHOL", "HDL", "LDLCALC", "LDLDIRECT", "TRIG", "CHOLHDL" Last  hemoglobin A1c No results found for: "HGBA1C" Last thyroid functions Lab Results  Component Value Date   TSH 4.020 05/23/2021   Last vitamin D No results found for: "25OHVITD2", "25OHVITD3", "VD25OH"     Objective    BP 128/75   Pulse (!) 54   Temp 98 F (36.7 C) (Temporal)   Wt 126 lb (57.2 kg)   BMI 20.34 kg/m  BP Readings from Last 3 Encounters:  02/07/22 128/75  02/07/22 (!) 120/58  01/30/22 (!) 152/91   Wt Readings from Last 3 Encounters:  02/07/22 126 lb (57.2 kg)  02/07/22 133 lb (60.3 kg)  01/30/22 130 lb (59 kg)    Physical Exam  General:  Well Developed, well nourished, appropriate for stated age.  Neuro:  Alert and oriented,  extra-ocular muscles intact  HEENT:  Normocephalic, atraumatic, neck supple  Skin:  no gross rash, warm, pink. Cardiac:  RRR Respiratory: Speaking in full sentences, unlabored. Vascular:  Ext warm, no cyanosis apprec.; cap RF less 2 sec. Psych:  No HI/SI, judgement and insight good, Euthymic mood. Full Affect.   No results found for any visits on 02/07/22.  Assessment & Plan      Problem List Items Addressed This Visit       Respiratory   COPD (chronic obstructive pulmonary disease) ([place]) - Primary    -Improved. Discussed with patient oral candidiasis is a potential sife effect with inhaler so recommend to monitor for re-occurrence of symptoms. Appropriate use discussed. Recommend to continue current medication regimen.        Digestive   Gastroesophageal reflux disease    -Improved. Recommend to continue omeprazole 40 mg daily. If symptoms remain stable, in the future recommend trial of omeprazole 20 mg daily.         Other   Insomnia    -Unchanged. Will trial Ambien 5 mg and melatonin 3-5 mg as needed for sleep. Pt deferred trial of seroquel at this time. Advised to let me know if Ambien and melatonin are non-ineffective and wants to try Seroquel. Pt verbalized understanding.       Relevant Medications   zolpidem  (AMBIEN) 5 MG tablet   Will request most recent labs with [facility].  Return in about 3 months (around 05/09/2022) for [place] and [place].        Lorrene Reid, PA-C  [facility] [phone] (phone) [phone] (fax)  [place]

## 2022-02-07 NOTE — Patient Instructions (Signed)
____________________________________________________________________________________________  Post-Procedure Discharge Instructions  Instructions: Apply ice:  Purpose: This will minimize any swelling and discomfort after procedure.  When: Day of procedure, as soon as you get home. How: Fill a plastic sandwich bag with crushed ice. Cover it with a small towel and apply to injection site. How long: (15 min on, 15 min off) Apply for 15 minutes then remove x 15 minutes.  Repeat sequence on day of procedure, until you go to bed. Apply heat:  Purpose: To treat any soreness and discomfort from the procedure. When: Starting the next day after the procedure. How: Apply heat to procedure site starting the day following the procedure. How long: May continue to repeat daily, until discomfort goes away. Food intake: Start with clear liquids (like water) and advance to regular food, as tolerated.  Physical activities: Keep activities to a minimum for the first 8 hours after the procedure. After that, then as tolerated. Driving: If you have received any sedation, be responsible and do not drive. You are not allowed to drive for 24 hours after having sedation. Blood thinner: (Applies only to those taking blood thinners) You may restart your blood thinner 6 hours after your procedure. Insulin: (Applies only to Diabetic patients taking insulin) As soon as you can eat, you may resume your normal dosing schedule. Infection prevention: Keep procedure site clean and dry. Shower daily and clean area with soap and water. Post-procedure Pain Diary: Extremely important that this be done correctly and accurately. Recorded information will be used to determine the next step in treatment. For the purpose of accuracy, follow these rules: Evaluate only the area treated. Do not report or include pain from an untreated area. For the purpose of this evaluation, ignore all other areas of pain, except for the treated area. After  your procedure, avoid taking a long nap and attempting to complete the pain diary after you wake up. Instead, set your alarm clock to go off every hour, on the hour, for the initial 8 hours after the procedure. Document the duration of the numbing medicine, and the relief you are getting from it. Do not go to sleep and attempt to complete it later. It will not be accurate. If you received sedation, it is likely that you were given a medication that may cause amnesia. Because of this, completing the diary at a later time may cause the information to be inaccurate. This information is needed to plan your care. Follow-up appointment: Keep your post-procedure follow-up evaluation appointment after the procedure (usually 2 weeks for most procedures, 6 weeks for radiofrequencies). DO NOT FORGET to bring you pain diary with you.   Expect: (What should I expect to see with my procedure?) From numbing medicine (AKA: Local Anesthetics): Numbness or decrease in pain. You may also experience some weakness, which if present, could last for the duration of the local anesthetic. Onset: Full effect within 15 minutes of injected. Duration: It will depend on the type of local anesthetic used. On the average, 1 to 8 hours.  From steroids (Applies only if steroids were used): Decrease in swelling or inflammation. Once inflammation is improved, relief of the pain will follow. Onset of benefits: Depends on the amount of swelling present. The more swelling, the longer it will take for the benefits to be seen. In some cases, up to 10 days. Duration: Steroids will stay in the system x 2 weeks. Duration of benefits will depend on multiple posibilities including persistent irritating factors. Side-effects: If present, they   may typically last 2 weeks (the duration of the steroids). Frequent: Cramps (if they occur, drink Gatorade and take over-the-counter Magnesium 450-500 mg once to twice a day); water retention with temporary  weight gain; increases in blood sugar; decreased immune system response; increased appetite. Occasional: Facial flushing (red, warm cheeks); mood swings; menstrual changes. Uncommon: Long-term decrease or suppression of natural hormones; bone thinning. (These are more common with higher doses or more frequent use. This is why we prefer that our patients avoid having any injection therapies in other practices.)  Very Rare: Severe mood changes; psychosis; aseptic necrosis. From procedure: Some discomfort is to be expected once the numbing medicine wears off. This should be minimal if ice and heat are applied as instructed.  Call if: (When should I call?) You experience numbness and weakness that gets worse with time, as opposed to wearing off. New onset bowel or bladder incontinence. (Applies only to procedures done in the spine)  Emergency Numbers: Durning business hours (Monday - Thursday, 8:00 AM - 4:00 PM) (Friday, 9:00 AM - 12:00 Noon): [phone] After hours: [phone] NOTE: If you are having a problem and are unable connect with, or to talk to a provider, then go to your nearest urgent care or emergency department. If the problem is serious and urgent, please call 911. ____________________________________________________________________________________________  Sacroiliac (SI) Joint Injection Patient Information  Description: The sacroiliac joint connects the scrum (very low back and tailbone) to the ilium (a pelvic bone which also forms half of the hip joint).  Normally this joint experiences very little motion.  When this joint becomes inflamed or unstable low back and or hip and pelvis pain may result.  Injection of this joint with local anesthetics (numbing medicines) and steroids can provide diagnostic information and reduce pain.  This injection is performed with the aid of x-ray guidance into the tailbone area while you are lying on your stomach.   You may experience an  electrical sensation down the leg while this is being done.  You may also experience numbness.  We also may ask if we are reproducing your normal pain during the injection.  Conditions which may be treated SI injection:  Low back, buttock, hip or leg pain  Preparation for the Injection:  Do not eat any solid food or dairy products within 8 hours of your appointment.  You may drink clear liquids up to 3 hours before appointment.  Clear liquids include water, black coffee, juice or soda.  No milk or cream please. You may take your regular medications, including pain medications with a sip of water before your appointment.  Diabetics should hold regular insulin (if take separately) and take 1/2 normal NPH dose the morning of the procedure.  Carry some sugar containing items with you to your appointment. A driver must accompany you and be prepared to drive you home after your procedure. Bring all of your current medications with you. An IV may be inserted and sedation may be given at the discretion of the physician. A blood pressure cuff, EKG and other monitors will often be applied during the procedure.  Some patients may need to have extra oxygen administered for a short period.  You will be asked to provide medical information, including your allergies, prior to the procedure.  We must know immediately if you are taking blood thinners (like Coumadin/Warfarin) or if you are allergic to IV iodine contrast (dye).  We must know if you could possible be pregnant.  Possible side   effects:  Bleeding from needle site Infection (rare, may require surgery) Nerve injury (rare) Numbness & tingling (temporary) A brief convulsion or seizure Light-headedness (temporary) Pain at injection site (several days) Decreased blood pressure (temporary) Weakness in the leg (temporary)   Call if you experience:  New onset weakness or numbness of an extremity below the injection site that last more than 8  hours. Hives or difficulty breathing ( go to the emergency room) Inflammation or drainage at the injection site Any new symptoms which are concerning to you  Please note:  Although the local anesthetic injected can often make your back/ hip/ buttock/ leg feel good for several hours after the injections, the pain will likely return.  It takes 3-7 days for steroids to work in the sacroiliac area.  You may not notice any pain relief for at least that one week.  If effective, we will often do a series of three injections spaced 3-6 weeks apart to maximally decrease your pain.  After the initial series, we generally will wait some months before a repeat injection of the same type.  If you have any questions, please call [phone] [facility]   

## 2022-02-07 NOTE — Assessment & Plan Note (Signed)
-  Improved. Discussed with patient oral candidiasis is a potential sife effect with inhaler so recommend to monitor for re-occurrence of symptoms. Appropriate use discussed. Recommend to continue current medication regimen.

## 2022-02-07 NOTE — Patient Instructions (Signed)
Insomnia Insomnia is a sleep disorder that makes it difficult to fall asleep or stay asleep. Insomnia can cause fatigue, low energy, difficulty concentrating, mood swings, and poor performance at work or school. There are three different ways to classify insomnia: Difficulty falling asleep. Difficulty staying asleep. Waking up too early in the morning. Any type of insomnia can be long-term (chronic) or short-term (acute). Both are common. Short-term insomnia usually lasts for 3 months or less. Chronic insomnia occurs at least three times a week for longer than 3 months. What are the causes? Insomnia may be caused by another condition, situation, or substance, such as: Having certain mental health conditions, such as anxiety and depression. Using caffeine, alcohol, tobacco, or drugs. Having gastrointestinal conditions, such as gastroesophageal reflux disease (GERD). Having certain medical conditions. These include: Asthma. Alzheimer's disease. Stroke. Chronic pain. An overactive thyroid gland (hyperthyroidism). Other sleep disorders, such as restless legs syndrome and sleep apnea. Menopause. Sometimes, the cause of insomnia may not be known. What increases the risk? Risk factors for insomnia include: Gender. Females are affected more often than males. Age. Insomnia is more common as people get older. Stress and certain medical and mental health conditions. Lack of exercise. Having an irregular work schedule. This may include working night shifts and traveling between different time zones. What are the signs or symptoms? If you have insomnia, the main symptom is having trouble falling asleep or having trouble staying asleep. This may lead to other symptoms, such as: Feeling tired or having low energy. Feeling nervous about going to sleep. Not feeling rested in the morning. Having trouble concentrating. Feeling irritable, anxious, or depressed. How is this diagnosed? This condition  may be diagnosed based on: Your symptoms and medical history. Your health care provider may ask about: Your sleep habits. Any medical conditions you have. Your mental health. A physical exam. How is this treated? Treatment for insomnia depends on the cause. Treatment may focus on treating an underlying condition that is causing the insomnia. Treatment may also include: Medicines to help you sleep. Counseling or therapy. Lifestyle adjustments to help you sleep better. Follow these instructions at home: Eating and drinking  Limit or avoid alcohol, caffeinated beverages, and products that contain nicotine and tobacco, especially close to bedtime. These can disrupt your sleep. Do not eat a large meal or eat spicy foods right before bedtime. This can lead to digestive discomfort that can make it hard for you to sleep. Sleep habits  Keep a sleep diary to help you and your health care provider figure out what could be causing your insomnia. Write down: When you sleep. When you wake up during the night. How well you sleep and how rested you feel the next day. Any side effects of medicines you are taking. What you eat and drink. Make your bedroom a dark, comfortable place where it is easy to fall asleep. Put up shades or blackout curtains to block light from outside. Use a white noise machine to block noise. Keep the temperature cool. Limit screen use before bedtime. This includes: Not watching TV. Not using your smartphone, tablet, or computer. Stick to a routine that includes going to bed and waking up at the same times every day and night. This can help you fall asleep faster. Consider making a quiet activity, such as reading, part of your nighttime routine. Try to avoid taking naps during the day so that you sleep better at night. Get out of bed if you are still awake after   15 minutes of trying to sleep. Keep the lights down, but try reading or doing a quiet activity. When you feel  sleepy, go back to bed. General instructions Take over-the-counter and prescription medicines only as told by your health care provider. Exercise regularly as told by your health care provider. However, avoid exercising in the hours right before bedtime. Use relaxation techniques to manage stress. Ask your health care provider to suggest some techniques that may work well for you. These may include: Breathing exercises. Routines to release muscle tension. Visualizing peaceful scenes. Make sure that you drive carefully. Do not drive if you feel very sleepy. Keep all follow-up visits. This is important. Contact a health care provider if: You are tired throughout the day. You have trouble in your daily routine due to sleepiness. You continue to have sleep problems, or your sleep problems get worse. Get help right away if: You have thoughts about hurting yourself or someone else. Get help right away if you feel like you may hurt yourself or others, or have thoughts about taking your own life. Go to your nearest emergency room or: Call 911. Call the National Suicide Prevention Lifeline at 1-800-273-8255 or 988. This is open 24 hours a day. Text the Crisis Text Line at 741741. Summary Insomnia is a sleep disorder that makes it difficult to fall asleep or stay asleep. Insomnia can be long-term (chronic) or short-term (acute). Treatment for insomnia depends on the cause. Treatment may focus on treating an underlying condition that is causing the insomnia. Keep a sleep diary to help you and your health care provider figure out what could be causing your insomnia. This information is not intended to replace advice given to you by your health care provider. Make sure you discuss any questions you have with your health care provider. Document Revised: 04/17/2021 Document Reviewed: 04/17/2021 Elsevier Patient Education  2023 [employer]  

## 2022-02-07 NOTE — Assessment & Plan Note (Signed)
-  Unchanged. Will trial Ambien 5 mg and melatonin 3-5 mg as needed for sleep. Pt deferred trial of seroquel at this time. Advised to let me know if Ambien and melatonin are non-ineffective and wants to try Seroquel. Pt verbalized understanding.

## 2022-02-08 ENCOUNTER — Telehealth: Payer: Self-pay

## 2022-02-08 NOTE — Telephone Encounter (Signed)
Post procedure phone call.  LM 

## 2022-02-11 ENCOUNTER — Encounter (HOSPITAL_BASED_OUTPATIENT_CLINIC_OR_DEPARTMENT_OTHER): Payer: Self-pay | Admitting: Cardiology

## 2022-02-11 DIAGNOSIS — I480 Paroxysmal atrial fibrillation: Secondary | ICD-10-CM | POA: Insufficient documentation

## 2022-02-23 ENCOUNTER — Other Ambulatory Visit: Payer: Self-pay | Admitting: Physician Assistant

## 2022-03-08 ENCOUNTER — Ambulatory Visit
Payer: Medicare PPO | Attending: Student in an Organized Health Care Education/Training Program | Admitting: Student in an Organized Health Care Education/Training Program

## 2022-03-08 ENCOUNTER — Encounter: Payer: Self-pay | Admitting: Student in an Organized Health Care Education/Training Program

## 2022-03-08 DIAGNOSIS — M47818 Spondylosis without myelopathy or radiculopathy, sacral and sacrococcygeal region: Secondary | ICD-10-CM

## 2022-03-08 DIAGNOSIS — G894 Chronic pain syndrome: Secondary | ICD-10-CM

## 2022-03-08 DIAGNOSIS — G5703 Lesion of sciatic nerve, bilateral lower limbs: Secondary | ICD-10-CM

## 2022-03-08 NOTE — Progress Notes (Signed)
Patient: Andrew Townsend  Service Category: E/M  Provider: Gillis Santa, MD  DOB: [birth year]  DOS: 03/08/2022  Location: Office  MRN: [id number]  Setting: Ambulatory outpatient  Referring Provider: Lorrene Reid, PA-C  Type: Established Patient  Specialty: Interventional Pain Management  PCP: Lorrene Reid, PA-C  Location: Remote location  Delivery: TeleHealth     Virtual Encounter - Pain Management PROVIDER NOTE: Information contained herein reflects review and annotations entered in association with encounter. Interpretation of such information and data should be left to medically-trained personnel. Information provided to patient can be located elsewhere in the medical record under "Patient Instructions". Document created using STT-dictation technology, any transcriptional errors that may result from process are unintentional.    Contact & Pharmacy Preferred: [phone] Home: [phone] (home) Mobile: [phone] (mobile) E-mail: [email or web address]  [place], [place] [address] [place] [address] Phone: [phone] Fax: [phone]   Pre-screening  Andrew Townsend offered "in-person" vs "virtual" encounter. He indicated preferring virtual for this encounter.   Reason COVID-19*  Social distancing based on CDC and AMA recommendations.   I contacted Andrew Townsend on 03/08/2022 via telephone.      I clearly identified myself as Gillis Santa, MD. I verified that I was speaking with the correct person using two identifiers (Name: Joni Norrod, and date of birth: Sep 13, 1950).  Consent I sought verbal advanced consent from Andrew Townsend for virtual visit interactions. I informed Andrew Townsend of possible security and privacy concerns, risks, and limitations associated with providing "not-in-person" medical evaluation and management services. I also informed Andrew Townsend of the availability of "in-person" appointments. Finally, I informed him that there would be  a charge for the virtual visit and that he could be  personally, fully or partially, financially responsible for it. Andrew Townsend expressed understanding and agreed to proceed.   Historic Elements   Andrew Townsend is a 71 y.o. year old, male patient evaluated today after our last contact on 02/07/2022. Andrew Townsend  has a past medical history of Arthritis, Blood transfusion without reported diagnosis, CAD (coronary artery disease), Cataract, Chronic kidney disease, COPD (chronic obstructive pulmonary disease) ([place]), GERD (gastroesophageal reflux disease), Hyperlipidemia, Hypertension, 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), Myocardial infarction ([place]), and Stroke ([place]). He also  has a past surgical history that includes Coronary artery bypass graft and Hernia repair. Andrew Townsend has a current medication list which includes the following prescription(s): albuterol, albuterol, allopurinol, apixaban, buprenorphine, ezetimibe, trelegy ellipta, furosemide, gabapentin, metoprolol succinate, multivitamin, nitroglycerin, omeprazole, oxybutynin, oxycodone-acetaminophen, potassium chloride er, rosuvastatin, tizanidine, and zolpidem. He  reports that he has been smoking e-cigarettes. He has never used smokeless tobacco. He reports that he does not use drugs. No history on file for alcohol use. Andrew Townsend has No Known Allergies.   HPI  Today, he is being contacted for a post-procedure assessment.   Post-procedure evaluation   Type: Sacroiliac Joint Steroid Injection #2   and bilateral piriformis injection #2 Laterality: Bilateral     Level: PSIS (Posterior Inferior Iliac Spine)  Imaging: Fluoroscopic guidance Anesthesia: Local anesthesia (1-2% Lidocaine) Anxiolysis: Oral Valium 5 mg DOS: 02/07/2022  Performed by: Gillis Santa, MD  Purpose: Diagnostic/Therapeutic Indications: Sacroiliac joint pain in the lower back and hip area severe enough to impact quality of life or  function. Rationale (medical necessity): procedure needed and proper for the diagnosis and/or treatment of Andrew Townsend medical symptoms and needs. 1. 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  Piriformis syndrome of both sides    NAS-11 Pain score:   Pre-procedure: 6 /10   Post-procedure: 5 /10      Effectiveness:  Initial hour after procedure: 65 %  Subsequent 4-6 hours post-procedure: 65 %  Analgesia past initial 6 hours: 65 % (lasting 3 weeks)  Ongoing improvement:  Analgesic:  40-50% Function: Andrew Townsend reports improvement in function ROM: Andrew Townsend reports improvement in ROM   Laboratory Chemistry Profile   Renal Lab Results  Component Value Date   BUN 22 07/20/2021   CREATININE 1.69 (H) 07/20/2021   BCR 13 07/20/2021    Hepatic Lab Results  Component Value Date   AST 30 07/20/2021   ALT 16 07/20/2021   ALBUMIN 4.4 07/20/2021   ALKPHOS 92 07/20/2021    Electrolytes Lab Results  Component Value Date   NA 141 07/20/2021   K 4.4 07/20/2021   CL 104 07/20/2021   CALCIUM 10.4 (H) 07/20/2021    Bone No results found for: "VD25OH", "VD125OH2TOT", "ZT2458KD9", "IP3825KN3", "25OHVITD1", "25OHVITD2", "25OHVITD3", "TESTOFREE", "TESTOSTERONE"  Inflammation (CRP: Acute Phase) (ESR: Chronic Phase) No results found for: "CRP", "ESRSEDRATE", "LATICACIDVEN"       Note: Above Lab results reviewed.   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 Diagnoses of Piriformis syndrome of both sides and Chronic pain syndrome were also pertinent to this visit.  [facility] [place] states that he did respond more favorably to his previous SI joint and piriformis injection.  He states that he has less buttock pain, hip pain and groin pain.  He is able to stand and walk an extended distance without having as much pain.  He is experiencing more cramps in his bilateral calves.  He is taking a multivitamin.  I encouraged him to drink more  water and also consider magnesium at night to help out with his muscle cramps.  Follow-up in 1 month for medication management face-to-face.   Follow-up plan:   Return in about 4 weeks (around 04/05/2022) for Medication Management, in person.     B/L SI joint and piriformis 01/03/2022, 02/07/22   Recent Visits Date Type Provider Dept  02/07/22 Procedure visit Gillis Santa, MD [facility]  01/30/22 Office Visit Gillis Santa, MD [facility]  01/03/22 Procedure visit Gillis Santa, MD [facility]  12/21/21 Office Visit Gillis Santa, MD [facility]  Showing recent visits within past 90 days and meeting all other requirements Today's Visits Date Type Provider Dept  03/08/22 Office Visit Gillis Santa, MD [facility]  Showing today's visits and meeting all other requirements Future Appointments No visits were found meeting these conditions. Showing future appointments within next 90 days and meeting all other requirements  I discussed the assessment and treatment plan with the patient. The patient was provided an opportunity to ask questions and all were answered. The patient agreed with the plan and demonstrated an understanding of the instructions.  Patient advised to call back or seek an in-person evaluation if the symptoms or condition worsens.  Duration of encounter: 12mnutes.  Note by: BGillis Santa MD Date: 03/08/2022; Time: 10:21 AM

## 2022-03-13 ENCOUNTER — Ambulatory Visit (INDEPENDENT_AMBULATORY_CARE_PROVIDER_SITE_OTHER): Payer: Medicare PPO

## 2022-03-13 DIAGNOSIS — I255 Ischemic cardiomyopathy: Secondary | ICD-10-CM | POA: Diagnosis not present

## 2022-03-13 DIAGNOSIS — I420 Dilated cardiomyopathy: Secondary | ICD-10-CM | POA: Diagnosis not present

## 2022-03-16 LAB — CUP PACEART REMOTE DEVICE CHECK
Battery Remaining Longevity: 53 mo
Battery Voltage: 2.98 V
Brady Statistic RV Percent Paced: 1.43 %
Date Time Interrogation Session: 20231026151450
HighPow Impedance: 57 Ohm
Implantable Lead Connection Status: 753985
Implantable Lead Implant Date: 20180209
Implantable Lead Location: 753860
Implantable Lead Model: 6935
Implantable Pulse Generator Implant Date: 20180209
Lead Channel Impedance Value: 304 Ohm
Lead Channel Impedance Value: 361 Ohm
Lead Channel Sensing Intrinsic Amplitude: 1.875 mV
Lead Channel Sensing Intrinsic Amplitude: 1.875 mV
Lead Channel Setting Pacing Amplitude: 2 V
Lead Channel Setting Pacing Pulse Width: 0.5 ms
Lead Channel Setting Sensing Sensitivity: 0.3 mV
Zone Setting Status: 755011

## 2022-03-30 ENCOUNTER — Other Ambulatory Visit: Payer: Self-pay | Admitting: Physician Assistant

## 2022-03-30 DIAGNOSIS — E785 Hyperlipidemia, unspecified: Secondary | ICD-10-CM

## 2022-04-02 NOTE — Progress Notes (Signed)
Remote ICD transmission.   

## 2022-04-03 DIAGNOSIS — E875 Hyperkalemia: Secondary | ICD-10-CM | POA: Insufficient documentation

## 2022-04-04 ENCOUNTER — Encounter: Payer: Self-pay | Admitting: Student in an Organized Health Care Education/Training Program

## 2022-04-04 ENCOUNTER — Ambulatory Visit
Payer: Medicare PPO | Attending: Student in an Organized Health Care Education/Training Program | Admitting: Student in an Organized Health Care Education/Training Program

## 2022-04-04 VITALS — BP 169/89 | HR 51 | Temp 97.9°F | Resp 14 | Ht 66.0 in | Wt 130.0 lb

## 2022-04-04 DIAGNOSIS — G894 Chronic pain syndrome: Secondary | ICD-10-CM | POA: Insufficient documentation

## 2022-04-04 DIAGNOSIS — M47816 Spondylosis without myelopathy or radiculopathy, lumbar region: Secondary | ICD-10-CM | POA: Diagnosis not present

## 2022-04-04 DIAGNOSIS — Z9889 Other specified postprocedural states: Secondary | ICD-10-CM | POA: Diagnosis present

## 2022-04-04 DIAGNOSIS — M4125 Other idiopathic scoliosis, thoracolumbar region: Secondary | ICD-10-CM | POA: Diagnosis not present

## 2022-04-04 DIAGNOSIS — I255 Ischemic cardiomyopathy: Secondary | ICD-10-CM | POA: Diagnosis present

## 2022-04-04 DIAGNOSIS — M47818 Spondylosis without myelopathy or radiculopathy, sacral and sacrococcygeal region: Secondary | ICD-10-CM | POA: Diagnosis not present

## 2022-04-04 DIAGNOSIS — Z95810 Presence of automatic (implantable) cardiac defibrillator: Secondary | ICD-10-CM | POA: Insufficient documentation

## 2022-04-04 DIAGNOSIS — G5703 Lesion of sciatic nerve, bilateral lower limbs: Secondary | ICD-10-CM | POA: Diagnosis not present

## 2022-04-04 MED ORDER — OXYCODONE-ACETAMINOPHEN 10-325 MG PO TABS: 1.0000 | ORAL_TABLET | Freq: Three times a day (TID) | ORAL | 0 refills | Status: AC | PRN

## 2022-04-04 MED ORDER — OXYCODONE-ACETAMINOPHEN 10-325 MG PO TABS: 1.0000 | ORAL_TABLET | Freq: Three times a day (TID) | ORAL | 0 refills | Status: DC | PRN

## 2022-04-04 MED ORDER — BUPRENORPHINE 20 MCG/HR TD PTWK: 1.0000 | MEDICATED_PATCH | TRANSDERMAL | 2 refills | Status: AC

## 2022-04-04 NOTE — Progress Notes (Signed)
PROVIDER NOTE: Information contained herein reflects review and annotations entered in association with encounter. Interpretation of such information and data should be left to medically-trained personnel. Information provided to patient can be located elsewhere in the medical record under "Patient Instructions". Document created using STT-dictation technology, any transcriptional errors that may result from process are unintentional.    Patient: [name]  Service Category: E/M  Provider: Gillis Santa, MD  DOB: May 15, 1951  DOS: 04/04/2022  Referring Provider: Ellouise Newer  MRN: [id number]  Specialty: Interventional Pain Management  PCP: Lorrene Reid, PA-C  Type: Established Patient  Setting: Ambulatory outpatient    Location: Office  Delivery: Face-to-face     HPI  Mr. Aceton Kinnear, a 71 y.o. 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 Mr. Martucci primary complain today is Back Pain (lower) Last encounter: My last encounter with him was on 03/08/2022 Pertinent problems: 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 Pain Assessment: Severity of Chronic pain is reported as a 7 /10. Location: Back Lower, Right, Left/right leg to the knee. Onset: More than a month ago. Quality: Sharp. Timing: Constant. Modifying factor(s): sitting, relaxing, heat, medications. Vitals:  height is _0  (1.676 m) and weight is 130 lb (59 kg). His temporal temperature is 97.9 F (36.6 C). His blood pressure is  169/89 (abnormal) and his pulse is 51 (abnormal). His respiration is 14 and oxygen saturation is 99%.   Reason for encounter: medication management.  No significant change in Donaldson's medical history since his last visit with me.  He is status post bilateral SI joint and piriformis injection on 02/07/2022 that is still providing him with some pain relief.  He has been without his Butrans patch for the last 5 days and has noticed an increase in his pain.  He believes that it is working more than he appreciated since he has been without it for the last 5 days.  I will refill as below.   HPI from initial clinic note: Thorin is a pleasant 71 year old male who has moved from [facility] hoping to establish with pain management.  Patient has a history of bilateral shoulder pain status post left rotator cuff surgery x1, right rotator cuff surgery x2, low back pain related to lumbar spinal stenosis and lumbar degenerative disc disease and lumbar facet arthropathy.  He has been on chronic opioid therapy which includes Butrans patch at 15 mcg an hour along with Percocet 10 mg 3 times daily as needed.  He also takes gabapentin 300 mg nightly which she is hoping to increase the dose of.  He has difficulty ambulating.  He states that he has to sit down and rest given weakness and pain in bilateral legs.  He also has a history of coronary artery disease status post CABG x4.  He has a ICD in place along with COPD, stage IV chronic kidney disease for which she needs to establish with nephrologist.  He has a right renal artery stent in place.  He is complaining of bilateral hip pain, SI joint pain as well as bilateral knee pain.  He is on  Eliquis for his cardiac disease.  Of note, he has had many injections done in the past at his previous pain clinic including diagnostic lumbar facet medial branch nerve blocks, subsequent lumbar radiofrequency ablations with limited response.  He is also had multiple epidurals with  limited response as well.  He is open to interventional options for pain management.    Pharmacotherapy Assessment  Analgesic: Butrans patch, 20 mcg an hour, Percocet 10 mg 3 times daily as needed  Monitoring: [place] PMP: PDMP reviewed during this encounter.       Pharmacotherapy: No side-effects or adverse reactions reported. Compliance: No problems identified. Effectiveness: Clinically acceptable.  Landis Martins, RN  04/04/2022  1:54 PM  Sign when Signing Visit Nursing Pain Medication Assessment:  Safety precautions to be maintained throughout the outpatient stay will include: orient to surroundings, keep bed in low position, maintain call bell within reach at all times, provide assistance with transfer out of bed and ambulation.  Medication Inspection Compliance: Mr. Elsen did not comply with our request to bring his pills to be counted. He was reminded that bringing the medication bottles, even when empty, is a requirement.  Medication: None brought in. Pill/Patch Count: None available to be counted. Bottle Appearance: No container available. Did not bring bottle(s) to appointment. Filled Date: N/A Last Medication intake:  Today Safety precautions to be maintained throughout the outpatient stay will include: orient to surroundings, keep bed in low position, maintain call bell within reach at all times, provide assistance with transfer out of bed and ambulation.     No results found for: "CBDTHCR" No results found for: "D8THCCBX" No results found for: "D9THCCBX"  UDS:  Summary  Date Value Ref Range Status  12/07/2021 Note  Final    Comment:    ==================================================================== Compliance Drug Analysis, Ur ==================================================================== Test                             Result       Flag       Units  Drug Present and Declared for Prescription Verification   Oxycodone                      6752          EXPECTED   ng/mg creat   Oxymorphone                    1668         EXPECTED   ng/mg creat   Noroxycodone                   6775         EXPECTED   ng/mg creat   Noroxymorphone                 719          EXPECTED   ng/mg creat    Sources of oxycodone are scheduled prescription medications.    Oxymorphone, noroxycodone, and noroxymorphone are expected    metabolites of oxycodone. Oxymorphone is also available as a    scheduled prescription medication.    Buprenorphine                  7            EXPECTED   ng/mg creat   Norbuprenorphine  9            EXPECTED   ng/mg creat    Source of buprenorphine is a scheduled prescription medication.    Norbuprenorphine is an expected metabolite of buprenorphine.    Gabapentin                     PRESENT      EXPECTED   Tizanidine                     PRESENT      EXPECTED   Zopiclone/Eszopiclone          PRESENT      EXPECTED    Eszopiclone is detected as zopiclone (racemic).    Acetaminophen                  PRESENT      EXPECTED   Metoprolol                     PRESENT      EXPECTED  Drug Present not Declared for Prescription Verification   Ibuprofen                      PRESENT      UNEXPECTED ==================================================================== Test                      Result    Flag   Units      Ref Range   Creatinine              75               mg/dL      >=20 ==================================================================== Declared Medications:  The flagging and interpretation on this report are based on the  following declared medications.  Unexpected results may arise from  inaccuracies in the declared medications.   **Note: The testing scope of this panel includes these medications:   Gabapentin (Neurontin)  Metoprolol (Toprol)  Oxycodone (Percocet)   **Note: The testing scope of this panel does not include small to  moderate amounts of these reported medications:   Acetaminophen  (Percocet)  Buprenorphine Patch (BuTrans)  Eszopiclone (Lunesta)  Tizanidine (Zanaflex)   **Note: The testing scope of this panel does not include the  following reported medications:   Albuterol (Ventolin HFA)  Allopurinol (Zyloprim)  Apixaban (Eliquis)  Ezetimibe (Zetia)  Fluticasone (Trelegy)  Furosemide (Lasix)  Nitroglycerin (Nitrostat)  Omeprazole (Prilosec)  Oxybutynin (Ditropan)  Potassium Chloride  Rosuvastatin (Crestor)  Umeclidinium (Trelegy)  Vilanterol (Trelegy) ==================================================================== For clinical consultation, please call [phone]. ====================================================================       ROS  Constitutional: Denies any fever or chills Gastrointestinal: No reported hemesis, hematochezia, vomiting, or acute GI distress Musculoskeletal:  Low back, bilateral SI joint and piriformis pain Neurological: No reported episodes of acute onset apraxia, aphasia, dysarthria, agnosia, amnesia, paralysis, loss of coordination, or loss of consciousness  Medication Review  Fluticasone-Umeclidin-Vilant, Magnesium Oxide, Potassium Chloride ER, albuterol, allopurinol, apixaban, buprenorphine, ezetimibe, furosemide, gabapentin, metoprolol succinate, multivitamin, nitroGLYCERIN, omeprazole, oxyCODONE-acetaminophen, oxybutynin, rosuvastatin, tiZANidine, and zolpidem  History Review  Allergy: Mr. Kiang has No Known Allergies. Drug: Mr. Laws  reports no history of drug use. Alcohol:  has no history on file for alcohol use. Tobacco:  reports that he has been smoking e-cigarettes. He has never used smokeless tobacco. Social: Mr. Villanueva  reports that he has been smoking e-cigarettes. He has never used smokeless  tobacco. He reports that he does not use drugs. Medical:  has a past medical history of Arthritis, Blood transfusion without reported diagnosis, CAD (coronary artery disease), Cataract, Chronic kidney disease, COPD  (chronic obstructive pulmonary disease) ([place]), GERD (gastroesophageal reflux disease), Hyperlipidemia, Hypertension, 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), Myocardial infarction ([place]), and Stroke ([place]). Surgical: Mr. Leser  has a past surgical history that includes Coronary artery bypass graft and Hernia repair. Family: family history is not on file.  Laboratory Chemistry Profile   Renal Lab Results  Component Value Date   BUN 22 07/20/2021   CREATININE 1.69 (H) 07/20/2021   BCR 13 07/20/2021    Hepatic Lab Results  Component Value Date   AST 30 07/20/2021   ALT 16 07/20/2021   ALBUMIN 4.4 07/20/2021   ALKPHOS 92 07/20/2021    Electrolytes Lab Results  Component Value Date   NA 141 07/20/2021   K 4.4 07/20/2021   CL 104 07/20/2021   CALCIUM 10.4 (H) 07/20/2021    Bone No results found for: "VD25OH", "VD125OH2TOT", "LN9892JJ9", "ER7408XK4", "25OHVITD1", "25OHVITD2", "25OHVITD3", "TESTOFREE", "TESTOSTERONE"  Inflammation (CRP: Acute Phase) (ESR: Chronic Phase) No results found for: "CRP", "ESRSEDRATE", "LATICACIDVEN"       Note: Above Lab results reviewed.  Sacroiliac Joint Imaging: Sacroiliac Joint DG: Results for orders placed during the hospital encounter of 12/07/21   DG Si Joints   Narrative CLINICAL DATA:  Sacroiliac joint pain. Chronic lower back pain radiating to the bilateral legs.   EXAM: BILATERAL SACROILIAC JOINTS - 3+ VIEW   COMPARISON:  None Available.   FINDINGS: There is diffuse decreased bone mineralization. Mild mid to superior bilateral sacroiliac subchondral sclerosis and joint space narrowing. No subchondral erosions.   Mild bilateral femoroacetabular joint space narrowing. Moderate to severe left L4-5 and L5-S1 disc space narrowing. Hernia mesh coils overlie the pelvis. No acute fracture or dislocation.   IMPRESSION: Mild bilateral sacroiliac osteoarthritis.     Electronically Signed By: Yvonne Kendall M.D. On: 12/08/2021 11:12         Narrative CLINICAL DATA:  Chronic bilateral hip pain.   EXAM: DG HIP (WITH OR WITHOUT PELVIS) 2-3V RIGHT; DG HIP (WITH OR WITHOUT PELVIS) 2-3V LEFT   COMPARISON:  None Available.   FINDINGS: Mild bilateral sacroiliac joint space narrowing and subchondral sclerosis. Mild bilateral superolateral acetabular degenerative osteophytes. Mild bilateral femoroacetabular joint space narrowing. The pubic symphysis joint space is maintained. Diffuse decreased bone mineralization. No acute fracture is seen. No dislocation. Mild-to-moderate vascular calcifications. Hernia mesh coils overlie the pelvis.   IMPRESSION: Mild bilateral femoroacetabular osteoarthritis.     Electronically Signed By: Yvonne Kendall M.D. On: 12/08/2021 11:24   Narrative CLINICAL DATA:  Chronic bilateral hip pain.   EXAM: DG HIP (WITH OR WITHOUT PELVIS) 2-3V RIGHT; DG HIP (WITH OR WITHOUT PELVIS) 2-3V LEFT   COMPARISON:  None Available.   FINDINGS: Mild bilateral sacroiliac joint space narrowing and subchondral sclerosis. Mild bilateral superolateral acetabular degenerative osteophytes. Mild bilateral femoroacetabular joint space narrowing. The pubic symphysis joint space is maintained. Diffuse decreased bone mineralization. No acute fracture is seen. No dislocation. Mild-to-moderate vascular calcifications. Hernia mesh coils overlie the pelvis.   IMPRESSION: Mild bilateral femoroacetabular osteoarthritis.     Electronically Signed By: Yvonne Kendall M.D. On: 12/08/2021 11:24    Physical Exam  General appearance: Well nourished, well developed, and well hydrated. In no apparent acute distress Mental status: Alert, oriented x 3 (person, place, & time)  Respiratory: No evidence of acute respiratory distress Eyes: PERLA Vitals: BP (!) 169/89   Pulse (!) 51   Temp 97.9 F (36.6 C) (Temporal)   Resp 14   Ht _0  (1.676 m)   Wt 130 lb (59 kg)   SpO2  99%   BMI 20.98 kg/m  BMI: Estimated body mass index is 20.98 kg/m as calculated from the following:   Height as of this encounter: _1  (1.676 m).   Weight as of this encounter: 130 lb (59 kg). Ideal: Ideal body weight: 63.8 kg (140 lb 10.5 oz)  Lumbar Spine Area Exam  Skin & Axial Inspection: No masses, redness, or swelling Alignment: Symmetrical Functional ROM: Pain restricted ROM       Stability: No instability detected Muscle Tone/Strength: Functionally intact. No obvious neuro-muscular anomalies detected. Sensory (Neurological): Dermatomal pain pattern and musculoskeletal Palpation: No palpable anomalies       Provocative Tests: Hyperextension/rotation test: deferred today       Lumbar quadrant test (Kemp's test): deferred today       Lateral bending test: deferred today       Patrick's Maneuver: (+) for bilateral S-I arthralgia             FABER* test: +) for bilateral S-I arthralgia             S-I anterior distraction/compression test: +) for bilateral S-I arthralgia             S-I lateral compression test: +) for bilateral S-I arthralgia             S-I Thigh-thrust test: +) for bilateral S-I arthralgia             S-I Gaenslen's test: +) for bilateral S-I arthralgia             *(Flexion, ABduction and External Rotation)   Gait & Posture Assessment  Ambulation: Unassisted Gait: Relatively normal for age and body habitus Posture: WNL  Lower Extremity Exam      Side: Right lower extremity   Side: Left lower extremity  Stability: No instability observed           Stability: No instability observed          Skin & Extremity Inspection: Skin color, temperature, and hair growth are WNL. No peripheral edema or cyanosis. No masses, redness, swelling, asymmetry, or associated skin lesions. No contractures.   Skin & Extremity Inspection: Skin color, temperature, and hair growth are WNL. No peripheral edema or cyanosis. No masses, redness, swelling, asymmetry, or associated skin  lesions. No contractures.  Functional ROM: Pain restricted ROM for hip and knee joints           Functional ROM: Pain restricted ROM for hip and knee joints          Muscle Tone/Strength: Functionally intact. No obvious neuro-muscular anomalies detected.   Muscle Tone/Strength: Functionally intact. No obvious neuro-muscular anomalies detected.  Sensory (Neurological): Musculoskeletal pain pattern         Sensory (Neurological): Musculoskeletal pain pattern        DTR: Patellar: deferred today Achilles: deferred today Plantar: deferred today   DTR: Patellar: deferred today Achilles: deferred today Plantar: deferred today  Palpation: No palpable anomalies   Palpation: No palpable anomalies     Assessment   Diagnosis Status  1. 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 Piriformis syndrome of both sides   3. Lumbar facet arthropathy   4. Other idiopathic  scoliosis, thoracolumbar region   5. Hx of shoulder surgery (left rotator cuff surgery, right rotator cuff then reverse total shoulder arthoplasty)   6. 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)   7. Chronic pain syndrome    Responding Responding Controlled   Plan of Care   Mr. Nesanel Aguila has a current medication list which includes the following long-term medication(s): albuterol, albuterol, allopurinol, apixaban, ezetimibe, furosemide, gabapentin, metoprolol succinate, nitroglycerin, omeprazole, potassium chloride er, rosuvastatin, and zolpidem.  Pharmacotherapy (Medications Ordered): Meds ordered this encounter  Medications   buprenorphine (BUTRANS) 20 MCG/HR PTWK    Sig: Place 1 patch onto the skin once a week.    Dispense:  4 patch    Refill:  2    Chronic Pain: STOP Act (Not applicable) Fill 1 day early if closed on refill date. Avoid benzodiazepines within 8 hours of opioids   oxyCODONE-acetaminophen (PERCOCET) 10-325 MG tablet    Sig: Take 1 tablet by mouth every 8 (eight) hours as  needed for pain. Must last 30 days.    Dispense:  90 tablet    Refill:  0    Chronic Pain: STOP Act (Not applicable) Fill 1 day early if closed on refill date. Avoid benzodiazepines within 8 hours of opioids   oxyCODONE-acetaminophen (PERCOCET) 10-325 MG tablet    Sig: Take 1 tablet by mouth every 8 (eight) hours as needed for pain. Must last 30 days.    Dispense:  90 tablet    Refill:  0    Chronic Pain: STOP Act (Not applicable) Fill 1 day early if closed on refill date. Avoid benzodiazepines within 8 hours of opioids   oxyCODONE-acetaminophen (PERCOCET) 10-325 MG tablet    Sig: Take 1 tablet by mouth every 8 (eight) hours as needed for pain. Must last 30 days.    Dispense:  90 tablet    Refill:  0    Chronic Pain: STOP Act (Not applicable) Fill 1 day early if closed on refill date. Avoid benzodiazepines within 8 hours of opioids   Orders:  No orders of the defined types were placed in this encounter.  Follow-up plan:   Return in about 3 months (around 07/05/2022) for Medication Management, in person.     B/L SI joint and piriformis 01/03/2022, 02/07/22   Recent Visits Date Type Provider Dept  03/08/22 Office Visit Gillis Santa, MD [facility]  02/07/22 Procedure visit Gillis Santa, MD [facility]  01/30/22 Office Visit Gillis Santa, MD [facility]  Showing recent visits within past 90 days and meeting all other requirements Today's Visits Date Type Provider Dept  04/04/22 Office Visit Gillis Santa, MD [facility]  Showing today's visits and meeting all other requirements Future Appointments Date Type Provider Dept  06/28/22 Appointment Gillis Santa, MD [facility]  Showing future appointments within next 90 days and meeting all other requirements  I discussed the assessment and treatment plan with the patient. The patient was provided an opportunity to ask questions and all were answered. The patient agreed with the plan and  demonstrated an understanding of the instructions.  Patient advised to call back or seek an in-person evaluation if the symptoms or condition worsens.  Duration of encounter: 39mnutes.  Total time on encounter, as per AMA guidelines included both the face-to-face and non-face-to-face time personally spent by the physician and/or other qualified health care professional(s) on the day of the encounter (includes time in activities that require the physician or other qualified health  care professional and does not include time in activities normally performed by clinical staff). Physician's time may include the following activities when performed: preparing to see the patient (eg, review of tests, pre-charting review of records) obtaining and/or reviewing separately obtained history performing a medically appropriate examination and/or evaluation counseling and educating the patient/family/caregiver ordering medications, tests, or procedures referring and communicating with other health care professionals (when not separately reported) documenting clinical information in the electronic or other health record independently interpreting results (not separately reported) and communicating results to the patient/ family/caregiver care coordination (not separately reported)  Note by: Gillis Santa, MD Date: 04/04/2022; Time: 2:56 PM

## 2022-04-04 NOTE — Progress Notes (Signed)
Nursing Pain Medication Assessment:  Safety precautions to be maintained throughout the outpatient stay will include: orient to surroundings, keep bed in low position, maintain call bell within reach at all times, provide assistance with transfer out of bed and ambulation.  Medication Inspection Compliance: Mr. Passero did not comply with our request to bring his pills to be counted. He was reminded that bringing the medication bottles, even when empty, is a requirement.  Medication: None brought in. Pill/Patch Count: None available to be counted. Bottle Appearance: No container available. Did not bring bottle(s) to appointment. Filled Date: N/A Last Medication intake:  Today Safety precautions to be maintained throughout the outpatient stay will include: orient to surroundings, keep bed in low position, maintain call bell within reach at all times, provide assistance with transfer out of bed and ambulation.

## 2022-04-09 ENCOUNTER — Other Ambulatory Visit: Payer: Self-pay | Admitting: Physician Assistant

## 2022-04-09 ENCOUNTER — Other Ambulatory Visit: Payer: Self-pay | Admitting: Student in an Organized Health Care Education/Training Program

## 2022-04-09 DIAGNOSIS — G4700 Insomnia, unspecified: Secondary | ICD-10-CM

## 2022-04-09 DIAGNOSIS — G629 Polyneuropathy, unspecified: Secondary | ICD-10-CM

## 2022-04-16 ENCOUNTER — Encounter (HOSPITAL_BASED_OUTPATIENT_CLINIC_OR_DEPARTMENT_OTHER): Payer: Self-pay | Admitting: Cardiology

## 2022-04-16 ENCOUNTER — Ambulatory Visit (HOSPITAL_BASED_OUTPATIENT_CLINIC_OR_DEPARTMENT_OTHER): Payer: Medicare PPO | Admitting: Cardiology

## 2022-04-16 VITALS — BP 138/72 | HR 52 | Ht 66.0 in | Wt 130.0 lb

## 2022-04-16 DIAGNOSIS — I5042 Chronic combined systolic (congestive) and diastolic (congestive) heart failure: Secondary | ICD-10-CM | POA: Diagnosis not present

## 2022-04-16 DIAGNOSIS — N1831 Chronic kidney disease, stage 3a: Secondary | ICD-10-CM

## 2022-04-16 DIAGNOSIS — I10 Essential (primary) hypertension: Secondary | ICD-10-CM

## 2022-04-16 DIAGNOSIS — I2510 Atherosclerotic heart disease of native coronary artery without angina pectoris: Secondary | ICD-10-CM

## 2022-04-16 DIAGNOSIS — I701 Atherosclerosis of renal artery: Secondary | ICD-10-CM

## 2022-04-16 DIAGNOSIS — I255 Ischemic cardiomyopathy: Secondary | ICD-10-CM | POA: Diagnosis not present

## 2022-04-16 DIAGNOSIS — I420 Dilated cardiomyopathy: Secondary | ICD-10-CM

## 2022-04-16 NOTE — Progress Notes (Signed)
Cardiology Office Note:    Date:  04/16/2022   ID:  [name], DOB 12/23/1950, MRN [id number]  PCP:  Lorrene Reid, PA-C  Cardiologist:  Buford Dresser, MD  Referring MD: Lorrene Reid, PA-C   CC: follow up  History of Present Illness:    Andrew Townsend is a 71 y.o. male with a hx of hypertension, hyperlipidemia, myocardial infarction, COPD, CKD, neuropathy, BPH, and gout, who is seen for follow up today. I met him 06/19/21 as a new consult at the request of Lorrene Reid, PA-C for the evaluation and management of atherosclerosis. Previously followed by cardiology in [place], [place].  Cardiovascular risk factors: Prior clinical ASCVD: MI s/p angioplasty. ICD in place. Has never felt his device activate. History of renal arterial stent. Comorbid conditions: Hypertension, Hyperlipidemia,  Saw Dr. Lovena Le, device noted increased fluid, improved with increased dose of diuretic. Started on apixaban for afib detected on his device. He is not sure if he has ever felt afib.   At his last visit, his BP was 178/85 in clinic but closer to 118/70 at home. His exercise continued to be limited by chronic back pain. Occasionally he had mild LE edema managed well with his prn furosemide. He complained of insomnia. Ambien was ineffective, so he was taking 2 mg Lunesta.   Today, he is feeling good overall. At home his blood pressures have been fine, so he has fallen out of the habit of checking routinely. Today's in clinic reading of 144/80 is a surprise to him. His BP improved to 138/72 on recheck.  Late in the evenings while watching TV he experiences double vision vertically. He has seen his ophthalmologist who has referred him to neurology.  He denies any palpitations, chest pain, shortness of breath, or peripheral edema. No lightheadedness, headaches, syncope, orthopnea, or PND.   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   albuterol (VENTOLIN HFA) 108 (90 Base) MCG/ACT inhaler Inhale 2 puffs into the lungs every 6 (six) hours as needed for wheezing or shortness of breath.   allopurinol (ZYLOPRIM) 100 MG tablet TAKE 1 TABLET (100 MG TOTAL) BY MOUTH DAILY.   apixaban (ELIQUIS) 2.5 MG TABS tablet Take 1 tablet (2.5 mg total) by mouth 2 (two) times daily.   buprenorphine (BUTRANS) 20 MCG/HR PTWK Place 1 patch onto the skin once a week.   ezetimibe (ZETIA) 10 MG tablet Take 1 tablet (10 mg total) by mouth daily.   Fluticasone-Umeclidin-Vilant (TRELEGY ELLIPTA) 100-62.5-25 MCG/ACT AEPB Inhale 1 puff into the lungs daily.   furosemide (LASIX) 40 MG tablet Take 1 tablet (40 mg total) by mouth daily as needed for fluid.   gabapentin (NEURONTIN) 300 MG capsule  Take 2 capsules (600 mg total) by mouth at bedtime.   Magnesium Oxide (DIASENSE MAGNESIUM PO) Take 800 mg by mouth daily at 6 (six) AM.   metoprolol succinate (TOPROL-XL) 100 MG 24 hr tablet Take 1.5 tablets (150 mg total) by mouth daily.   Multiple Vitamin (MULTIVITAMIN) tablet Take 1 tablet by mouth daily.   nitroGLYCERIN (NITROSTAT) 0.4 MG SL tablet Place 1 tablet (0.4 mg total) under the tongue every 5 (five) minutes as needed for chest pain.   omeprazole (PRILOSEC) 40 MG  capsule Take 1 capsule (40 mg total) by mouth daily.   oxybutynin (DITROPAN-XL) 10 MG 24 hr tablet Take 1 tablet (10 mg total) by mouth daily.   oxyCODONE-acetaminophen (PERCOCET) 10-325 MG tablet Take 1 tablet by mouth every 8 (eight) hours as needed for pain. Must last 30 days.   [START ON 05/04/2022] oxyCODONE-acetaminophen (PERCOCET) 10-325 MG tablet Take 1 tablet by mouth every 8 (eight) hours as needed for pain. Must last 30 days.   [START ON 06/03/2022] oxyCODONE-acetaminophen (PERCOCET) 10-325 MG tablet Take 1 tablet by mouth every 8 (eight) hours as needed for pain. Must last 30 days.   Potassium Chloride ER 20 MEQ TBCR Take 1 tablet by mouth daily.   rosuvastatin (CRESTOR) 40 MG tablet TAKE 1 TABLET (40 MG TOTAL) BY MOUTH AT BEDTIME.   tiZANidine (ZANAFLEX) 4 MG tablet Take 1 tablet (4 mg total) by mouth every 8 (eight) hours as needed.   zolpidem (AMBIEN) 5 MG tablet TAKE 1 TABLET (5 MG TOTAL) BY MOUTH AT BEDTIME AS NEEDED FOR SLEEP.   No current facility-administered medications on file prior to visit.     Allergies:   Patient has no known allergies.   Social History   Tobacco Use   Smoking status: Every Day    Types: E-cigarettes   Smokeless tobacco: Never  Vaping Use   Vaping Use: Some days  Substance Use Topics   Drug use: Never    Family History: No significant family history of heart disease  ROS:   Please see the history of present illness.   (+) Double vision Additional pertinent ROS otherwise unremarkable.  EKGs/Labs/Other Studies Reviewed:    The following studies were reviewed today:  CT Chest 05/17/2021: FINDINGS: Cardiovascular: Heart is upper limits of normal in size. No pericardial effusion. Three-vessel coronary artery calcifications status post CABG. Atherosclerotic disease of the thoracic aorta. 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   Mediastinum/Nodes: No pathologically enlarged lymph nodes seen in the chest.  Small hiatal hernia.   Lungs/Pleura: Central airways are patent. Bilateral bronchial wall thickening. Bilateral consolidations with adjacent nodularity, most pronounced in the right upper lobe. No pleural effusion or pneumothorax.   Upper Abdomen: No acute abnormality.   Musculoskeletal: Prior right shoulder arthroplasty.   IMPRESSION: 1. Bilateral consolidations with adjacent nodularity, most pronounced in the posterior right upper lobe, findings are likely sequela of infection or aspiration. Follow-up chest CT is recommended in 2-3 months to ensure resolution. 2. Bilateral bronchial wall thickening, findings can be seen in the setting of bronchitis. 3. Aortic Atherosclerosis (ICD10-I70.0) and Emphysema (ICD10-J43.9).  Echo 03/27/2021 ([place]): Left Ventricle:  The left ventricular chamber size is normal. There is no left  ventricular hypertrophy. There is mildly decreased left ventricular  systolic function. The estimated ejection fraction is 45-50%.  There is  abnormal ventricular septal wall motion consistent with right  ventricular pacemaker.  Abnormal left ventricular diastolic function is  observed.  Left Atrium:  The left atrial chamber size is normal.   Right Ventricle:  The right ventricular cavity size is normal. The right ventricular  global systolic function is normal. A pacemaker wire is visualized in  the right ventricle.   Right Atrium:  The right atrial cavity size is normal.   Aortic Valve:  The aortic valve is trileaflet. There is no evidence of aortic stenosis.  There is no evidence of aortic regurgitation.   Mitral Valve:  The mitral valve leaflets appear normal. There is no evidence of mitral  stenosis. There is a trace of mitral regurgitation.   Tricuspid Valve:  There is a trace tricuspid regurgitation.     Pulmonic Valve:  The pulmonic valve appears normal in structure and function.   Pericardium:  The pericardium  appears normal.   Aorta:  The aorta appears normal.     Pulmonary Artery:  The main pulmonary artery appears normal.   Conclusions:                Mildly depressed LV function.  Maybe inferior base hypokinesis but not  seen well.  No significant valve disease.   Cardiac Catheterization 08/05/2020 ([facility]): Findings:  --LM: Distal LM has a 95% stenosis  --LAD: The proximal LAD has diffuse moderate-severe disease. The mid LAD is 100% occluded. The LIMA-LAD is patent. The distal LAD has mild diffuse disease. The SVG-diagonal is known to be occluded and was not injected.  --LCx: The proximal LCx is 100% occluded. The SVG-OM is widely patent. The distal OM territory it serves has only minimal luminal disease.  --RCA: Not engaged. Known to be 100% occluded in the proximal segment. The SVG-PDA is patent but has TIMI II flow. There are R-L collaterals.   Intervention: none   EKG:  EKG is personally reviewed.   04/16/2022:  sinus bradycardia at 52 bpm, IVCB (LBBB pattern) 01/09/2022:  EKG was not ordered. 06/19/2021: NSR, IVCB at 69 bpm  Recent Labs: 05/23/2021: BNP 124.6; Hemoglobin 12.4; Platelets 291; TSH 4.020 07/20/2021: ALT 16; BUN 22; Creatinine, Ser 1.69; Potassium 4.4; Sodium 141   Recent Lipid Panel No results found for: "CHOL", "TRIG", "HDL", "CHOLHDL", "VLDL", "LDLCALC", "LDLDIRECT"  Physical Exam:    VS:  BP 138/72 (BP Location: Left Arm, Patient Position: Sitting, Cuff Size: Normal)   Pulse (!) 52   Ht _0  (1.676 m)   Wt 130 lb (59 kg)   SpO2 96%   BMI 20.98 kg/m     Wt Readings from Last 3 Encounters:  04/16/22 130 lb (59 kg)  04/04/22 130 lb (59 kg)  02/07/22 126 lb (57.2 kg)    GEN: Well nourished, well developed in no acute distress HEENT: Normal, moist mucous membranes NECK: No JVD CARDIAC: regular rhythm, normal S1 and S2, no rubs or gallops. No murmur. VASCULAR: Radial and DP pulses 2+ bilaterally. No carotid bruits RESPIRATORY:   Clear to auscultation without rales, wheezing or rhonchi  ABDOMEN: Soft, non-tender, non-distended MUSCULOSKELETAL:  Ambulates independently SKIN: Warm and dry, no edema NEUROLOGIC:  Alert and oriented x 3. No focal neuro deficits noted. PSYCHIATRIC:  Normal affect    ASSESSMENT:    1. Primary hypertension   2. Coronary artery disease involving native coronary artery of native heart without angina pectoris   3. Ischemic dilated cardiomyopathy ([place])   4. Chronic combined systolic and diastolic congestive heart failure (HCC)   5. Stage 3a chronic kidney disease ([place])   6. Renal artery stenosis (  [place])     PLAN:    CAD with prior 4V CABG Ischemic cardiomyopathy Chronic systolic and diastolic heart failure -appears euvolemic today -has lasix 40 mg PRN -continue metoprolol succinate 150 mg daily -now on apixaban, so no aspirin -on ezetimibe, rosuvastatin -Not currently seeing nephrologist  Stage 3a chronic kidney disease Renal artery stenosis, right, s/p right renal artery stent Hypertension -last Cr 1.62 -previously borderline low BP and amlodipine stopped due to edema. Reports excellent control at home. Monitor home BP, if >130/80 consistently he will contact me  History of VT, EP study 06/28/2016 S/P ICD Paroxysmal atrial fibrillation -followed by Dr. Lovena Le -continue metoprolol succinate, taking 150 mg daily CHA2DS2/VAS Stroke Risk Points =3 -on apixaban, reduce dose based on body weight and Cr  Cardiac risk counseling and prevention recommendations: -recommend heart healthy/Mediterranean diet, with whole grains, fruits, vegetable, fish, lean meats, nuts, and olive oil. Limit salt. -recommend moderate walking, 3-5 times/week for 30-50 minutes each session. Aim for at least 150 minutes.week. Goal should be pace of 3 miles/hours, or walking 1.5 miles in 30 minutes -recommend avoidance of tobacco products. Avoid excess alcohol.  Plan for follow up: 6 months or sooner as  needed.  Buford Dresser, MD, PhD, [facility]    Medication Adjustments/Labs and Tests Ordered: Current medicines are reviewed at length with the patient today.  Concerns regarding medicines are outlined above.   Orders Placed This Encounter  Procedures   EKG 12-Lead   No orders of the defined types were placed in this encounter.  Patient Instructions  Medication Instructions:  The current medical regimen is effective;  continue present plan and medications.  *If you need a refill on your cardiac medications before your next appointment, please call your pharmacy*   Lab Work: None   Testing/Procedures: None   Follow-Up: At [facility], you and your health needs are our priority.  As part of our continuing mission to provide you with exceptional heart care, we have created designated Provider Care Teams.  These Care Teams include your primary Cardiologist (physician) and Advanced Practice Providers (APPs -  Physician Assistants and Nurse Practitioners) who all work together to provide you with the care you need, when you need it.  We recommend signing up for the patient portal called "MyChart".  Sign up information is provided on this After Visit Summary.  MyChart is used to connect with patients for Virtual Visits (Telemedicine).  Patients are able to view lab/test results, encounter notes, upcoming appointments, etc.  Non-urgent messages can be sent to your provider as well.   To learn more about what you can do with MyChart, go to [email or web address].    Your next appointment:   6 month(s)  The format for your next appointment:   In Person  Provider:   Buford Dresser, MD    Other Instructions None           I,Mathew Stumpf,acting as a scribe for Buford Dresser, MD.,have documented all relevant documentation on the behalf of Buford Dresser, MD,as directed by  Buford Dresser, MD while in the  presence of Buford Dresser, MD.  I, Buford Dresser, MD, have reviewed all documentation for this visit. The documentation on 04/16/22 for the exam, diagnosis, procedures, and orders are all accurate and complete.   Signed, Buford Dresser, MD PhD 04/16/2022     [place]

## 2022-04-16 NOTE — Patient Instructions (Signed)
Medication Instructions:  The current medical regimen is effective;  continue present plan and medications.   *If you need a refill on your cardiac medications before your next appointment, please call your pharmacy*   Lab Work: None   Testing/Procedures: None   Follow-Up: At [facility], you and your health needs are our priority.  As part of our continuing mission to provide you with exceptional heart care, we have created designated Provider Care Teams.  These Care Teams include your primary Cardiologist (physician) and Advanced Practice Providers (APPs -  Physician Assistants and Nurse Practitioners) who all work together to provide you with the care you need, when you need it.  We recommend signing up for the patient portal called "MyChart".  Sign up information is provided on this After Visit Summary.  MyChart is used to connect with patients for Virtual Visits (Telemedicine).  Patients are able to view lab/test results, encounter notes, upcoming appointments, etc.  Non-urgent messages can be sent to your provider as well.   To learn more about what you can do with MyChart, go to [email or web address].    Your next appointment:   6 month(s)  The format for your next appointment:   In Person  Provider:   Bridgette Christopher, MD    Other Instructions None       

## 2022-05-04 ENCOUNTER — Other Ambulatory Visit: Payer: Self-pay | Admitting: Nurse Practitioner

## 2022-05-04 ENCOUNTER — Telehealth: Payer: Self-pay

## 2022-05-04 DIAGNOSIS — U071 COVID-19: Secondary | ICD-10-CM

## 2022-05-04 MED ORDER — MOLNUPIRAVIR EUA 200MG CAPSULE: 4.0000 | ORAL_CAPSULE | Freq: Two times a day (BID) | ORAL | 0 refills | Status: AC

## 2022-05-04 NOTE — Telephone Encounter (Signed)
Called pt and advised PCP recommendations and sent the information to Mychart for patient to be sure to know what to get.  FYI

## 2022-05-04 NOTE — Telephone Encounter (Signed)
Pt was calling to see if there was anything that could be prescribed to him with him having Hx of COPD.  Tested POS 05/03/22.  SXS Sore throat, coughing up mucus, runny nose, body aches.  Please advise

## 2022-05-04 NOTE — Telephone Encounter (Signed)
Please let him know that I sent molnupirivir to [place] [facility]. He will have to take 4 capsules twice daily for next 5 days. He should also take OTC zinc, vitamin d, and vitamin c every day. Can also use otc medications as needed and as indicated for symptom management. Rest and drink plenty of fluids  Thanks so much.   -HB

## 2022-05-09 ENCOUNTER — Other Ambulatory Visit: Payer: Self-pay | Admitting: Nurse Practitioner

## 2022-05-09 DIAGNOSIS — Z79899 Other long term (current) drug therapy: Secondary | ICD-10-CM

## 2022-05-09 DIAGNOSIS — I10 Essential (primary) hypertension: Secondary | ICD-10-CM

## 2022-05-10 ENCOUNTER — Ambulatory Visit: Payer: Medicare PPO | Admitting: Diagnostic Neuroimaging

## 2022-05-22 ENCOUNTER — Other Ambulatory Visit: Payer: Self-pay | Admitting: Nurse Practitioner

## 2022-05-22 DIAGNOSIS — K219 Gastro-esophageal reflux disease without esophagitis: Secondary | ICD-10-CM

## 2022-05-24 ENCOUNTER — Telehealth: Payer: Self-pay | Admitting: Diagnostic Neuroimaging

## 2022-05-24 NOTE — Telephone Encounter (Signed)
LVM and sent mychart msg informing pt of r/s needed for 1/29 appt- MD out.

## 2022-06-08 ENCOUNTER — Other Ambulatory Visit: Payer: Self-pay | Admitting: Nurse Practitioner

## 2022-06-08 DIAGNOSIS — G4700 Insomnia, unspecified: Secondary | ICD-10-CM

## 2022-06-11 ENCOUNTER — Other Ambulatory Visit: Payer: Self-pay | Admitting: Nurse Practitioner

## 2022-06-11 DIAGNOSIS — Z0000 Encounter for general adult medical examination without abnormal findings: Secondary | ICD-10-CM

## 2022-06-11 DIAGNOSIS — E785 Hyperlipidemia, unspecified: Secondary | ICD-10-CM

## 2022-06-11 DIAGNOSIS — N1831 Chronic kidney disease, stage 3a: Secondary | ICD-10-CM

## 2022-06-11 DIAGNOSIS — I10 Essential (primary) hypertension: Secondary | ICD-10-CM

## 2022-06-12 ENCOUNTER — Ambulatory Visit: Payer: Medicare PPO | Attending: Internal Medicine

## 2022-06-12 ENCOUNTER — Other Ambulatory Visit: Payer: Self-pay | Admitting: Nurse Practitioner

## 2022-06-12 DIAGNOSIS — I420 Dilated cardiomyopathy: Secondary | ICD-10-CM

## 2022-06-12 DIAGNOSIS — I255 Ischemic cardiomyopathy: Secondary | ICD-10-CM | POA: Diagnosis not present

## 2022-06-12 DIAGNOSIS — E785 Hyperlipidemia, unspecified: Secondary | ICD-10-CM

## 2022-06-12 LAB — CUP PACEART REMOTE DEVICE CHECK
Battery Remaining Longevity: 48 mo
Battery Voltage: 2.98 V
Brady Statistic RV Percent Paced: 1.58 %
Date Time Interrogation Session: 20240123093212
HighPow Impedance: 64 Ohm
Implantable Lead Connection Status: 753985
Implantable Lead Implant Date: 20180209
Implantable Lead Location: 753860
Implantable Lead Model: 6935
Implantable Pulse Generator Implant Date: 20180209
Lead Channel Impedance Value: 304 Ohm
Lead Channel Impedance Value: 342 Ohm
Lead Channel Sensing Intrinsic Amplitude: 2 mV
Lead Channel Sensing Intrinsic Amplitude: 2 mV
Lead Channel Setting Pacing Amplitude: 2 V
Lead Channel Setting Pacing Pulse Width: 0.5 ms
Lead Channel Setting Sensing Sensitivity: 0.3 mV
Zone Setting Status: 755011

## 2022-06-18 ENCOUNTER — Ambulatory Visit: Payer: Medicare PPO | Admitting: Diagnostic Neuroimaging

## 2022-06-26 ENCOUNTER — Encounter: Payer: Self-pay | Admitting: Neurology

## 2022-06-26 ENCOUNTER — Other Ambulatory Visit: Payer: Self-pay | Admitting: Nurse Practitioner

## 2022-06-26 ENCOUNTER — Ambulatory Visit: Payer: Medicare PPO | Admitting: Neurology

## 2022-06-26 ENCOUNTER — Other Ambulatory Visit: Payer: Self-pay | Admitting: Urology

## 2022-06-26 VITALS — BP 155/66 | HR 53 | Ht 66.0 in | Wt 126.0 lb

## 2022-06-26 DIAGNOSIS — I255 Ischemic cardiomyopathy: Secondary | ICD-10-CM | POA: Diagnosis not present

## 2022-06-26 DIAGNOSIS — E785 Hyperlipidemia, unspecified: Secondary | ICD-10-CM

## 2022-06-26 DIAGNOSIS — K219 Gastro-esophageal reflux disease without esophagitis: Secondary | ICD-10-CM

## 2022-06-26 DIAGNOSIS — Z95810 Presence of automatic (implantable) cardiac defibrillator: Secondary | ICD-10-CM

## 2022-06-26 DIAGNOSIS — H532 Diplopia: Secondary | ICD-10-CM | POA: Insufficient documentation

## 2022-06-26 DIAGNOSIS — I2510 Atherosclerotic heart disease of native coronary artery without angina pectoris: Secondary | ICD-10-CM | POA: Diagnosis not present

## 2022-06-26 NOTE — Progress Notes (Signed)
[facility]  PATIENT: Andrew Townsend DOB: 12/21/1950  REFERRING DOCTOR OR PCP: . Melissa Noon, OD; Lorrene Reid PA-C SOURCE: Patient, notes from optometry, lab reports.  _________________________________   HISTORICAL  CHIEF COMPLAINT:  Chief Complaint  Patient presents with   Room 11    Pt is here Alone. Pt states that he is seeing double. Pt states that he doesn't have any nausea with the double vision. Pt states that he doesn't have any pain.     HISTORY OF PRESENT ILLNESS:  I had the pleasure of seeing your patient, Andrew Townsend, at [facility] for a neurologic consultation regarding his diplopia.    He is a 72 year old man who noted vertical diplopia starting in late 2023.  He would note the disturbance while watching TV.  This was a binocular diplopia and covering either eye would clear the image.   He generally notes symptoms more later in the day.     He has mild right ptosis that seems worse at times.    Either eye sees well by itself.     He saw Dr. Delman Cheadle of optometry.  Monocular vision with correction was 20/20.  Extraocular muscles seem full but he had diplopia in upgaze..2 BD Fresnel lenses on the right corrected this.  Funduscopic examination was normal.  He has a h/o CAD.  He has had 2 transient neurologic events.   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  This lasted 1/2 our and he was fine by the time he was in the ED.    He also felt strange at the time.  Two years ago, while in [employer], he had disorientation and the police were called.   He was taken home and was fine after a total of 1 hour.  He still has some memory of the event and recalls being asked questions.   He was referred to Dr. Graylon Good there.    No abnormality was found and he was told he had a TIA.  He is on Eliquis for ischemic cardiomyopathy.  He has an implantable cardioverter defibrillator.  He also has chronic systolic CHF and CAD.  He  has an ICD [facility] [id number]    SN: [id number] H   Model [id number]   SN [id number] V)   He denies DM or thyroid disease.  No head trauma.    REVIEW OF SYSTEMS: Constitutional: No fevers, chills, sweats, or change in appetite Eyes: No visual changes, double vision, eye pain Ear, nose and throat: No hearing loss, ear pain, nasal congestion, sore throat Cardiovascular: No chest pain, palpitations    Has defibriallator.   Respiratory:  No shortness of breath at rest or with exertion.   No wheezes GastrointestinaI: No nausea, vomiting, diarrhea, abdominal pain, fecal incontinence Genitourinary:  No dysuria, urinary retention or frequency.  No nocturia. Musculoskeletal:  Has LBP Integumentary: No rash, pruritus, skin lesions Neurological: as above Psychiatric: No depression at this time.  No anxiety Endocrine: No palpitations, diaphoresis, change in appetite, change in weigh or increased thirst Hematologic/Lymphatic:  No anemia, purpura, petechiae. Allergic/Immunologic: No itchy/runny eyes, nasal congestion, recent allergic reactions, rashes  ALLERGIES: No Known Allergies  HOME MEDICATIONS:  Current Outpatient Medications:    albuterol (PROVENTIL) (2.5 MG/3ML) 0.083% nebulizer solution, INHALE THE CONTENTS OF 1 VIAL VIA NEBULIZER EVERY 6 HOURS AS NEEDED FOR WHEEZING OR SHORTNESS OF BREATH., Disp: 150 mL, Rfl: 0   albuterol (VENTOLIN HFA) 108 (90  Base) MCG/ACT inhaler, Inhale 2 puffs into the lungs every 6 (six) hours as needed for wheezing or shortness of breath., Disp: 8 g, Rfl: 2   allopurinol (ZYLOPRIM) 100 MG tablet, TAKE 1 TABLET (100 MG TOTAL) BY MOUTH DAILY., Disp: 30 tablet, Rfl: 0   apixaban (ELIQUIS) 2.5 MG TABS tablet, Take 1 tablet (2.5 mg total) by mouth 2 (two) times daily., Disp: 60 tablet, Rfl: 11   buprenorphine (BUTRANS) 20 MCG/HR PTWK, Place 1 patch onto the skin once a week., Disp: 4 patch, Rfl: 2   ezetimibe (ZETIA) 10 MG tablet, TAKE 1 TABLET (10 MG TOTAL) BY MOUTH  DAILY., Disp: 90 tablet, Rfl: 1   Fluticasone-Umeclidin-Vilant (TRELEGY ELLIPTA) 100-62.5-25 MCG/ACT AEPB, Inhale 1 puff into the lungs daily., Disp: 1 each, Rfl: 11   gabapentin (NEURONTIN) 300 MG capsule, Take 2 capsules (600 mg total) by mouth at bedtime., Disp: 60 capsule, Rfl: 2   Magnesium Oxide (DIASENSE MAGNESIUM PO), Take 800 mg by mouth daily at 6 (six) AM., Disp: , Rfl:    metoprolol succinate (TOPROL-XL) 100 MG 24 hr tablet, TAKE 1 AND 1/2 TABLETS (150 MG TOTAL) BY MOUTH DAILY., Disp: 135 tablet, Rfl: 1   Multiple Vitamin (MULTIVITAMIN) tablet, Take 1 tablet by mouth daily., Disp: , Rfl:    nitroGLYCERIN (NITROSTAT) 0.4 MG SL tablet, Place 1 tablet (0.4 mg total) under the tongue every 5 (five) minutes as needed for chest pain., Disp: 30 tablet, Rfl: 1   omeprazole (PRILOSEC) 40 MG capsule, TAKE 1 CAPSULE (40 MG TOTAL) BY MOUTH DAILY. NEED OFFICE VISIT FOR REFILLS, Disp: 30 capsule, Rfl: 0   oxybutynin (DITROPAN-XL) 10 MG 24 hr tablet, Take 1 tablet (10 mg total) by mouth daily., Disp: 30 tablet, Rfl: 11   potassium chloride SA (KLOR-CON M) 20 MEQ tablet, TAKE 1 TABLET BY MOUTH DAILY., Disp: 90 tablet, Rfl: 1   rosuvastatin (CRESTOR) 40 MG tablet, TAKE 1 TABLET (40 MG TOTAL) BY MOUTH AT BEDTIME., Disp: 90 tablet, Rfl: 0   tiZANidine (ZANAFLEX) 4 MG tablet, Take 1 tablet (4 mg total) by mouth every 8 (eight) hours as needed., Disp: 90 tablet, Rfl: 5   zolpidem (AMBIEN) 5 MG tablet, TAKE 1 TABLET (5 MG TOTAL) BY MOUTH AT BEDTIME AS NEEDED FOR SLEEP., Disp: 30 tablet, Rfl: 1   furosemide (LASIX) 40 MG tablet, Take 1 tablet (40 mg total) by mouth daily as needed for fluid., Disp: 90 tablet, Rfl: 3   oxyCODONE-acetaminophen (PERCOCET) 10-325 MG tablet, Take 1 tablet by mouth every 8 (eight) hours as needed for pain. Must last 30 days., Disp: 90 tablet, Rfl: 0  PAST MEDICAL HISTORY: Past Medical History:  Diagnosis Date   Arthritis    Blood transfusion without reported diagnosis    CAD  (coronary artery disease)    Cataract    Chronic kidney disease    COPD (chronic obstructive pulmonary disease) (HCC)    GERD (gastroesophageal reflux disease)    Hyperlipidemia    Hypertension    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)    Myocardial infarction (HCC)    Stroke (HCC)     PAST SURGICAL HISTORY: Past Surgical History:  Procedure Laterality Date   CORONARY ARTERY BYPASS GRAFT     HERNIA REPAIR      FAMILY HISTORY: History reviewed. No pertinent family history.  SOCIAL HISTORY: Social History   Socioeconomic History   Marital status: Widowed    Spouse name: Not on file   Number of children: Not on  file   Years of education: Not on file   Highest education level: Not on file  Occupational History   Not on file  Tobacco Use   Smoking status: Every Day    Types: E-cigarettes   Smokeless tobacco: Never  Vaping Use   Vaping Use: Some days  Substance and Sexual Activity   Alcohol use: Not on file   Drug use: Never   Sexual activity: Not Currently  Other Topics Concern   Not on file  Social History Narrative   Not on file   Social Determinants of Health   Financial Resource Strain: Not on file  Food Insecurity: Not on file  Transportation Needs: Not on file  Physical Activity: Not on file  Stress: Not on file  Social Connections: Not on file  Intimate Partner Violence: Not on file       PHYSICAL EXAM  Vitals:   06/26/22 1421  BP: (!) 155/66  Pulse: (!) 53  Weight: 126 lb (57.2 kg)  Height: 5\' 6"  (1.676 m)    Body mass index is 20.34 kg/m.   General: The patient is well-developed and well-nourished and in no acute distress  HEENT:  Head is [place]/AT.  Sclera are anicteric.  Funduscopic exam shows normal optic discs and retinal vessels.  Neck: No carotid bruits are noted.  The neck is nontender. With good ROM  Cardiovascular: The heart has a regular rate and rhythm with a normal S1 and S2. There were no  murmurs, gallops or rubs.    Skin: Extremities are without rash or  edema.  Musculoskeletal:  Back is mildly tender with reduced ROM  Neurologic Exam  Mental status: The patient is alert and oriented x 3 at the time of the examination. The patient has apparent normal recent and remote memory, with an apparently normal attention span and concentration ability.   Speech is normal.  Cranial nerves: Extraocular movements seem full but with red lines he had separation on lateral gaze to the right and upgaze.  Head tilt slightly change the diplopia.    . Pupils are equal, round, and reactive to light and accomodation.  Visual fields are full.  Facial symmetry is present. There is good facial sensation to soft touch bilaterally.Facial strength is normal.  Trapezius and sternocleidomastoid strength is normal. No dysarthria is noted.  The tongue is midline, and the patient has symmetric elevation of the soft palate. No obvious hearing deficits are noted.  Motor:  Muscle bulk is normal.   Tone is normal. Strength is  5 / 5 in all 4 extremities.   Sensory: Sensory testing is intact to pinprick, soft touch and vibration sensation in all 4 extremities.  Coordination: Cerebellar testing reveals good finger-nose-finger and heel-to-shin bilaterally.  Gait and station: Station is normal.   Gait is normal. Tandem gait is normal. Romberg is negative.   Reflexes: Deep tendon reflexes are symmetric and normal bilaterally.        DIAGNOSTIC DATA (LABS, IMAGING, TESTING) - I reviewed patient records, labs, notes, testing and imaging myself where available.  Lab Results  Component Value Date   WBC 10.6 05/23/2021   HGB 12.4 (L) 05/23/2021   HCT 39.3 05/23/2021   MCV 88 05/23/2021   PLT 291 05/23/2021      Component Value Date/Time   NA 141 07/20/2021 1111   K 4.4 07/20/2021 1111   CL 104 07/20/2021 1111   CO2 16 (L) 07/20/2021 1111   GLUCOSE 94 07/20/2021 1111  BUN 22 07/20/2021 1111    CREATININE 1.69 (H) 07/20/2021 1111   CALCIUM 10.4 (H) 07/20/2021 1111   PROT 6.9 07/20/2021 1111   ALBUMIN 4.4 07/20/2021 1111   AST 30 07/20/2021 1111   ALT 16 07/20/2021 1111   ALKPHOS 92 07/20/2021 1111   BILITOT 0.7 07/20/2021 1111    Lab Results  Component Value Date   TSH 4.020 05/23/2021       ASSESSMENT AND PLAN  Diplopia - Plan: Sedimentation rate, C-reactive protein, Myasthenia Gravis Profile, Thyroid Panel With TSH  Coronary artery disease involving native coronary artery of native heart without angina pectoris  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)   In summary, Mr. 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  Symptoms are mild at this time.  He does not feel that the symptoms are worsening.  We discussed getting an MRI of the brain and orbits.  However, he has an ICD.  Although it is theoretically MRI cleared, he has been unable to get an MRI when it had been ordered in the past.  I would consider doing the imaging if symptoms worsen if cleared by cardiology.  We will check blood work for vasculitis, myasthenia gravis and thyroid disease.  He will return to see me as needed if new or worsening symptoms.  We discussed the prism glasses.  Has symptoms only bother him when he watches TV he does not think he would use them at this time but would consider if symptoms worsen.    Ahmaad Neidhardt A. Felecia Shelling, MD, [facility] 07/20/5174, 1:60 PM Certified in Neurology, Clinical Neurophysiology, Sleep Medicine and Neuroimaging  [facility] [address], [place], [place] [address] [phone]

## 2022-06-28 ENCOUNTER — Ambulatory Visit
Payer: Medicare PPO | Attending: Student in an Organized Health Care Education/Training Program | Admitting: Student in an Organized Health Care Education/Training Program

## 2022-06-28 ENCOUNTER — Encounter: Payer: Self-pay | Admitting: Student in an Organized Health Care Education/Training Program

## 2022-06-28 VITALS — BP 140/75 | HR 60 | Temp 97.4°F | Ht 66.0 in | Wt 126.0 lb

## 2022-06-28 DIAGNOSIS — G5703 Lesion of sciatic nerve, bilateral lower limbs: Secondary | ICD-10-CM | POA: Diagnosis not present

## 2022-06-28 DIAGNOSIS — Z79891 Long term (current) use of opiate analgesic: Secondary | ICD-10-CM | POA: Diagnosis present

## 2022-06-28 DIAGNOSIS — G629 Polyneuropathy, unspecified: Secondary | ICD-10-CM

## 2022-06-28 DIAGNOSIS — M4125 Other idiopathic scoliosis, thoracolumbar region: Secondary | ICD-10-CM | POA: Diagnosis present

## 2022-06-28 DIAGNOSIS — M47818 Spondylosis without myelopathy or radiculopathy, sacral and sacrococcygeal region: Secondary | ICD-10-CM | POA: Insufficient documentation

## 2022-06-28 DIAGNOSIS — M47816 Spondylosis without myelopathy or radiculopathy, lumbar region: Secondary | ICD-10-CM

## 2022-06-28 DIAGNOSIS — G894 Chronic pain syndrome: Secondary | ICD-10-CM

## 2022-06-28 DIAGNOSIS — M461 Sacroiliitis, not elsewhere classified: Secondary | ICD-10-CM

## 2022-06-28 MED ORDER — OXYCODONE-ACETAMINOPHEN 10-325 MG PO TABS: 1.0000 | ORAL_TABLET | Freq: Three times a day (TID) | ORAL | 0 refills | Status: DC | PRN

## 2022-06-28 MED ORDER — OXYCODONE-ACETAMINOPHEN 10-325 MG PO TABS: 1.0000 | ORAL_TABLET | Freq: Three times a day (TID) | ORAL | 0 refills | Status: AC | PRN

## 2022-06-28 MED ORDER — TIZANIDINE HCL 4 MG PO TABS: 4.0000 mg | ORAL_TABLET | Freq: Three times a day (TID) | ORAL | 5 refills | Status: DC | PRN

## 2022-06-28 MED ORDER — BUPRENORPHINE 20 MCG/HR TD PTWK: 1.0000 | MEDICATED_PATCH | TRANSDERMAL | 2 refills | Status: DC

## 2022-06-28 MED ORDER — GABAPENTIN 300 MG PO CAPS: 600.0000 mg | ORAL_CAPSULE | Freq: Every day | ORAL | 5 refills | Status: DC

## 2022-06-28 NOTE — Progress Notes (Signed)
PROVIDER NOTE: Information contained herein reflects review and annotations entered in association with encounter. Interpretation of such information and data should be left to medically-trained personnel. Information provided to patient can be located elsewhere in the medical record under "Patient Instructions". Document created using STT-dictation technology, any transcriptional errors that may result from process are unintentional.    Patient: Andrew Townsend  Service Category: E/M  Provider: Edward Jolly, MD  DOB: 04-28-1951  DOS: 06/28/2022  Referring Provider: Peggye Fothergill  MRN: [id number]  Specialty: Interventional Pain Management  PCP: Mayer Masker, PA-C  Type: Established Patient  Setting: Ambulatory outpatient    Location: Office  Delivery: Face-to-face     HPI  Andrew Townsend, a 72 y.o. 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 Pain Assessment: Severity of Chronic pain is reported as a 7 /10. Location: Back Left, Right/pain radiaities down both leg to his feet. Onset: More than a month ago. Quality: Aching, Burning, Constant, Throbbing. Timing: Constant. Modifying factor(s): Meds and heating pad. Vitals:  height is 5\' 6"  (1.676 m) and weight is 126 lb (57.2 kg). His temperature is 97.4 F (36.3 C)  (abnormal). His blood pressure is 140/75 (abnormal) and his Townsend is 60. His oxygen saturation is 98%.   Reason for encounter: medication management.  No significant change in Andrew Townsend's medical history since his last visit with me. Patient's pain is at baseline.  Patient continues multimodal pain regimen as prescribed.  States that it provides pain relief and improvement in functional status.    HPI from initial clinic note: Andrew Townsend is a pleasant 72 year old male who has moved from [facility] hoping to establish with pain management.  Patient has a history of bilateral shoulder pain status post left rotator cuff surgery x1, right rotator cuff surgery x2, low back pain related to lumbar spinal stenosis and lumbar degenerative disc disease and lumbar facet arthropathy.  He has been on chronic opioid therapy which includes Butrans patch at 15 mcg an hour along with Percocet 10 mg 3 times daily as needed.  He also takes gabapentin 300 mg nightly which she is hoping to increase the dose of.  He has difficulty ambulating.  He states that he has to sit down and rest given weakness and pain in bilateral legs.  He also has a history of coronary artery disease status post CABG x4.  He has a ICD in place along with COPD, stage IV chronic kidney disease for which she needs to establish with nephrologist.  He has a right renal artery stent in place.  He is complaining of bilateral hip pain, SI joint pain as well as bilateral knee pain.  He is on Eliquis for his cardiac disease.  Of note, he has had many injections done in the past at his previous pain clinic including diagnostic lumbar facet medial branch nerve blocks, subsequent lumbar radiofrequency ablations with limited response.  He is also had multiple epidurals with  limited response as well.  He is open to interventional options for pain management.    Pharmacotherapy Assessment  Analgesic: Butrans patch, 20 mcg an hour, Percocet 10 mg 3 times daily  as needed  Monitoring: [name] PMP: PDMP not reviewed this encounter.       Pharmacotherapy: No side-effects or adverse reactions reported. Compliance: No problems identified. Effectiveness: Clinically acceptable.  Andrew Pulse, RN  06/28/2022 12:55 PM  Sign when Signing Visit Nursing Pain Medication Assessment:  Safety precautions to be maintained throughout the outpatient stay will include: orient to surroundings, keep bed in low position, maintain call bell within reach at all times, provide assistance with transfer out of bed and ambulation.  Medication Inspection Compliance: Pill count conducted under aseptic conditions, in front of the patient. Neither the pills nor the bottle was removed from the patient's sight at any time. Once count was completed pills were immediately returned to the patient in their original bottle.  Medication: Buprenorphine (Suboxone) Pill/Patch Count:  0 of 4 pills remain Pill/Patch Appearance: Markings consistent with prescribed medication Bottle Appearance: Standard pharmacy container. Clearly labeled. Filled Date: 1 / 15 / 2024 Last Medication intake:  TodaySafety precautions to be maintained throughout the outpatient stay will include: orient to surroundings, keep bed in   Medication: Oxycodone/APAP Pill/Patch Count:  17 of 90 pills remain Pill/Patch Appearance: Markings consistent with prescribed medication Bottle Appearance: Standard pharmacy container. Clearly labeled. Filled Date: 1 / 15 / 2023 Last Medication intake:  Todayh transfer out of bed and ambulation.     No results found for: "CBDTHCR" No results found for: "D8THCCBX" No results found for: "D9THCCBX"  UDS:  Summary  Date Value Ref Range Status  12/07/2021 Note  Final    Comment:    ==================================================================== Compliance Drug Analysis, Ur ==================================================================== Test                              Result       Flag       Units  Drug Present and Declared for Prescription Verification   Oxycodone                      6752         EXPECTED   ng/mg creat   Oxymorphone                    1668         EXPECTED   ng/mg creat   Noroxycodone                   6775         EXPECTED   ng/mg creat   Noroxymorphone                 719          EXPECTED   ng/mg creat    Sources of oxycodone are scheduled prescription medications.    Oxymorphone, noroxycodone, and noroxymorphone are expected    metabolites of oxycodone. Oxymorphone is also available as a    scheduled prescription medication.    Buprenorphine                  7            EXPECTED   ng/mg creat   Norbuprenorphine               9  EXPECTED   ng/mg creat    Source of buprenorphine is a scheduled prescription medication.    Norbuprenorphine is an expected metabolite of buprenorphine.    Gabapentin                     PRESENT      EXPECTED   Tizanidine                     PRESENT      EXPECTED   Zopiclone/Eszopiclone          PRESENT      EXPECTED    Eszopiclone is detected as zopiclone (racemic).    Acetaminophen                  PRESENT      EXPECTED   Metoprolol                     PRESENT      EXPECTED  Drug Present not Declared for Prescription Verification   Ibuprofen                      PRESENT      UNEXPECTED ==================================================================== Test                      Result    Flag   Units      Ref Range   Creatinine              75               mg/dL      >=50 ==================================================================== Declared Medications:  The flagging and interpretation on this report are based on the  following declared medications.  Unexpected results may arise from  inaccuracies in the declared medications.   **Note: The testing scope of this panel includes these medications:   Gabapentin (Neurontin)  Metoprolol (Toprol)  Oxycodone (Percocet)    **Note: The testing scope of this panel does not include small to  moderate amounts of these reported medications:   Acetaminophen (Percocet)  Buprenorphine Patch (BuTrans)  Eszopiclone (Lunesta)  Tizanidine (Zanaflex)   **Note: The testing scope of this panel does not include the  following reported medications:   Albuterol (Ventolin HFA)  Allopurinol (Zyloprim)  Apixaban (Eliquis)  Ezetimibe (Zetia)  Fluticasone (Trelegy)  Furosemide (Lasix)  Nitroglycerin (Nitrostat)  Omeprazole (Prilosec)  Oxybutynin (Ditropan)  Potassium Chloride  Rosuvastatin (Crestor)  Umeclidinium (Trelegy)  Vilanterol (Trelegy) ==================================================================== For clinical consultation, please call [phone]. ====================================================================       ROS  Constitutional: Denies any fever or chills Gastrointestinal: No reported hemesis, hematochezia, vomiting, or acute GI distress Musculoskeletal:  Low back, bilateral SI joint and piriformis pain Neurological: No reported episodes of acute onset apraxia, aphasia, dysarthria, agnosia, amnesia, paralysis, loss of coordination, or loss of consciousness  Medication Review  Fluticasone-Umeclidin-Vilant, Magnesium Oxide, albuterol, allopurinol, apixaban, buprenorphine, ezetimibe, furosemide, gabapentin, metoprolol succinate, multivitamin, nitroGLYCERIN, omeprazole, oxyCODONE-acetaminophen, oxybutynin, potassium chloride SA, rosuvastatin, tiZANidine, and zolpidem  History Review  Allergy: Mr. Pandit has No Known Allergies. Drug: Mr. Sossamon  reports no history of drug use. Alcohol:  has no history on file for alcohol use. Tobacco:  reports that he has been smoking e-cigarettes. He has never used smokeless tobacco. Social: Mr. Kimberley  reports that he has been smoking e-cigarettes. He has never used smokeless tobacco. He reports that he does not use drugs. Medical:  has  a past medical  history of Arthritis, Blood transfusion without reported diagnosis, CAD (coronary artery disease), Cataract, Chronic kidney disease, COPD (chronic obstructive pulmonary disease) ([place]), GERD (gastroesophageal reflux disease), Hyperlipidemia, Hypertension, 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), Myocardial infarction ([place]), and Stroke ([place]). Surgical: Mr. Garciamartinez  has a past surgical history that includes Coronary artery bypass graft and Hernia repair. Family: family history is not on file.  Laboratory Chemistry Profile   Renal Lab Results  Component Value Date   BUN 22 07/20/2021   CREATININE 1.69 (H) 07/20/2021   BCR 13 07/20/2021    Hepatic Lab Results  Component Value Date   AST 30 07/20/2021   ALT 16 07/20/2021   ALBUMIN 4.4 07/20/2021   ALKPHOS 92 07/20/2021    Electrolytes Lab Results  Component Value Date   NA 141 07/20/2021   K 4.4 07/20/2021   CL 104 07/20/2021   CALCIUM 10.4 (H) 07/20/2021    Bone No results found for: "VD25OH", "VD125OH2TOT", "WN0272ZD6", "UY4034VQ2", "25OHVITD1", "25OHVITD2", "25OHVITD3", "TESTOFREE", "TESTOSTERONE"  Inflammation (CRP: Acute Phase) (ESR: Chronic Phase) Lab Results  Component Value Date   CRP 18 (H) 06/26/2022   ESRSEDRATE 6 06/26/2022         Note: Above Lab results reviewed.  Sacroiliac Joint Imaging: Sacroiliac Joint DG: Results for orders placed during the hospital encounter of 12/07/21   DG Si Joints   Narrative CLINICAL DATA:  Sacroiliac joint pain. Chronic lower back pain radiating to the bilateral legs.   EXAM: BILATERAL SACROILIAC JOINTS - 3+ VIEW   COMPARISON:  None Available.   FINDINGS: There is diffuse decreased bone mineralization. Mild mid to superior bilateral sacroiliac subchondral sclerosis and joint space narrowing. No subchondral erosions.   Mild bilateral femoroacetabular joint space narrowing. Moderate to severe left L4-5 and L5-S1 disc space narrowing. Hernia  mesh coils overlie the pelvis. No acute fracture or dislocation.   IMPRESSION: Mild bilateral sacroiliac osteoarthritis.     Electronically Signed By: Yvonne Kendall M.D. On: 12/08/2021 11:12         Narrative CLINICAL DATA:  Chronic bilateral hip pain.   EXAM: DG HIP (WITH OR WITHOUT PELVIS) 2-3V RIGHT; DG HIP (WITH OR WITHOUT PELVIS) 2-3V LEFT   COMPARISON:  None Available.   FINDINGS: Mild bilateral sacroiliac joint space narrowing and subchondral sclerosis. Mild bilateral superolateral acetabular degenerative osteophytes. Mild bilateral femoroacetabular joint space narrowing. The pubic symphysis joint space is maintained. Diffuse decreased bone mineralization. No acute fracture is seen. No dislocation. Mild-to-moderate vascular calcifications. Hernia mesh coils overlie the pelvis.   IMPRESSION: Mild bilateral femoroacetabular osteoarthritis.     Electronically Signed By: Yvonne Kendall M.D. On: 12/08/2021 11:24   Narrative CLINICAL DATA:  Chronic bilateral hip pain.   EXAM: DG HIP (WITH OR WITHOUT PELVIS) 2-3V RIGHT; DG HIP (WITH OR WITHOUT PELVIS) 2-3V LEFT   COMPARISON:  None Available.   FINDINGS: Mild bilateral sacroiliac joint space narrowing and subchondral sclerosis. Mild bilateral superolateral acetabular degenerative osteophytes. Mild bilateral femoroacetabular joint space narrowing. The pubic symphysis joint space is maintained. Diffuse decreased bone mineralization. No acute fracture is seen. No dislocation. Mild-to-moderate vascular calcifications. Hernia mesh coils overlie the pelvis.   IMPRESSION: Mild bilateral femoroacetabular osteoarthritis.     Electronically Signed By: Yvonne Kendall M.D. On: 12/08/2021 11:24    Physical Exam  General appearance: Well nourished, well developed, and well hydrated. In no apparent acute distress Mental status: Alert, oriented x 3 (person, place, & time)  Respiratory: No evidence of acute  respiratory distress Eyes: PERLA Vitals: BP (!) 140/75   Townsend 60   Temp (!) 97.4 F (36.3 C)   Ht 5\' 6"  (1.676 m)   Wt 126 lb (57.2 kg)   SpO2 98%   BMI 20.34 kg/m  BMI: Estimated body mass index is 20.34 kg/m as calculated from the following:   Height as of this encounter: 5\' 6"  (1.676 m).   Weight as of this encounter: 126 lb (57.2 kg). Ideal: Ideal body weight: 63.8 kg (140 lb 10.5 oz)  Lumbar Spine Area Exam  Skin & Axial Inspection: No masses, redness, or swelling Alignment: Symmetrical Functional ROM: Pain restricted ROM       Stability: No instability detected Muscle Tone/Strength: Functionally intact. No obvious neuro-muscular anomalies detected. Sensory (Neurological): Dermatomal pain pattern and musculoskeletal Palpation: No palpable anomalies       Provocative Tests: Hyperextension/rotation test: deferred today       Lumbar quadrant test (Kemp's test): deferred today       Lateral bending test: deferred today       Patrick's Maneuver: (+) for bilateral S-I arthralgia             FABER* test: +) for bilateral S-I arthralgia             S-I anterior distraction/compression test: +) for bilateral S-I arthralgia             S-I lateral compression test: +) for bilateral S-I arthralgia             S-I Thigh-thrust test: +) for bilateral S-I arthralgia             S-I Gaenslen's test: +) for bilateral S-I arthralgia             *(Flexion, ABduction and External Rotation)   Gait & Posture Assessment  Ambulation: Unassisted Gait: Relatively normal for age and body habitus Posture: WNL  Lower Extremity Exam      Side: Right lower extremity   Side: Left lower extremity  Stability: No instability observed           Stability: No instability observed          Skin & Extremity Inspection: Skin color, temperature, and hair growth are WNL. No peripheral edema or cyanosis. No masses, redness, swelling, asymmetry, or associated skin lesions. No contractures.   Skin & Extremity  Inspection: Skin color, temperature, and hair growth are WNL. No peripheral edema or cyanosis. No masses, redness, swelling, asymmetry, or associated skin lesions. No contractures.  Functional ROM: Pain restricted ROM for hip and knee joints           Functional ROM: Pain restricted ROM for hip and knee joints          Muscle Tone/Strength: Functionally intact. No obvious neuro-muscular anomalies detected.   Muscle Tone/Strength: Functionally intact. No obvious neuro-muscular anomalies detected.  Sensory (Neurological): Musculoskeletal pain pattern         Sensory (Neurological): Musculoskeletal pain pattern        DTR: Patellar: deferred today Achilles: deferred today Plantar: deferred today   DTR: Patellar: deferred today Achilles: deferred today Plantar: deferred today  Palpation: No palpable anomalies   Palpation: No palpable anomalies     Assessment   Diagnosis Status  1. 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 Neuropathy   3. Piriformis syndrome of both sides   4. Lumbar facet arthropathy   5. Other idiopathic scoliosis,  thoracolumbar region   6. Encounter for long-term opiate analgesic use   7. Chronic pain syndrome     Controlled Controlled Controlled   Plan of Care   Mr. Mathews Stuhr has a current medication list which includes the following long-term medication(s): albuterol, albuterol, allopurinol, apixaban, ezetimibe, metoprolol succinate, nitroglycerin, omeprazole, potassium chloride sa, rosuvastatin, zolpidem, furosemide, and gabapentin.  Pharmacotherapy (Medications Ordered): Meds ordered this encounter  Medications   oxyCODONE-acetaminophen (PERCOCET) 10-325 MG tablet    Sig: Take 1 tablet by mouth every 8 (eight) hours as needed for pain.    Dispense:  90 tablet    Refill:  0   oxyCODONE-acetaminophen (PERCOCET) 10-325 MG tablet    Sig: Take 1 tablet by mouth every 8 (eight) hours as needed for pain.    Dispense:  90 tablet    Refill:  0    oxyCODONE-acetaminophen (PERCOCET) 10-325 MG tablet    Sig: Take 1 tablet by mouth every 8 (eight) hours as needed for pain.    Dispense:  90 tablet    Refill:  0   buprenorphine (BUTRANS) 20 MCG/HR PTWK    Sig: Place 1 patch onto the skin once a week.    Dispense:  4 patch    Refill:  2   gabapentin (NEURONTIN) 300 MG capsule    Sig: Take 2 capsules (600 mg total) by mouth at bedtime.    Dispense:  60 capsule    Refill:  5   tiZANidine (ZANAFLEX) 4 MG tablet    Sig: Take 1 tablet (4 mg total) by mouth every 8 (eight) hours as needed.    Dispense:  90 tablet    Refill:  5   Orders:  No orders of the defined types were placed in this encounter.  Follow-up plan:   Return in about 3 months (around 09/26/2022) for Medication Management, in person.     B/L SI joint and piriformis 01/03/2022, 02/07/22   Recent Visits Date Type Provider Dept  04/04/22 Office Visit Edward Jolly, MD [facility]  Showing recent visits within past 90 days and meeting all other requirements Today's Visits Date Type Provider Dept  06/28/22 Office Visit Edward Jolly, MD [facility]  Showing today's visits and meeting all other requirements Future Appointments Date Type Provider Dept  09/18/22 Appointment Edward Jolly, MD [facility]  Showing future appointments within next 90 days and meeting all other requirements  I discussed the assessment and treatment plan with the patient. The patient was provided an opportunity to ask questions and all were answered. The patient agreed with the plan and demonstrated an understanding of the instructions.  Patient advised to call back or seek an in-person evaluation if the symptoms or condition worsens.  Duration of encounter: .  Total time on encounter, as per AMA guidelines included both the face-to-face and non-face-to-face time personally spent by the physician and/or other qualified health care professional(s) on the day of  the encounter (includes time in activities that require the physician or other qualified health care professional and does not include time in activities normally performed by clinical staff). Physician's time may include the following activities when performed: preparing to see the patient (eg, review of tests, pre-charting review of records) obtaining and/or reviewing separately obtained history performing a medically appropriate examination and/or evaluation counseling and educating the patient/family/caregiver ordering medications, tests, or procedures referring and communicating with other health care professionals (when not separately reported) documenting clinical information in the electronic or other  health record independently interpreting results (not separately reported) and communicating results to the patient/ family/caregiver care coordination (not separately reported)  Note by: Gillis Santa, MD Date: 06/28/2022; Time: 1:51 PM

## 2022-06-28 NOTE — Progress Notes (Signed)
Nursing Pain Medication Assessment:  Safety precautions to be maintained throughout the outpatient stay will include: orient to surroundings, keep bed in low position, maintain call bell within reach at all times, provide assistance with transfer out of bed and ambulation.  Medication Inspection Compliance: Pill count conducted under aseptic conditions, in front of the patient. Neither the pills nor the bottle was removed from the patient's sight at any time. Once count was completed pills were immediately returned to the patient in their original bottle.  Medication: Buprenorphine (Suboxone) Pill/Patch Count:  0 of 4 pills remain Pill/Patch Appearance: Markings consistent with prescribed medication Bottle Appearance: Standard pharmacy container. Clearly labeled. Filled Date: 1 / 15 / 2024 Last Medication intake:  TodaySafety precautions to be maintained throughout the outpatient stay will include: orient to surroundings, keep bed in   Medication: Oxycodone/APAP Pill/Patch Count:  17 of 90 pills remain Pill/Patch Appearance: Markings consistent with prescribed medication Bottle Appearance: Standard pharmacy container. Clearly labeled. Filled Date: 1 / 15 / 2023 Last Medication intake:  [place] transfer out of bed and ambulation.

## 2022-07-04 ENCOUNTER — Other Ambulatory Visit: Payer: Medicare PPO

## 2022-07-04 DIAGNOSIS — Z0000 Encounter for general adult medical examination without abnormal findings: Secondary | ICD-10-CM

## 2022-07-04 DIAGNOSIS — E785 Hyperlipidemia, unspecified: Secondary | ICD-10-CM

## 2022-07-04 DIAGNOSIS — N1831 Chronic kidney disease, stage 3a: Secondary | ICD-10-CM

## 2022-07-04 DIAGNOSIS — N1832 Chronic kidney disease, stage 3b: Secondary | ICD-10-CM

## 2022-07-04 DIAGNOSIS — I10 Essential (primary) hypertension: Secondary | ICD-10-CM

## 2022-07-04 NOTE — Addendum Note (Signed)
Addended by: Vivia Birmingham on: 07/04/2022 09:06 AM   Modules accepted: Orders

## 2022-07-05 LAB — LIPID PANEL
Chol/HDL Ratio: 2.6 ratio (ref 0.0–5.0)
Cholesterol, Total: 113 mg/dL (ref 100–199)
HDL: 44 mg/dL (ref >39)
LDL Chol Calc (NIH): 55 mg/dL (ref 0–99)
Triglycerides: 63 mg/dL (ref 0–149)
VLDL Cholesterol Cal: 14 mg/dL (ref 5–40)

## 2022-07-05 LAB — COMPREHENSIVE METABOLIC PANEL
ALT: 21 IU/L (ref 0–44)
AST: 24 IU/L (ref 0–40)
Albumin/Globulin Ratio: 2.6 — ABNORMAL HIGH (ref 1.2–2.2)
Albumin: 4.7 g/dL (ref 3.8–4.8)
Alkaline Phosphatase: 95 IU/L (ref 44–121)
BUN/Creatinine Ratio: 15 (ref 10–24)
BUN: 22 mg/dL (ref 8–27)
Bilirubin Total: 0.8 mg/dL (ref 0.0–1.2)
CO2: 21 mmol/L (ref 20–29)
Calcium: 9.4 mg/dL (ref 8.6–10.2)
Chloride: 100 mmol/L (ref 96–106)
Creatinine, Ser: 1.5 mg/dL — ABNORMAL HIGH (ref 0.76–1.27)
Globulin, Total: 1.8 g/dL (ref 1.5–4.5)
Glucose: 114 mg/dL — ABNORMAL HIGH (ref 70–99)
Potassium: 5.1 mmol/L (ref 3.5–5.2)
Sodium: 139 mmol/L (ref 134–144)
Total Protein: 6.5 g/dL (ref 6.0–8.5)
eGFR: 49 mL/min/{1.73_m2} — ABNORMAL LOW (ref >59)

## 2022-07-05 LAB — URINALYSIS
Bilirubin, UA: NEGATIVE
Glucose, UA: NEGATIVE
Ketones, UA: NEGATIVE
Leukocytes,UA: NEGATIVE
Nitrite, UA: NEGATIVE
RBC, UA: NEGATIVE
Specific Gravity, UA: 1.016 (ref 1.005–1.030)
Urobilinogen, Ur: 0.2 mg/dL (ref 0.2–1.0)
pH, UA: 6 (ref 5.0–7.5)

## 2022-07-05 LAB — CBC WITH DIFFERENTIAL/PLATELET
Basophils Absolute: 0.1 10*3/uL (ref 0.0–0.2)
Basos: 2 %
EOS (ABSOLUTE): 0.1 10*3/uL (ref 0.0–0.4)
Eos: 1 %
Hematocrit: 38.5 % (ref 37.5–51.0)
Hemoglobin: 12.9 g/dL — ABNORMAL LOW (ref 13.0–17.7)
Immature Grans (Abs): 0 10*3/uL (ref 0.0–0.1)
Immature Granulocytes: 0 %
Lymphocytes Absolute: 1.7 10*3/uL (ref 0.7–3.1)
Lymphs: 21 %
MCH: 30.1 pg (ref 26.6–33.0)
MCHC: 33.5 g/dL (ref 31.5–35.7)
MCV: 90 fL (ref 79–97)
Monocytes Absolute: 0.8 10*3/uL (ref 0.1–0.9)
Monocytes: 10 %
Neutrophils Absolute: 5.4 10*3/uL (ref 1.4–7.0)
Neutrophils: 66 %
Platelets: 171 10*3/uL (ref 150–450)
RBC: 4.28 x10E6/uL (ref 4.14–5.80)
RDW: 14 % (ref 11.6–15.4)
WBC: 8.2 10*3/uL (ref 3.4–10.8)

## 2022-07-05 LAB — HEMOGLOBIN A1C
Est. average glucose Bld gHb Est-mCnc: 108 mg/dL
Hgb A1c MFr Bld: 5.4 % (ref 4.8–5.6)

## 2022-07-05 LAB — PROTEIN / CREATININE RATIO, URINE
Creatinine, Urine: 72.8 mg/dL
Protein, Ur: 13.4 mg/dL
Protein/Creat Ratio: 184 mg/g creat (ref 0–200)

## 2022-07-05 LAB — PARATHYROID HORMONE, INTACT (NO CA): PTH: 30 pg/mL (ref 15–65)

## 2022-07-05 LAB — TSH: TSH: 3.05 u[IU]/mL (ref 0.450–4.500)

## 2022-07-09 ENCOUNTER — Ambulatory Visit (INDEPENDENT_AMBULATORY_CARE_PROVIDER_SITE_OTHER): Payer: Medicare PPO | Admitting: Nurse Practitioner

## 2022-07-09 ENCOUNTER — Encounter: Payer: Self-pay | Admitting: Nurse Practitioner

## 2022-07-09 VITALS — BP 139/70 | HR 63 | Ht 66.0 in | Wt 121.0 lb

## 2022-07-09 DIAGNOSIS — I10 Essential (primary) hypertension: Secondary | ICD-10-CM

## 2022-07-09 DIAGNOSIS — G4700 Insomnia, unspecified: Secondary | ICD-10-CM

## 2022-07-09 DIAGNOSIS — K219 Gastro-esophageal reflux disease without esophagitis: Secondary | ICD-10-CM

## 2022-07-09 DIAGNOSIS — J449 Chronic obstructive pulmonary disease, unspecified: Secondary | ICD-10-CM | POA: Diagnosis not present

## 2022-07-09 DIAGNOSIS — E785 Hyperlipidemia, unspecified: Secondary | ICD-10-CM

## 2022-07-09 DIAGNOSIS — Z0000 Encounter for general adult medical examination without abnormal findings: Secondary | ICD-10-CM | POA: Diagnosis not present

## 2022-07-09 DIAGNOSIS — M1A39X0 Chronic gout due to renal impairment, multiple sites, without tophus (tophi): Secondary | ICD-10-CM

## 2022-07-09 MED ORDER — OMEPRAZOLE 40 MG PO CPDR: DELAYED_RELEASE_CAPSULE | ORAL | 1 refills | Status: DC

## 2022-07-09 MED ORDER — ZOLPIDEM TARTRATE 5 MG PO TABS: 5.0000 mg | ORAL_TABLET | Freq: Every evening | ORAL | 1 refills | Status: DC | PRN

## 2022-07-09 MED ORDER — ROSUVASTATIN CALCIUM 40 MG PO TABS: 40.0000 mg | ORAL_TABLET | Freq: Every day | ORAL | 1 refills | Status: DC

## 2022-07-09 MED ORDER — ALBUTEROL SULFATE (2.5 MG/3ML) 0.083% IN NEBU: INHALATION_SOLUTION | RESPIRATORY_TRACT | 1 refills | Status: AC

## 2022-07-09 MED ORDER — METOPROLOL SUCCINATE ER 100 MG PO TB24: ORAL_TABLET | ORAL | 1 refills | Status: DC

## 2022-07-09 MED ORDER — TRELEGY ELLIPTA 100-62.5-25 MCG/ACT IN AEPB: 1.0000 | INHALATION_SPRAY | Freq: Every day | RESPIRATORY_TRACT | 1 refills | Status: DC

## 2022-07-09 MED ORDER — ALLOPURINOL 100 MG PO TABS: 100.0000 mg | ORAL_TABLET | Freq: Every day | ORAL | 1 refills | Status: DC

## 2022-07-09 MED ORDER — EZETIMIBE 10 MG PO TABS: 10.0000 mg | ORAL_TABLET | Freq: Every day | ORAL | 1 refills | Status: DC

## 2022-07-09 MED ORDER — ALBUTEROL SULFATE HFA 108 (90 BASE) MCG/ACT IN AERS: 2.0000 | INHALATION_SPRAY | Freq: Four times a day (QID) | RESPIRATORY_TRACT | 1 refills | Status: DC | PRN

## 2022-07-09 NOTE — Progress Notes (Signed)
**Note Andrew-Identified via Obfuscation** Subjective:   Andrew Townsend is a 72 y.o. male who presents for Medicare Annual/Subsequent preventive examination. -history of COPD --was well managed on Trelegy -insomnia --did trial of lunesta which was not as effective as [place] and went back to [place]  -GERD --currently on prilosec  -due for colon cancer screening  -recently had routine fasting labs done --mildly low Hgb at 12.9  --elevated renal functions.  --other labs essentially normal  -He denies chest pain, chest pressure, or shortness of breath. He denies headaches or visual disturbances. He denies abdominal pain, nausea, vomiting, or changes in bowel or bladder habits.    Review of Systems    See HPI       Objective:    Today's Vitals   07/09/22 1344 07/09/22 1408  BP: 139/70   Pulse: 63   SpO2: 97%   Weight: 121 lb (54.9 kg)   Height: 5' 6"$  (1.676 m)   PainSc:  7    Body mass index is 19.53 kg/m.    Row Labels 07/09/2022    2:14 PM 04/04/2022    1:59 PM 01/30/2022    8:47 AM 01/03/2022   11:05 AM 12/07/2021   10:14 AM 09/13/2021    2:36 PM  Advanced Directives   Section Header. No data exists in this row.        Does Patient Have a Medical Advance Directive?   Yes Yes Yes No No Yes  Type of Scientist, physiological of [place];Living will  Living will   [place];Living will  Does patient want to make changes to medical advance directive?   No - Patient declined     No - Patient declined  Would patient like information on creating a medical advance directive?      No - Patient declined Yes (MAU/Ambulatory/Procedural Areas - Information given)     Current Medications (verified) Outpatient Encounter Medications as of 07/09/2022  Medication Sig   apixaban (ELIQUIS) 2.5 MG TABS tablet Take 1 tablet (2.5 mg total) by mouth 2 (two) times daily.   buprenorphine (BUTRANS) 20 MCG/HR PTWK Place 1 patch onto the skin once a week.   gabapentin (NEURONTIN) 300 MG capsule Take 2 capsules  (600 mg total) by mouth at bedtime.   Magnesium Oxide (DIASENSE MAGNESIUM PO) Take 800 mg by mouth daily at 6 (six) AM.   Multiple Vitamin (MULTIVITAMIN) tablet Take 1 tablet by mouth daily.   nitroGLYCERIN (NITROSTAT) 0.4 MG SL tablet Place 1 tablet (0.4 mg total) under the tongue every 5 (five) minutes as needed for chest pain.   oxybutynin (DITROPAN-XL) 10 MG 24 hr tablet TAKE 1 TABLET (10 MG TOTAL) BY MOUTH DAILY.   oxyCODONE-acetaminophen (PERCOCET) 10-325 MG tablet Take 1 tablet by mouth every 8 (eight) hours as needed for pain.   [START ON 08/03/2022] oxyCODONE-acetaminophen (PERCOCET) 10-325 MG tablet Take 1 tablet by mouth every 8 (eight) hours as needed for pain.   [START ON 09/02/2022] oxyCODONE-acetaminophen (PERCOCET) 10-325 MG tablet Take 1 tablet by mouth every 8 (eight) hours as needed for pain.   potassium chloride SA (KLOR-CON M) 20 MEQ tablet TAKE 1 TABLET BY MOUTH DAILY.   tiZANidine (ZANAFLEX) 4 MG tablet Take 1 tablet (4 mg total) by mouth every 8 (eight) hours as needed.   [DISCONTINUED] albuterol (PROVENTIL) (2.5 MG/3ML) 0.083% nebulizer solution INHALE THE CONTENTS OF 1 VIAL VIA NEBULIZER EVERY 6 HOURS AS NEEDED FOR WHEEZING OR SHORTNESS OF BREATH.   [DISCONTINUED] albuterol (VENTOLIN  HFA) 108 (90 Base) MCG/ACT inhaler Inhale 2 puffs into the lungs every 6 (six) hours as needed for wheezing or shortness of breath.   [DISCONTINUED] allopurinol (ZYLOPRIM) 100 MG tablet TAKE 1 TABLET (100 MG TOTAL) BY MOUTH DAILY.   [DISCONTINUED] ezetimibe (ZETIA) 10 MG tablet TAKE 1 TABLET (10 MG TOTAL) BY MOUTH DAILY.   [DISCONTINUED] Fluticasone-Umeclidin-Vilant (TRELEGY ELLIPTA) 100-62.5-25 MCG/ACT AEPB Inhale 1 puff into the lungs daily.   [DISCONTINUED] metoprolol succinate (TOPROL-XL) 100 MG 24 hr tablet TAKE 1 AND 1/2 TABLETS (150 MG TOTAL) BY MOUTH DAILY.   [DISCONTINUED] omeprazole (PRILOSEC) 40 MG capsule TAKE 1 CAPSULE (40 MG TOTAL) BY MOUTH DAILY. NEED OFFICE VISIT FOR REFILLS    [DISCONTINUED] rosuvastatin (CRESTOR) 40 MG tablet TAKE 1 TABLET (40 MG TOTAL) BY MOUTH AT BEDTIME.   [DISCONTINUED] zolpidem (AMBIEN) 5 MG tablet TAKE 1 TABLET (5 MG TOTAL) BY MOUTH AT BEDTIME AS NEEDED FOR SLEEP.   albuterol (PROVENTIL) (2.5 MG/3ML) 0.083% nebulizer solution INHALE THE CONTENTS OF 1 VIAL VIA NEBULIZER EVERY 6 HOURS AS NEEDED FOR WHEEZING OR SHORTNESS OF BREATH.   albuterol (VENTOLIN HFA) 108 (90 Base) MCG/ACT inhaler Inhale 2 puffs into the lungs every 6 (six) hours as needed for wheezing or shortness of breath.   allopurinol (ZYLOPRIM) 100 MG tablet Take 1 tablet (100 mg total) by mouth daily.   ezetimibe (ZETIA) 10 MG tablet Take 1 tablet (10 mg total) by mouth daily.   Fluticasone-Umeclidin-Vilant (TRELEGY ELLIPTA) 100-62.5-25 MCG/ACT AEPB Inhale 1 puff into the lungs daily.   furosemide (LASIX) 40 MG tablet Take 1 tablet (40 mg total) by mouth daily as needed for fluid.   metoprolol succinate (TOPROL-XL) 100 MG 24 hr tablet TAKE 1 AND 1/2 TABLETS (150 MG TOTAL) BY MOUTH DAILY.   omeprazole (PRILOSEC) 40 MG capsule TAKE 1 CAPSULE (40 MG TOTAL) BY MOUTH DAILY. NEED OFFICE VISIT FOR REFILLS   rosuvastatin (CRESTOR) 40 MG tablet Take 1 tablet (40 mg total) by mouth at bedtime.   zolpidem (AMBIEN) 5 MG tablet Take 1 tablet (5 mg total) by mouth at bedtime as needed for sleep.   No facility-administered encounter medications on file as of 07/09/2022.    Allergies (verified) Patient has no known allergies.   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 No pertinent family history. Social History   Socioeconomic History   Marital status: Widowed    Spouse name: Not on file   Number of children: Not on file   Years of education: Not on file   Highest education level: Not on file  Occupational History   Not on file  Tobacco Use   Smoking status: Every Day    Types: E-cigarettes   Smokeless tobacco: Never  Vaping Use   Vaping Use: Some days  Substance and Sexual Activity   Alcohol use: Not on file   Drug use: Never   Sexual activity: Not Currently  Other Topics Concern   Not on file  Social History Narrative   Not on file   Social Determinants of Health   Financial Resource Strain: Medium Risk (07/09/2022)   Overall Financial Resource Strain (CARDIA)    Difficulty of Paying Living Expenses: Somewhat hard  Food Insecurity: No Food Insecurity (07/09/2022)   Hunger Vital Sign    Worried About Running Out of Food in the Last Year: Never true    Ran Out of Food in the Last Year: Never true  Transportation Needs: No Transportation Needs (07/09/2022)   PRAPARE - Hydrologist (Medical): No    Lack of Transportation (Non-Medical): No  Physical Activity: Sufficiently Active (07/09/2022)   Exercise Vital Sign    Days of Exercise per Week: 7 days    Minutes of Exercise per Session: 40 min  Stress: Stress Concern Present (07/09/2022)   [place]    Feeling of Stress : To some extent  Social Connections: Moderately Isolated (07/09/2022)   Social Connection and Isolation Panel [NHANES]    Frequency of Communication with Friends and Family: More than three times a week    Frequency of Social Gatherings with Friends and Family: More than three times a week    Attends Religious Services: 1 to 4 times per year    Active Member of [employer] or  Organizations: No    Attends Archivist Meetings: Never    Marital Status: Widowed    Tobacco Counseling Ready to quit: Not Answered Counseling given: Not Answered   Clinical Intake:  Pre-visit preparation completed: Yes  Pain : 0-10 Pain Score: 7  Pain Type: Chronic pain Pain Location: Back Pain Descriptors / Indicators: Sharp Pain Onset:  (on going for years) Pain Frequency: Constant     Diabetes: No  How often do you need to have someone help you when you read instructions, pamphlets, or other written materials from your doctor or pharmacy?: 1 - Never What is the last grade level you completed in school?: 12 th  Diabetic?no  Interpreter Needed?: No      Activities of Daily Living   Row Labels 07/09/2022    1:52 PM 02/07/2022    1:18 PM  In your present state of health, do you have any difficulty performing the following activities:   Section Header. No data exists in this row.    Hearing?   1 0  Vision?   0 0  Difficulty concentrating or making decisions?   0 0  Walking or climbing stairs?   1 1  Dressing or bathing?   0 0  Doing errands, shopping?   0 0    Patient Care Team: Velva Harman, PA as PCP - General (Family Medicine) Buford Dresser, MD as PCP - Cardiology (Cardiology)  Indicate any recent Medical Services you may have received from other than [facility] providers in the past year (date may be approximate).     Assessment:   1. Encounter for Medicare annual wellness exam Annual medicare wellness visit today   2. Chronic obstructive pulmonary disease, unspecified COPD type ([place]) Continue trelegy daily. May use ventolin inhaler as needed and as prescribed. May also use albuterol nebulizer treatments as needed and as prescribed for exacerbation of wheezing and shortness of breath.  - albuterol (PROVENTIL) (2.5 MG/3ML) 0.083% nebulizer solution; INHALE THE CONTENTS OF 1 VIAL VIA NEBULIZER EVERY 6 HOURS AS NEEDED FOR WHEEZING OR  SHORTNESS OF  BREATH.  Dispense: 150 mL; Refill: 1 - albuterol (VENTOLIN HFA) 108 (90 Base) MCG/ACT inhaler; Inhale 2 puffs into the lungs every 6 (six) hours as needed for wheezing or shortness of breath.  Dispense: 24 g; Refill: 1 - Fluticasone-Umeclidin-Vilant (TRELEGY ELLIPTA) 100-62.5-25 MCG/ACT AEPB; Inhale 1 puff into the lungs daily.  Dispense: 3 each; Refill: 1  3. Hypertension, unspecified type Stable. Continue toprol as prescribed  - metoprolol succinate (TOPROL-XL) 100 MG 24 hr tablet; TAKE 1 AND 1/2 TABLETS (150 MG TOTAL) BY MOUTH DAILY.  Dispense: 135 tablet; Refill: 1  4. Hyperlipidemia, unspecified hyperlipidemia type Stable. Continue crestor and zetia as prescribed  - ezetimibe (ZETIA) 10 MG tablet; Take 1 tablet (10 mg total) by mouth daily.  Dispense: 90 tablet; Refill: 1 - rosuvastatin (CRESTOR) 40 MG tablet; Take 1 tablet (40 mg total) by mouth at bedtime.  Dispense: 90 tablet; Refill: 1  5. Gastroesophageal reflux disease, unspecified whether esophagitis present Continue prilozec as prescribed  - omeprazole (PRILOSEC) 40 MG capsule; TAKE 1 CAPSULE (40 MG TOTAL) BY MOUTH DAILY. NEED OFFICE VISIT FOR REFILLS  Dispense: 90 capsule; Refill: 1  6. Chronic gout due to renal impairment of multiple sites without tophus Take allopurinol daily.  - allopurinol (ZYLOPRIM) 100 MG tablet; Take 1 tablet (100 mg total) by mouth daily.  Dispense: 90 tablet; Refill: 1  7. Insomnia, unspecified type May continue Ambien 5 mg at bedtime as needed for acute insomnia.  - zolpidem (AMBIEN) 5 MG tablet; Take 1 tablet (5 mg total) by mouth at bedtime as needed for sleep.  Dispense: 90 tablet; Refill: 1   Hearing/Vision screen No results found.    Depression Screen   Row Labels 07/09/2022    1:52 PM 04/04/2022    1:59 PM 02/07/2022    1:17 PM 01/30/2022    8:47 AM 01/03/2022   11:05 AM 12/21/2021    2:00 PM 10/27/2021   11:06 AM  PHQ 2/9 Scores   Section Header. No data exists in this row.          PHQ - 2 Score   0 0 0 0 0 0 0  PHQ- 9 Score   2  6    3    $ Fall Risk   Row Labels 07/09/2022    1:52 PM 06/28/2022    1:06 PM 04/04/2022    1:59 PM 02/07/2022    1:17 PM 01/30/2022    8:47 AM  Fall Risk    Section Header. No data exists in this row.       Falls in the past year?   0 0 0 0 0  Number falls in past yr:   0   0   Injury with Fall?   0   0   Risk for fall due to :      No Fall Risks   Follow up   Falls evaluation completed   Falls evaluation completed     [place]:  Any stairs in or around the home? Yes  If so, are there any without handrails? No  Home free of loose throw rugs in walkways, pet beds, electrical cords, etc? Yes  Adequate lighting in your home to reduce risk of falls? Yes   ASSISTIVE DEVICES UTILIZED TO PREVENT FALLS:  Life alert? No  Use of a cane, walker or w/c? No  Grab bars in the bathroom? Yes  Shower chair or bench in shower? No  Elevated toilet  seat or a handicapped toilet? Yes   TIMED UP AND GO:  Was the test performed? Yes .  Length of time to ambulate 10 feet: 10 sec.   Gait steady and fast without use of assistive device  Cognitive Function:       Row Labels 07/09/2022    1:56 PM  6CIT Screen   Section Header. No data exists in this row.   What Year?   0 points  What month?   0 points  What time?   0 points  Count back from 20   0 points  Months in reverse   2 points  Repeat phrase   0 points  Total Score   2 points    Immunizations Immunization History  Administered Date(s) Administered   Influenza, High Dose Seasonal PF 02/25/2018   Influenza-Unspecified 02/10/2019, 02/05/2022, 02/06/2022   Moderna Sars-Covid-2 Vaccination 05/18/2020, 12/27/2020   PFIZER(Purple Top)SARS-COV-2 Vaccination 07/15/2019, 08/04/2019   Pneumococcal-Unspecified 12/16/2019    TDAP status: Due, Education has been provided regarding the importance of this vaccine. Advised may receive this vaccine at  local pharmacy or Health Dept. Aware to provide a copy of the vaccination record if obtained from local pharmacy or Health Dept. Verbalized acceptance and understanding.  Flu Vaccine status: Up to date  Pneumococcal vaccine status: Due, Education has been provided regarding the importance of this vaccine. Advised may receive this vaccine at local pharmacy or Health Dept. Aware to provide a copy of the vaccination record if obtained from local pharmacy or Health Dept. Verbalized acceptance and understanding.  Covid-19 vaccine status: Completed vaccines  Qualifies for Shingles Vaccine? Yes   Zostavax completed No   Shingrix Completed?: No.    Education has been provided regarding the importance of this vaccine. Patient has been advised to call insurance company to determine out of pocket expense if they have not yet received this vaccine. Advised may also receive vaccine at local pharmacy or Health Dept. Verbalized acceptance and understanding.  Screening Tests Health Maintenance  Topic Date Due   Pneumonia Vaccine 82+ Years old (1 of 2 - PCV) 06/01/1956   Hepatitis C Screening  Never done   DTaP/Tdap/Td (1 - Tdap) Never done   Zoster Vaccines- Shingrix (1 of 2) Never done   COVID-19 Vaccine (5 - 2023-24 season) 01/19/2022   Medicare Annual Wellness (AWV)  07/10/2023   COLONOSCOPY (Pts 45-73yr Insurance coverage will need to be confirmed)  09/17/2029   INFLUENZA VACCINE  Completed   HPV VACCINES  Aged Out    Health Maintenance  Health Maintenance Due  Topic Date Due   Pneumonia Vaccine 72 Years old (1 of 2 - PCV) 06/01/1956   Hepatitis C Screening  Never done   DTaP/Tdap/Td (1 - Tdap) Never done   Zoster Vaccines- Shingrix (1 of 2) Never done   COVID-19 Vaccine (5 - 2023-24 season) 01/19/2022    Colorectal cancer screening: Type of screening: Colonoscopy. Completed 2021. Repeat every 10 years  Lung Cancer Screening: (Low Dose CT Chest recommended if Age 72-80years, 30  pack-year currently smoking OR have quit w/in 15years.) does not qualify.   Lung Cancer Screening Referral: n/a - does not qualify   Additional Screening:  Hepatitis C Screening: does not qualify; Completed n/a  Vision Screening: Recommended annual ophthalmology exams for early detection of glaucoma and other disorders of the eye. Is the patient up to date with their annual eye exam?  Yes  Who is the provider or what is the name  of the office in which the patient attends annual eye exams? Dr. Corene Cornea If pt is not established with a provider, would they like to be referred to a provider to establish care? No .   Dental Screening: Recommended annual dental exams for proper oral hygiene  Community Resource Referral / Chronic Care Management: CRR required this visit?  No   CCM required this visit?  No      Plan:     I have personally reviewed and noted the following in the patient's chart:   Medical and social history Use of alcohol, tobacco or illicit drugs  Current medications and supplements including opioid prescriptions. Patient is currently taking opioid medications and sees pain management provider.  Functional ability and status Nutritional status Physical activity Advanced directives List of other physicians Hospitalizations, surgeries, and ER visits in previous 12 months Vitals Screenings to include cognitive, depression, and falls Referrals and appointments  In addition, I have reviewed and discussed with patient certain preventive protocols, quality metrics, and best practice recommendations. A written personalized care plan for preventive services as well as general preventive health recommendations were provided to patient.    Patient goal is to keep living   Ronnell Freshwater, NP   07/09/2022   Nurse Notes: 40 min face to face

## 2022-07-10 NOTE — Progress Notes (Signed)
Remote ICD transmission.   

## 2022-07-11 ENCOUNTER — Telehealth: Payer: Self-pay | Admitting: Student in an Organized Health Care Education/Training Program

## 2022-07-11 LAB — THYROID PANEL WITH TSH
Free Thyroxine Index: 2 (ref 1.2–4.9)
T3 Uptake Ratio: 29 % (ref 24–39)
T4, Total: 6.8 ug/dL (ref 4.5–12.0)
TSH: 3.89 u[IU]/mL (ref 0.450–4.500)

## 2022-07-11 LAB — SEDIMENTATION RATE: Sed Rate: 6 mm/hr (ref 0–30)

## 2022-07-11 LAB — MYASTHENIA GRAVIS PROFILE
AChR Binding Ab, Serum: <0.03 nmol/L (ref 0.00–0.24)
AChR-modulating Ab: 0 % (ref 0–45)
Acetylchol Block Ab: 5 % (ref 0–25)
Anti-striation Abs: NEGATIVE

## 2022-07-11 LAB — C-REACTIVE PROTEIN: CRP: 18 mg/L — ABNORMAL HIGH (ref 0–10)

## 2022-07-11 LAB — MUSK ANTIBODIES: MuSK Antibodies: <1 U/mL

## 2022-07-11 NOTE — Telephone Encounter (Signed)
PA done

## 2022-07-11 NOTE — Telephone Encounter (Signed)
Patient states insurance told him he needs PA for his Buprenorphine

## 2022-07-16 ENCOUNTER — Telehealth: Payer: Self-pay

## 2022-07-16 DIAGNOSIS — Z1211 Encounter for screening for malignant neoplasm of colon: Secondary | ICD-10-CM

## 2022-07-16 NOTE — Telephone Encounter (Signed)
Pt is new to the area anywhere is fine.

## 2022-07-16 NOTE — Telephone Encounter (Signed)
Pt is calling wanting a referral to get his Colonoscopy last one was in 2021.

## 2022-07-16 NOTE — Telephone Encounter (Signed)
Referral has been placed. Called patient to find out if he wanted referral sent to a specific location. Sounded like patient answered but was unable to hear me.

## 2022-07-25 ENCOUNTER — Ambulatory Visit: Payer: Medicare PPO | Admitting: Urology

## 2022-07-31 ENCOUNTER — Ambulatory Visit: Payer: Medicare PPO | Attending: Physician Assistant | Admitting: Physician Assistant

## 2022-07-31 ENCOUNTER — Other Ambulatory Visit: Payer: Self-pay

## 2022-07-31 ENCOUNTER — Telehealth: Payer: Self-pay

## 2022-07-31 ENCOUNTER — Encounter: Payer: Self-pay | Admitting: Physician Assistant

## 2022-07-31 ENCOUNTER — Telehealth: Payer: Self-pay | Admitting: Student in an Organized Health Care Education/Training Program

## 2022-07-31 VITALS — BP 140/84 | HR 63 | Ht 66.0 in | Wt 124.0 lb

## 2022-07-31 DIAGNOSIS — Z95810 Presence of automatic (implantable) cardiac defibrillator: Secondary | ICD-10-CM | POA: Diagnosis not present

## 2022-07-31 DIAGNOSIS — I255 Ischemic cardiomyopathy: Secondary | ICD-10-CM

## 2022-07-31 DIAGNOSIS — D6869 Other thrombophilia: Secondary | ICD-10-CM

## 2022-07-31 DIAGNOSIS — I491 Atrial premature depolarization: Secondary | ICD-10-CM

## 2022-07-31 DIAGNOSIS — I5022 Chronic systolic (congestive) heart failure: Secondary | ICD-10-CM | POA: Diagnosis not present

## 2022-07-31 DIAGNOSIS — I480 Paroxysmal atrial fibrillation: Secondary | ICD-10-CM

## 2022-07-31 DIAGNOSIS — I2510 Atherosclerotic heart disease of native coronary artery without angina pectoris: Secondary | ICD-10-CM

## 2022-07-31 DIAGNOSIS — I493 Ventricular premature depolarization: Secondary | ICD-10-CM

## 2022-07-31 LAB — CUP PACEART INCLINIC DEVICE CHECK
Date Time Interrogation Session: 20240312165651
Implantable Lead Connection Status: 753985
Implantable Lead Implant Date: 20180209
Implantable Lead Location: 753860
Implantable Lead Model: 6935
Implantable Pulse Generator Implant Date: 20180209

## 2022-07-31 MED ORDER — FUROSEMIDE 40 MG PO TABS: 40.0000 mg | ORAL_TABLET | Freq: Every day | ORAL | 0 refills | Status: AC | PRN

## 2022-07-31 MED ORDER — OXYCODONE-ACETAMINOPHEN 10-325 MG PO TABS
1.0000 | ORAL_TABLET | Freq: Three times a day (TID) | ORAL | 0 refills | Status: AC | PRN
  Filled 2022-07-31 – 2022-08-01 (×2): qty 90, 30d supply, fill #0

## 2022-07-31 NOTE — Telephone Encounter (Signed)
PT stated that he has called around to varies of pharmacy and none of the pharmacy has the oxycodone in stock. PT wants to see if prescription can be see to pharmacy here. Please give patient a call, PT stated to leave a voicemail. TY

## 2022-07-31 NOTE — Telephone Encounter (Signed)
The patient states his ICD is beeping. He feel like he needs to come in for some programming changes. I had the patient send in a manual transmission for the nurse to review. Transmission received.

## 2022-07-31 NOTE — Telephone Encounter (Signed)
Refill request sent to Dr Lateef.  

## 2022-07-31 NOTE — Patient Instructions (Signed)
Medication Instructions:    FOR THREE DAYS ONLY :  TAKE FUROSEMIDE  40 MG  TWICE A DAY AND POTASSIUM  10 MEQ TWICE A DAY   *If you need a refill on your cardiac medications before your next appointment, please call your pharmacy*   Lab Work:  [facility] AND [place]    If you have labs (blood work) drawn today and your tests are completely normal, you will receive your results only by: [place] (if you have MyChart) OR A paper copy in the mail If you have any lab test that is abnormal or we need to change your treatment, we will call you to review the results.   Testing/Procedures: Your physician has requested that you have an echocardiogram. Echocardiography is a painless test that uses sound waves to create images of your heart. It provides your doctor with information about the size and shape of your heart and how well your heart's chambers and valves are working. This procedure takes approximately one hour. There are no restrictions for this procedure. Please do NOT wear cologne, perfume, aftershave, or lotions (deodorant is allowed). Please arrive 15 minutes prior to your appointment time.    Follow-Up: At [facility], you and your health needs are our priority.  As part of our continuing mission to provide you with exceptional heart care, we have created designated Provider Care Teams.  These Care Teams include your primary Cardiologist (physician) and Advanced Practice Providers (APPs -  Physician Assistants and Nurse Practitioners) who all work together to provide you with the care you need, when you need it.  We recommend signing up for the patient portal called "MyChart".  Sign up information is provided on this After Visit Summary.  MyChart is used to connect with patients for Virtual Visits (Telemedicine).  Patients are able to view lab/test results, encounter notes, upcoming appointments, etc.  Non-urgent messages can be sent to your provider as well.   To learn  more about what you can do with MyChart, go to [email or web address].    Your next appointment:   1 month(s)  Provider:   Tommye Standard, PA-C    Other Instructions

## 2022-07-31 NOTE — Telephone Encounter (Addendum)
Reviewed patient's transmission.  Do not see any alert settings that have triggered that would produce an alarm.   What I do see is the following: Patient has increased AF burden over past month. There are also elevated V rates.  Also, significant elevations in his Optivol.  He is symptomatic with SOB on exertion.  Discussed with patient.  He does not believe that it is not his device that is beeping.  He states this happened about 1 year ago and he needed a program change.  He thinks the sound is similar but can't remember.  Patient needs evaluation for sx, burden and signs of fluid overload.   Tommye Standard, PA-C has an opening today at 310pm.  Patient would like to come in for evaluation.  Appt made.

## 2022-07-31 NOTE — Progress Notes (Signed)
Cardiology Office Note Date:  07/31/2022  Patient ID:  Andrew Townsend, Dou 03/09/1951, MRN [id number] PCP:  Velva Harman, PA  Cardiologist:  Dr. Harrell Gave Electrophysiologist: Dr. Lovena Le    Chief Complaint:  SOB, device toning  History of Present Illness: Andrew Townsend is a 72 y.o. male with history of CAD (CABG), ischemic CM, chronic CHF, CKD (III), AFib, PVD (renal artery  stent), VT, stroke  He saw Dr. Lovena Le last on 09/12/21, found with new AFib and started on [place].  No changes made otherwise with reports of class II symptoms.  He has seen Dr. Harrell Gave a few times since then, most recently 04/16/22, recently referred to ophthalmology with some reports of double vision on occasion when watching TV Was euvolemic, with PRN lasix Eliquis dose reduced 2/2 weight and Creat  Calls today  with reports of hearing a beeping from his device and SOB, device clinic did not appreciate via remote and indication of device alert or reasoning for the device to be toning, did note increased Afib burden and added on to my schedule  TODAY He reoprts that last night/early this Am about 0100 his device made the tome just like it had  a couple years ago. He reports being wide awake and did not think he was dreaming or hearing anything else in his house. No CP Occassionally aware of strong heart beats, not fast. No rest SOB. Chrinically when he is first up and moving around feels mildly breathless though once he uses his Trelegy is better In the last wee or so, he has been a little breathless despite the trelegy. No profund SOB or air hunger No edema, swelling rarely feels the need to use his lasix. No near syncope or syncope No bleeding or signs of bleeding   Device information MDT single chamber ICD implanted 06/29/2016   Past Medical History:  Diagnosis Date   Arthritis    Blood transfusion without reported diagnosis    CAD (coronary artery disease)    Cataract    Chronic kidney disease     COPD (chronic obstructive pulmonary disease) (HCC)    GERD (gastroesophageal reflux disease)    Hyperlipidemia    Hypertension    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)    Myocardial infarction ([place])    Stroke [facility])     Past Surgical History:  Procedure Laterality Date   CORONARY ARTERY BYPASS GRAFT     HERNIA REPAIR      Current Outpatient Medications  Medication Sig Dispense Refill   albuterol (PROVENTIL) (2.5 MG/3ML) 0.083% nebulizer solution INHALE THE CONTENTS OF 1 VIAL VIA NEBULIZER EVERY 6 HOURS AS NEEDED FOR WHEEZING OR SHORTNESS OF BREATH. 150 mL 1   albuterol (VENTOLIN HFA) 108 (90 Base) MCG/ACT inhaler Inhale 2 puffs into the lungs every 6 (six) hours as needed for wheezing or shortness of breath. 24 g 1   allopurinol (ZYLOPRIM) 100 MG tablet Take 1 tablet (100 mg total) by mouth daily. 90 tablet 1   apixaban (ELIQUIS) 2.5 MG TABS tablet Take 1 tablet (2.5 mg total) by mouth 2 (two) times daily. 60 tablet 11   buprenorphine (BUTRANS) 20 MCG/HR PTWK Place 1 patch onto the skin once a week. 4 patch 2   empagliflozin (JARDIANCE) 10 MG TABS tablet Take 10 mg by mouth daily.     ezetimibe (ZETIA) 10 MG tablet Take 1 tablet (10 mg total) by mouth daily. 90 tablet 1   Fluticasone-Umeclidin-Vilant (TRELEGY ELLIPTA) 100-62.5-25 MCG/ACT AEPB  Inhale 1 puff into the lungs daily. 3 each 1   gabapentin (NEURONTIN) 300 MG capsule Take 2 capsules (600 mg total) by mouth at bedtime. 60 capsule 5   Magnesium Oxide (DIASENSE MAGNESIUM PO) Take 800 mg by mouth daily at 6 (six) AM.     metoprolol succinate (TOPROL-XL) 100 MG 24 hr tablet TAKE 1 AND 1/2 TABLETS (150 MG TOTAL) BY MOUTH DAILY. 135 tablet 1   Multiple Vitamin (MULTIVITAMIN) tablet Take 1 tablet by mouth daily.     nitroGLYCERIN (NITROSTAT) 0.4 MG SL tablet Place 1 tablet (0.4 mg total) under the tongue every 5 (five) minutes as needed for chest pain. 30 tablet 1   omeprazole (PRILOSEC) 40 MG  capsule TAKE 1 CAPSULE (40 MG TOTAL) BY MOUTH DAILY. NEED OFFICE VISIT FOR REFILLS 90 capsule 1   oxybutynin (DITROPAN-XL) 10 MG 24 hr tablet TAKE 1 TABLET (10 MG TOTAL) BY MOUTH DAILY. 90 tablet 0   [START ON 08/03/2022] oxyCODONE-acetaminophen (PERCOCET) 10-325 MG tablet Take 1 tablet by mouth every 8 (eight) hours as needed for pain. 90 tablet 0   [START ON 09/02/2022] oxyCODONE-acetaminophen (PERCOCET) 10-325 MG tablet Take 1 tablet by mouth every 8 (eight) hours as needed for pain. 90 tablet 0   oxyCODONE-acetaminophen (PERCOCET) 10-325 MG tablet Take 1 tablet by mouth every 8 (eight) hours as needed for pain. 90 tablet 0   potassium chloride (KLOR-CON M) 10 MEQ tablet Take 10 mEq by mouth daily.     rosuvastatin (CRESTOR) 40 MG tablet Take 1 tablet (40 mg total) by mouth at bedtime. 90 tablet 1   tiZANidine (ZANAFLEX) 4 MG tablet Take 1 tablet (4 mg total) by mouth every 8 (eight) hours as needed. 90 tablet 5   zolpidem (AMBIEN) 5 MG tablet Take 1 tablet (5 mg total) by mouth at bedtime as needed for sleep. 90 tablet 1   furosemide (LASIX) 40 MG tablet Take 1 tablet (40 mg total) by mouth daily as needed for fluid. 90 tablet 0   No current facility-administered medications for this visit.    Allergies:   Patient has no known allergies.   Social History:  The patient  reports that he has been smoking e-cigarettes. He has never used smokeless tobacco. He reports that he does not use drugs.   Family History:  The patient's family history is not on file.  ROS:  Please see the history of present illness.    All other systems are reviewed and otherwise negative.   PHYSICAL EXAM:  VS:  BP (!) 140/84   Pulse 63   Ht '5\' 6"'$  (1.676 m)   Wt 124 lb (56.2 kg)   SpO2 95%   BMI 20.01 kg/m  BMI: Body mass index is 20.01 kg/m. Well nourished, well developed, in no acute distress HEENT: normocephalic, atraumatic Neck: no JVD, carotid bruits or masses Cardiac:  RRR;  extrasystoles noted, no  significant murmurs, no rubs, or gallops Lungs:  CTA b/l, no wheezing, rhonchi or rales Abd: soft, nontender MS: no deformity, very thin body habitus, advanced atrophy for age Ext: no edema Skin: warm and dry, no rash Neuro:  No gross deficits appreciated Psych: euthymic mood, full affect  PPM site is stable, no tethering or discomfort   EKG:  Done today and reviewed by myself shows  SR 63bpm, IVCD, PACs and PVCs  Device interrogation done today and reviewed by myself:  Battery and lead measurements are good Device is a visa AF, one lead. Notes an  increased AFib burden, though today is not AFib by EKG with frequent ectopy. I am not convinced all the AF burden by the device is true AF No VT No therapies OptiVol is way up Good HR histogram   Echo 03/27/2021 ([facility]): Left Ventricle:  The left ventricular chamber size is normal. There is no left  ventricular hypertrophy. There is mildly decreased left ventricular  systolic function. The estimated ejection fraction is 45-50%.  There is  abnormal ventricular septal wall motion consistent with right  ventricular pacemaker.  Abnormal left ventricular diastolic function is  observed.   Left Atrium:  The left atrial chamber size is normal.   Right Ventricle:  The right ventricular cavity size is normal. The right ventricular  global systolic function is normal. A pacemaker wire is visualized in  the right ventricle.   Right Atrium:  The right atrial cavity size is normal.   Aortic Valve:  The aortic valve is trileaflet. There is no evidence of aortic stenosis.  There is no evidence of aortic regurgitation.   Mitral Valve:  The mitral valve leaflets appear normal. There is no evidence of mitral  stenosis. There is a trace of mitral regurgitation.   Tricuspid Valve:  There is a trace tricuspid regurgitation.     Pulmonic Valve:  The pulmonic valve appears normal in structure and function.    Pericardium:  The pericardium appears normal.   Aorta:  The aorta appears normal.     Pulmonary Artery:  The main pulmonary artery appears normal.   Conclusions:  Mildly depressed LV function.  Maybe inferior base hypokinesis but not  seen well.  No significant valve disease.    Cardiac Catheterization 08/05/2020 ([facility]): Findings:  --LM: Distal LM has a 95% stenosis  --LAD: The proximal LAD has diffuse moderate-severe disease. The mid LAD is 100% occluded. The LIMA-LAD is patent. The distal LAD has mild diffuse disease. The SVG-diagonal is known to be occluded and was not injected.  --LCx: The proximal LCx is 100% occluded. The SVG-OM is widely patent. The distal OM territory it serves has only minimal luminal disease.  --RCA: Not engaged. Known to be 100% occluded in the proximal segment. The SVG-PDA is patent but has TIMI II flow. There are R-L collaterals.   Intervention: none     Recent Labs: 07/04/2022: ALT 21; BUN 22; Creatinine, Ser 1.50; Hemoglobin 12.9; Platelets 171; Potassium 5.1; Sodium 139; TSH 3.050  07/04/2022: Chol/HDL Ratio 2.6; Cholesterol, Total 113; HDL 44; LDL Chol Calc (NIH) 55; Triglycerides 63   CrCl cannot be calculated (Patient's most recent lab result is older than the maximum 21 days allowed.).   Wt Readings from Last 3 Encounters:  07/31/22 124 lb (56.2 kg)  07/09/22 121 lb (54.9 kg)  06/28/22 126 lb (57.2 kg)     Other studies reviewed: Additional studies/records reviewed today include: summarized above  ASSESSMENT AND PLAN:  ICD Intsct function Device checked with industry support, no noted alert notification is seen Patient alerts parameters reviewed and appropriate We do not see via the interrogation that a patient notifier/alert was delivered by the device  Paroxysmal Afib CHA2DS2Vasc is 6, on eliquis, appropriately dosed unclear burden given his ectopy  ICM Chronic CHF (systolic) [id number] by his last echo  (2022) Exam without obvious volume OL,  though he is a bit more SOB then usual and OptiVol is quite high I have advised him to take his PRN lasix daily for 2-3 days, and  on these days to double his potassium from 1 tab to 2 tabs    CAD No anginal symptoms On BB, statin, no ASA w/Eliquis C/w Dr. Harrell Gave  HTN No changes today  Secondary hypercoagulable state   PACs/PVCs Update his echo, he is on pretty high dose BB already, he reports good medication compliance Check labs/lytes today if these are OK, will go to '100mg'$  BID of his Toprol     Disposition: F/u with [place] in a month, sooner if needed    Current medicines are reviewed at length with the patient today.  The patient did not have any concerns regarding medicines.  Venetia Night, PA-C 07/31/2022 4:13 PM     [address] [place] [address] [phone] (office)  [phone] (fax)

## 2022-08-01 ENCOUNTER — Ambulatory Visit: Payer: Medicare PPO | Admitting: Urology

## 2022-08-01 ENCOUNTER — Encounter: Payer: Self-pay | Admitting: Urology

## 2022-08-01 ENCOUNTER — Other Ambulatory Visit: Payer: Self-pay

## 2022-08-01 VITALS — BP 121/76 | HR 56 | Ht 66.0 in | Wt 124.0 lb

## 2022-08-01 DIAGNOSIS — N3281 Overactive bladder: Secondary | ICD-10-CM | POA: Diagnosis not present

## 2022-08-01 DIAGNOSIS — N138 Other obstructive and reflux uropathy: Secondary | ICD-10-CM

## 2022-08-01 DIAGNOSIS — N401 Enlarged prostate with lower urinary tract symptoms: Secondary | ICD-10-CM

## 2022-08-01 LAB — BASIC METABOLIC PANEL
BUN/Creatinine Ratio: 14 (ref 10–24)
BUN: 20 mg/dL (ref 8–27)
CO2: 21 mmol/L (ref 20–29)
Calcium: 10.2 mg/dL (ref 8.6–10.2)
Chloride: 103 mmol/L (ref 96–106)
Creatinine, Ser: 1.45 mg/dL — ABNORMAL HIGH (ref 0.76–1.27)
Glucose: 74 mg/dL (ref 70–99)
Potassium: 5.5 mmol/L — ABNORMAL HIGH (ref 3.5–5.2)
Sodium: 141 mmol/L (ref 134–144)
eGFR: 51 mL/min/{1.73_m2} — ABNORMAL LOW (ref >59)

## 2022-08-01 LAB — BLADDER SCAN AMB NON-IMAGING

## 2022-08-01 LAB — MAGNESIUM: Magnesium: 2.1 mg/dL (ref 1.6–2.3)

## 2022-08-01 NOTE — Progress Notes (Signed)
   08/01/2022 12:29 PM   Andrew Townsend 10/20/1950 [id number]  Reason for visit: Follow up BPH/OAB  HPI: 72 year old male with a number of medical problems including hypertension, CAD, COPD, CKD, BPH who I originally saw on 06/07/2021 when he reported 6 to 12 months of worsening urinary symptoms including urgency and frequency during the day with some urge incontinence, as well as nocturia 4 times overnight.   Urinalysis was benign, PVR was normal at 42 mL, renal ultrasound in November 2022 was normal.  We opted for a trial of oxybutynin 10 mg XL, and behavioral strategies were discussed.  He had been on terazosin for BPH at baseline.  He originally was having significant improvement on the oxybutynin, and he ultimately discontinued the terazosin.  Over the last few months on the oxybutynin alone he does feel that he does not empty completely and has had some increased difficulty voiding.  He stopped the oxybutynin and since that time really has been doing relatively well.  He still has nocturia 2-3 times overnight and some urgency and frequency during the day.  I recommended trying the 5 mg oxybutynin dose to see if this improves some of his overactive symptoms without increasing urinary difficulty.  PVR today is normal at 91ml.  Urinalysis from February 2024 benign, renal function stable and at baseline.  He denies any gross hematuria, dysuria, or UTIs.  He also takes Lasix intermittently for fluid overload which exacerbates his urinary frequency.  Behavioral strategies discussed extensively, can trial oxybutynin 5 mg XL daily for OAB symptoms.  If still having sensation of incomplete emptying or difficulty initiating urination could trial Flomax in addition to the oxybutynin in the future.  He will reach out to [place] via MyChart with his urinary symptoms for an update in the next few months.  RTC 1 year PVR  Billey Co, MD  [facility] [address], [place], [place] [address] [phone]

## 2022-08-02 ENCOUNTER — Other Ambulatory Visit: Payer: Self-pay | Admitting: *Deleted

## 2022-08-02 DIAGNOSIS — Z79899 Other long term (current) drug therapy: Secondary | ICD-10-CM

## 2022-08-02 DIAGNOSIS — I5022 Chronic systolic (congestive) heart failure: Secondary | ICD-10-CM

## 2022-08-08 ENCOUNTER — Encounter (INDEPENDENT_AMBULATORY_CARE_PROVIDER_SITE_OTHER): Payer: Medicare PPO | Admitting: Family Medicine

## 2022-08-08 DIAGNOSIS — Z1211 Encounter for screening for malignant neoplasm of colon: Secondary | ICD-10-CM

## 2022-08-08 DIAGNOSIS — Z1212 Encounter for screening for malignant neoplasm of rectum: Secondary | ICD-10-CM | POA: Diagnosis not present

## 2022-08-08 NOTE — Telephone Encounter (Signed)

## 2022-08-09 ENCOUNTER — Ambulatory Visit: Payer: Medicare PPO | Attending: Physician Assistant

## 2022-08-09 DIAGNOSIS — Z79899 Other long term (current) drug therapy: Secondary | ICD-10-CM

## 2022-08-10 LAB — BASIC METABOLIC PANEL
BUN/Creatinine Ratio: 18 (ref 10–24)
BUN: 24 mg/dL (ref 8–27)
CO2: 21 mmol/L (ref 20–29)
Calcium: 9.5 mg/dL (ref 8.6–10.2)
Chloride: 106 mmol/L (ref 96–106)
Creatinine, Ser: 1.37 mg/dL — ABNORMAL HIGH (ref 0.76–1.27)
Glucose: 87 mg/dL (ref 70–99)
Potassium: 5 mmol/L (ref 3.5–5.2)
Sodium: 139 mmol/L (ref 134–144)
eGFR: 55 mL/min/{1.73_m2} — ABNORMAL LOW (ref >59)

## 2022-08-17 LAB — COLOGUARD: COLOGUARD: NEGATIVE

## 2022-08-24 ENCOUNTER — Ambulatory Visit (HOSPITAL_COMMUNITY): Payer: Medicare PPO | Attending: Physician Assistant

## 2022-08-24 DIAGNOSIS — I517 Cardiomegaly: Secondary | ICD-10-CM | POA: Diagnosis not present

## 2022-08-24 DIAGNOSIS — I340 Nonrheumatic mitral (valve) insufficiency: Secondary | ICD-10-CM | POA: Diagnosis not present

## 2022-08-24 DIAGNOSIS — D6869 Other thrombophilia: Secondary | ICD-10-CM | POA: Diagnosis present

## 2022-08-24 DIAGNOSIS — I5022 Chronic systolic (congestive) heart failure: Secondary | ICD-10-CM | POA: Diagnosis present

## 2022-08-24 DIAGNOSIS — I77810 Thoracic aortic ectasia: Secondary | ICD-10-CM | POA: Diagnosis not present

## 2022-08-27 LAB — ECHOCARDIOGRAM COMPLETE: S' Lateral: 3.8 cm

## 2022-08-27 NOTE — Progress Notes (Addendum)
Cardiology Office Note Date:  08/27/2022  Patient ID:  Andrew Townsend, Andrew Townsend 09-19-1950, MRN [id number] PCP:  Melida Quitter, PA  Cardiologist:  Dr. Cristal Deer Electrophysiologist: Dr. Ladona Ridgel    Chief Complaint:  planned f/u  History of Present Illness: Andrew Townsend is a 72 y.o. male with history of CAD (CABG), ischemic CM, chronic CHF, CKD (III), AFib, PVD (renal artery  stent), VT, stroke  He saw Dr. Ladona Ridgel last on 09/12/21, found with new AFib and started on OAC.  No changes made otherwise with reports of class II symptoms.  He has seen Dr. Cristal Deer a few times since then, most recently 04/16/22, recently referred to ophthalmology with some reports of double vision on occasion when watching TV Was euvolemic, with PRN lasix Eliquis dose reduced 2/2 weight and Creat  Calls today  with reports of hearing a beeping from his device and SOB, device clinic did not appreciate via remote and indication of device alert or reasoning for the device to be toning, did note increased Afib burden and added on to my schedule  I saw him 07/31/22 He reoprts that last night/early this Am about 0100 his device made the tone just like it had  a couple years ago. He reports being wide awake and did not think he was dreaming or hearing anything else in his house.  No CP Occassionally aware of strong heart beats, not fast. No rest SOB. Chrinically when he is first up and moving around feels mildly breathless though once he uses his Trelegy is better In the last wee or so, he has been a little breathless despite the trelegy. No profund SOB or air hunger No edema, swelling rarely feels the need to use his lasix. No near syncope or syncope No bleeding or signs of bleeding  EKG has PACs/PVCs, Device is a visa AF and notes an increased AFib burden, though today is not AFib by EKG with frequent ectopy. I am not convinced all the AF burden by the device is true AF Planned for labs and updated echo, if lytes  were OK, planned to increase his Toprol to 100mg  BID Diuretics pulsed Lab noted slightly elevated K+  > 5.0 on recheck  Toprol was not increased   LVEF 50-55%, no WMA, RV OK, no sign VHD AO root 4.2cm   TODAY He is doing well No particular symptoms No ongoing toning that he has noted In discussion about his echo, labs, discussed improved Creat and his Eliquis dose. He reports that he sees a nephrologist, Dr. Suezanne Jacquet  Device information MDT single chamber ICD implanted 06/29/2016   Past Medical History:  Diagnosis Date   Arthritis    Blood transfusion without reported diagnosis    CAD (coronary artery disease)    Cataract    Chronic kidney disease    COPD (chronic obstructive pulmonary disease) (HCC)    GERD (gastroesophageal reflux disease)    Hyperlipidemia    Hypertension    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)    Myocardial infarction (HCC)    Stroke [facility])     Past Surgical History:  Procedure Laterality Date   CORONARY ARTERY BYPASS GRAFT     HERNIA REPAIR      Current Outpatient Medications  Medication Sig Dispense Refill   albuterol (PROVENTIL) (2.5 MG/3ML) 0.083% nebulizer solution INHALE THE CONTENTS OF 1 VIAL VIA NEBULIZER EVERY 6 HOURS AS NEEDED FOR WHEEZING OR SHORTNESS OF BREATH. 150 mL 1   albuterol (VENTOLIN HFA) 108 (  90 Base) MCG/ACT inhaler Inhale 2 puffs into the lungs every 6 (six) hours as needed for wheezing or shortness of breath. 24 g 1   allopurinol (ZYLOPRIM) 100 MG tablet Take 1 tablet (100 mg total) by mouth daily. 90 tablet 1   apixaban (ELIQUIS) 2.5 MG TABS tablet Take 1 tablet (2.5 mg total) by mouth 2 (two) times daily. 60 tablet 11   buprenorphine (BUTRANS) 20 MCG/HR PTWK Place 1 patch onto the skin once a week. 4 patch 2   empagliflozin (JARDIANCE) 10 MG TABS tablet Take 10 mg by mouth daily.     ezetimibe (ZETIA) 10 MG tablet Take 1 tablet (10 mg total) by mouth daily. 90 tablet 1    Fluticasone-Umeclidin-Vilant (TRELEGY ELLIPTA) 100-62.5-25 MCG/ACT AEPB Inhale 1 puff into the lungs daily. 3 each 1   furosemide (LASIX) 40 MG tablet Take 1 tablet (40 mg total) by mouth daily as needed for fluid. 90 tablet 0   gabapentin (NEURONTIN) 300 MG capsule Take 2 capsules (600 mg total) by mouth at bedtime. 60 capsule 5   Magnesium Oxide (DIASENSE MAGNESIUM PO) Take 800 mg by mouth daily at 6 (six) AM.     metoprolol succinate (TOPROL-XL) 100 MG 24 hr tablet TAKE 1 AND 1/2 TABLETS (150 MG TOTAL) BY MOUTH DAILY. 135 tablet 1   Multiple Vitamin (MULTIVITAMIN) tablet Take 1 tablet by mouth daily.     nitroGLYCERIN (NITROSTAT) 0.4 MG SL tablet Place 1 tablet (0.4 mg total) under the tongue every 5 (five) minutes as needed for chest pain. 30 tablet 1   omeprazole (PRILOSEC) 40 MG capsule TAKE 1 CAPSULE (40 MG TOTAL) BY MOUTH DAILY. NEED OFFICE VISIT FOR REFILLS 90 capsule 1   oxybutynin (DITROPAN-XL) 10 MG 24 hr tablet TAKE 1 TABLET (10 MG TOTAL) BY MOUTH DAILY. 90 tablet 0   oxyCODONE-acetaminophen (PERCOCET) 10-325 MG tablet Take 1 tablet by mouth every 8 (eight) hours as needed for pain. 90 tablet 0   [START ON 09/02/2022] oxyCODONE-acetaminophen (PERCOCET) 10-325 MG tablet Take 1 tablet by mouth every 8 (eight) hours as needed for pain. 90 tablet 0   oxyCODONE-acetaminophen (PERCOCET) 10-325 MG tablet Take 1 tablet by mouth every 8 (eight) hours as needed for pain. 90 tablet 0   potassium chloride (KLOR-CON M) 10 MEQ tablet Take 10 mEq by mouth daily.     rosuvastatin (CRESTOR) 40 MG tablet Take 1 tablet (40 mg total) by mouth at bedtime. 90 tablet 1   tiZANidine (ZANAFLEX) 4 MG tablet Take 1 tablet (4 mg total) by mouth every 8 (eight) hours as needed. 90 tablet 5   zolpidem (AMBIEN) 5 MG tablet Take 1 tablet (5 mg total) by mouth at bedtime as needed for sleep. 90 tablet 1   No current facility-administered medications for this visit.    Allergies:   Patient has no known allergies.    Social History:  The patient  reports that he has been smoking e-cigarettes. He has never used smokeless tobacco. He reports that he does not use drugs.   Family History:  The patient's family history is not on file.  ROS:  Please see the history of present illness.    All other systems are reviewed and otherwise negative.   PHYSICAL EXAM:  VS:  There were no vitals taken for this visit. BMI: There is no height or weight on file to calculate BMI. Well nourished, well developed, in no acute distress HEENT: normocephalic, atraumatic Neck: no JVD, carotid bruits or masses  Cardiac:  RRR;  extrasystoles noted, no significant murmurs, no rubs, or gallops Lungs:  CTA b/l, no wheezing, rhonchi or rales Abd: soft, nontender MS: no deformity, very thin body habitus, advanced atrophy for age Ext:  no edema Skin: warm and dry, no rash Neuro:  No gross deficits appreciated Psych: euthymic mood, full affect  PPM site is stable, no tethering or discomfort   EKG:  done today and reviewed by myself SR, junctional beat, PVCs, and V paced beat all noted on rhythm strip  Device interrogation done today and reviewed by myself:  Battery and lead measurements are stable 128 episodes labeled AF EGM available are reviewed, again, suspect this is ectopy  Unclear true AF burden   08/24/22: TTE 1. Left ventricular ejection fraction, by estimation, is 50 to 55%. Left  ventricular ejection fraction by 3D volume is 52 %. The left ventricle has  low normal function. The left ventricle has no regional wall motion  abnormalities. There is mild left  ventricular hypertrophy of the basal-septal segment. Left ventricular  diastolic parameters are indeterminate.   2. Right ventricular systolic function is normal. The right ventricular  size is mildly enlarged.   3. Left atrial size was severely dilated.   4. Right atrial size was severely dilated.   5. The mitral valve is normal in structure. Mild mitral  valve  regurgitation. No evidence of mitral stenosis.   6. The aortic valve is normal in structure. Aortic valve regurgitation is  not visualized. No aortic stenosis is present.   7. Aortic dilatation noted. There is mild dilatation of the aortic root,  measuring 42 mm.   8. The inferior vena cava is normal in size with greater than 50%  respiratory variability, suggesting right atrial pressure of 3 mmHg.   Echo 03/27/2021 [facility]): Left Ventricle:  The left ventricular chamber size is normal. There is no left  ventricular hypertrophy. There is mildly decreased left ventricular  systolic function. The estimated ejection fraction is 45-50%.  There is  abnormal ventricular septal wall motion consistent with right  ventricular pacemaker.  Abnormal left ventricular diastolic function is  observed.   Left Atrium:  The left atrial chamber size is normal.   Right Ventricle:  The right ventricular cavity size is normal. The right ventricular  global systolic function is normal. A pacemaker wire is visualized in  the right ventricle.   Right Atrium:  The right atrial cavity size is normal.   Aortic Valve:  The aortic valve is trileaflet. There is no evidence of aortic stenosis.  There is no evidence of aortic regurgitation.   Mitral Valve:  The mitral valve leaflets appear normal. There is no evidence of mitral  stenosis. There is a trace of mitral regurgitation.   Tricuspid Valve:  There is a trace tricuspid regurgitation.     Pulmonic Valve:  The pulmonic valve appears normal in structure and function.   Pericardium:  The pericardium appears normal.   Aorta:  The aorta appears normal.     Pulmonary Artery:  The main pulmonary artery appears normal.   Conclusions:  Mildly depressed LV function.  Maybe inferior base hypokinesis but not  seen well.  No significant valve disease.    Cardiac Catheterization 08/05/2020 ([facility] Findings:  --LM: Distal LM has a 95% stenosis  --LAD: The proximal LAD has diffuse moderate-severe disease. The mid LAD is 100% occluded. The LIMA-LAD is patent. The distal LAD has mild diffuse  disease. The SVG-diagonal is known to be occluded and was not injected.  --LCx: The proximal LCx is 100% occluded. The SVG-OM is widely patent. The distal OM territory it serves has only minimal luminal disease.  --RCA: Not engaged. Known to be 100% occluded in the proximal segment. The SVG-PDA is patent but has TIMI II flow. There are R-L collaterals.   Intervention: none     Recent Labs: 07/04/2022: ALT 21; Hemoglobin 12.9; Platelets 171; TSH 3.050 07/31/2022: Magnesium 2.1 08/09/2022: BUN 24; Creatinine, Ser 1.37; Potassium 5.0; Sodium 139  07/04/2022: Chol/HDL Ratio 2.6; Cholesterol, Total 113; HDL 44; LDL Chol Calc (NIH) 55; Triglycerides 63   CrCl cannot be calculated (Unknown ideal weight.).   Wt Readings from Last 3 Encounters:  08/01/22 124 lb (56.2 kg)  07/31/22 124 lb (56.2 kg)  07/09/22 121 lb (54.9 kg)     Other studies reviewed: Additional studies/records reviewed today include: summarized above  ASSESSMENT AND PLAN:  ICD intact function no changes made  Paroxysmal Afib CHA2DS2Vasc is 6, on eliquis unclear burden given his ectopy  Creat is much improved from last year He sees Dr. Suezanne JacquetKorrapati for nephrology BMET again today to assess stability, if remains improved, will need to increase his Eliquis dose   ICM Chronic CHF (systolic) Recovered to 50-55%, mild hypertrophy of the basal-septal segment  Optivol way down again/much better  CAD No anginal symptoms On BB, statin, no ASA w/Eliquis C/w Dr. Cristal Deerhristopher  HTN No changes today  Secondary hypercoagulable state   PACs/PVCs He is having some V paced beats, 8% by his device, so will not press BB Will get a monitor to assess PVC burden, and d/w Dr. Ladona Ridgelaylor findings and plan.   9. Dilated AO  root. Discussed getting baseline CT Will reach out to Dr. Cristal Deerhristopher, sees her next month.  With his hx of CKD that has been worse, will defer to her, need/timing of a baseline CT  Addend 09/11/22  Dr. Cristal Deerhristopher reviewed,  Didn't think it's urgent--we can repeat a limited echo in 6 mos or do an MR angio noncontrast if his kidney function is questionable  Andrew Dowseenee Eulanda Dorion, PA-C     Disposition: F/u with us in a month, sooner if needed    Current medicines are reviewed at length with the patient today.  The patient did not have any concerns regarding medicines.  Norma FredricksonSigned, Odis Wickey, PA-C 08/27/2022 1:48 PM     [facility] [address] [place] [address] [phone] (office)  [phone] (fax)

## 2022-08-31 ENCOUNTER — Ambulatory Visit: Payer: Medicare PPO | Attending: Physician Assistant | Admitting: Physician Assistant

## 2022-08-31 ENCOUNTER — Ambulatory Visit (INDEPENDENT_AMBULATORY_CARE_PROVIDER_SITE_OTHER): Payer: Medicare PPO

## 2022-08-31 ENCOUNTER — Encounter: Payer: Self-pay | Admitting: Physician Assistant

## 2022-08-31 VITALS — BP 130/76 | HR 50 | Ht 66.0 in | Wt 119.0 lb

## 2022-08-31 DIAGNOSIS — I2510 Atherosclerotic heart disease of native coronary artery without angina pectoris: Secondary | ICD-10-CM

## 2022-08-31 DIAGNOSIS — Z95810 Presence of automatic (implantable) cardiac defibrillator: Secondary | ICD-10-CM

## 2022-08-31 DIAGNOSIS — I10 Essential (primary) hypertension: Secondary | ICD-10-CM

## 2022-08-31 DIAGNOSIS — I255 Ischemic cardiomyopathy: Secondary | ICD-10-CM

## 2022-08-31 DIAGNOSIS — I480 Paroxysmal atrial fibrillation: Secondary | ICD-10-CM | POA: Diagnosis not present

## 2022-08-31 DIAGNOSIS — I493 Ventricular premature depolarization: Secondary | ICD-10-CM

## 2022-08-31 DIAGNOSIS — I5022 Chronic systolic (congestive) heart failure: Secondary | ICD-10-CM

## 2022-08-31 DIAGNOSIS — D6869 Other thrombophilia: Secondary | ICD-10-CM

## 2022-08-31 LAB — CUP PACEART INCLINIC DEVICE CHECK
Battery Remaining Longevity: 38 mo
Battery Voltage: 2.98 V
Brady Statistic RV Percent Paced: 8.34 %
Date Time Interrogation Session: 20240412184637
HighPow Impedance: 67 Ohm
Implantable Lead Connection Status: 753985
Implantable Lead Implant Date: 20180209
Implantable Lead Location: 753860
Implantable Lead Model: 6935
Implantable Pulse Generator Implant Date: 20180209
Lead Channel Impedance Value: 304 Ohm
Lead Channel Impedance Value: 361 Ohm
Lead Channel Sensing Intrinsic Amplitude: 2.5 mV
Lead Channel Sensing Intrinsic Amplitude: 5 mV
Lead Channel Setting Pacing Amplitude: 2 V
Lead Channel Setting Pacing Pulse Width: 0.5 ms
Lead Channel Setting Sensing Sensitivity: 0.3 mV
Zone Setting Status: 755011

## 2022-08-31 NOTE — Patient Instructions (Addendum)
Medication Instructions:    Your physician recommends that you continue on your current medications as directed. Please refer to the Current Medication list given to you today.   *If you need a refill on your cardiac medications before your next appointment, please call your pharmacy*   Lab Work: BMET TODAY   If you have labs (blood work) drawn today and your tests are completely normal, you will receive your results only by: MyChart Message (if you have MyChart) OR A paper copy in the mail If you have any lab test that is abnormal or we need to change your treatment, we will call you to review the results.   Testing/Procedures:  Your physician has recommended that you wear an event monitor. Event monitors are medical devices that record the heart's electrical activity. Doctors most often [place] these monitors to diagnose arrhythmias. Arrhythmias are problems with the speed or rhythm of the heartbeat. The monitor is a small, portable device. You can wear one while you do your normal daily activities. This is usually used to diagnose what is causing palpitations/syncope (passing out).    Follow-Up: At [facility], you and your health needs are our priority.  As part of our continuing mission to provide you with exceptional heart care, we have created designated Provider Care Teams.  These Care Teams include your primary Cardiologist (physician) and Advanced Practice Providers (APPs -  Physician Assistants and Nurse Practitioners) who all work together to provide you with the care you need, when you need it.  We recommend signing up for the patient portal called "MyChart".  Sign up information is provided on this After Visit Summary.  MyChart is used to connect with patients for Virtual Visits (Telemedicine).  Patients are able to view lab/test results, encounter notes, upcoming appointments, etc.  Non-urgent messages can be sent to your provider as well.   To learn more about what you  can do with MyChart, go to [email or web address].    Your next appointment:   3 month(s)  Provider:   You may see Dr. Ladona Ridgel  or one of the following Advanced Practice Providers on your designated Care Team:   Francis Dowse, [place] "Mardelle Matte" [place], [place] Sherie Don, NP    Other Instructions

## 2022-08-31 NOTE — Progress Notes (Unsigned)
Applied a 3 day Zio XT monitor to patient in the office ? ?Dr Taylor to read ?

## 2022-09-01 LAB — BASIC METABOLIC PANEL
BUN/Creatinine Ratio: 16 (ref 10–24)
BUN: 24 mg/dL (ref 8–27)
CO2: 21 mmol/L (ref 20–29)
Calcium: 10.1 mg/dL (ref 8.6–10.2)
Chloride: 105 mmol/L (ref 96–106)
Creatinine, Ser: 1.53 mg/dL — ABNORMAL HIGH (ref 0.76–1.27)
Glucose: 84 mg/dL (ref 70–99)
Potassium: 4.6 mmol/L (ref 3.5–5.2)
Sodium: 141 mmol/L (ref 134–144)
eGFR: 48 mL/min/{1.73_m2} — ABNORMAL LOW (ref >59)

## 2022-09-11 ENCOUNTER — Ambulatory Visit (INDEPENDENT_AMBULATORY_CARE_PROVIDER_SITE_OTHER): Payer: Medicare PPO

## 2022-09-11 ENCOUNTER — Encounter: Payer: Self-pay | Admitting: Family Medicine

## 2022-09-11 ENCOUNTER — Ambulatory Visit (INDEPENDENT_AMBULATORY_CARE_PROVIDER_SITE_OTHER): Payer: Medicare PPO | Admitting: Family Medicine

## 2022-09-11 VITALS — BP 110/62 | HR 64 | Temp 98.3°F | Wt 116.0 lb

## 2022-09-11 DIAGNOSIS — B370 Candidal stomatitis: Secondary | ICD-10-CM | POA: Insufficient documentation

## 2022-09-11 DIAGNOSIS — I255 Ischemic cardiomyopathy: Secondary | ICD-10-CM

## 2022-09-11 LAB — CUP PACEART REMOTE DEVICE CHECK
Battery Remaining Longevity: 38 mo
Battery Voltage: 2.96 V
Brady Statistic RV Percent Paced: 5.84 %
Date Time Interrogation Session: 20240423001607
HighPow Impedance: 74 Ohm
Implantable Lead Connection Status: 753985
Implantable Lead Implant Date: 20180209
Implantable Lead Location: 753860
Implantable Lead Model: 6935
Implantable Pulse Generator Implant Date: 20180209
Lead Channel Impedance Value: 304 Ohm
Lead Channel Impedance Value: 361 Ohm
Lead Channel Sensing Intrinsic Amplitude: 2.375 mV
Lead Channel Sensing Intrinsic Amplitude: 2.375 mV
Lead Channel Setting Pacing Amplitude: 2 V
Lead Channel Setting Pacing Pulse Width: 0.5 ms
Lead Channel Setting Sensing Sensitivity: 0.3 mV
Zone Setting Status: 755011

## 2022-09-11 MED ORDER — NYSTATIN 100000 UNIT/ML MT SUSP: 200000.0000 [IU] | Freq: Four times a day (QID) | OROMUCOSAL | 1 refills | Status: DC

## 2022-09-11 NOTE — Patient Instructions (Signed)
I prescribed another nystatin suspension for you to use.  Use as directed.  Use it for times a day.  The bottle should last for approximately 1 week.  That is the duration of how long he should use it.  Swish around in your mouth as directed and then swallow it.  If it does not help after a week please let us know and we can look into alternative treatments.  Have a great day,  Frederic Jericho, MD

## 2022-09-11 NOTE — Progress Notes (Signed)
Acute Office Visit  Subjective:     Patient ID: Andrew Townsend, male    DOB: [birth year], 72 y.o.   MRN: [id number]  Chief Complaint  Patient presents with   Mouth Problem      Patient states he had thrush back in October and at that time he was informed this by the dentist after thinking that his dentures needed to be realigned.  This was after starting trilogy inhaler.  The nystatin suspension improved his symptoms.  After that he continue to wash his mouth out after every Trelegy use.  He continues to do that.  The symptoms have been lasting the last for 5 days.  They include bottom gums feeling sore, difficulty swallowing for gulps of water or other fluids.  He has not been able to tell if his mouth appears more red or inflamed due to poor lighting in his bathroom when trying to evaluate himself.    ROS       Objective:    BP 110/62   Pulse 64   Temp 98.3 F (36.8 C) (Oral)   Wt 116 lb 0.1 oz (52.6 kg)   SpO2 94%   BMI 18.72 kg/m    Physical Exam General: Alert and oriented HEENT: Top and bottom dentures in place.  No obvious evidence of fungal infection on the tongue.  No erythema.  No tonsillar hypertrophy or other exudates.  Results for orders placed or performed in visit on 09/11/22  CUP PACEART REMOTE DEVICE CHECK  Result Value Ref Range   Date Time Interrogation Session [id number]    Pulse Generator Manufacturer MERM    Pulse Gen Model [id number] Visia AF MRI VR    Pulse Gen Serial Number [id number]    Clinic Name [facility]    Implantable Pulse Generator Type Implantable Cardiac Defibulator    Implantable Pulse Generator Implant Date [id number]    Implantable Lead Manufacturer [facility]    Implantable Lead Model [id number] Sprint Quattro Secure S    Implantable Lead Serial Number [id number]    Implantable Lead Implant Date [id number]    Implantable Lead Location Detail 1 UNKNOWN    Implantable Lead Location [id number]    Implantable Lead Connection Status [id number]     Lead Channel Setting Sensing Sensitivity 0.3 mV   Lead Channel Setting Pacing Pulse Width 0.5 ms   Lead Channel Setting Pacing Amplitude 2 V   Zone Setting Status Active    Zone Setting Status Inactive    Zone Setting Status Active    Zone Setting Status Inactive    Zone Setting Status [phone]    Lead Channel Impedance Value 361 ohm   Lead Channel Impedance Value 304 ohm   Lead Channel Sensing Intrinsic Amplitude 2.375 mV   Lead Channel Sensing Intrinsic Amplitude 2.375 mV   HighPow Impedance 74 ohm   Battery Status OK    Battery Remaining Longevity 38 mo   Battery Voltage 2.96 V   Brady Statistic RV Percent Paced 5.84 %        Assessment & Plan:   Problem List Items Addressed This Visit       Digestive   Thrush - Primary    No obvious signs of thrush on exam but patient complaining of symptoms similar to the last time he had thrush.  Recommended empiric treatment with nystatin swish and swallow for 1 week and to reevaluate if not improved.        Relevant Medications   nystatin (MYCOSTATIN) 100000 UNIT/ML suspension  Meds ordered this encounter  Medications   nystatin (MYCOSTATIN) 100000 UNIT/ML suspension    Sig: Take 2 mLs (200,000 Units total) by mouth 4 (four) times daily. Apply 1mL to each cheek    Dispense:  60 mL    Refill:  1    Return if symptoms worsen or fail to improve.  Sandre Kitty, MD

## 2022-09-11 NOTE — Assessment & Plan Note (Signed)
No obvious signs of thrush on exam but patient complaining of symptoms similar to the last time he had thrush.  Recommended empiric treatment with nystatin swish and swallow for 1 week and to reevaluate if not improved.

## 2022-09-18 ENCOUNTER — Encounter: Payer: Self-pay | Admitting: Student in an Organized Health Care Education/Training Program

## 2022-09-18 ENCOUNTER — Ambulatory Visit
Payer: Medicare PPO | Attending: Student in an Organized Health Care Education/Training Program | Admitting: Student in an Organized Health Care Education/Training Program

## 2022-09-18 VITALS — BP 141/79 | HR 55 | Temp 98.2°F | Resp 18 | Ht 66.0 in | Wt 118.0 lb

## 2022-09-18 DIAGNOSIS — G629 Polyneuropathy, unspecified: Secondary | ICD-10-CM | POA: Insufficient documentation

## 2022-09-18 DIAGNOSIS — G894 Chronic pain syndrome: Secondary | ICD-10-CM | POA: Diagnosis present

## 2022-09-18 DIAGNOSIS — Z95810 Presence of automatic (implantable) cardiac defibrillator: Secondary | ICD-10-CM | POA: Insufficient documentation

## 2022-09-18 DIAGNOSIS — M47816 Spondylosis without myelopathy or radiculopathy, lumbar region: Secondary | ICD-10-CM | POA: Insufficient documentation

## 2022-09-18 DIAGNOSIS — M4125 Other idiopathic scoliosis, thoracolumbar region: Secondary | ICD-10-CM | POA: Diagnosis present

## 2022-09-18 DIAGNOSIS — I255 Ischemic cardiomyopathy: Secondary | ICD-10-CM | POA: Insufficient documentation

## 2022-09-18 DIAGNOSIS — Z79891 Long term (current) use of opiate analgesic: Secondary | ICD-10-CM | POA: Insufficient documentation

## 2022-09-18 DIAGNOSIS — Z9889 Other specified postprocedural states: Secondary | ICD-10-CM | POA: Insufficient documentation

## 2022-09-18 DIAGNOSIS — M47818 Spondylosis without myelopathy or radiculopathy, sacral and sacrococcygeal region: Secondary | ICD-10-CM | POA: Insufficient documentation

## 2022-09-18 DIAGNOSIS — G5703 Lesion of sciatic nerve, bilateral lower limbs: Secondary | ICD-10-CM | POA: Diagnosis not present

## 2022-09-18 MED ORDER — OXYCODONE-ACETAMINOPHEN 10-325 MG PO TABS: 1.0000 | ORAL_TABLET | Freq: Three times a day (TID) | ORAL | 0 refills | Status: AC | PRN

## 2022-09-18 MED ORDER — BUPRENORPHINE 20 MCG/HR TD PTWK
1.0000 | MEDICATED_PATCH | TRANSDERMAL | 2 refills | Status: DC
Start: 2022-09-28 — End: 2022-12-18

## 2022-09-18 MED ORDER — OXYCODONE-ACETAMINOPHEN 10-325 MG PO TABS
1.0000 | ORAL_TABLET | Freq: Three times a day (TID) | ORAL | 0 refills | Status: DC | PRN
Start: 2022-11-27 — End: 2022-12-18

## 2022-09-18 NOTE — Progress Notes (Signed)
Nursing Pain Medication Assessment:  Safety precautions to be maintained throughout the outpatient stay will include: orient to surroundings, keep bed in low position, maintain call bell within reach at all times, provide assistance with transfer out of bed and ambulation.   Nursing Pain Medication Assessment:  Safety precautions to be maintained throughout the outpatient stay will include: orient to surroundings, keep bed in low position, maintain call bell within reach at all times, provide assistance with transfer out of bed and ambulation.  Medication Inspection Compliance: Pill count conducted under aseptic conditions, in front of the patient. Neither the pills nor the bottle was removed from the patient's sight at any time. Once count was completed pills were immediately returned to the patient in their original bottle.  Medication #1: Oxycodone/APAP Pill/Patch Count:  44 of 90 pills remain Pill/Patch Appearance: Markings consistent with prescribed medication Bottle Appearance: Standard pharmacy container. Clearly labeled. Filled Date: 4 / 70 / 2024 Last Medication intake:  Today  Medication #2: Buprenorphine (Suboxone) Pill/Patch Count:  1 of 4 pills remain Pill/Patch Appearance: Markings consistent with prescribed medication Bottle Appearance: Standard pharmacy container. Clearly labeled. Filled Date: 4 / 6 / 2024 Last Medication intake:  Yesterday

## 2022-09-18 NOTE — Progress Notes (Signed)
PROVIDER NOTE: Information contained herein reflects review and annotations entered in association with encounter. Interpretation of such information and data should be left to medically-trained personnel. Information provided to patient can be located elsewhere in the medical record under "Patient Instructions". Document created using STT-dictation technology, any transcriptional errors that may result from process are unintentional.    Patient: Andrew Townsend  Service Category: E/M  Provider: Edward Jolly, MD  DOB: [birth year]  DOS: 09/18/2022  Referring Provider: Peggye Fothergill  MRN: [id number]  Specialty: Interventional Pain Management  PCP: Melida Quitter, PA  Type: Established Patient  Setting: Ambulatory outpatient    Location: Office  Delivery: Face-to-face     HPI  Mr. Raydin Bielinski, a 72 y.o. year old male, is here today because of his Neuropathy [G62.9]. Mr. Hinch primary complain today is Back Pain (low) Last encounter: My last encounter with him was on 06/28/22 Pertinent problems: Mr. 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 Pain Assessment: Severity of Chronic pain is reported as a 7 /10. Location: Back Lower/buttocks and legs posteriorly. Onset: More than a month ago. Quality: Constant, Sharp, Nagging. Timing: Constant. Modifying factor(s): walking, medications, rest, heat. Vitals:  height is 5\' 6"  (1.676 m) and weight is 118 lb (53.5 kg). His temporal temperature is 98.2 F (36.8 C). His blood pressure is 141/79 (abnormal) and his pulse  is 55 (abnormal). His respiration is 18 and oxygen saturation is 100%.   Reason for encounter: medication management.  No significant change in Naziah's medical history since his last visit with me. Patient's pain is at baseline.  Patient continues multimodal pain regimen as prescribed.  States that it provides pain relief and improvement in functional status.  HPI from initial clinic note: Jovoni is a pleasant 72 year old male who has moved from [facility] hoping to establish with pain management.  Patient has a history of bilateral shoulder pain status post left rotator cuff surgery x1, right rotator cuff surgery x2, low back pain related to lumbar spinal stenosis and lumbar degenerative disc disease and lumbar facet arthropathy.  He has been on chronic opioid therapy which includes Butrans patch at 15 mcg an hour along with Percocet 10 mg 3 times daily as needed.  He also takes gabapentin 300 mg nightly which she is hoping to increase the dose of.  He has difficulty ambulating.  He states that he has to sit down and rest given weakness and pain in bilateral legs.  He also has a history of coronary artery disease status post CABG x4.  He has a ICD in place along with COPD, stage IV chronic kidney disease for which she needs to establish with nephrologist.  He has a right renal artery stent in place.  He is complaining of bilateral hip pain, SI joint pain as well as bilateral knee pain.  He is on Eliquis for his cardiac disease.  Of note, he has had many injections done in the past at his previous pain clinic including diagnostic lumbar facet medial branch nerve blocks, subsequent lumbar radiofrequency ablations with limited response.  He is also had multiple epidurals with limited response as well.  He is open  to interventional options for pain management.    Pharmacotherapy Assessment  Analgesic: Butrans patch, 20 mcg an hour, Percocet 10 mg 3 times daily as needed  Monitoring: [place] PMP:  PDMP not reviewed this encounter.       Pharmacotherapy: No side-effects or adverse reactions reported. Compliance: No problems identified. Effectiveness: Clinically acceptable.  [name], RN  09/18/2022  1:30 PM  Sign when Signing Visit Nursing Pain Medication Assessment:  Safety precautions to be maintained throughout the outpatient stay will include: orient to surroundings, keep bed in low position, maintain call bell within reach at all times, provide assistance with transfer out of bed and ambulation.   Nursing Pain Medication Assessment:  Safety precautions to be maintained throughout the outpatient stay will include: orient to surroundings, keep bed in low position, maintain call bell within reach at all times, provide assistance with transfer out of bed and ambulation.  Medication Inspection Compliance: Pill count conducted under aseptic conditions, in front of the patient. Neither the pills nor the bottle was removed from the patient's sight at any time. Once count was completed pills were immediately returned to the patient in their original bottle.  Medication #1: Oxycodone/APAP Pill/Patch Count:  44 of 90 pills remain Pill/Patch Appearance: Markings consistent with prescribed medication Bottle Appearance: Standard pharmacy container. Clearly labeled. Filled Date: 4 / 47 / 2024 Last Medication intake:  Today  Medication #2: Buprenorphine (Suboxone) Pill/Patch Count:  1 of 4 pills remain Pill/Patch Appearance: Markings consistent with prescribed medication Bottle Appearance: Standard pharmacy container. Clearly labeled. Filled Date: 4 / 70 / 2024 Last Medication intake:  Yesterday    No results found for: "CBDTHCR" No results found for: "D8THCCBX" No results found for: "D9THCCBX"  UDS:  Summary  Date Value Ref Range Status  12/07/2021 Note  Final    Comment:    ==================================================================== Compliance Drug Analysis,  Ur ==================================================================== Test                             Result       Flag       Units  Drug Present and Declared for Prescription Verification   Oxycodone                      6752         EXPECTED   ng/mg creat   Oxymorphone                    1668         EXPECTED   ng/mg creat   Noroxycodone                   6775         EXPECTED   ng/mg creat   Noroxymorphone                 719          EXPECTED   ng/mg creat    Sources of oxycodone are scheduled prescription medications.    Oxymorphone, noroxycodone, and noroxymorphone are expected    metabolites of oxycodone. Oxymorphone is also available as a    scheduled prescription medication.    Buprenorphine                  7            EXPECTED   ng/mg creat   Norbuprenorphine  9            EXPECTED   ng/mg creat    Source of buprenorphine is a scheduled prescription medication.    Norbuprenorphine is an expected metabolite of buprenorphine.    Gabapentin                     PRESENT      EXPECTED   Tizanidine                     PRESENT      EXPECTED   Zopiclone/Eszopiclone          PRESENT      EXPECTED    Eszopiclone is detected as zopiclone (racemic).    Acetaminophen                  PRESENT      EXPECTED   Metoprolol                     PRESENT      EXPECTED  Drug Present not Declared for Prescription Verification   Ibuprofen                      PRESENT      UNEXPECTED ==================================================================== Test                      Result    Flag   Units      Ref Range   Creatinine              75               mg/dL      >=16 ==================================================================== Declared Medications:  The flagging and interpretation on this report are based on the  following declared medications.  Unexpected results may arise from  inaccuracies in the declared medications.   **Note: The testing scope of this panel  includes these medications:   Gabapentin (Neurontin)  Metoprolol (Toprol)  Oxycodone (Percocet)   **Note: The testing scope of this panel does not include small to  moderate amounts of these reported medications:   Acetaminophen (Percocet)  Buprenorphine Patch (BuTrans)  Eszopiclone (Lunesta)  Tizanidine (Zanaflex)   **Note: The testing scope of this panel does not include the  following reported medications:   Albuterol (Ventolin HFA)  Allopurinol (Zyloprim)  Apixaban (Eliquis)  Ezetimibe (Zetia)  Fluticasone (Trelegy)  Furosemide (Lasix)  Nitroglycerin (Nitrostat)  Omeprazole (Prilosec)  Oxybutynin (Ditropan)  Potassium Chloride  Rosuvastatin (Crestor)  Umeclidinium (Trelegy)  Vilanterol (Trelegy) ==================================================================== For clinical consultation, please call [phone]. ====================================================================       ROS  Constitutional: Denies any fever or chills Gastrointestinal: No reported hemesis, hematochezia, vomiting, or acute GI distress Musculoskeletal:  Low back, bilateral SI joint and piriformis pain Neurological: No reported episodes of acute onset apraxia, aphasia, dysarthria, agnosia, amnesia, paralysis, loss of coordination, or loss of consciousness  Medication Review  Fluticasone-Umeclidin-Vilant, Magnesium Oxide, albuterol, allopurinol, apixaban, buprenorphine, empagliflozin, ezetimibe, furosemide, gabapentin, metoprolol succinate, multivitamin, nitroGLYCERIN, nystatin, omeprazole, oxyCODONE-acetaminophen, oxybutynin, potassium chloride, rosuvastatin, tiZANidine, and zolpidem  History Review  Allergy: Mr. Berkovich has No Known Allergies. Drug: Mr. Grochowski  reports no history of drug use. Alcohol:  has no history on file for alcohol use. Tobacco:  reports that he has been smoking e-cigarettes. He has never used smokeless tobacco. Social: Mr. Ariola  reports that he has been  smoking e-cigarettes. He has never  used smokeless tobacco. He reports that he does not use drugs. Medical:  has a past medical history of Arthritis, Blood transfusion without reported diagnosis, CAD (coronary artery disease), Cataract, Chronic kidney disease, COPD (chronic obstructive pulmonary disease) (HCC), GERD (gastroesophageal reflux disease), Hyperlipidemia, Hypertension, 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), Myocardial infarction (HCC), and Stroke (HCC). Surgical: Mr. Tuller  has a past surgical history that includes Coronary artery bypass graft and Hernia repair. Family: family history is not on file.  Laboratory Chemistry Profile   Renal Lab Results  Component Value Date   BUN 24 08/31/2022   CREATININE 1.53 (H) 08/31/2022   BCR 16 08/31/2022    Hepatic Lab Results  Component Value Date   AST 24 07/04/2022   ALT 21 07/04/2022   ALBUMIN 4.7 07/04/2022   ALKPHOS 95 07/04/2022    Electrolytes Lab Results  Component Value Date   NA 141 08/31/2022   K 4.6 08/31/2022   CL 105 08/31/2022   CALCIUM 10.1 08/31/2022   MG 2.1 07/31/2022    Bone No results found for: "VD25OH", "VD125OH2TOT", "ZO1096EA5", "WU9811BJ4", "25OHVITD1", "25OHVITD2", "25OHVITD3", "TESTOFREE", "TESTOSTERONE"  Inflammation (CRP: Acute Phase) (ESR: Chronic Phase) Lab Results  Component Value Date   CRP 18 (H) 06/26/2022   ESRSEDRATE 6 06/26/2022         Note: Above Lab results reviewed.  Sacroiliac Joint Imaging: Sacroiliac Joint DG: Results for orders placed during the hospital encounter of 12/07/21   DG Si Joints   Narrative CLINICAL DATA:  Sacroiliac joint pain. Chronic lower back pain radiating to the bilateral legs.   EXAM: BILATERAL SACROILIAC JOINTS - 3+ VIEW   COMPARISON:  None Available.   FINDINGS: There is diffuse decreased bone mineralization. Mild mid to superior bilateral sacroiliac subchondral sclerosis and joint space narrowing. No  subchondral erosions.   Mild bilateral femoroacetabular joint space narrowing. Moderate to severe left L4-5 and L5-S1 disc space narrowing. Hernia mesh coils overlie the pelvis. No acute fracture or dislocation.   IMPRESSION: Mild bilateral sacroiliac osteoarthritis.     Electronically Signed By: Neita Garnet M.D. On: 12/08/2021 11:12         Narrative CLINICAL DATA:  Chronic bilateral hip pain.   EXAM: DG HIP (WITH OR WITHOUT PELVIS) 2-3V RIGHT; DG HIP (WITH OR WITHOUT PELVIS) 2-3V LEFT   COMPARISON:  None Available.   FINDINGS: Mild bilateral sacroiliac joint space narrowing and subchondral sclerosis. Mild bilateral superolateral acetabular degenerative osteophytes. Mild bilateral femoroacetabular joint space narrowing. The pubic symphysis joint space is maintained. Diffuse decreased bone mineralization. No acute fracture is seen. No dislocation. Mild-to-moderate vascular calcifications. Hernia mesh coils overlie the pelvis.   IMPRESSION: Mild bilateral femoroacetabular osteoarthritis.     Electronically Signed By: Neita Garnet M.D. On: 12/08/2021 11:24   Narrative CLINICAL DATA:  Chronic bilateral hip pain.   EXAM: DG HIP (WITH OR WITHOUT PELVIS) 2-3V RIGHT; DG HIP (WITH OR WITHOUT PELVIS) 2-3V LEFT   COMPARISON:  None Available.   FINDINGS: Mild bilateral sacroiliac joint space narrowing and subchondral sclerosis. Mild bilateral superolateral acetabular degenerative osteophytes. Mild bilateral femoroacetabular joint space narrowing. The pubic symphysis joint space is maintained. Diffuse decreased bone mineralization. No acute fracture is seen. No dislocation. Mild-to-moderate vascular calcifications. Hernia mesh coils overlie the pelvis.   IMPRESSION: Mild bilateral femoroacetabular osteoarthritis.     Electronically Signed By: Neita Garnet M.D. On: 12/08/2021 11:24    Physical Exam  General appearance: Well nourished, well developed, and  well hydrated. In no apparent  acute distress Mental status: Alert, oriented x 3 (person, place, & time)       Respiratory: No evidence of acute respiratory distress Eyes: PERLA Vitals: BP (!) 141/79   Pulse (!) 55   Temp 98.2 F (36.8 C) (Temporal)   Resp 18   Ht 5\' 6"  (1.676 m)   Wt 118 lb (53.5 kg)   SpO2 100%   BMI 19.05 kg/m  BMI: Estimated body mass index is 19.05 kg/m as calculated from the following:   Height as of this encounter: 5\' 6"  (1.676 m).   Weight as of this encounter: 118 lb (53.5 kg). Ideal: Ideal body weight: 63.8 kg (140 lb 10.5 oz)  Lumbar Spine Area Exam  Skin & Axial Inspection: No masses, redness, or swelling Alignment: Symmetrical Functional ROM: Pain restricted ROM       Stability: No instability detected Muscle Tone/Strength: Functionally intact. No obvious neuro-muscular anomalies detected. Sensory (Neurological): Dermatomal pain pattern and musculoskeletal Palpation: No palpable anomalies       Provocative Tests: Hyperextension/rotation test: deferred today       Lumbar quadrant test (Kemp's test): deferred today       Lateral bending test: deferred today       Patrick's Maneuver: (+) for bilateral S-I arthralgia             FABER* test: +) for bilateral S-I arthralgia                   Gait & Posture Assessment  Ambulation: Unassisted Gait: Relatively normal for age and body habitus Posture: WNL  Lower Extremity Exam      Side: Right lower extremity   Side: Left lower extremity  Stability: No instability observed           Stability: No instability observed          Skin & Extremity Inspection: Skin color, temperature, and hair growth are WNL. No peripheral edema or cyanosis. No masses, redness, swelling, asymmetry, or associated skin lesions. No contractures.   Skin & Extremity Inspection: Skin color, temperature, and hair growth are WNL. No peripheral edema or cyanosis. No masses, redness, swelling, asymmetry, or associated skin lesions. No  contractures.  Functional ROM: Pain restricted ROM for hip and knee joints           Functional ROM: Pain restricted ROM for hip and knee joints          Muscle Tone/Strength: Functionally intact. No obvious neuro-muscular anomalies detected.   Muscle Tone/Strength: Functionally intact. No obvious neuro-muscular anomalies detected.  Sensory (Neurological): Musculoskeletal pain pattern         Sensory (Neurological): Musculoskeletal pain pattern        DTR: Patellar: deferred today Achilles: deferred today Plantar: deferred today   DTR: Patellar: deferred today Achilles: deferred today Plantar: deferred today  Palpation: No palpable anomalies   Palpation: No palpable anomalies     Assessment   Diagnosis Status  1. Neuropathy   2. 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 Piriformis syndrome of both sides   4. Lumbar facet arthropathy   5. Other idiopathic scoliosis, thoracolumbar region   6. Encounter for long-term opiate analgesic use   7. Hx of shoulder surgery (left rotator cuff surgery, right rotator cuff then reverse total shoulder arthoplasty)   8. 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)   9. Chronic pain syndrome     Controlled Controlled Controlled   Plan of Care   Mr. Kaj  Widmer has a current medication list which includes the following long-term medication(s): albuterol, albuterol, allopurinol, apixaban, ezetimibe, furosemide, gabapentin, metoprolol succinate, nitroglycerin, omeprazole, potassium chloride, rosuvastatin, tizanidine, and zolpidem.  Pharmacotherapy (Medications Ordered): Meds ordered this encounter  Medications   oxyCODONE-acetaminophen (PERCOCET) 10-325 MG tablet    Sig: Take 1 tablet by mouth every 8 (eight) hours as needed for pain.    Dispense:  75 tablet    Refill:  0   oxyCODONE-acetaminophen (PERCOCET) 10-325 MG tablet    Sig: Take 1 tablet by mouth every 8 (eight) hours as needed for pain.     Dispense:  90 tablet    Refill:  0   oxyCODONE-acetaminophen (PERCOCET) 10-325 MG tablet    Sig: Take 1 tablet by mouth every 8 (eight) hours as needed for pain.    Dispense:  90 tablet    Refill:  0   buprenorphine (BUTRANS) 20 MCG/HR PTWK    Sig: Place 1 patch onto the skin once a week.    Dispense:  4 patch    Refill:  2  Continue Gabapentin and Tizanidine prn, no refills needed Patient going on vacation to [place], requesting to refill his medications on 5/10 prior to him leaving 5/11. That is fine but we will decrease quantity from 90-->75 for the next Rx since he'll be filling 5 days earlier and then back to normal dosing/quantity    Orders:  No orders of the defined types were placed in this encounter.  Follow-up plan:   No follow-ups on file.     B/L SI joint and piriformis 01/03/2022, 02/07/22   Recent Visits Date Type Provider Dept  06/28/22 Office Visit Edward Jolly, MD [facility]  Showing recent visits within past 90 days and meeting all other requirements Today's Visits Date Type Provider Dept  09/18/22 Office Visit Edward Jolly, MD [facility]  Showing today's visits and meeting all other requirements Future Appointments No visits were found meeting these conditions. Showing future appointments within next 90 days and meeting all other requirements  I discussed the assessment and treatment plan with the patient. The patient was provided an opportunity to ask questions and all were answered. The patient agreed with the plan and demonstrated an understanding of the instructions.  Patient advised to call back or seek an in-person evaluation if the symptoms or condition worsens.  Duration of encounter: .  Total time on encounter, as per AMA guidelines included both the face-to-face and non-face-to-face time personally spent by the physician and/or other qualified health care professional(s) on the day of the encounter (includes time in  activities that require the physician or other qualified health care professional and does not include time in activities normally performed by clinical staff). Physician's time may include the following activities when performed: preparing to see the patient (eg, review of tests, pre-charting review of records) obtaining and/or reviewing separately obtained history performing a medically appropriate examination and/or evaluation counseling and educating the patient/family/caregiver ordering medications, tests, or procedures referring and communicating with other health care professionals (when not separately reported) documenting clinical information in the electronic or other health record independently interpreting results (not separately reported) and communicating results to the patient/ family/caregiver care coordination (not separately reported)  Note by: Edward Jolly, MD Date: 09/18/2022; Time: 2:09 PM

## 2022-10-09 NOTE — Progress Notes (Signed)
Remote ICD transmission.   

## 2022-10-16 ENCOUNTER — Other Ambulatory Visit: Payer: Self-pay | Admitting: Internal Medicine

## 2022-10-16 NOTE — Telephone Encounter (Signed)
Pt last saw Francis Dowse, [place] on 08/31/22, last labs 08/31/22 Creat 1.53, age 72, weight 53.5kg, based on specified criteria pt is on appropriate dosage of Eliquis 2.5mg  BID for afib.  Will refill rx.

## 2022-10-17 ENCOUNTER — Encounter (HOSPITAL_BASED_OUTPATIENT_CLINIC_OR_DEPARTMENT_OTHER): Payer: Self-pay | Admitting: Cardiology

## 2022-10-17 ENCOUNTER — Ambulatory Visit (HOSPITAL_BASED_OUTPATIENT_CLINIC_OR_DEPARTMENT_OTHER): Payer: Medicare PPO | Admitting: Cardiology

## 2022-10-17 VITALS — BP 138/82 | HR 50 | Ht 66.0 in | Wt 117.9 lb

## 2022-10-17 DIAGNOSIS — I2510 Atherosclerotic heart disease of native coronary artery without angina pectoris: Secondary | ICD-10-CM

## 2022-10-17 DIAGNOSIS — I10 Essential (primary) hypertension: Secondary | ICD-10-CM

## 2022-10-17 DIAGNOSIS — Z95810 Presence of automatic (implantable) cardiac defibrillator: Secondary | ICD-10-CM

## 2022-10-17 DIAGNOSIS — I701 Atherosclerosis of renal artery: Secondary | ICD-10-CM

## 2022-10-17 DIAGNOSIS — D6869 Other thrombophilia: Secondary | ICD-10-CM

## 2022-10-17 DIAGNOSIS — I493 Ventricular premature depolarization: Secondary | ICD-10-CM

## 2022-10-17 DIAGNOSIS — I255 Ischemic cardiomyopathy: Secondary | ICD-10-CM

## 2022-10-17 DIAGNOSIS — I480 Paroxysmal atrial fibrillation: Secondary | ICD-10-CM | POA: Diagnosis not present

## 2022-10-17 DIAGNOSIS — N1831 Chronic kidney disease, stage 3a: Secondary | ICD-10-CM

## 2022-10-17 NOTE — Progress Notes (Signed)
Cardiology Office Note:    Date:  10/17/2022   ID:  Andrew Townsend, DOB May 20, 1951, MRN [id number]  PCP:  Melida Quitter, PA  Cardiologist:  Jodelle Red, MD  Referring MD: Mayer Masker, PA-C   CC: follow up  History of Present Illness:    Andrew Townsend is a 72 y.o. male with a hx of hypertension, hyperlipidemia, myocardial infarction, COPD, CKD, neuropathy, BPH, and gout, who is seen for follow up today. I met him 06/19/21 as a new consult at the request of Mayer Masker, PA-C for the evaluation and management of atherosclerosis. Previously followed by cardiology in [place], [place].  Cardiovascular risk factors: Prior clinical ASCVD: MI s/p angioplasty. ICD in place. Has never felt his device activate. History of renal arterial stent. Comorbid conditions: Hypertension, Hyperlipidemia,  Saw Dr. Ladona Ridgel, device noted increased fluid, improved with increased dose of diuretic. Started on apixaban for afib detected on his device. He is not sure if he has ever felt afib.   Today: Notes that his blood pressure has been labile. Has seen more higher numbers recently. Lowest he has seen has been 118 systolic. Feels no different between the higher and lower readings. Metoprolol (and furosemide PRN) are only antihypertensives currently.   Discussed echo results, TAA of 42 mm. Does not recall being told his aorta was enlarged in the past.   Past Medical History:  Diagnosis Date   Arthritis    Blood transfusion without reported diagnosis    CAD (coronary artery disease)    Cataract    Chronic kidney disease    COPD (chronic obstructive pulmonary disease) (HCC)    GERD (gastroesophageal reflux disease)    Hyperlipidemia    Hypertension    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)    Myocardial infarction (HCC)    Stroke [facility])     Past Surgical History:  Procedure Laterality Date   CORONARY ARTERY BYPASS GRAFT     HERNIA REPAIR      Current  Medications: Current Outpatient Medications on File Prior to Visit  Medication Sig   albuterol (PROVENTIL) (2.5 MG/3ML) 0.083% nebulizer solution INHALE THE CONTENTS OF 1 VIAL VIA NEBULIZER EVERY 6 HOURS AS NEEDED FOR WHEEZING OR SHORTNESS OF BREATH.   albuterol (VENTOLIN HFA) 108 (90 Base) MCG/ACT inhaler Inhale 2 puffs into the lungs every 6 (six) hours as needed for wheezing or shortness of breath.   allopurinol (ZYLOPRIM) 100 MG tablet Take 1 tablet (100 mg total) by mouth daily.   apixaban (ELIQUIS) 2.5 MG TABS tablet TAKE 1 TABLET BY MOUTH 2 TIMES DAILY.   buprenorphine (BUTRANS) 20 MCG/HR PTWK Place 1 patch onto the skin once a week.   empagliflozin (JARDIANCE) 10 MG TABS tablet Take 10 mg by mouth daily.   ezetimibe (ZETIA) 10 MG tablet Take 1 tablet (10 mg total) by mouth daily.   Fluticasone-Umeclidin-Vilant (TRELEGY ELLIPTA) 100-62.5-25 MCG/ACT AEPB Inhale 1 puff into the lungs daily.   furosemide (LASIX) 40 MG tablet Take 1 tablet (40 mg total) by mouth daily as needed for fluid.   gabapentin (NEURONTIN) 300 MG capsule Take 2 capsules (600 mg total) by mouth at bedtime.   Magnesium Oxide (DIASENSE MAGNESIUM PO) Take 800 mg by mouth daily at 6 (six) AM.   metoprolol succinate (TOPROL-XL) 100 MG 24 hr tablet TAKE 1 AND 1/2 TABLETS (150 MG TOTAL) BY MOUTH DAILY.   Multiple Vitamin (MULTIVITAMIN) tablet Take 1 tablet by mouth daily.   nitroGLYCERIN (NITROSTAT) 0.4 MG SL tablet Place  1 tablet (0.4 mg total) under the tongue every 5 (five) minutes as needed for chest pain.   nystatin (MYCOSTATIN) 100000 UNIT/ML suspension Take 2 mLs (200,000 Units total) by mouth 4 (four) times daily. Apply 1mL to each cheek   omeprazole (PRILOSEC) 40 MG capsule TAKE 1 CAPSULE (40 MG TOTAL) BY MOUTH DAILY. NEED OFFICE VISIT FOR REFILLS   oxybutynin (DITROPAN-XL) 10 MG 24 hr tablet TAKE 1 TABLET (10 MG TOTAL) BY MOUTH DAILY.   oxyCODONE-acetaminophen (PERCOCET) 10-325 MG tablet Take 1 tablet by mouth every 8  (eight) hours as needed for pain.   [START ON 10/28/2022] oxyCODONE-acetaminophen (PERCOCET) 10-325 MG tablet Take 1 tablet by mouth every 8 (eight) hours as needed for pain.   [START ON 11/27/2022] oxyCODONE-acetaminophen (PERCOCET) 10-325 MG tablet Take 1 tablet by mouth every 8 (eight) hours as needed for pain.   potassium chloride (KLOR-CON M) 10 MEQ tablet Take 10 mEq by mouth daily.   rosuvastatin (CRESTOR) 40 MG tablet Take 1 tablet (40 mg total) by mouth at bedtime.   tiZANidine (ZANAFLEX) 4 MG tablet Take 1 tablet (4 mg total) by mouth every 8 (eight) hours as needed.   zolpidem (AMBIEN) 5 MG tablet Take 1 tablet (5 mg total) by mouth at bedtime as needed for sleep.   No current facility-administered medications on file prior to visit.     Allergies:   Patient has no known allergies.   Social History   Tobacco Use   Smoking status: Every Day    Types: E-cigarettes   Smokeless tobacco: Never  Vaping Use   Vaping Use: Some days  Substance Use Topics   Drug use: Never    Family History: No significant family history of heart disease  ROS:   Please see the history of present illness.   Additional pertinent ROS otherwise unremarkable.  EKGs/Labs/Other Studies Reviewed:    The following studies were reviewed today: Echo 08/24/22 1. Left ventricular ejection fraction, by estimation, is 50 to 55%. Left  ventricular ejection fraction by 3D volume is 52 %. The left ventricle has  low normal function. The left ventricle has no regional wall motion  abnormalities. There is mild left  ventricular hypertrophy of the basal-septal segment. Left ventricular  diastolic parameters are indeterminate.   2. Right ventricular systolic function is normal. The right ventricular  size is mildly enlarged.   3. Left atrial size was severely dilated.   4. Right atrial size was severely dilated.   5. The mitral valve is normal in structure. Mild mitral valve  regurgitation. No evidence of mitral  stenosis.   6. The aortic valve is normal in structure. Aortic valve regurgitation is  not visualized. No aortic stenosis is present.   7. Aortic dilatation noted. There is mild dilatation of the aortic root,  measuring 42 mm.   8. The inferior vena cava is normal in size with greater than 50%  respiratory variability, suggesting right atrial pressure of 3 mmHg.   CT Chest 05/17/2021: FINDINGS: Cardiovascular: Heart is upper limits of normal in size. No pericardial effusion. Three-vessel coronary artery calcifications status post CABG. Atherosclerotic disease of the thoracic aorta. 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   Mediastinum/Nodes: No pathologically enlarged lymph nodes seen in the chest. Small hiatal hernia.   Lungs/Pleura: Central airways are patent. Bilateral bronchial wall thickening. Bilateral consolidations with adjacent nodularity, most pronounced in the right upper lobe. No pleural effusion or pneumothorax.   Upper Abdomen: No  acute abnormality.   Musculoskeletal: Prior right shoulder arthroplasty.   IMPRESSION: 1. Bilateral consolidations with adjacent nodularity, most pronounced in the posterior right upper lobe, findings are likely sequela of infection or aspiration. Follow-up chest CT is recommended in 2-3 months to ensure resolution. 2. Bilateral bronchial wall thickening, findings can be seen in the setting of bronchitis. 3. Aortic Atherosclerosis (ICD10-I70.0) and Emphysema (ICD10-J43.9).  Echo 03/27/2021 ([facility]): Left Ventricle:  The left ventricular chamber size is normal. There is no left  ventricular hypertrophy. There is mildly decreased left ventricular  systolic function. The estimated ejection fraction is 45-50%.  There is  abnormal ventricular septal wall motion consistent with right  ventricular pacemaker.  Abnormal left ventricular diastolic function is  observed.   Conclusions:                 Mildly depressed LV function.  Maybe inferior base hypokinesis but not  seen well.  No significant valve disease.   Cardiac Catheterization 08/05/2020 ([facility]): Findings:  --LM: Distal LM has a 95% stenosis  --LAD: The proximal LAD has diffuse moderate-severe disease. The mid LAD is 100% occluded. The LIMA-LAD is patent. The distal LAD has mild diffuse disease. The SVG-diagonal is known to be occluded and was not injected.  --LCx: The proximal LCx is 100% occluded. The SVG-OM is widely patent. The distal OM territory it serves has only minimal luminal disease.  --RCA: Not engaged. Known to be 100% occluded in the proximal segment. The SVG-PDA is patent but has TIMI II flow. There are R-L collaterals.   Intervention: none   EKG:  EKG is personally reviewed.   10/17/22: sinus with intermittent V paced beats 04/16/2022:  sinus bradycardia at 52 bpm, IVCB (LBBB pattern) 01/09/2022:  EKG was not ordered. 06/19/2021: NSR, IVCB at 69 bpm  Recent Labs: 07/04/2022: ALT 21; Hemoglobin 12.9; Platelets 171; TSH 3.050 07/31/2022: Magnesium 2.1 08/31/2022: BUN 24; Creatinine, Ser 1.53; Potassium 4.6; Sodium 141   Recent Lipid Panel    Component Value Date/Time   CHOL 113 07/04/2022 0923   TRIG 63 07/04/2022 0923   HDL 44 07/04/2022 0923   CHOLHDL 2.6 07/04/2022 0923   LDLCALC 55 07/04/2022 0923    Physical Exam:    VS:  BP 138/82   Pulse (!) 50   Ht 5\' 6"  (1.676 m)   Wt 117 lb 14.4 oz (53.5 kg)   BMI 19.03 kg/m     Wt Readings from Last 3 Encounters:  11/07/22 116 lb 4 oz (52.7 kg)  10/17/22 117 lb 14.4 oz (53.5 kg)  09/18/22 118 lb (53.5 kg)    GEN: Well nourished, well developed in no acute distress HEENT: Normal, moist mucous membranes NECK: No JVD CARDIAC: regular rhythm, normal S1 and S2, no rubs or gallops. No murmur. VASCULAR: Radial and DP pulses 2+ bilaterally. No carotid bruits RESPIRATORY:  Clear to auscultation without rales, wheezing or rhonchi   ABDOMEN: Soft, non-tender, non-distended MUSCULOSKELETAL:  Ambulates independently SKIN: Warm and dry, no edema NEUROLOGIC:  Alert and oriented x 3. No focal neuro deficits noted. PSYCHIATRIC:  Normal affect    ASSESSMENT:    1. Ischemic cardiomyopathy   2. Cardiac defibrillator in situ   3. Paroxysmal atrial fibrillation (HCC)   4. PVC (premature ventricular contraction)   5. Coronary artery disease involving native coronary artery of native heart without angina pectoris   6. Primary hypertension   7. Secondary hypercoagulable state (HCC)   8. Stage 3a  chronic kidney disease (HCC)   9. Renal artery stenosis (HCC)      PLAN:    CAD with prior 4V CABG Ischemic cardiomyopathy Chronic systolic and diastolic heart failure -appears euvolemic today -has lasix 40 mg PRN, taking about 4 times in the last two months for leg swelling -continue metoprolol succinate 150 mg daily -now on apixaban, so no aspirin -on ezetimibe, rosuvastatin -Not currently seeing nephrologist  Stage 3a chronic kidney disease Renal artery stenosis, right, s/p right renal artery stent Hypertension -last Cr 1.53 -previously borderline low BP and amlodipine stopped due to edema. Reports excellent control at home. Monitor home BP, if >130/80 consistently he will contact me  History of VT, EP study 06/28/2016 S/P ICD Paroxysmal atrial fibrillation -followed by Dr. Ladona Ridgel -continue metoprolol succinate, taking 150 mg daily CHA2DS2/VAS Stroke Risk Points =3 -on apixaban, reduce dose based on body weight and Cr  Cardiac risk counseling and prevention recommendations: -recommend heart healthy/Mediterranean diet, with whole grains, fruits, vegetable, fish, lean meats, nuts, and olive oil. Limit salt. -recommend moderate walking, 3-5 times/week for 30-50 minutes each session. Aim for at least 150 minutes.week. Goal should be pace of 3 miles/hours, or walking 1.5 miles in 30 minutes -recommend avoidance of  tobacco products. Avoid excess alcohol.  Plan for follow up: 6 months or sooner as needed.  Jodelle Red, MD, PhD, [facility]  [facility]    Signed, Jodelle Red, MD PhD 10/17/2022     [facility]

## 2022-10-17 NOTE — Patient Instructions (Signed)
Medication Instructions:  Your physician recommends that you continue on your current medications as directed. Please refer to the Current Medication list given to you today.   *If you need a refill on your cardiac medications before your next appointment, please call your pharmacy*  Lab Work: NONE  Testing/Procedures: NONE   Follow-Up: At [facility], you and your health needs are our priority.  As part of our continuing mission to provide you with exceptional heart care, we have created designated Provider Care Teams.  These Care Teams include your primary Cardiologist (physician) and Advanced Practice Providers (APPs -  Physician Assistants and Nurse Practitioners) who all work together to provide you with the care you need, when you need it.  We recommend signing up for the patient portal called "MyChart".  Sign up information is provided on this After Visit Summary.  MyChart is used to connect with patients for Virtual Visits (Telemedicine).  Patients are able to view lab/test results, encounter notes, upcoming appointments, etc.  Non-urgent messages can be sent to your provider as well.   To learn more about what you can do with MyChart, go to [email or web address].    Your next appointment:   6 month(s)  The format for your next appointment:   In Person  Provider:   Bridgette Christopher, MD             

## 2022-11-07 ENCOUNTER — Encounter: Payer: Self-pay | Admitting: Family Medicine

## 2022-11-07 ENCOUNTER — Ambulatory Visit: Payer: Medicare PPO | Admitting: Family Medicine

## 2022-11-07 VITALS — BP 132/64 | HR 56 | Temp 97.4°F | Ht 66.0 in | Wt 116.2 lb

## 2022-11-07 DIAGNOSIS — G4700 Insomnia, unspecified: Secondary | ICD-10-CM

## 2022-11-07 DIAGNOSIS — E785 Hyperlipidemia, unspecified: Secondary | ICD-10-CM | POA: Diagnosis not present

## 2022-11-07 DIAGNOSIS — N1832 Chronic kidney disease, stage 3b: Secondary | ICD-10-CM

## 2022-11-07 DIAGNOSIS — M79645 Pain in left finger(s): Secondary | ICD-10-CM

## 2022-11-07 DIAGNOSIS — M79644 Pain in right finger(s): Secondary | ICD-10-CM

## 2022-11-07 DIAGNOSIS — M1A39X0 Chronic gout due to renal impairment, multiple sites, without tophus (tophi): Secondary | ICD-10-CM

## 2022-11-07 DIAGNOSIS — I10 Essential (primary) hypertension: Secondary | ICD-10-CM

## 2022-11-07 DIAGNOSIS — J449 Chronic obstructive pulmonary disease, unspecified: Secondary | ICD-10-CM | POA: Diagnosis not present

## 2022-11-07 MED ORDER — EZETIMIBE 10 MG PO TABS
10.0000 mg | ORAL_TABLET | Freq: Every day | ORAL | 1 refills | Status: DC
Start: 2022-11-07 — End: 2023-05-09

## 2022-11-07 MED ORDER — ALLOPURINOL 100 MG PO TABS
100.0000 mg | ORAL_TABLET | Freq: Every day | ORAL | 1 refills | Status: DC
Start: 2022-11-07 — End: 2023-05-09

## 2022-11-07 MED ORDER — TRELEGY ELLIPTA 100-62.5-25 MCG/ACT IN AEPB
1.0000 | INHALATION_SPRAY | Freq: Every day | RESPIRATORY_TRACT | 1 refills | Status: DC
Start: 2022-11-07 — End: 2023-10-23

## 2022-11-07 MED ORDER — ROSUVASTATIN CALCIUM 40 MG PO TABS
40.0000 mg | ORAL_TABLET | Freq: Every day | ORAL | 1 refills | Status: DC
Start: 2022-11-07 — End: 2023-05-09

## 2022-11-07 MED ORDER — METOPROLOL SUCCINATE ER 100 MG PO TB24
150.0000 mg | ORAL_TABLET | Freq: Every day | ORAL | 1 refills | Status: DC
Start: 2022-11-07 — End: 2023-05-09

## 2022-11-07 NOTE — Assessment & Plan Note (Signed)
Controlled.  Continue allopurinol 100 mg daily for maintenance.

## 2022-11-07 NOTE — Assessment & Plan Note (Signed)
Stable.  Continue Trelegy inhaler as maintenance with albuterol inhaler for rescue.

## 2022-11-07 NOTE — Assessment & Plan Note (Addendum)
Stable.  Continue metoprolol 150 mg daily.  Collecting CMP for medication monitoring and will continue to monitor.

## 2022-11-07 NOTE — Progress Notes (Signed)
Established Patient Office Visit  Subjective   Patient ID: Andrew Townsend, male    DOB: [birth year]  Age: 72 y.o. MRN: [id number]  Chief Complaint  Patient presents with   Medication Refill    HPI Andrew Townsend is a 72 y.o. male presenting today for follow up of chronic conditions including hypertension, hyperlipidemia, COPD, gout. Requesting labwork ordered by nephrologist be drawn here, also would like all refills.  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  He must force his finger to bend back to normal position with his other hand for it to be alleviated.  It will happen on either his right or his left side.  He also notes that there is a nodule on the palm of his left hand between the tendons for his middle and ring fingers. Hypertension: Patient here for follow-up of elevated blood pressure.  Pt denies chest pain, SOB, dizziness, edema, syncope, fatigue or heart palpitations. Taking metoprolol, reports excellent compliance with treatment. Denies side effects. Hyperlipidemia: tolerating ezetimibe and rosuvastatin well with no myalgias or significant side effects.  The ASCVD Risk score (Arnett DK, et al., 2019) failed to calculate for the following reasons:   The patient has a prior MI or stroke diagnosis COPD: He does believe that his lung function might be getting a little bit worse.  Trelegy is still working well overall, but he has noticed that at times he may struggle to catch his breath more than in the past.  He admits that while he has quit smoking cigarettes, he is still vaping. Gout: Currently well-controlled on allopurinol 100 mg daily.  He was started on this more than 20 years ago and since that time has not had any flares.  ROS Negative unless otherwise noted in HPI   Objective:     BP 132/64   Pulse (!) 56   Temp (!) 97.4 F (36.3 C)  (Oral)   Ht 5\' 6"  (1.676 m)   Wt 116 lb 4 oz (52.7 kg)   SpO2 100%   BMI 18.76 kg/m   Physical Exam Constitutional:      General: He is not in acute distress.    Appearance: Normal appearance.  HENT:     Head: Normocephalic and atraumatic.  Cardiovascular:     Rate and Rhythm: Normal rate and regular rhythm.     Heart sounds: Normal heart sounds. No murmur heard.    No friction rub. No gallop.  Pulmonary:     Effort: Pulmonary effort is normal.     Breath sounds: Wheezing (RLL) present. No rhonchi or rales.  Musculoskeletal:     Comments: Normal ROM on exam today.  Palpable nodule on flexor tendon of ring finger on left hand.  No erythema or swelling of the hands.  Nontender to palpation.  Skin:    General: Skin is warm and dry.  Neurological:     Mental Status: He is alert and oriented to person, place, and time.  Psychiatric:        Mood and Affect: Mood normal.     Assessment & Plan:  Stage 3b chronic kidney disease [facility]) Assessment & Plan: Followed by nephrology, last appointment 07/23/2022.  Ordered and drew requested blood work including PTH, vitamin D, urinalysis, microalbumin/creatinine urine ratio, CBC with differential, CMP to include renal function.  Will forward lab  results to nephrologist.  Orders: -     Parathyroid hormone, intact (no Ca) -     VITAMIN D 25 Hydroxy (Vit-D Deficiency, Fractures); Future -     Metoprolol Succinate ER; Take 1.5 tablets (150 mg total) by mouth daily.  Dispense: 135 tablet; Refill: 1 -     Urinalysis -     Microalbumin / creatinine urine ratio; Future  Primary hypertension Assessment & Plan: Stable.  Continue metoprolol 150 mg daily.  Collecting CMP for medication monitoring and will continue to monitor.  Orders: -     CBC with Differential/Platelet; Future -     Comprehensive metabolic panel; Future  Hyperlipidemia, unspecified hyperlipidemia type Assessment & Plan: Last lipid panel: LDL 55, HDL 44, triglycerides 63.   Repeating lipid panel today.  Continue Zetia 10 mg daily and rosuvastatin 40 mg daily.  Will continue to monitor.  Orders: -     Lipid panel; Future -     Ezetimibe; Take 1 tablet (10 mg total) by mouth daily.  Dispense: 90 tablet; Refill: 1 -     Rosuvastatin Calcium; Take 1 tablet (40 mg total) by mouth at bedtime.  Dispense: 90 tablet; Refill: 1  Chronic obstructive pulmonary disease, unspecified COPD type (HCC) Assessment & Plan: Stable.  Continue Trelegy inhaler as maintenance with albuterol inhaler for rescue.  Orders: -     Trelegy Ellipta; Inhale 1 puff into the lungs daily.  Dispense: 3 each; Refill: 1  Chronic gout due to renal impairment of multiple sites without tophus Assessment & Plan: Controlled.  Continue allopurinol 100 mg daily for maintenance.  Orders: -     Allopurinol; Take 1 tablet (100 mg total) by mouth daily.  Dispense: 90 tablet; Refill: 1  Insomnia, unspecified type Assessment & Plan: Refill of Ambien 5 mg #90 tablets refilled on 07/09/2022 to use as needed for sleep.   Pain in finger of both hands  Advised watchful waiting and to continue with exercises squeezing towels when his finger does get locked up.  If it does worsen, then I advise seeing orthopedics to discuss whether some sort of release procedure or injections might be appropriate.  Provided refills of all primary care medicines.  We also discussed the possibility of changing medications to [facility] so that they are mailed directly to his house to save him trips to the pharmacy.  He does prefer to go in person to the pharmacy to have a relationship with the pharmacist in case he does need to ask questions.  Return in about 6 months (around 05/09/2023) for follow-up for HTN, HLD, COPD, gout, GERD, CKD, fasting blood work 1 week before.    Melida Quitter, PA

## 2022-11-07 NOTE — Assessment & Plan Note (Signed)
Last lipid panel: LDL 55, HDL 44, triglycerides 63.  Repeating lipid panel today.  Continue Zetia 10 mg daily and rosuvastatin 40 mg daily.  Will continue to monitor.

## 2022-11-07 NOTE — Assessment & Plan Note (Signed)
Refill of Ambien 5 mg #90 tablets refilled on 07/09/2022 to use as needed for sleep.

## 2022-11-07 NOTE — Patient Instructions (Signed)
Continue with exercises like squeezing a towel periodically to prevent your hand from locking up.

## 2022-11-07 NOTE — Assessment & Plan Note (Signed)
Followed by nephrology, last appointment 07/23/2022.  Ordered and drew requested blood work including PTH, vitamin D, urinalysis, microalbumin/creatinine urine ratio, CBC with differential, CMP to include renal function.  Will forward lab results to nephrologist.

## 2022-11-08 LAB — COMPREHENSIVE METABOLIC PANEL
ALT: 33 IU/L (ref 0–44)
AST: 31 IU/L (ref 0–40)
Albumin: 4.7 g/dL (ref 3.8–4.8)
Alkaline Phosphatase: 111 IU/L (ref 44–121)
BUN/Creatinine Ratio: 15 (ref 10–24)
BUN: 22 mg/dL (ref 8–27)
Bilirubin Total: 0.6 mg/dL (ref 0.0–1.2)
CO2: 22 mmol/L (ref 20–29)
Calcium: 10 mg/dL (ref 8.6–10.2)
Chloride: 100 mmol/L (ref 96–106)
Creatinine, Ser: 1.42 mg/dL — ABNORMAL HIGH (ref 0.76–1.27)
Globulin, Total: 2.7 g/dL (ref 1.5–4.5)
Glucose: 73 mg/dL (ref 70–99)
Potassium: 4.4 mmol/L (ref 3.5–5.2)
Sodium: 140 mmol/L (ref 134–144)
Total Protein: 7.4 g/dL (ref 6.0–8.5)
eGFR: 53 mL/min/{1.73_m2} — ABNORMAL LOW (ref >59)

## 2022-11-08 LAB — CBC WITH DIFFERENTIAL/PLATELET
Basophils Absolute: 0.1 10*3/uL (ref 0.0–0.2)
Basos: 1 %
EOS (ABSOLUTE): 0.1 10*3/uL (ref 0.0–0.4)
Eos: 1 %
Hematocrit: 44.4 % (ref 37.5–51.0)
Hemoglobin: 14.8 g/dL (ref 13.0–17.7)
Immature Grans (Abs): 0 10*3/uL (ref 0.0–0.1)
Immature Granulocytes: 0 %
Lymphocytes Absolute: 1.9 10*3/uL (ref 0.7–3.1)
Lymphs: 23 %
MCH: 29.3 pg (ref 26.6–33.0)
MCHC: 33.3 g/dL (ref 31.5–35.7)
MCV: 88 fL (ref 79–97)
Monocytes Absolute: 0.8 10*3/uL (ref 0.1–0.9)
Monocytes: 10 %
Neutrophils Absolute: 5.3 10*3/uL (ref 1.4–7.0)
Neutrophils: 65 %
Platelets: 195 10*3/uL (ref 150–450)
RBC: 5.05 x10E6/uL (ref 4.14–5.80)
RDW: 14.9 % (ref 11.6–15.4)
WBC: 8.2 10*3/uL (ref 3.4–10.8)

## 2022-11-08 LAB — URINALYSIS
Bilirubin, UA: NEGATIVE
Ketones, UA: NEGATIVE
Leukocytes,UA: NEGATIVE
Nitrite, UA: NEGATIVE
Protein,UA: NEGATIVE
RBC, UA: NEGATIVE
Specific Gravity, UA: 1.01 (ref 1.005–1.030)
Urobilinogen, Ur: 0.2 mg/dL (ref 0.2–1.0)
pH, UA: 6 (ref 5.0–7.5)

## 2022-11-08 LAB — LIPID PANEL
Chol/HDL Ratio: 2.9 ratio (ref 0.0–5.0)
Cholesterol, Total: 136 mg/dL (ref 100–199)
HDL: 47 mg/dL (ref >39)
LDL Chol Calc (NIH): 75 mg/dL (ref 0–99)
Triglycerides: 70 mg/dL (ref 0–149)
VLDL Cholesterol Cal: 14 mg/dL (ref 5–40)

## 2022-11-08 LAB — MICROALBUMIN / CREATININE URINE RATIO
Creatinine, Urine: 8.2 mg/dL
Microalbumin, Urine: <3 ug/mL

## 2022-11-08 LAB — VITAMIN D 25 HYDROXY (VIT D DEFICIENCY, FRACTURES): Vit D, 25-Hydroxy: 64.6 ng/mL (ref 30.0–100.0)

## 2022-11-08 LAB — PARATHYROID HORMONE, INTACT (NO CA): PTH: 25 pg/mL (ref 15–65)

## 2022-12-11 ENCOUNTER — Encounter: Payer: Self-pay | Admitting: Internal Medicine

## 2022-12-11 ENCOUNTER — Ambulatory Visit: Payer: Medicare PPO | Admitting: Internal Medicine

## 2022-12-11 ENCOUNTER — Ambulatory Visit (INDEPENDENT_AMBULATORY_CARE_PROVIDER_SITE_OTHER): Payer: Medicare PPO

## 2022-12-11 VITALS — BP 140/62 | HR 47 | Ht 66.0 in | Wt 116.0 lb

## 2022-12-11 DIAGNOSIS — I255 Ischemic cardiomyopathy: Secondary | ICD-10-CM

## 2022-12-11 DIAGNOSIS — I480 Paroxysmal atrial fibrillation: Secondary | ICD-10-CM

## 2022-12-11 DIAGNOSIS — I493 Ventricular premature depolarization: Secondary | ICD-10-CM | POA: Diagnosis not present

## 2022-12-11 DIAGNOSIS — Z95810 Presence of automatic (implantable) cardiac defibrillator: Secondary | ICD-10-CM | POA: Diagnosis not present

## 2022-12-11 NOTE — Progress Notes (Signed)
HPI [name] returns today for ongoing evaluation of ICM, s/p ICD insertion. He has 3 vessel CAD, s/p CABG with mild/mod LV dysfunction s/p ICD insertion. He has class 2 CHF. He has chronic renal insuff, stage 3. He has not had palpitations, chest pain or sob. He has been found to have about 25% PVC's. He denies ICD therapy. No Known Allergies   Current Outpatient Medications  Medication Sig Dispense Refill   albuterol (PROVENTIL) (2.5 MG/3ML) 0.083% nebulizer solution INHALE THE CONTENTS OF 1 VIAL VIA NEBULIZER EVERY 6 HOURS AS NEEDED FOR WHEEZING OR SHORTNESS OF BREATH. 150 mL 1   albuterol (VENTOLIN HFA) 108 (90 Base) MCG/ACT inhaler Inhale 2 puffs into the lungs every 6 (six) hours as needed for wheezing or shortness of breath. 24 g 1   allopurinol (ZYLOPRIM) 100 MG tablet Take 1 tablet (100 mg total) by mouth daily. 90 tablet 1   apixaban (ELIQUIS) 2.5 MG TABS tablet TAKE 1 TABLET BY MOUTH 2 TIMES DAILY. 60 tablet 6   buprenorphine (BUTRANS) 20 MCG/HR PTWK Place 1 patch onto the skin once a week. 4 patch 2   empagliflozin (JARDIANCE) 10 MG TABS tablet Take 10 mg by mouth daily.     ezetimibe (ZETIA) 10 MG tablet Take 1 tablet (10 mg total) by mouth daily. 90 tablet 1   Fluticasone-Umeclidin-Vilant (TRELEGY ELLIPTA) 100-62.5-25 MCG/ACT AEPB Inhale 1 puff into the lungs daily. 3 each 1   furosemide (LASIX) 40 MG tablet Take 1 tablet (40 mg total) by mouth daily as needed for fluid. 90 tablet 0   gabapentin (NEURONTIN) 300 MG capsule Take 2 capsules (600 mg total) by mouth at bedtime. 60 capsule 5   Magnesium Oxide (DIASENSE MAGNESIUM PO) Take 800 mg by mouth daily at 6 (six) AM.     metoprolol succinate (TOPROL-XL) 100 MG 24 hr tablet Take 1.5 tablets (150 mg total) by mouth daily. 135 tablet 1   Multiple Vitamin (MULTIVITAMIN) tablet Take 1 tablet by mouth daily.     nitroGLYCERIN (NITROSTAT) 0.4 MG SL tablet Place 1 tablet (0.4 mg total) under the tongue every 5 (five) minutes as  needed for chest pain. 30 tablet 1   omeprazole (PRILOSEC) 40 MG capsule TAKE 1 CAPSULE (40 MG TOTAL) BY MOUTH DAILY. NEED OFFICE VISIT FOR REFILLS 90 capsule 1   oxybutynin (DITROPAN-XL) 10 MG 24 hr tablet TAKE 1 TABLET (10 MG TOTAL) BY MOUTH DAILY. 90 tablet 0   oxyCODONE-acetaminophen (PERCOCET) 10-325 MG tablet Take 1 tablet by mouth every 8 (eight) hours as needed for pain. 90 tablet 0   potassium chloride (KLOR-CON M) 10 MEQ tablet Take 10 mEq by mouth daily.     rosuvastatin (CRESTOR) 40 MG tablet Take 1 tablet (40 mg total) by mouth at bedtime. 90 tablet 1   tiZANidine (ZANAFLEX) 4 MG tablet Take 1 tablet (4 mg total) by mouth every 8 (eight) hours as needed. 90 tablet 5   zolpidem (AMBIEN) 5 MG tablet Take 1 tablet (5 mg total) by mouth at bedtime as needed for sleep. 90 tablet 1   No current facility-administered medications for this visit.     Past Medical History:  Diagnosis Date   Arthritis    Blood transfusion without reported diagnosis    CAD (coronary artery disease)    Cataract    Chronic kidney disease    COPD (chronic obstructive pulmonary disease) (HCC)    GERD (gastroesophageal reflux disease)    Hyperlipidemia  Hypertension    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)    Myocardial infarction (HCC)    Stroke (HCC)     ROS:   All systems reviewed and negative except as noted in the HPI.   Past Surgical History:  Procedure Laterality Date   CORONARY ARTERY BYPASS GRAFT     HERNIA REPAIR       History reviewed. No pertinent family history.   Social History   Socioeconomic History   Marital status: Widowed    Spouse name: Not on file   Number of children: Not on file   Years of education: Not on file   Highest education level: Not on file  Occupational History   Not on file  Tobacco Use   Smoking status: Every Day    Types: E-cigarettes   Smokeless tobacco: Never  Vaping Use   Vaping status: Some Days   Substance and Sexual Activity   Alcohol use: Not on file   Drug use: Never   Sexual activity: Not Currently  Other Topics Concern   Not on file  Social History Narrative   Not on file   Social Determinants of Health   Financial Resource Strain: Medium Risk (07/09/2022)   Overall Financial Resource Strain (CARDIA)    Difficulty of Paying Living Expenses: Somewhat hard  Food Insecurity: No Food Insecurity (07/09/2022)   Hunger Vital Sign    Worried About Running Out of Food in the Last Year: Never true    Ran Out of Food in the Last Year: Never true  Transportation Needs: No Transportation Needs (07/09/2022)   PRAPARE - Administrator, Civil Service (Medical): No    Lack of Transportation (Non-Medical): No  Physical Activity: Sufficiently Active (07/09/2022)   Exercise Vital Sign    Days of Exercise per Week: 7 days    Minutes of Exercise per Session: 40 min  Stress: Stress Concern Present (07/09/2022)   [employer] of Occupational Health - Occupational Stress Questionnaire    Feeling of Stress : To some extent  Social Connections: Moderately Isolated (07/09/2022)   Social Connection and Isolation Panel [NHANES]    Frequency of Communication with Friends and Family: More than three times a week    Frequency of Social Gatherings with Friends and Family: More than three times a week    Attends Religious Services: 1 to 4 times per year    Active Member of [employer] or Organizations: No    Attends Banker Meetings: Never    Marital Status: Widowed  Intimate Partner Violence: Not At Risk (07/09/2022)   Humiliation, Afraid, Rape, and Kick questionnaire    Fear of Current or Ex-Partner: No    Emotionally Abused: No    Physically Abused: No    Sexually Abused: No     BP (!) 140/62   Pulse (!) 47   Ht 5\' 6"  (1.676 m)   Wt 116 lb (52.6 kg)   SpO2 97%   BMI 18.72 kg/m   Physical Exam:  Well appearing 72 yo man, NAD HEENT: Unremarkable Neck:  No JVD,  no thyromegally Lymphatics:  No adenopathy Back:  No CVA tenderness Lungs:  Clear with no wheezes HEART:  Regular rate rhythm, no murmurs, no rubs, no clicks Abd:  soft, positive bowel sounds, no organomegally, no rebound, no guarding Ext:  2 plus pulses, no edema, no cyanosis, no clubbing Skin:  No rashes no nodules Neuro:  CN II through XII intact, motor grossly intact  DEVICE  Normal device function.  See PaceArt for details.   Assess/Plan:  ICM - He is s/p CABG and denies anginal symptoms. No change in his meds. ICD - he denies any ICD shocks. His medtronic single chamber ICD is working normally.  Chronic systolic heart failure - He has class 2 symptoms. Continue medical therapy.  PAF - interrogation of his ICD demonstrates PAF. I have recommended Eliquis and we will start 2.5 mg twice daily. He will stop the ASA. PVC's - he is mostly asymptomatic. He has multiple PVC morphologies on his 12 lead ECG. I have recommended watchful waiting at this point as the risk benefit ratio goes against amiodarone.    Sharlot Gowda Clementina Mareno,MD

## 2022-12-11 NOTE — Patient Instructions (Addendum)
Medication Instructions:  Your physician recommends that you continue on your current medications as directed. Please refer to the Current Medication list given to you today.  *If you need a refill on your cardiac medications before your next appointment, please call your pharmacy*  Lab Work: None ordered.  If you have labs (blood work) drawn today and your tests are completely normal, you will receive your results only by: MyChart Message (if you have MyChart) OR A paper copy in the mail If you have any lab test that is abnormal or we need to change your treatment, we will call you to review the results.  Testing/Procedures: None ordered.  Follow-Up: At [facility], you and your health needs are our priority.  As part of our continuing mission to provide you with exceptional heart care, we have created designated Provider Care Teams.  These Care Teams include your primary Cardiologist (physician) and Advanced Practice Providers (APPs -  Physician Assistants and Nurse Practitioners) who all work together to provide you with the care you need, when you need it.  We recommend signing up for the patient portal called "MyChart".  Sign up information is provided on this After Visit Summary.  MyChart is used to connect with patients for Virtual Visits (Telemedicine).  Patients are able to view lab/test results, encounter notes, upcoming appointments, etc.  Non-urgent messages can be sent to your provider as well.   To learn more about what you can do with MyChart, go to [email or web address].    Your next appointment:   1 year(s)  The format for your next appointment:   In Person  Provider:   Lewayne Bunting, MD{or one of the following Advanced Practice Providers on your designated Care Team:   Francis Dowse, [place] Casimiro Needle "Mardelle Matte" [place], [place] Earnest Rosier, NP  Remote monitoring is used to monitor your Pacemaker/ ICD from home. This monitoring reduces the number of office visits required  to check your device to one time per year. It allows [place] to keep an eye on the functioning of your device to ensure it is working properly. You are scheduled for a device check from home on 03/12/2023. You may send your transmission at any time that day. If you have a wireless device, the transmission will be sent automatically. After your physician reviews your transmission, you will receive a postcard with your next transmission date.  Important Information About Sugar

## 2022-12-13 LAB — CUP PACEART REMOTE DEVICE CHECK
Battery Remaining Longevity: 39 mo
Brady Statistic RV Percent Paced: 12.08 %
Date Time Interrogation Session: 20240724150605
HighPow Impedance: 71 Ohm
Implantable Lead Connection Status: 753985
Implantable Lead Implant Date: 20180209
Implantable Lead Location: 753860
Implantable Pulse Generator Implant Date: 20180209
Lead Channel Impedance Value: 342 Ohm
Lead Channel Sensing Intrinsic Amplitude: 2 mV
Lead Channel Sensing Intrinsic Amplitude: 2 mV
Lead Channel Setting Pacing Amplitude: 2 V
Lead Channel Setting Pacing Pulse Width: 0.5 ms
Lead Channel Setting Sensing Sensitivity: 0.3 mV
Zone Setting Status: 755011

## 2022-12-14 LAB — CUP PACEART INCLINIC DEVICE CHECK

## 2022-12-18 ENCOUNTER — Encounter: Payer: Self-pay | Admitting: Student in an Organized Health Care Education/Training Program

## 2022-12-18 ENCOUNTER — Ambulatory Visit
Payer: Medicare PPO | Attending: Student in an Organized Health Care Education/Training Program | Admitting: Student in an Organized Health Care Education/Training Program

## 2022-12-18 VITALS — BP 140/58 | HR 49 | Temp 98.2°F | Resp 17 | Ht 66.0 in | Wt 120.0 lb

## 2022-12-18 DIAGNOSIS — G5703 Lesion of sciatic nerve, bilateral lower limbs: Secondary | ICD-10-CM | POA: Diagnosis present

## 2022-12-18 DIAGNOSIS — Z9889 Other specified postprocedural states: Secondary | ICD-10-CM | POA: Insufficient documentation

## 2022-12-18 DIAGNOSIS — M4125 Other idiopathic scoliosis, thoracolumbar region: Secondary | ICD-10-CM | POA: Insufficient documentation

## 2022-12-18 DIAGNOSIS — G629 Polyneuropathy, unspecified: Secondary | ICD-10-CM | POA: Insufficient documentation

## 2022-12-18 DIAGNOSIS — Z79891 Long term (current) use of opiate analgesic: Secondary | ICD-10-CM | POA: Insufficient documentation

## 2022-12-18 DIAGNOSIS — M47816 Spondylosis without myelopathy or radiculopathy, lumbar region: Secondary | ICD-10-CM | POA: Insufficient documentation

## 2022-12-18 DIAGNOSIS — G894 Chronic pain syndrome: Secondary | ICD-10-CM | POA: Insufficient documentation

## 2022-12-18 DIAGNOSIS — M47818 Spondylosis without myelopathy or radiculopathy, sacral and sacrococcygeal region: Secondary | ICD-10-CM | POA: Diagnosis present

## 2022-12-18 MED ORDER — OXYCODONE-ACETAMINOPHEN 10-325 MG PO TABS
1.0000 | ORAL_TABLET | Freq: Three times a day (TID) | ORAL | 0 refills | Status: DC | PRN
Start: 2023-02-25 — End: 2023-03-26

## 2022-12-18 MED ORDER — GABAPENTIN 300 MG PO CAPS
600.0000 mg | ORAL_CAPSULE | Freq: Every day | ORAL | 5 refills | Status: DC
Start: 2022-12-18 — End: 2023-03-26

## 2022-12-18 MED ORDER — OXYCODONE-ACETAMINOPHEN 10-325 MG PO TABS
1.0000 | ORAL_TABLET | Freq: Three times a day (TID) | ORAL | 0 refills | Status: AC | PRN
Start: 2023-01-26 — End: 2023-02-25

## 2022-12-18 MED ORDER — OXYCODONE-ACETAMINOPHEN 10-325 MG PO TABS
1.0000 | ORAL_TABLET | Freq: Three times a day (TID) | ORAL | 0 refills | Status: AC | PRN
Start: 2022-12-27 — End: 2023-01-26

## 2022-12-18 MED ORDER — TIZANIDINE HCL 4 MG PO TABS
4.0000 mg | ORAL_TABLET | Freq: Three times a day (TID) | ORAL | 5 refills | Status: DC | PRN
Start: 2022-12-18 — End: 2023-06-20

## 2022-12-18 MED ORDER — BUPRENORPHINE 20 MCG/HR TD PTWK
1.0000 | MEDICATED_PATCH | TRANSDERMAL | 2 refills | Status: AC
Start: 2022-12-27 — End: 2023-03-21

## 2022-12-18 NOTE — Progress Notes (Signed)
Nursing Pain Medication Assessment:  Safety precautions to be maintained throughout the outpatient stay will include: orient to surroundings, keep bed in low position, maintain call bell within reach at all times, provide assistance with transfer out of bed and ambulation.  Medication Inspection Compliance: Pill count conducted under aseptic conditions, in front of the patient. Neither the pills nor the bottle was removed from the patient's sight at any time. Once count was completed pills were immediately returned to the patient in their original bottle.  Medication #1: Buprenorphine (Suboxone) Pill/Patch Count:  1 of 4 pills remain Pill/Patch Appearance: Markings consistent with prescribed medication Bottle Appearance: Standard pharmacy container. Clearly labeled. Filled Date: 7 / 20 / 2024 Last Medication intake:   "last week"  Medication #2: Oxycodone/APAP Pill/Patch Count:  28 of 90 pills remain Pill/Patch Appearance: Markings consistent with prescribed medication Bottle Appearance: Standard pharmacy container. Clearly labeled. Filled Date: 7 / 9 / 2024 Last Medication intake:  Today

## 2022-12-18 NOTE — Progress Notes (Signed)
PROVIDER NOTE: Information contained herein reflects review and annotations entered in association with encounter. Interpretation of such information and data should be left to medically-trained personnel. Information provided to patient can be located elsewhere in the medical record under "Patient Instructions". Document created using STT-dictation technology, any transcriptional errors that may result from process are unintentional.    Patient: Andrew Townsend  Service Category: E/M  Provider: Edward Jolly, MD  DOB: [birth year]  DOS: 12/18/2022  Referring Provider: Melida Quitter, PA  MRN: [id number]  Specialty: Interventional Pain Management  PCP: Melida Quitter, PA  Type: Established Patient  Setting: Ambulatory outpatient    Location: Office  Delivery: Face-to-face     HPI  Andrew Townsend, a 72 y.o. 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 Mr. Wesson primary complain today is Back Pain (lower) Last encounter: My last encounter with him was on 09/18/22 Pertinent problems: Mr. 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 Pain Assessment: Severity of Chronic pain is reported as a 7 /10. Location: Back Lower/into buttocks, numbness on sides of thighs bilat and tingling down front of shins bilat. Onset: More than a month ago. Quality: Constant, Tingling, Numbness. Timing: Constant. Modifying factor(s): meds, injections. Vitals:  height is 5\' 6"  (1.676 m) and weight is 120 lb (54.4 kg). His temporal temperature is 98.2  F (36.8 C). His blood pressure is 140/58 (abnormal) and his pulse is 49 (abnormal). His respiration is 17 and oxygen saturation is 95%.   Reason for encounter: medication management.  No significant change in Mildred's medical history since his last visit with me.  He is endorsing increased pain overlying his SI joint and piriformis.  He is status post bilateral SI joint and piriformis injection in September 2023 that provided him with 65 to 70% pain relief for about 7 months.  Given return of pain, he states he would like to repeat.  Patient continues multimodal pain regimen as prescribed.  States that it provides pain relief and improvement in functional status.  HPI from initial clinic note: Andrew Townsend is a pleasant 72 year old male who has moved from [facility] hoping to establish with pain management.  Patient has a history of bilateral shoulder pain status post left rotator cuff surgery x1, right rotator cuff surgery x2, low back pain related to lumbar spinal stenosis and lumbar degenerative disc disease and lumbar facet arthropathy.  He has been on chronic opioid therapy which includes Butrans patch at 15 mcg an hour along with Percocet 10 mg 3 times daily as needed.  He also takes gabapentin 300 mg nightly which she is hoping to increase the dose of.  He has difficulty ambulating.  He states that he has to sit down and rest given weakness and pain in bilateral legs.  He also has a history of coronary artery disease status post CABG x4.  He has a ICD in place along with COPD, stage IV chronic kidney disease for which she needs to establish with nephrologist.  He has a right renal artery stent in place.  He is complaining of bilateral hip pain, SI joint pain as  well as bilateral knee pain.  He is on Eliquis for his cardiac disease.  Of note, he has had many injections done in the past at his previous pain clinic including diagnostic lumbar facet medial branch nerve blocks, subsequent lumbar  radiofrequency ablations with limited response.  He is also had multiple epidurals with limited response as well.  He is open to interventional options for pain management.    Pharmacotherapy Assessment  Analgesic: Butrans patch, 20 mcg an hour, Percocet 10 mg 3 times daily as needed  Monitoring: [place] PMP: PDMP reviewed during this encounter.       Pharmacotherapy: No side-effects or adverse reactions reported. Compliance: No problems identified. Effectiveness: Clinically acceptable.  Nonah Mattes, RN  12/18/2022 10:45 AM  Sign when Signing Visit Nursing Pain Medication Assessment:  Safety precautions to be maintained throughout the outpatient stay will include: orient to surroundings, keep bed in low position, maintain call bell within reach at all times, provide assistance with transfer out of bed and ambulation.  Medication Inspection Compliance: Pill count conducted under aseptic conditions, in front of the patient. Neither the pills nor the bottle was removed from the patient's sight at any time. Once count was completed pills were immediately returned to the patient in their original bottle.  Medication #1: Buprenorphine (Suboxone) Pill/Patch Count:  1 of 4 pills remain Pill/Patch Appearance: Markings consistent with prescribed medication Bottle Appearance: Standard pharmacy container. Clearly labeled. Filled Date: 7 / 39 / 2024 Last Medication intake:   "last week"  Medication #2: Oxycodone/APAP Pill/Patch Count:  28 of 90 pills remain Pill/Patch Appearance: Markings consistent with prescribed medication Bottle Appearance: Standard pharmacy container. Clearly labeled. Filled Date: 7 / 9 / 2024 Last Medication intake:  Today  No results found for: "CBDTHCR" No results found for: "D8THCCBX" No results found for: "D9THCCBX"  UDS:  Summary  Date Value Ref Range Status  12/07/2021 Note  Final    Comment:     ==================================================================== Compliance Drug Analysis, Ur ==================================================================== Test                             Result       Flag       Units  Drug Present and Declared for Prescription Verification   Oxycodone                      6752         EXPECTED   ng/mg creat   Oxymorphone                    1668         EXPECTED   ng/mg creat   Noroxycodone                   6775         EXPECTED   ng/mg creat   Noroxymorphone                 719          EXPECTED   ng/mg creat    Sources of oxycodone are scheduled prescription medications.    Oxymorphone, noroxycodone, and noroxymorphone are expected    metabolites of oxycodone. Oxymorphone is also available as a    scheduled prescription medication.    Buprenorphine                  7  EXPECTED   ng/mg creat   Norbuprenorphine               9            EXPECTED   ng/mg creat    Source of buprenorphine is a scheduled prescription medication.    Norbuprenorphine is an expected metabolite of buprenorphine.    Gabapentin                     PRESENT      EXPECTED   Tizanidine                     PRESENT      EXPECTED   Zopiclone/Eszopiclone          PRESENT      EXPECTED    Eszopiclone is detected as zopiclone (racemic).    Acetaminophen                  PRESENT      EXPECTED   Metoprolol                     PRESENT      EXPECTED  Drug Present not Declared for Prescription Verification   Ibuprofen                      PRESENT      UNEXPECTED ==================================================================== Test                      Result    Flag   Units      Ref Range   Creatinine              75               mg/dL      >=16 ==================================================================== Declared Medications:  The flagging and interpretation on this report are based on the  following declared medications.  Unexpected results may arise  from  inaccuracies in the declared medications.   **Note: The testing scope of this panel includes these medications:   Gabapentin (Neurontin)  Metoprolol (Toprol)  Oxycodone (Percocet)   **Note: The testing scope of this panel does not include small to  moderate amounts of these reported medications:   Acetaminophen (Percocet)  Buprenorphine Patch (BuTrans)  Eszopiclone (Lunesta)  Tizanidine (Zanaflex)   **Note: The testing scope of this panel does not include the  following reported medications:   Albuterol (Ventolin HFA)  Allopurinol (Zyloprim)  Apixaban (Eliquis)  Ezetimibe (Zetia)  Fluticasone (Trelegy)  Furosemide (Lasix)  Nitroglycerin (Nitrostat)  Omeprazole (Prilosec)  Oxybutynin (Ditropan)  Potassium Chloride  Rosuvastatin (Crestor)  Umeclidinium (Trelegy)  Vilanterol (Trelegy) ==================================================================== For clinical consultation, please call [phone]. ====================================================================       ROS  Constitutional: Denies any fever or chills Gastrointestinal: No reported hemesis, hematochezia, vomiting, or acute GI distress Musculoskeletal:  Low back, bilateral SI joint and piriformis pain Neurological: No reported episodes of acute onset apraxia, aphasia, dysarthria, agnosia, amnesia, paralysis, loss of coordination, or loss of consciousness  Medication Review  Fluticasone-Umeclidin-Vilant, Magnesium Oxide, albuterol, allopurinol, apixaban, buprenorphine, empagliflozin, ezetimibe, furosemide, gabapentin, metoprolol succinate, multivitamin, nitroGLYCERIN, omeprazole, oxyCODONE-acetaminophen, oxybutynin, potassium chloride, rosuvastatin, tiZANidine, and zolpidem  History Review  Allergy: Mr. Geckler has No Known Allergies. Drug: Mr. Douglas  reports no history of drug use. Alcohol:  has no history on file for alcohol use. Tobacco:  reports that he has been smoking e-cigarettes.  He  has never used smokeless tobacco. Social: Mr. Sunshine  reports that he has been smoking e-cigarettes. He has never used smokeless tobacco. He reports that he does not use drugs. Medical:  has a past medical history of Arthritis, Blood transfusion without reported diagnosis, CAD (coronary artery disease), Cataract, Chronic kidney disease, COPD (chronic obstructive pulmonary disease) (HCC), GERD (gastroesophageal reflux disease), Hyperlipidemia, Hypertension, 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), Myocardial infarction (HCC), and Stroke (HCC). Surgical: Mr. Cloke  has a past surgical history that includes Coronary artery bypass graft and Hernia repair. Family: family history is not on file.  Laboratory Chemistry Profile   Renal Lab Results  Component Value Date   BUN 22 11/07/2022   CREATININE 1.42 (H) 11/07/2022   BCR 15 11/07/2022    Hepatic Lab Results  Component Value Date   AST 31 11/07/2022   ALT 33 11/07/2022   ALBUMIN 4.7 11/07/2022   ALKPHOS 111 11/07/2022    Electrolytes Lab Results  Component Value Date   NA 140 11/07/2022   K 4.4 11/07/2022   CL 100 11/07/2022   CALCIUM 10.0 11/07/2022   MG 2.1 07/31/2022    Bone Lab Results  Component Value Date   VD25OH 64.6 11/07/2022    Inflammation (CRP: Acute Phase) (ESR: Chronic Phase) Lab Results  Component Value Date   CRP 18 (H) 06/26/2022   ESRSEDRATE 6 06/26/2022         Note: Above Lab results reviewed.  Sacroiliac Joint Imaging: Sacroiliac Joint DG: Results for orders placed during the hospital encounter of 12/07/21   DG Si Joints   Narrative CLINICAL DATA:  Sacroiliac joint pain. Chronic lower back pain radiating to the bilateral legs.   EXAM: BILATERAL SACROILIAC JOINTS - 3+ VIEW   COMPARISON:  None Available.   FINDINGS: There is diffuse decreased bone mineralization. Mild mid to superior bilateral sacroiliac subchondral sclerosis and joint space narrowing. No  subchondral erosions.   Mild bilateral femoroacetabular joint space narrowing. Moderate to severe left L4-5 and L5-S1 disc space narrowing. Hernia mesh coils overlie the pelvis. No acute fracture or dislocation.   IMPRESSION: Mild bilateral sacroiliac osteoarthritis.     Electronically Signed By: Neita Garnet M.D. On: 12/08/2021 11:12         Narrative CLINICAL DATA:  Chronic bilateral hip pain.   EXAM: DG HIP (WITH OR WITHOUT PELVIS) 2-3V RIGHT; DG HIP (WITH OR WITHOUT PELVIS) 2-3V LEFT   COMPARISON:  None Available.   FINDINGS: Mild bilateral sacroiliac joint space narrowing and subchondral sclerosis. Mild bilateral superolateral acetabular degenerative osteophytes. Mild bilateral femoroacetabular joint space narrowing. The pubic symphysis joint space is maintained. Diffuse decreased bone mineralization. No acute fracture is seen. No dislocation. Mild-to-moderate vascular calcifications. Hernia mesh coils overlie the pelvis.   IMPRESSION: Mild bilateral femoroacetabular osteoarthritis.     Electronically Signed By: Neita Garnet M.D. On: 12/08/2021 11:24   Narrative CLINICAL DATA:  Chronic bilateral hip pain.   EXAM: DG HIP (WITH OR WITHOUT PELVIS) 2-3V RIGHT; DG HIP (WITH OR WITHOUT PELVIS) 2-3V LEFT   COMPARISON:  None Available.   FINDINGS: Mild bilateral sacroiliac joint space narrowing and subchondral sclerosis. Mild bilateral superolateral acetabular degenerative osteophytes. Mild bilateral femoroacetabular joint space narrowing. The pubic symphysis joint space is maintained. Diffuse decreased bone mineralization. No acute fracture is seen. No dislocation. Mild-to-moderate vascular calcifications. Hernia mesh coils overlie the pelvis.   IMPRESSION: Mild bilateral femoroacetabular osteoarthritis.     Electronically Signed By: Neita Garnet M.D. On:  12/08/2021 11:24    Physical Exam  General appearance: Well nourished, well developed, and  well hydrated. In no apparent acute distress Mental status: Alert, oriented x 3 (person, place, & time)       Respiratory: No evidence of acute respiratory distress Eyes: PERLA Vitals: BP (!) 140/58   Pulse (!) 49   Temp 98.2 F (36.8 C) (Temporal)   Resp 17   Ht 5\' 6"  (1.676 m)   Wt 120 lb (54.4 kg)   SpO2 95%   BMI 19.37 kg/m  BMI: Estimated body mass index is 19.37 kg/m as calculated from the following:   Height as of this encounter: 5\' 6"  (1.676 m).   Weight as of this encounter: 120 lb (54.4 kg). Ideal: Ideal body weight: 63.8 kg (140 lb 10.5 oz)  Lumbar Spine Area Exam  Skin & Axial Inspection: No masses, redness, or swelling Alignment: Symmetrical Functional ROM: Pain restricted ROM       Stability: No instability detected Muscle Tone/Strength: Functionally intact. No obvious neuro-muscular anomalies detected. Sensory (Neurological): Dermatomal pain pattern and musculoskeletal Palpation: No palpable anomalies       Provocative Tests: Hyperextension/rotation test: deferred today       Lumbar quadrant test (Kemp's test): deferred today       Lateral bending test: deferred today       Patrick's Maneuver: (+) for bilateral S-I arthralgia             FABER* test: +) for bilateral S-I arthralgia        S-I anterior distraction/compression test: (+) for bilateral S-I arthralgia   S-I lateral compression test: (+) for bilateral S-I arthralgia   S-I Thigh-thrust test: (+) for bilateral S-I arthralgia   S-I Gaenslen's test: (+) for bilateral S-I arthralgia   *(Flexion, ABduction and External Rotation)             Gait & Posture Assessment  Ambulation: Unassisted Gait: Relatively normal for age and body habitus Posture: WNL  Lower Extremity Exam      Side: Right lower extremity   Side: Left lower extremity  Stability: No instability observed           Stability: No instability observed          Skin & Extremity Inspection: Skin color, temperature, and hair growth are WNL.  No peripheral edema or cyanosis. No masses, redness, swelling, asymmetry, or associated skin lesions. No contractures.   Skin & Extremity Inspection: Skin color, temperature, and hair growth are WNL. No peripheral edema or cyanosis. No masses, redness, swelling, asymmetry, or associated skin lesions. No contractures.  Functional ROM: Pain restricted ROM for hip and knee joints           Functional ROM: Pain restricted ROM for hip and knee joints          Muscle Tone/Strength: Functionally intact. No obvious neuro-muscular anomalies detected.   Muscle Tone/Strength: Functionally intact. No obvious neuro-muscular anomalies detected.  Sensory (Neurological): Musculoskeletal pain pattern         Sensory (Neurological): Musculoskeletal pain pattern        DTR: Patellar: deferred today Achilles: deferred today Plantar: deferred today   DTR: Patellar: deferred today Achilles: deferred today Plantar: deferred today  Palpation: No palpable anomalies   Palpation: No palpable anomalies     Assessment   Diagnosis Status  1. 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 Piriformis syndrome of both sides   3. Lumbar facet arthropathy  4. Encounter for long-term opiate analgesic use   5. Chronic pain syndrome   6. Neuropathy   7. Other idiopathic scoliosis, thoracolumbar region   8. Hx of shoulder surgery (left rotator cuff surgery, right rotator cuff then reverse total shoulder arthoplasty)     Having a Flare-up Having a Flare-up Controlled   Plan of Care   Mr. Tymire Bivins has a current medication list which includes the following long-term medication(s): albuterol, albuterol, allopurinol, eliquis, ezetimibe, furosemide, metoprolol succinate, nitroglycerin, omeprazole, potassium chloride, rosuvastatin, zolpidem, gabapentin, and tizanidine.  Pharmacotherapy (Medications Ordered): Meds ordered this encounter  Medications   oxyCODONE-acetaminophen (PERCOCET) 10-325 MG tablet    Sig: Take 1  tablet by mouth every 8 (eight) hours as needed for pain.    Dispense:  90 tablet    Refill:  0   buprenorphine (BUTRANS) 20 MCG/HR PTWK    Sig: Place 1 patch onto the skin once a week.    Dispense:  4 patch    Refill:  2   oxyCODONE-acetaminophen (PERCOCET) 10-325 MG tablet    Sig: Take 1 tablet by mouth every 8 (eight) hours as needed for pain.    Dispense:  90 tablet    Refill:  0   oxyCODONE-acetaminophen (PERCOCET) 10-325 MG tablet    Sig: Take 1 tablet by mouth every 8 (eight) hours as needed for pain.    Dispense:  90 tablet    Refill:  0   gabapentin (NEURONTIN) 300 MG capsule    Sig: Take 2 capsules (600 mg total) by mouth at bedtime.    Dispense:  60 capsule    Refill:  5   tiZANidine (ZANAFLEX) 4 MG tablet    Sig: Take 1 tablet (4 mg total) by mouth every 8 (eight) hours as needed.    Dispense:  90 tablet    Refill:  5     Orders:  Orders Placed This Encounter  Procedures   SACROILIAC JOINT INJECTION    Standing Status:   Future    Standing Expiration Date:   03/20/2023    Scheduling Instructions:     Side: Bilateral     Sedation: without     Timeframe: ASAP    Order Specific Question:   Where will this procedure be performed?    Answer:   [facility]   TRIGGER POINT INJECTION    Area: Buttocks region (gluteal area) Indications: Piriformis muscle pain;  b/l piriformis-syndrome; piriformis muscle spasms (U98.119). CPT code: 14782    Standing Status:   Future    Standing Expiration Date:   12/18/2023    Scheduling Instructions:     Type: Myoneural block (TPI) of piriformis muscle.     Side:  B/L     Sedation: Patient's choice.    Order Specific Question:   Where will this procedure be performed?    Answer:   [facility]   ToxASSURE Select 13 (MW), Urine    Volume: 30 ml(s). Minimum 3 ml of urine is needed. Document temperature of fresh sample. Indications: Long term (current) use of opiate analgesic [phone])    Order Specific Question:    Release to patient    Answer:   Immediate   Follow-up plan:   Return in about 13 days (around 12/31/2022) for B/L SI-J and Piriformis TPI, in clinic NS.     B/L SI joint and piriformis 01/03/2022, 02/07/22   Recent Visits No visits were found meeting these conditions. Showing recent visits within past 90  days and meeting all other requirements Today's Visits Date Type Provider Dept  12/18/22 Office Visit Edward Jolly, MD [facility]  Showing today's visits and meeting all other requirements Future Appointments No visits were found meeting these conditions. Showing future appointments within next 90 days and meeting all other requirements  I discussed the assessment and treatment plan with the patient. The patient was provided an opportunity to ask questions and all were answered. The patient agreed with the plan and demonstrated an understanding of the instructions.  Patient advised to call back or seek an in-person evaluation if the symptoms or condition worsens.  Duration of encounter: .  Total time on encounter, as per AMA guidelines included both the face-to-face and non-face-to-face time personally spent by the physician and/or other qualified health care professional(s) on the day of the encounter (includes time in activities that require the physician or other qualified health care professional and does not include time in activities normally performed by clinical staff). Physician's time may include the following activities when performed: preparing to see the patient (eg, review of tests, pre-charting review of records) obtaining and/or reviewing separately obtained history performing a medically appropriate examination and/or evaluation counseling and educating the patient/family/caregiver ordering medications, tests, or procedures referring and communicating with other health care professionals (when not separately reported) documenting clinical information  in the electronic or other health record independently interpreting results (not separately reported) and communicating results to the patient/ family/caregiver care coordination (not separately reported)  Note by: Edward Jolly, MD Date: 12/18/2022; Time: 11:35 AM

## 2022-12-18 NOTE — Patient Instructions (Addendum)
Patient does Not need to stop Eliquis for his injections  Sacroiliac (SI) Joint Injection Patient Information  Description: The sacroiliac joint connects the scrum (very low back and tailbone) to the ilium (a pelvic bone which also forms half of the hip joint).  Normally this joint experiences very little motion.  When this joint becomes inflamed or unstable low back and or hip and pelvis pain may result.  Injection of this joint with local anesthetics (numbing medicines) and steroids can provide diagnostic information and reduce pain.  This injection is performed with the aid of x-ray guidance into the tailbone area while you are lying on your stomach.   You may experience an electrical sensation down the leg while this is being done.  You may also experience numbness.  We also may ask if we are reproducing your normal pain during the injection.  Conditions which may be treated SI injection:  Low back, buttock, hip or leg pain  Preparation for the Injection:  Do not eat any solid food or dairy products within 8 hours of your appointment.  You may drink clear liquids up to 3 hours before appointment.  Clear liquids include water, black coffee, juice or soda.  No milk or cream please. You may take your regular medications, including pain medications with a sip of water before your appointment.  Diabetics should hold regular insulin (if take separately) and take 1/2 normal NPH dose the morning of the procedure.  Carry some sugar containing items with you to your appointment. A driver must accompany you and be prepared to drive you home after your procedure. Bring all of your current medications with you. An IV may be inserted and sedation may be given at the discretion of the physician. A blood pressure cuff, EKG and other monitors will often be applied during the procedure.  Some patients may need to have extra oxygen administered for a short period.  You will be asked to provide medical  information, including your allergies, prior to the procedure.  We must know immediately if you are taking blood thinners (like Coumadin/Warfarin) or if you are allergic to IV iodine contrast (dye).  We must know if you could possible be pregnant.  Possible side effects:  Bleeding from needle site Infection (rare, may require surgery) Nerve injury (rare) Numbness & tingling (temporary) A brief convulsion or seizure Light-headedness (temporary) Pain at injection site (several days) Decreased blood pressure (temporary) Weakness in the leg (temporary)   Call if you experience:  New onset weakness or numbness of an extremity below the injection site that last more than 8 hours. Hives or difficulty breathing ( go to the emergency room) Inflammation or drainage at the injection site Any new symptoms which are concerning to you  Please note:  Although the local anesthetic injected can often make your back/ hip/ buttock/ leg feel good for several hours after the injections, the pain will likely return.  It takes 3-7 days for steroids to work in the sacroiliac area.  You may not notice any pain relief for at least that one week.  If effective, we will often do a series of three injections spaced 3-6 weeks apart to maximally decrease your pain.  After the initial series, we generally will wait some months before a repeat injection of the same type.  If you have any questions, please call [phone] [facility]

## 2022-12-19 ENCOUNTER — Telehealth: Payer: Self-pay

## 2022-12-19 NOTE — Telephone Encounter (Signed)
His insurance company wants 2 more provacative tests. They count Luisa Hart and Cecil as the same test. We need thigh thrust, compression/distraction or Yeoman. They insist on at least 3 positive tests before authorizing.I only have 2 days to enter the new information or withdraw the request.

## 2022-12-27 NOTE — Progress Notes (Signed)
Remote ICD transmission.   

## 2023-01-02 ENCOUNTER — Ambulatory Visit
Admission: RE | Admit: 2023-01-02 | Discharge: 2023-01-02 | Disposition: A | Discharge status: Home / Self Care | Payer: Medicare PPO | Source: Ambulatory Visit | Attending: Student in an Organized Health Care Education/Training Program | Admitting: Student in an Organized Health Care Education/Training Program

## 2023-01-02 ENCOUNTER — Ambulatory Visit
Payer: Medicare PPO | Attending: Student in an Organized Health Care Education/Training Program | Admitting: Student in an Organized Health Care Education/Training Program

## 2023-01-02 ENCOUNTER — Encounter: Payer: Self-pay | Admitting: Student in an Organized Health Care Education/Training Program

## 2023-01-02 VITALS — BP 169/91 | HR 88 | Temp 97.2°F | Resp 16 | Ht 66.0 in | Wt 120.0 lb

## 2023-01-02 DIAGNOSIS — M47818 Spondylosis without myelopathy or radiculopathy, sacral and sacrococcygeal region: Secondary | ICD-10-CM | POA: Diagnosis not present

## 2023-01-02 DIAGNOSIS — G5703 Lesion of sciatic nerve, bilateral lower limbs: Secondary | ICD-10-CM | POA: Diagnosis not present

## 2023-01-02 DIAGNOSIS — G8929 Other chronic pain: Secondary | ICD-10-CM | POA: Insufficient documentation

## 2023-01-02 DIAGNOSIS — M7918 Myalgia, other site: Secondary | ICD-10-CM | POA: Diagnosis not present

## 2023-01-02 DIAGNOSIS — M533 Sacrococcygeal disorders, not elsewhere classified: Secondary | ICD-10-CM | POA: Diagnosis not present

## 2023-01-02 DIAGNOSIS — Z9889 Other specified postprocedural states: Secondary | ICD-10-CM

## 2023-01-02 DIAGNOSIS — M19012 Primary osteoarthritis, left shoulder: Secondary | ICD-10-CM | POA: Insufficient documentation

## 2023-01-02 DIAGNOSIS — M545 Low back pain, unspecified: Secondary | ICD-10-CM | POA: Insufficient documentation

## 2023-01-02 MED ORDER — METHYLPREDNISOLONE ACETATE 80 MG/ML IJ SUSP
80.0000 mg | Freq: Once | INTRAMUSCULAR | Status: AC
  Administered 2023-01-02: 80 mg via INTRA_ARTICULAR

## 2023-01-02 MED ORDER — METHYLPREDNISOLONE ACETATE 80 MG/ML IJ SUSP
INTRAMUSCULAR | Status: AC
  Filled 2023-01-02: qty 1

## 2023-01-02 MED ORDER — DEXAMETHASONE SODIUM PHOSPHATE 10 MG/ML IJ SOLN
INTRAMUSCULAR | Status: AC
  Filled 2023-01-02: qty 1

## 2023-01-02 MED ORDER — IOHEXOL 180 MG/ML  SOLN
INTRAMUSCULAR | Status: AC
  Filled 2023-01-02: qty 20

## 2023-01-02 MED ORDER — LIDOCAINE HCL 2 % IJ SOLN
INTRAMUSCULAR | Status: AC
  Filled 2023-01-02: qty 20

## 2023-01-02 MED ORDER — IOHEXOL 180 MG/ML  SOLN
10.0000 mL | Freq: Once | INTRAMUSCULAR | Status: AC
  Administered 2023-01-02: 10 mL via INTRA_ARTICULAR

## 2023-01-02 MED ORDER — ROPIVACAINE HCL 2 MG/ML IJ SOLN
INTRAMUSCULAR | Status: AC
  Filled 2023-01-02: qty 20

## 2023-01-02 MED ORDER — ROPIVACAINE HCL 2 MG/ML IJ SOLN
9.0000 mL | Freq: Once | INTRAMUSCULAR | Status: AC
  Administered 2023-01-02: 9 mL via INTRA_ARTICULAR

## 2023-01-02 MED ORDER — ROPIVACAINE HCL 2 MG/ML IJ SOLN
9.0000 mL | Freq: Once | INTRAMUSCULAR | Status: AC
  Administered 2023-01-02: 9 mL via PERINEURAL

## 2023-01-02 MED ORDER — DEXAMETHASONE SODIUM PHOSPHATE 10 MG/ML IJ SOLN
10.0000 mg | Freq: Once | INTRAMUSCULAR | Status: AC
  Administered 2023-01-02: 10 mg

## 2023-01-02 MED ORDER — LIDOCAINE HCL 2 % IJ SOLN
20.0000 mL | Freq: Once | INTRAMUSCULAR | Status: AC
  Administered 2023-01-02: 400 mg

## 2023-01-02 NOTE — Progress Notes (Signed)
Safety precautions to be maintained throughout the outpatient stay will include: orient to surroundings, keep bed in low position, maintain call bell within reach at all times, provide assistance with transfer out of bed and ambulation.  

## 2023-01-02 NOTE — Patient Instructions (Addendum)
Sacroiliac (SI) Joint Injection Patient Information  Description: The sacroiliac joint connects the scrum (very low back and tailbone) to the ilium (a pelvic bone which also forms half of the hip joint).  Normally this joint experiences very little motion.  When this joint becomes inflamed or unstable low back and or hip and pelvis pain may result.  Injection of this joint with local anesthetics (numbing medicines) and steroids can provide diagnostic information and reduce pain.  This injection is performed with the aid of x-ray guidance into the tailbone area while you are lying on your stomach.   You may experience an electrical sensation down the leg while this is being done.  You may also experience numbness.  We also may ask if we are reproducing your normal pain during the injection.  Conditions which may be treated SI injection:  Low back, buttock, hip or leg pain  Preparation for the Injection:  Do not eat any solid food or dairy products within 8 hours of your appointment.  You may drink clear liquids up to 3 hours before appointment.  Clear liquids include water, black coffee, juice or soda.  No milk or cream please. You may take your regular medications, including pain medications with a sip of water before your appointment.  Diabetics should hold regular insulin (if take separately) and take 1/2 normal NPH dose the morning of the procedure.  Carry some sugar containing items with you to your appointment. A driver must accompany you and be prepared to drive you home after your procedure. Bring all of your current medications with you. An IV may be inserted and sedation may be given at the discretion of the physician. A blood pressure cuff, EKG and other monitors will often be applied during the procedure.  Some patients may need to have extra oxygen administered for a short period.  You will be asked to provide medical information, including your allergies, prior to the procedure.  We  must know immediately if you are taking blood thinners (like Coumadin/Warfarin) or if you are allergic to IV iodine contrast (dye).  We must know if you could possible be pregnant.  Possible side effects:  Bleeding from needle site Infection (rare, may require surgery) Nerve injury (rare) Numbness & tingling (temporary) A brief convulsion or seizure Light-headedness (temporary) Pain at injection site (several days) Decreased blood pressure (temporary) Weakness in the leg (temporary)   Call if you experience:  New onset weakness or numbness of an extremity below the injection site that last more than 8 hours. Hives or difficulty breathing ( go to the emergency room) Inflammation or drainage at the injection site Any new symptoms which are concerning to you  Please note:  Although the local anesthetic injected can often make your back/ hip/ buttock/ leg feel good for several hours after the injections, the pain will likely return.  It takes 3-7 days for steroids to work in the sacroiliac area.  You may not notice any pain relief for at least that one week.  If effective, we will often do a series of three injections spaced 3-6 weeks apart to maximally decrease your pain.  After the initial series, we generally will wait some months before a repeat injection of the same type.  If you have any questions, please call [phone] [facility]  Pain Management Discharge Instructions  General Discharge Instructions :  If you need to reach your doctor call: Monday-Friday 8:00 am - 4:00 pm at  [phone] or toll free [phone].  After clinic hours [phone] to have operator reach doctor.  Bring all of your medication bottles to all your appointments in the pain clinic.  To cancel or reschedule your appointment with Pain Management please remember to call 24 hours in advance to avoid a fee.  Refer to the educational materials which you have  been given on: General Risks, I had my Procedure. Discharge Instructions, Post Sedation.  Post Procedure Instructions:  The drugs you were given will stay in your system until tomorrow, so for the next 24 hours you should not drive, make any legal decisions or drink any alcoholic beverages.  You may eat anything you prefer, but it is better to start with liquids then soups and crackers, and gradually work up to solid foods.  Please notify your doctor immediately if you have any unusual bleeding, trouble breathing or pain that is not related to your normal pain.  Depending on the type of procedure that was done, some parts of your body may feel week and/or numb.  This usually clears up by tonight or the next day.  Walk with the use of an assistive device or accompanied by an adult for the 24 hours.  You may use ice on the affected area for the first 24 hours.  Put ice in a Ziploc bag and cover with a towel and place against area 15 minutes on 15 minutes off.  You may switch to heat after 24 hours.Sacroiliac (SI) Joint Injection Patient Information  Description: The sacroiliac joint connects the scrum (very low back and tailbone) to the ilium (a pelvic bone which also forms half of the hip joint).  Normally this joint experiences very little motion.  When this joint becomes inflamed or unstable low back and or hip and pelvis pain may result.  Injection of this joint with local anesthetics (numbing medicines) and steroids can provide diagnostic information and reduce pain.  This injection is performed with the aid of x-ray guidance into the tailbone area while you are lying on your stomach.   You may experience an electrical sensation down the leg while this is being done.  You may also experience numbness.  We also may ask if we are reproducing your normal pain during the injection.  Conditions which may be treated SI injection:  Low back, buttock, hip or leg pain  Preparation for the  Injection:  Do not eat any solid food or dairy products within 8 hours of your appointment.  You may drink clear liquids up to 3 hours before appointment.  Clear liquids include water, black coffee, juice or soda.  No milk or cream please. You may take your regular medications, including pain medications with a sip of water before your appointment.  Diabetics should hold regular insulin (if take separately) and take 1/2 normal NPH dose the morning of the procedure.  Carry some sugar containing items with you to your appointment. A driver must accompany you and be prepared to drive you home after your procedure. Bring all of your current medications with you. An IV may be inserted and sedation may be given at the discretion of the physician. A blood pressure cuff, EKG and other monitors will often be applied during the procedure.  Some patients may need to have extra oxygen administered for a short period.  You will be asked to provide medical information, including your allergies, prior to the procedure.  We must know immediately if you are taking blood thinners (  like Coumadin/Warfarin) or if you are allergic to IV iodine contrast (dye).  We must know if you could possible be pregnant.  Possible side effects:  Bleeding from needle site Infection (rare, may require surgery) Nerve injury (rare) Numbness & tingling (temporary) A brief convulsion or seizure Light-headedness (temporary) Pain at injection site (several days) Decreased blood pressure (temporary) Weakness in the leg (temporary)   Call if you experience:  New onset weakness or numbness of an extremity below the injection site that last more than 8 hours. Hives or difficulty breathing ( go to the emergency room) Inflammation or drainage at the injection site Any new symptoms which are concerning to you  Please note:  Although the local anesthetic injected can often make your back/ hip/ buttock/ leg feel good for several hours  after the injections, the pain will likely return.  It takes 3-7 days for steroids to work in the sacroiliac area.  You may not notice any pain relief for at least that one week.  If effective, we will often do a series of three injections spaced 3-6 weeks apart to maximally decrease your pain.  After the initial series, we generally will wait some months before a repeat injection of the same type.  If you have any questions, please call [phone] [facility]

## 2023-01-02 NOTE — Progress Notes (Deleted)
Safety precautions to be maintained throughout the outpatient stay will include: orient to surroundings, keep bed in low position, maintain call bell within reach at all times, provide assistance with transfer out of bed and ambulation. Safety precautions to be maintained throughout the outpatient stay will include: orient to surroundings, keep bed in low position, maintain call bell within reach at all times, provide assistance with transfer out of bed and ambulation.  

## 2023-01-02 NOTE — Progress Notes (Signed)
PROVIDER NOTE: Interpretation of information contained herein should be left to medically-trained personnel. Specific patient instructions are provided elsewhere under "Patient Instructions" section of medical record. This document was created in part using STT-dictation technology, any transcriptional errors that may result from this process are unintentional.  Patient: Andrew Townsend Type: Established DOB: 04-07-1951 MRN: [id number] PCP: Melida Quitter, PA  Service: Procedure DOS: 01/02/2023 Setting: Ambulatory Location: Ambulatory outpatient facility Delivery: Face-to-face Provider: Edward Jolly, MD Specialty: Interventional Pain Management Specialty designation: 09 Location: Outpatient facility Ref. Prov.: Edward Jolly, MD    Primary Reason for Visit: Interventional Pain Management Treatment. CC: Back Pain (lower)   Procedure:               Type: Sacroiliac Joint Steroid Injection #3   and bilateral piriformis injection #3 Laterality: Bilateral     Level: PIIS (Posterior Inferior Iliac Spine)  Imaging: Fluoroscopic guidance Anesthesia: Local anesthesia (1-2% Lidocaine) Anxiolysis: Oral Valium 5 mg DOS: 01/02/2023  Performed by: Edward Jolly, MD  Purpose: Diagnostic/Therapeutic Indications: Sacroiliac joint pain in the lower back and hip area severe enough to impact quality of life or function. Rationale (medical necessity): procedure needed and proper for the diagnosis and/or treatment of Mr. Aragona medical symptoms and needs. 1. Piriformis syndrome of both sides   2. 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 Hx of shoulder surgery (left rotator cuff surgery, right rotator cuff then reverse total shoulder arthoplasty)   4. Chronic left shoulder pain   5. Primary osteoarthritis of left shoulder    NAS-11 Pain score:   Pre-procedure: 7 /10   Post-procedure: 7 /10     Target: Interarticular sacroiliac joint. Location: Medial to the postero-medial edge of iliac  spine. Region: Lumbosacral-sacrococcygeal. Approach: Inferior postero-medial percutaneous approach. Type of procedure: Percutaneous joint injection.  Position / Prep / Materials:  Position: Prone  Prep solution: DuraPrep (Iodine Povacrylex [0.7% available iodine] and Isopropyl Alcohol, 74% w/w) Prep Area: Entire posterior lumbosacral area  Materials:  Tray: Block Needle(s):  Type: Spinal  Gauge (G): 25  Length: 3.5-in Qty: 2  Pre-op H&P Assessment:  Mr. Bro is a 72 y.o. (year old), male patient, seen today for interventional treatment. He  has a past surgical history that includes Coronary artery bypass graft and Hernia repair. Mr. Nohr has a current medication list which includes the following prescription(s): albuterol, albuterol, allopurinol, eliquis, buprenorphine, empagliflozin, ezetimibe, trelegy ellipta, furosemide, gabapentin, magnesium oxide, metoprolol succinate, multivitamin, nitroglycerin, omeprazole, oxybutynin, oxycodone-acetaminophen, [START ON 01/26/2023] oxycodone-acetaminophen, [START ON 02/25/2023] oxycodone-acetaminophen, potassium chloride, rosuvastatin, tizanidine, and zolpidem. His primarily concern today is the Back Pain (lower)  Initial Vital Signs:  Pulse/HCG Rate:  88 ECG Heart Rate: 69 Temp: (!) 97.2 F (36.2 C) Resp: 18 BP: (!) 147/67 SpO2: 100 %  BMI: Estimated body mass index is 19.37 kg/m as calculated from the following:   Height as of this encounter: 5\' 6"  (1.676 m).   Weight as of this encounter: 120 lb (54.4 kg).  Risk Assessment: Allergies: Reviewed. He has No Known Allergies.  Allergy Precautions: None required Coagulopathies: Reviewed. None identified.  Blood-thinner therapy: None at this time Active Infection(s): Reviewed. None identified. Mr. Cima is afebrile  Site Confirmation: Mr. Plough was asked to confirm the procedure and laterality before marking the site Procedure checklist: Completed Consent: Before the procedure and under the  influence of no sedative(s), amnesic(s), or anxiolytics, the patient was informed of the treatment options, risks and possible complications. To fulfill our ethical and legal  obligations, as recommended by the American Medical Association's Code of Ethics, I have informed the patient of my clinical impression; the nature and purpose of the treatment or procedure; the risks, benefits, and possible complications of the intervention; the alternatives, including doing nothing; the risk(s) and benefit(s) of the alternative treatment(s) or procedure(s); and the risk(s) and benefit(s) of doing nothing. The patient was provided information about the general risks and possible complications associated with the procedure. These may include, but are not limited to: failure to achieve desired goals, infection, bleeding, organ or nerve damage, allergic reactions, paralysis, and death. In addition, the patient was informed of those risks and complications associated to the procedure, such as failure to decrease pain; infection; bleeding; organ or nerve damage with subsequent damage to sensory, motor, and/or autonomic systems, resulting in permanent pain, numbness, and/or weakness of one or several areas of the body; allergic reactions; (i.e.: anaphylactic reaction); and/or death. Furthermore, the patient was informed of those risks and complications associated with the medications. These include, but are not limited to: allergic reactions (i.e.: anaphylactic or anaphylactoid reaction(s)); adrenal axis suppression; blood sugar elevation that in diabetics may result in ketoacidosis or comma; water retention that in patients with history of congestive heart failure may result in shortness of breath, pulmonary edema, and decompensation with resultant heart failure; weight gain; swelling or edema; medication-induced neural toxicity; particulate matter embolism and blood vessel occlusion with resultant organ, and/or nervous system  infarction; and/or aseptic necrosis of one or more joints. Finally, the patient was informed that Medicine is not an exact science; therefore, there is also the possibility of unforeseen or unpredictable risks and/or possible complications that may result in a catastrophic outcome. The patient indicated having understood very clearly. We have given the patient no guarantees and we have made no promises. Enough time was given to the patient to ask questions, all of which were answered to the patient's satisfaction. Mr. Trilling has indicated that he wanted to continue with the procedure. Attestation: I, the ordering provider, attest that I have discussed with the patient the benefits, risks, side-effects, alternatives, likelihood of achieving goals, and potential problems during recovery for the procedure that I have provided informed consent. Date  Time: 01/02/2023 10:31 AM  Pre-Procedure Preparation:  Monitoring: As per clinic protocol. Respiration, ETCO2, SpO2, BP, heart rate and rhythm monitor placed and checked for adequate function Safety Precautions: Patient was assessed for positional comfort and pressure points before starting the procedure. Time-out: I initiated and conducted the "Time-out" before starting the procedure, as per protocol. The patient was asked to participate by confirming the accuracy of the "Time Out" information. Verification of the correct person, site, and procedure were performed and confirmed by me, the nursing staff, and the patient. "Time-out" conducted as per Joint Commission's Universal Protocol (UP.01.01.01). Time: 1111  Description/Narrative of Procedure:          Rationale (medical necessity): procedure needed and proper for the diagnosis and/or treatment of the patient's medical symptoms and needs. Procedural Technique Safety Precautions: Aspiration looking for blood return was conducted prior to all injections. At no point did we inject any substances, as a needle was  being advanced. No attempts were made at seeking any paresthesias. Safe injection practices and needle disposal techniques used. Medications properly checked for expiration dates. SDV (single dose vial) medications used. Description of the Procedure: Protocol guidelines were followed. The patient was assisted into a comfortable position. The target area was identified and the area prepped in the  usual manner. Skin & deeper tissues infiltrated with local anesthetic. Appropriate amount of time allowed to pass for local anesthetics to take effect. The procedure needles were then advanced to the target area. Proper needle placement secured. Negative aspiration confirmed. Solution injected in intermittent fashion, asking for systemic symptoms every 0.5cc of injectate. The needles were then removed and the area cleansed, making sure to leave some of the prepping solution back to take advantage of its long term bactericidal properties.  Technical description of procedure:  Fluoroscopy using a posterior anterior 45 degree angle from the midline aiming at the anterolateral aspect of the patient was used to find a direct path into the sacroiliac joint, the superior medial to posterior superior iliac spine.  The skin was marked where the desired target and the skin infiltrated with local anesthetics.  The procedure needle was then advanced until the joint was entered.  Once inside of the joint, we then proceeded to inject the desired solution.  10 cc solution made of 9 cc of 0.2% ropivacaine, 1 cc of methylprednisolone, 80 mg/cc.  5 cc injected into each SI joint after contrast confirmation under fluoroscopy.  Afterwards a right piriformis trigger point injection was done 1 cm inferior, 1 cm deep, 1 cm lateral to the inferior fissure of the SI joint.  Contrast was injected to confirm piriformis muscle striation.  10 cc solution made of 9 cc of 0.2% ropivacaine, 1 cc of Decadron 10 mg/cc.  5 cc injected into the right  piriformis.While injecting, patient did not complain of any pain radiating down his right leg.  Afterwards a left piriformis trigger point injection was done 1 cm inferior, 1 cm deep, 1 cm lateral to the inferior fissure of the SI joint.  Contrast was injected to confirm piriformis muscle striation.  10 cc solution made of 9 cc of 0.2% ropivacaine, 1 cc of Decadron 10 mg/cc.  5 cc injected into the left  piriformis.While injecting, patient did not complain of any pain radiating down his left leg.   Vitals:   01/02/23 1105 01/02/23 1111 01/02/23 1116 01/02/23 1122  BP: (!) 151/83 (!) 148/89 (!) 143/97 (!) 169/91  Pulse:      Resp: 16 15 17 16   Temp:      SpO2: 96% 100% 94% 95%  Weight:      Height:        Start Time: 1111 hrs. End Time: 1119 hrs.  Post-operative Assessment:  Post-procedure Vital Signs:  Pulse/HCG Rate: 8880 Temp: (!) 97.2 F (36.2 C) Resp: 16 BP:  (!) 169/91 SpO2: 95 %  EBL: None  Complications: No immediate post-treatment complications observed by team, or reported by patient.  Note: The patient tolerated the entire procedure well. A repeat set of vitals were taken after the procedure and the patient was kept under observation following institutional policy, for this type of procedure. Post-procedural neurological assessment was performed, showing return to baseline, prior to discharge. The patient was provided with post-procedure discharge instructions, including a section on how to identify potential problems. Should any problems arise concerning this procedure, the patient was given instructions to immediately contact us, at any time, without hesitation. In any case, we plan to contact the patient by telephone for a follow-up status report regarding this interventional procedure.  Comments:  No additional relevant information.  Plan of Care  Orders:   Patient also having left shoulder pain, worse with abduction. Hx of left rotator cuff surgery, left shoulder  OA, and left rotator  cuff arthropathy  Orders Placed This Encounter  Procedures   SUPRASCAPULAR NERVE BLOCK    For shoulder pain.    Standing Status:   Future    Standing Expiration Date:   04/04/2023    Scheduling Instructions:     Purpose: Diagnostic     Laterality: LEFT     Level(s): Suprascapular notch     Sedation: Patient's choice.     Scheduling Timeframe: As permitted by the schedule    Order Specific Question:   Where will this procedure be performed?    Answer:   [facility]   DG PAIN [facility] C-ARM 1-60 MIN NO REPORT    Intraoperative interpretation by procedural physician at [facility].    Standing Status:   Standing    Number of Occurrences:   1    Order Specific Question:   Reason for exam:    Answer:   Assistance in needle guidance and placement for procedures requiring needle placement in or near specific anatomical locations not easily accessible without such assistance.     Medications ordered for procedure: Meds ordered this encounter  Medications   lidocaine (XYLOCAINE) 2 % (with pres) injection 400 mg   iohexol (OMNIPAQUE) 180 MG/ML injection 10 mL    Must be Myelogram-compatible. If not available, you may substitute with a water-soluble, non-ionic, hypoallergenic, myelogram-compatible radiological contrast medium.   dexamethasone (DECADRON) injection 10 mg   methylPREDNISolone acetate (DEPO-MEDROL) injection 80 mg   ropivacaine (PF) 2 mg/mL (0.2%) (NAROPIN) injection 9 mL   ropivacaine (PF) 2 mg/mL (0.2%) (NAROPIN) injection 9 mL   Medications administered: We administered lidocaine, iohexol, dexamethasone, methylPREDNISolone acetate, ropivacaine (PF) 2 mg/mL (0.2%), and ropivacaine (PF) 2 mg/mL (0.2%).  See the medical record for exact dosing, route, and time of administration.  Follow-up plan:   Return in about 2 weeks (around 01/16/2023) for Left SSNB, in clinic NS.      Recent Visits Date Type Provider Dept  12/18/22 Office  Visit Edward Jolly, MD [facility]  Showing recent visits within past 90 days and meeting all other requirements Today's Visits Date Type Provider Dept  01/02/23 Procedure visit Edward Jolly, MD [facility]  Showing today's visits and meeting all other requirements Future Appointments Date Type Provider Dept  03/19/23 Appointment Edward Jolly, MD [facility]  Showing future appointments within next 90 days and meeting all other requirements  Disposition: Discharge home  Discharge (Date  Time): 01/02/2023; 1130 hrs.   Primary Care Physician: Melida Quitter, PA Location: [facility] Note by: Edward Jolly, MD Date: 01/02/2023; Time: 11:58 AM  Disclaimer:  Medicine is not an exact science. The only guarantee in medicine is that nothing is guaranteed. It is important to note that the decision to proceed with this intervention was based on the information collected from the patient. The Data and conclusions were drawn from the patient's questionnaire, the interview, and the physical examination. Because the information was provided in large part by the patient, it cannot be guaranteed that it has not been purposely or unconsciously manipulated. Every effort has been made to obtain as much relevant data as possible for this evaluation. It is important to note that the conclusions that lead to this procedure are derived in large part from the available data. Always take into account that the treatment will also be dependent on availability of resources and existing treatment guidelines, considered by other Pain Management Practitioners as being common knowledge and  practice, at the time of the intervention. For Medico-Legal purposes, it is also important to point out that variation in procedural techniques and pharmacological choices are the acceptable norm. The indications, contraindications, technique, and results of the above procedure  should only be interpreted and judged by a Board-Certified Interventional Pain Specialist with extensive familiarity and expertise in the same exact procedure and technique.

## 2023-01-03 ENCOUNTER — Telehealth: Payer: Self-pay | Admitting: *Deleted

## 2023-01-03 ENCOUNTER — Other Ambulatory Visit: Payer: Self-pay | Admitting: Nurse Practitioner

## 2023-01-03 DIAGNOSIS — G4700 Insomnia, unspecified: Secondary | ICD-10-CM

## 2023-01-03 NOTE — Telephone Encounter (Signed)
Called for post procedure check. Denies any issues. 

## 2023-01-13 IMAGING — CT CT CHEST W/O CM
3 of 5 series · 17 of 36 positions shown, 19 images · non-contrast
Comparison: None.

CLINICAL DATA: Follow-up COPD and pneumonia

EXAM:
CT CHEST WITHOUT CONTRAST
TECHNIQUE: Multidetector CT imaging of the chest was performed following the
standard protocol without IV contrast.

[Series 2: routine chest without · axial · non-contrast · 0.71mm/px · z∈[-614,-358]mm · 8 of 166 slices shown, 10 images]
[im 19/166  mediastinal]
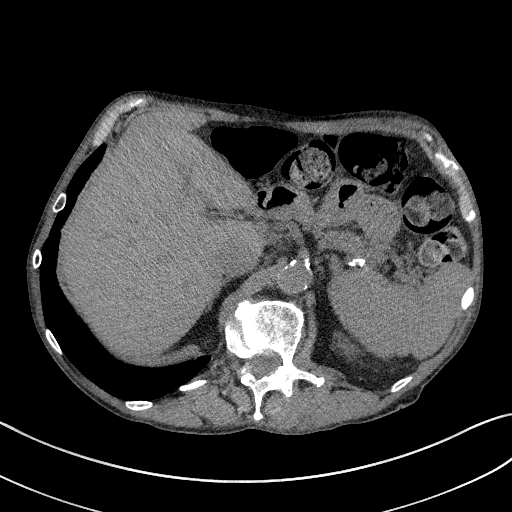
[im 19/166  lung]
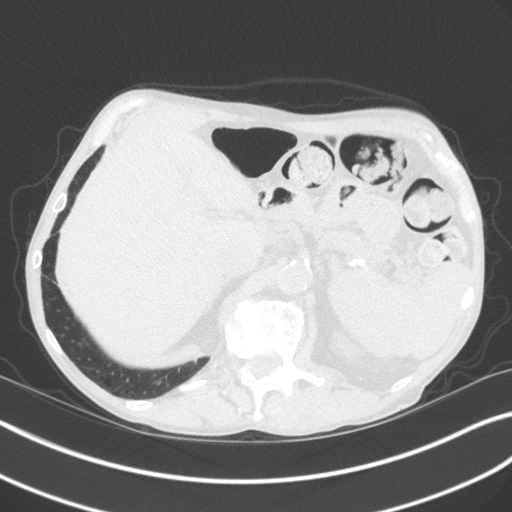
[im 37/166  lung]
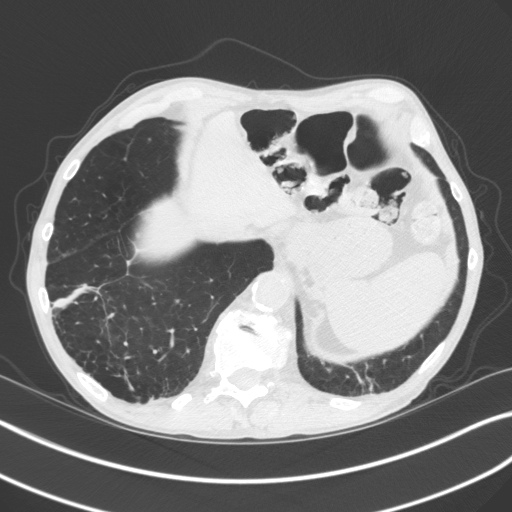
[im 56/166  lung]
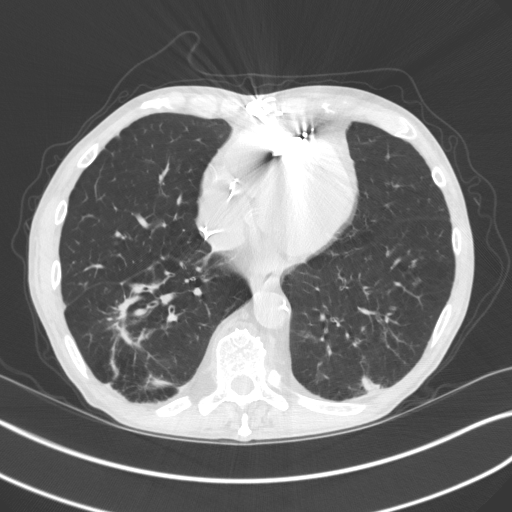
[im 74/166  lung]
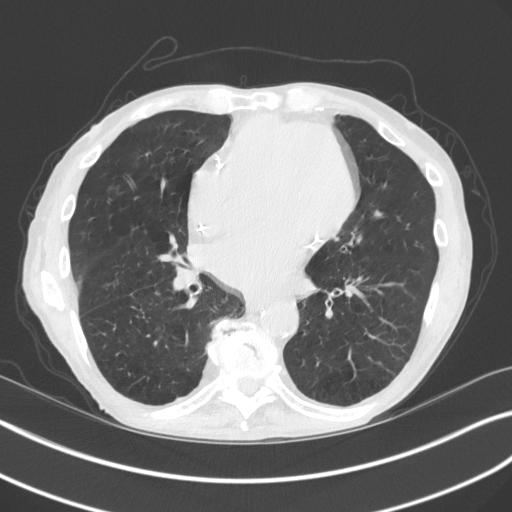
[im 92/166  mediastinal]
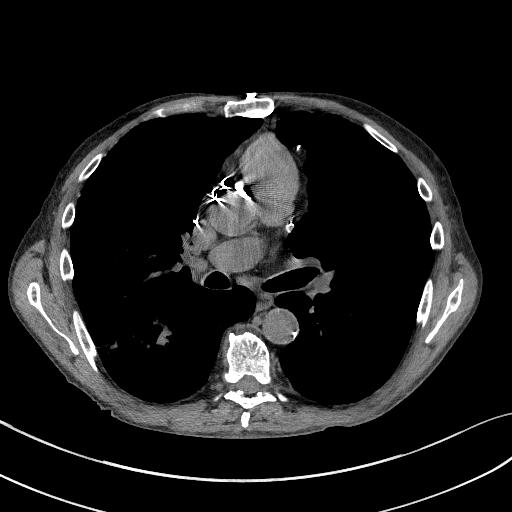
[im 92/166  lung]
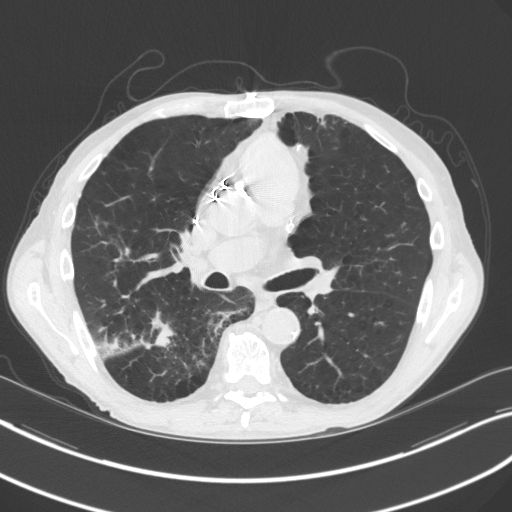
[im 111/166  lung]
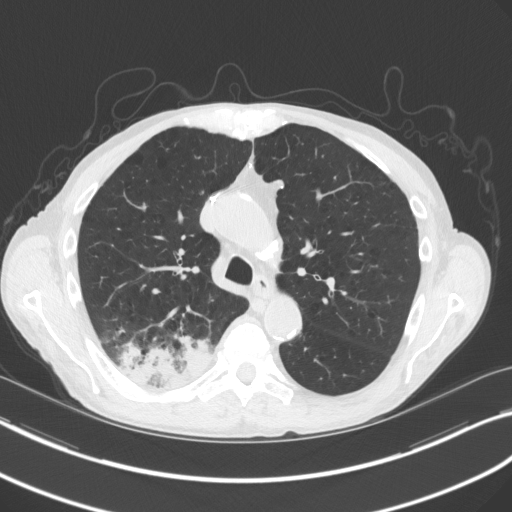
[im 129/166  lung]
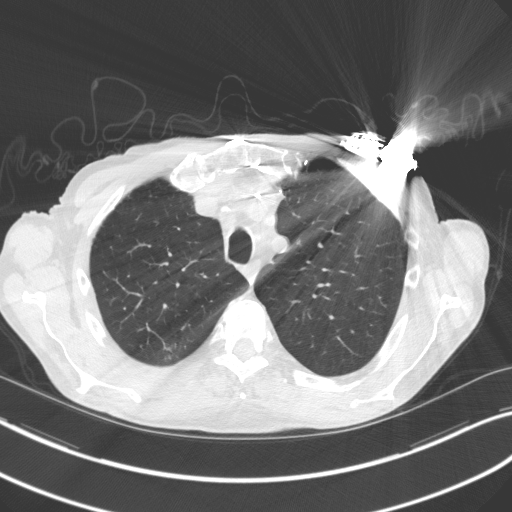
[im 147/166  lung]
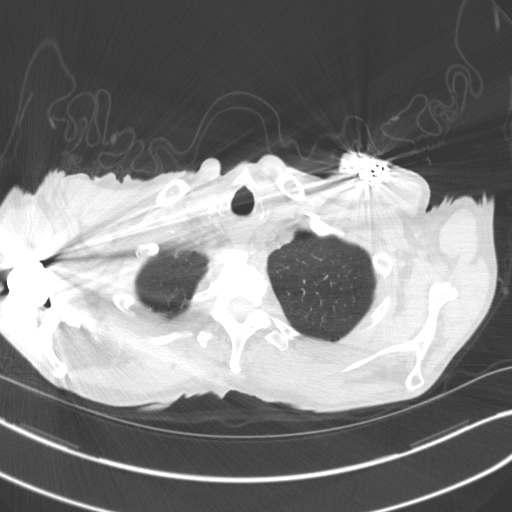

[Series 4: lungs · axial · 0.71mm/px · z∈[-610,-404]mm · 6 of 166 slices shown]
[im 21/166  lung]
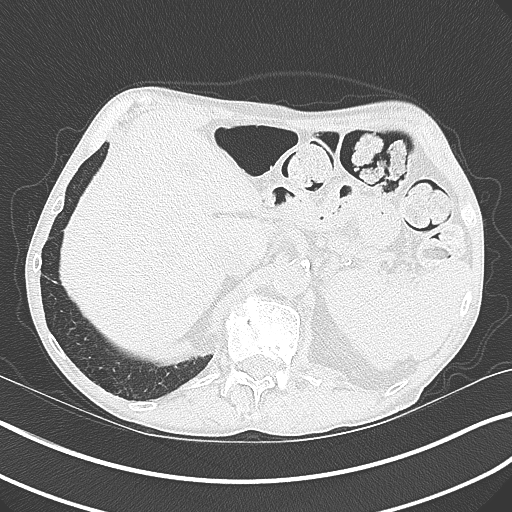
[im 42/166  lung]
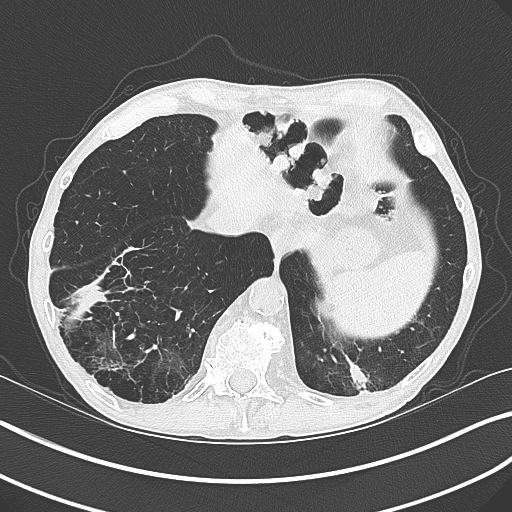
[im 62/166  lung]
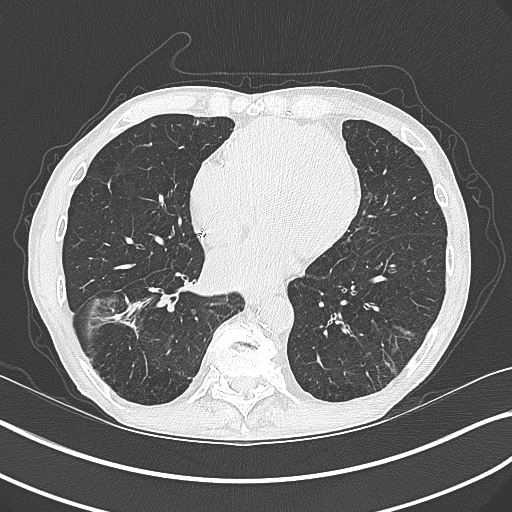
[im 83/166  lung]
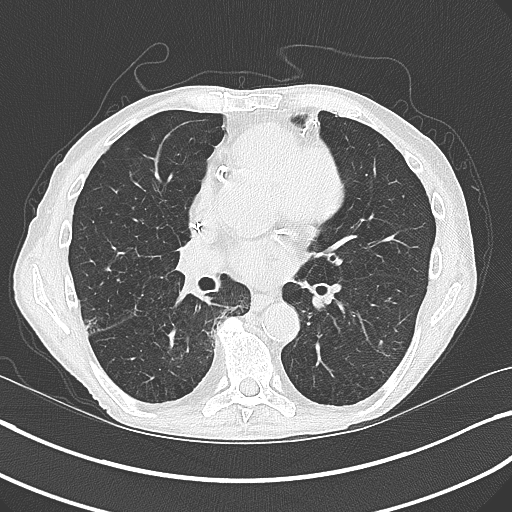
[im 104/166  lung]
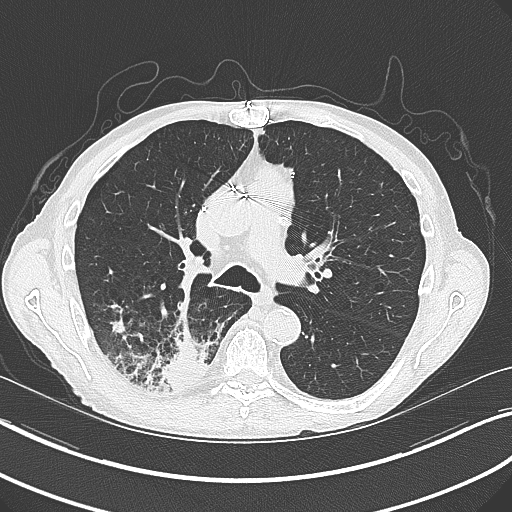
[im 124/166  lung]
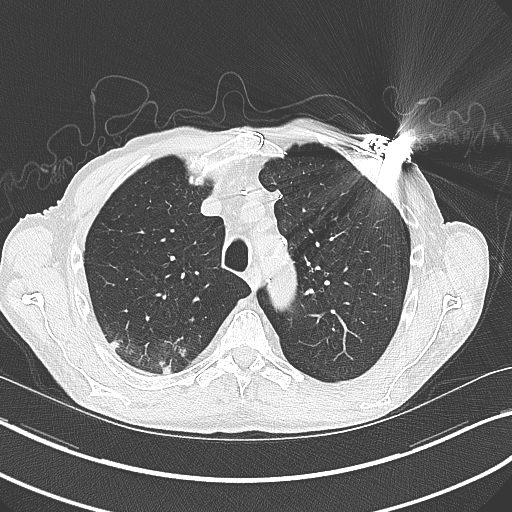

[Series 5: coronal · coronal · 0.69mm/px · 3 of 131 slices shown]
[im 27/131  lung]
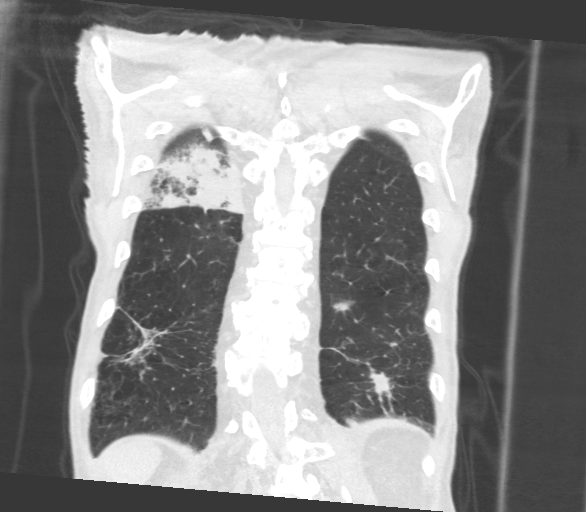
[im 53/131  lung]
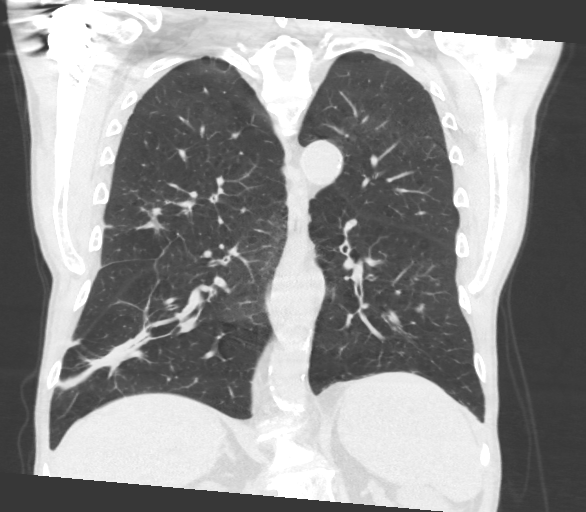
[im 79/131  lung]
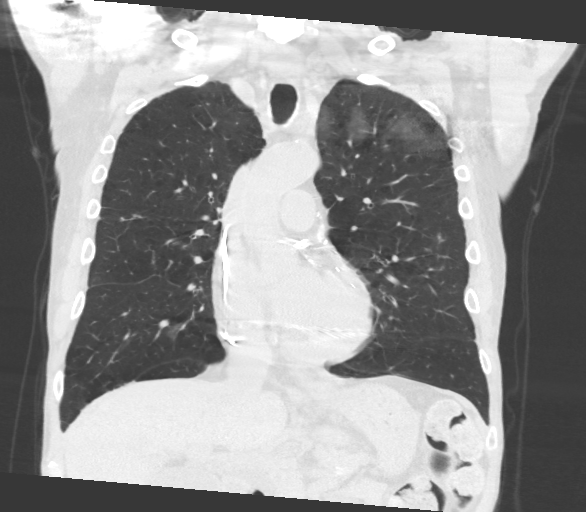

[17 of 36 positions shown; findings below may reference images not displayed]

FINDINGS: Cardiovascular: Heart is upper limits of normal in size. No
pericardial effusion. Three-vessel coronary artery calcifications
status post CABG. Atherosclerotic disease of the thoracic aorta.
Left chest wall ICD with leads positioned in the right ventricle.

Mediastinum/Nodes: No pathologically enlarged lymph nodes seen in
the chest. Small hiatal hernia.

Lungs/Pleura: Central airways are patent. Bilateral bronchial wall
thickening. Bilateral consolidations with adjacent nodularity, most
pronounced in the right upper lobe. No pleural effusion or
pneumothorax.

Upper Abdomen: No acute abnormality.

Musculoskeletal: Prior right shoulder arthroplasty.
IMPRESSION: 1. Bilateral consolidations with adjacent nodularity, most
pronounced in the posterior right upper lobe, findings are likely
sequela of infection or aspiration. Follow-up chest CT is
recommended in 2-3 months to ensure resolution.
2. Bilateral bronchial wall thickening, findings can be seen in the
setting of bronchitis.
3. Aortic Atherosclerosis (KOLUX-HND.D) and Emphysema (KOLUX-LHA.W).

## 2023-01-23 ENCOUNTER — Ambulatory Visit
Payer: Medicare PPO | Attending: Student in an Organized Health Care Education/Training Program | Admitting: Student in an Organized Health Care Education/Training Program

## 2023-01-23 ENCOUNTER — Encounter: Payer: Self-pay | Admitting: Student in an Organized Health Care Education/Training Program

## 2023-01-23 ENCOUNTER — Ambulatory Visit
Admission: RE | Admit: 2023-01-23 | Discharge: 2023-01-23 | Disposition: A | Discharge status: Home / Self Care | Payer: Medicare PPO | Source: Ambulatory Visit | Attending: Student in an Organized Health Care Education/Training Program | Admitting: Student in an Organized Health Care Education/Training Program

## 2023-01-23 DIAGNOSIS — M19012 Primary osteoarthritis, left shoulder: Secondary | ICD-10-CM | POA: Insufficient documentation

## 2023-01-23 DIAGNOSIS — M25512 Pain in left shoulder: Secondary | ICD-10-CM | POA: Insufficient documentation

## 2023-01-23 DIAGNOSIS — Z9889 Other specified postprocedural states: Secondary | ICD-10-CM | POA: Diagnosis present

## 2023-01-23 DIAGNOSIS — G8929 Other chronic pain: Secondary | ICD-10-CM | POA: Insufficient documentation

## 2023-01-23 MED ORDER — IOHEXOL 180 MG/ML  SOLN
10.0000 mL | Freq: Once | INTRAMUSCULAR | Status: AC
  Administered 2023-01-23: 10 mL via INTRA_ARTICULAR
  Filled 2023-01-23: qty 20

## 2023-01-23 MED ORDER — ROPIVACAINE HCL 2 MG/ML IJ SOLN
4.0000 mL | Freq: Once | INTRAMUSCULAR | Status: AC
  Administered 2023-01-23: 4 mL via INTRA_ARTICULAR

## 2023-01-23 MED ORDER — DEXAMETHASONE SODIUM PHOSPHATE 10 MG/ML IJ SOLN
INTRAMUSCULAR | Status: AC
  Filled 2023-01-23: qty 1

## 2023-01-23 MED ORDER — ROPIVACAINE HCL 2 MG/ML IJ SOLN
INTRAMUSCULAR | Status: AC
  Filled 2023-01-23: qty 20

## 2023-01-23 MED ORDER — LIDOCAINE HCL 2 % IJ SOLN
INTRAMUSCULAR | Status: AC
  Filled 2023-01-23: qty 20

## 2023-01-23 MED ORDER — DEXAMETHASONE SODIUM PHOSPHATE 10 MG/ML IJ SOLN
10.0000 mg | Freq: Once | INTRAMUSCULAR | Status: AC
  Administered 2023-01-23: 10 mg

## 2023-01-23 MED ORDER — LIDOCAINE HCL 2 % IJ SOLN
20.0000 mL | Freq: Once | INTRAMUSCULAR | Status: AC
  Administered 2023-01-23: 400 mg

## 2023-01-23 NOTE — Progress Notes (Signed)
Safety precautions to be maintained throughout the outpatient stay will include: orient to surroundings, keep bed in low position, maintain call bell within reach at all times, provide assistance with transfer out of bed and ambulation.  

## 2023-01-23 NOTE — Patient Instructions (Signed)

## 2023-01-23 NOTE — Progress Notes (Signed)
PROVIDER NOTE: Interpretation of information contained herein should be left to medically-trained personnel. Specific patient instructions are provided elsewhere under "Patient Instructions" section of medical record. This document was created in part using STT-dictation technology, any transcriptional errors that may result from this process are unintentional.  Patient: Andrew Townsend Type: Established DOB: 21-Jun-1950 MRN: [id number] PCP: Melida Quitter, PA  Service: Procedure DOS: 01/23/2023 Setting: Ambulatory Location: Ambulatory outpatient facility Delivery: Face-to-face Provider: Edward Jolly, MD Specialty: Interventional Pain Management Specialty designation: 09 Location: Outpatient facility Ref. Prov.: Edward Jolly, MD       Interventional Therapy   Procedure: Suprascapular nerve block (SSNB) #1  Laterality:  Left  Level: Superior to scapular spine, lateral to supraspinatus fossa (Suprascapular notch).  Imaging: Fluoroscopic guidance         Anesthesia: Local anesthesia (1-2% Lidocaine) DOS: 01/23/2023  Performed by: Edward Jolly, MD  Purpose: Diagnostic/Therapeutic Indications: Shoulder pain, severe enough to impact quality of life and/or function. 1. Hx of shoulder surgery (left rotator cuff surgery, right rotator cuff then reverse total shoulder arthoplasty)   2. Chronic left shoulder pain   3. Primary osteoarthritis of left shoulder    NAS-11 score:   Pre-procedure: 4 /10   Post-procedure: 2 /10     Target: Suprascapular nerve Location: midway between the medial border of the scapula and the acromion as it runs through the suprascapular notch. Region: Suprascapular, posterior shoulder  Approach: Percutaneous  Neuroanatomy: The suprascapular nerve is the lateral branch of the superior trunk of the brachial plexus. It receives nerve fibers that originate in the nerve roots C5 and C6 (and sometimes C4). It is a mixed nerve, meaning that it provides both sensory and motor  supply for the suprascapular region. Function: The main function of this nerve is to provide motor innervation for two muscles, the supraspinatus and infraspinatus muscles. They are part of the rotator cuff muscles. In addition, the suprascapular nerve provides a sensory supply to the joints of the scapula (glenohumeral and acromioclavicular joints). Rationale (medical necessity): procedure needed and proper for the diagnosis and/or treatment of the patient's medical symptoms and needs.  Position / Prep / Materials:  Position: Prone Materials:  Tray: Block Needle(s):  Type: Spinal  Gauge (G): 22  Length: 3.5 in.  Qty: 1 Prep solution: DuraPrep (Iodine Povacrylex [0.7% available iodine] and Isopropyl Alcohol, 74% w/w) Prep Area: Entire posterior shoulder area. From upper spine to shoulder proper (upper arm), and from lateral neck to lower tip of shoulder blade.   H&P (Pre-op Assessment):  Andrew Townsend is a 72 y.o. (year old), male patient, seen today for interventional treatment. He  has a past surgical history that includes Coronary artery bypass graft and Hernia repair. Andrew Townsend has a current medication list which includes the following prescription(s): albuterol, albuterol, allopurinol, eliquis, buprenorphine, empagliflozin, ezetimibe, trelegy ellipta, furosemide, gabapentin, magnesium oxide, metoprolol succinate, multivitamin, nitroglycerin, omeprazole, oxybutynin, oxycodone-acetaminophen, [START ON 01/26/2023] oxycodone-acetaminophen, [START ON 02/25/2023] oxycodone-acetaminophen, potassium chloride, rosuvastatin, tizanidine, and zolpidem. His primarily concern today is the Shoulder Pain (Left )  Initial Vital Signs:  Pulse/HCG Rate: (!) 40  Temp: (!) 97.1 F (36.2 C) Resp: 16 BP: (!) 124/54 SpO2: 99 %  BMI: Estimated body mass index is 18.88 kg/m as calculated from the following:   Height as of this encounter: 5\' 6"  (1.676 m).   Weight as of this encounter: 117 lb (53.1 kg).  Risk  Assessment: Allergies: Reviewed. He has No Known Allergies.  Allergy Precautions: None required Coagulopathies: Reviewed. None  identified.  Blood-thinner therapy: None at this time Active Infection(s): Reviewed. None identified. Andrew Townsend is afebrile  Site Confirmation: Andrew Townsend was asked to confirm the procedure and laterality before marking the site Procedure checklist: Completed Consent: Before the procedure and under the influence of no sedative(s), amnesic(s), or anxiolytics, the patient was informed of the treatment options, risks and possible complications. To fulfill our ethical and legal obligations, as recommended by the American Medical Association's Code of Ethics, I have informed the patient of my clinical impression; the nature and purpose of the treatment or procedure; the risks, benefits, and possible complications of the intervention; the alternatives, including doing nothing; the risk(s) and benefit(s) of the alternative treatment(s) or procedure(s); and the risk(s) and benefit(s) of doing nothing. The patient was provided information about the general risks and possible complications associated with the procedure. These may include, but are not limited to: failure to achieve desired goals, infection, bleeding, organ or nerve damage, allergic reactions, paralysis, and death. In addition, the patient was informed of those risks and complications associated to the procedure, such as failure to decrease pain; infection; bleeding; organ or nerve damage with subsequent damage to sensory, motor, and/or autonomic systems, resulting in permanent pain, numbness, and/or weakness of one or several areas of the body; allergic reactions; (i.e.: anaphylactic reaction); and/or death. Furthermore, the patient was informed of those risks and complications associated with the medications. These include, but are not limited to: allergic reactions (i.e.: anaphylactic or anaphylactoid reaction(s)); adrenal  axis suppression; blood sugar elevation that in diabetics may result in ketoacidosis or comma; water retention that in patients with history of congestive heart failure may result in shortness of breath, pulmonary edema, and decompensation with resultant heart failure; weight gain; swelling or edema; medication-induced neural toxicity; particulate matter embolism and blood vessel occlusion with resultant organ, and/or nervous system infarction; and/or aseptic necrosis of one or more joints. Finally, the patient was informed that Medicine is not an exact science; therefore, there is also the possibility of unforeseen or unpredictable risks and/or possible complications that may result in a catastrophic outcome. The patient indicated having understood very clearly. We have given the patient no guarantees and we have made no promises. Enough time was given to the patient to ask questions, all of which were answered to the patient's satisfaction. Andrew Townsend has indicated that he wanted to continue with the procedure. Attestation: I, the ordering provider, attest that I have discussed with the patient the benefits, risks, side-effects, alternatives, likelihood of achieving goals, and potential problems during recovery for the procedure that I have provided informed consent. Date  Time: 01/23/2023 10:26 AM  Pre-Procedure Preparation:  Monitoring: As per clinic protocol. Respiration, ETCO2, SpO2, BP, heart rate and rhythm monitor placed and checked for adequate function Safety Precautions: Patient was assessed for positional comfort and pressure points before starting the procedure. Time-out: I initiated and conducted the "Time-out" before starting the procedure, as per protocol. The patient was asked to participate by confirming the accuracy of the "Time Out" information. Verification of the correct person, site, and procedure were performed and confirmed by me, the nursing staff, and the patient. "Time-out"  conducted as per Joint Commission's Universal Protocol (UP.01.01.01). Time: 1105 Start Time: 1105 hrs.  Description of Procedure:          Procedural Technique Safety Precautions: Aspiration looking for blood return was conducted prior to all injections. At no point did we inject any substances, as a needle was being advanced. No  attempts were made at seeking any paresthesias. Safe injection practices and needle disposal techniques used. Medications properly checked for expiration dates. SDV (single dose vial) medications used. Description of the Procedure: Protocol guidelines were followed. The patient was placed in position over the procedure table. The target area was identified and the area prepped in the usual manner. Skin & deeper tissues infiltrated with local anesthetic. Appropriate amount of time allowed to pass for local anesthetics to take effect. The procedure needles were then advanced to the target area. Proper needle placement secured. Negative aspiration confirmed. Solution injected in intermittent fashion, asking for systemic symptoms every 0.5cc of injectate. The needles were then removed and the area cleansed, making sure to leave some of the prepping solution back to take advantage of its long term bactericidal properties.  Vitals:   01/23/23 1033 01/23/23 1102 01/23/23 1112  BP: (!) 124/54 (!) 147/82 139/73  Pulse: (!) 40 69 64  Resp: 16 16 14   Temp: (!) 97.1 F (36.2 C)    TempSrc: Temporal    SpO2: 99% 100% 100%  Weight: 117 lb (53.1 kg)    Height: 5\' 6"  (1.676 m)       Start Time: 1105 hrs. End Time: 1111 hrs.  Imaging Guidance (Spinal):          Type of Imaging Technique: Fluoroscopy Guidance (non-Spinal) Indication(s): Assistance in needle guidance and placement for procedures requiring needle placement in or near specific anatomical locations not easily accessible without such assistance. Exposure Time: Please see nurses notes. Contrast: Before injecting any  contrast, we confirmed that the patient did not have an allergy to iodine, shellfish, or radiological contrast. Once satisfactory needle placement was completed at the desired level, radiological contrast was injected. Contrast injected under live fluoroscopy. No contrast complications. See chart for type and volume of contrast used. Fluoroscopic Guidance: I was personally present during the use of fluoroscopy. "Tunnel Vision Technique" used to obtain the best possible view of the target area. Parallax error corrected before commencing the procedure. "Direction-depth-direction" technique used to introduce the needle under continuous pulsed fluoroscopy. Once target was reached, antero-posterior, oblique, and lateral fluoroscopic projection used confirm needle placement in all planes. Images permanently stored in EMR. Interpretation: I personally interpreted the imaging intraoperatively. Adequate needle placement confirmed in multiple planes. Appropriate spread of contrast into desired area was observed. No evidence of afferent or efferent intravascular uptake. No intrathecal or subarachnoid spread observed. Permanent images saved into the patient's record.  Antibiotic Prophylaxis:   Anti-infectives (From admission, onward)    None      Indication(s): None identified  Post-operative Assessment:  Post-procedure Vital Signs:  Pulse/HCG Rate: 64  Temp: (!) 97.1 F (36.2 C) Resp: 14 BP: 139/73 SpO2: 100 %  EBL: None  Complications: No immediate post-treatment complications observed by team, or reported by patient.  Note: The patient tolerated the entire procedure well. A repeat set of vitals were taken after the procedure and the patient was kept under observation following institutional policy, for this type of procedure. Post-procedural neurological assessment was performed, showing return to baseline, prior to discharge. The patient was provided with post-procedure discharge instructions,  including a section on how to identify potential problems. Should any problems arise concerning this procedure, the patient was given instructions to immediately contact us, at any time, without hesitation. In any case, we plan to contact the patient by telephone for a follow-up status report regarding this interventional procedure.  Comments:  No additional relevant information.  Plan  of Care (POC)  Orders:  Orders Placed This Encounter  Procedures   DG PAIN CLINIC C-ARM 1-60 MIN NO REPORT    Intraoperative interpretation by procedural physician at [facility].    Standing Status:   Standing    Number of Occurrences:   1    Order Specific Question:   Reason for exam:    Answer:   Assistance in needle guidance and placement for procedures requiring needle placement in or near specific anatomical locations not easily accessible without such assistance.    Medications ordered for procedure: Meds ordered this encounter  Medications   iohexol (OMNIPAQUE) 180 MG/ML injection 10 mL    Must be Myelogram-compatible. If not available, you may substitute with a water-soluble, non-ionic, hypoallergenic, myelogram-compatible radiological contrast medium.   lidocaine (XYLOCAINE) 2 % (with pres) injection 400 mg   dexamethasone (DECADRON) injection 10 mg   ropivacaine (PF) 2 mg/mL (0.2%) (NAROPIN) injection 4 mL   Medications administered: We administered iohexol, lidocaine, dexamethasone, and ropivacaine (PF) 2 mg/mL (0.2%).  See the medical record for exact dosing, route, and time of administration.  Follow-up plan:   Return for Keep sch. appt.       Recent Visits Date Type Provider Dept  01/02/23 Procedure visit Edward Jolly, MD [facility]  12/18/22 Office Visit Edward Jolly, MD [facility]  Showing recent visits within past 90 days and meeting all other requirements Today's Visits Date Type Provider Dept  01/23/23 Procedure visit Edward Jolly, MD  [facility]  Showing today's visits and meeting all other requirements Future Appointments Date Type Provider Dept  03/12/23 Appointment Edward Jolly, MD [facility]  Showing future appointments within next 90 days and meeting all other requirements  Disposition: Discharge home  Discharge (Date  Time): 01/23/2023; 1116 hrs.   Primary Care Physician: Melida Quitter, PA Location: [facility] Note by: Edward Jolly, MD (TTS technology used. I apologize for any typographical errors that were not detected and corrected.) Date: 01/23/2023; Time: 11:17 AM  Disclaimer:  Medicine is not an Visual merchandiser. The only guarantee in medicine is that nothing is guaranteed. It is important to note that the decision to proceed with this intervention was based on the information collected from the patient. The Data and conclusions were drawn from the patient's questionnaire, the interview, and the physical examination. Because the information was provided in large part by the patient, it cannot be guaranteed that it has not been purposely or unconsciously manipulated. Every effort has been made to obtain as much relevant data as possible for this evaluation. It is important to note that the conclusions that lead to this procedure are derived in large part from the available data. Always take into account that the treatment will also be dependent on availability of resources and existing treatment guidelines, considered by other Pain Management Practitioners as being common knowledge and practice, at the time of the intervention. For Medico-Legal purposes, it is also important to point out that variation in procedural techniques and pharmacological choices are the acceptable norm. The indications, contraindications, technique, and results of the above procedure should only be interpreted and judged by a Board-Certified Interventional Pain Specialist with extensive  familiarity and expertise in the same exact procedure and technique.

## 2023-01-24 ENCOUNTER — Telehealth: Payer: Self-pay

## 2023-01-24 NOTE — Telephone Encounter (Signed)
Post procedure follow up.  Patient states he is doing fine.  

## 2023-01-28 IMAGING — DX DG SHOULDER 2+V*L*
3 series · 3 of 3 positions shown · non-contrast
Comparison: None.

CLINICAL DATA: Shoulder pain

EXAM:
LEFT SHOULDER - 2+ VIEW

[shoulder grashey]
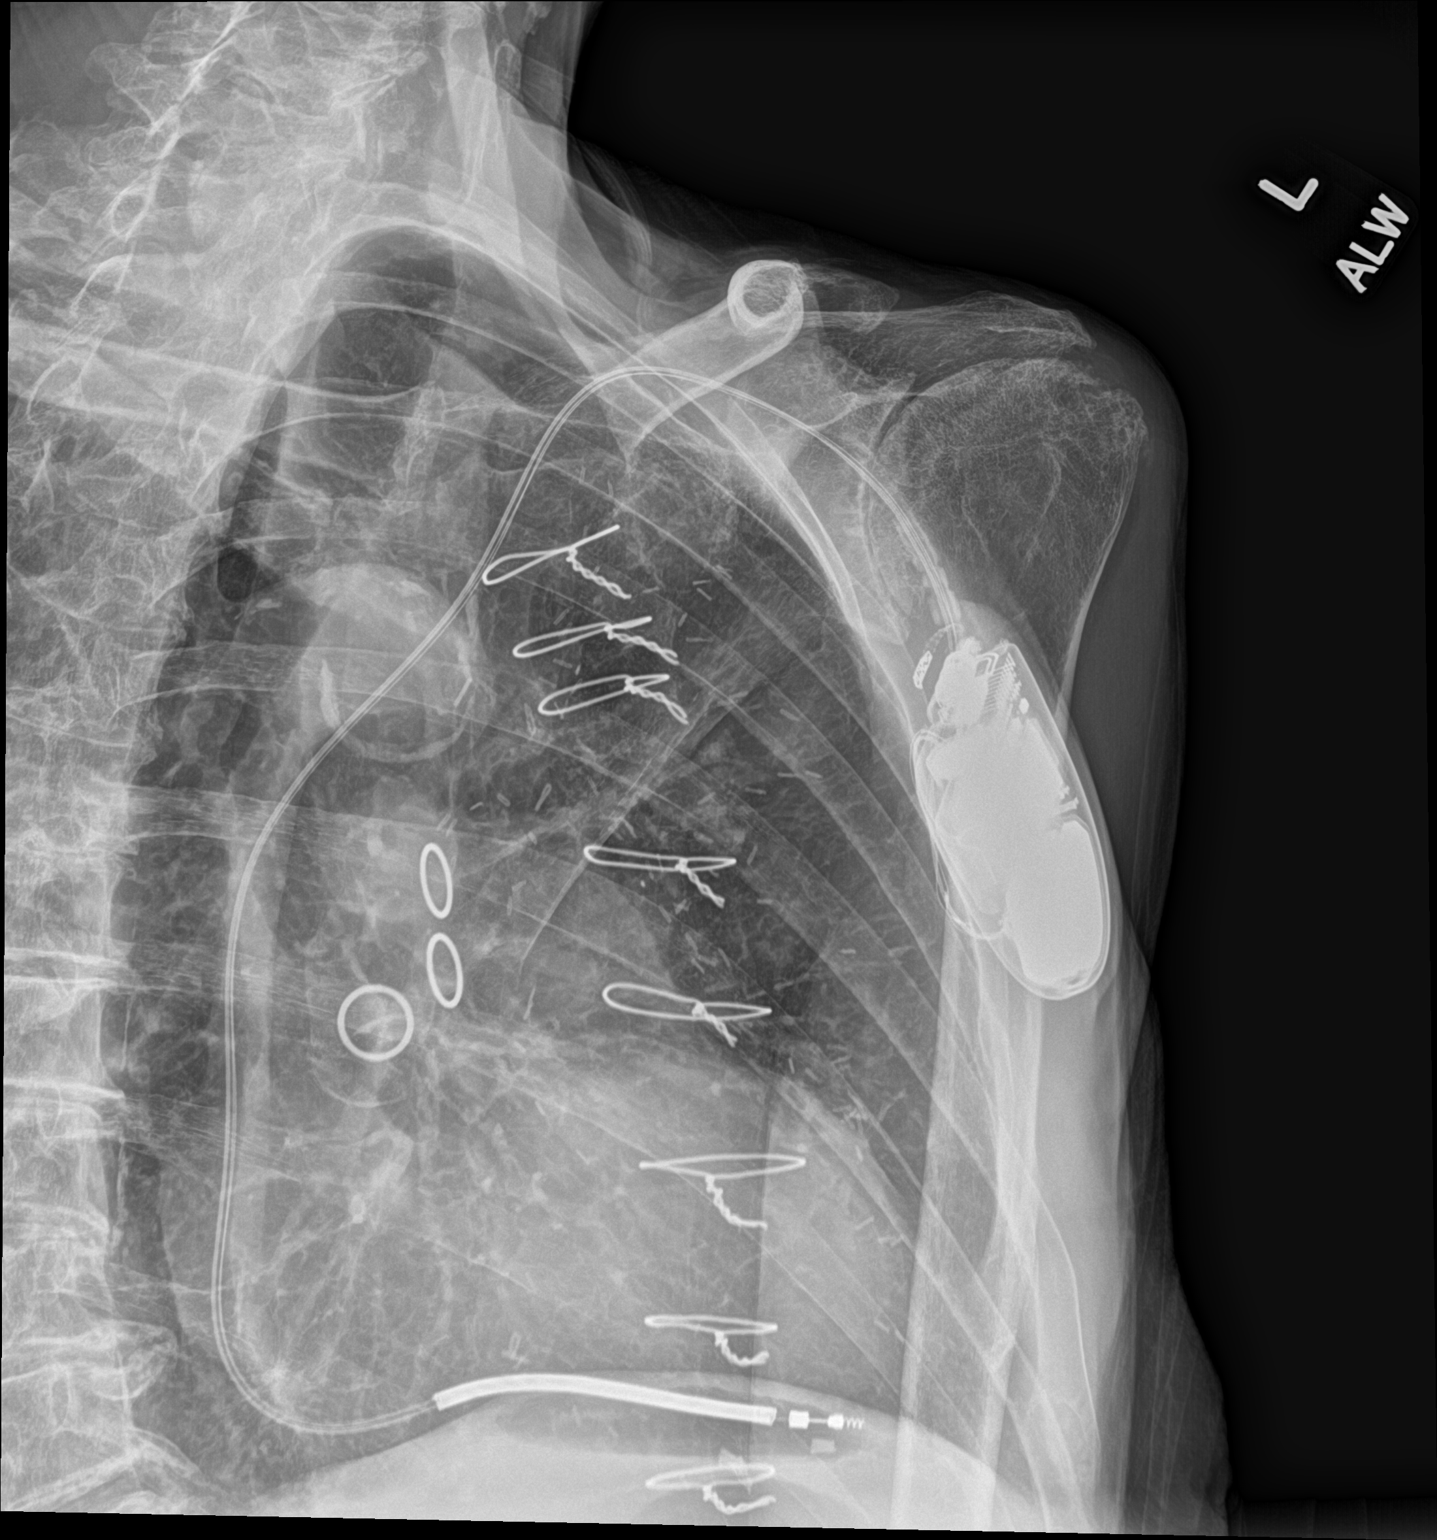

[shoulder y view]
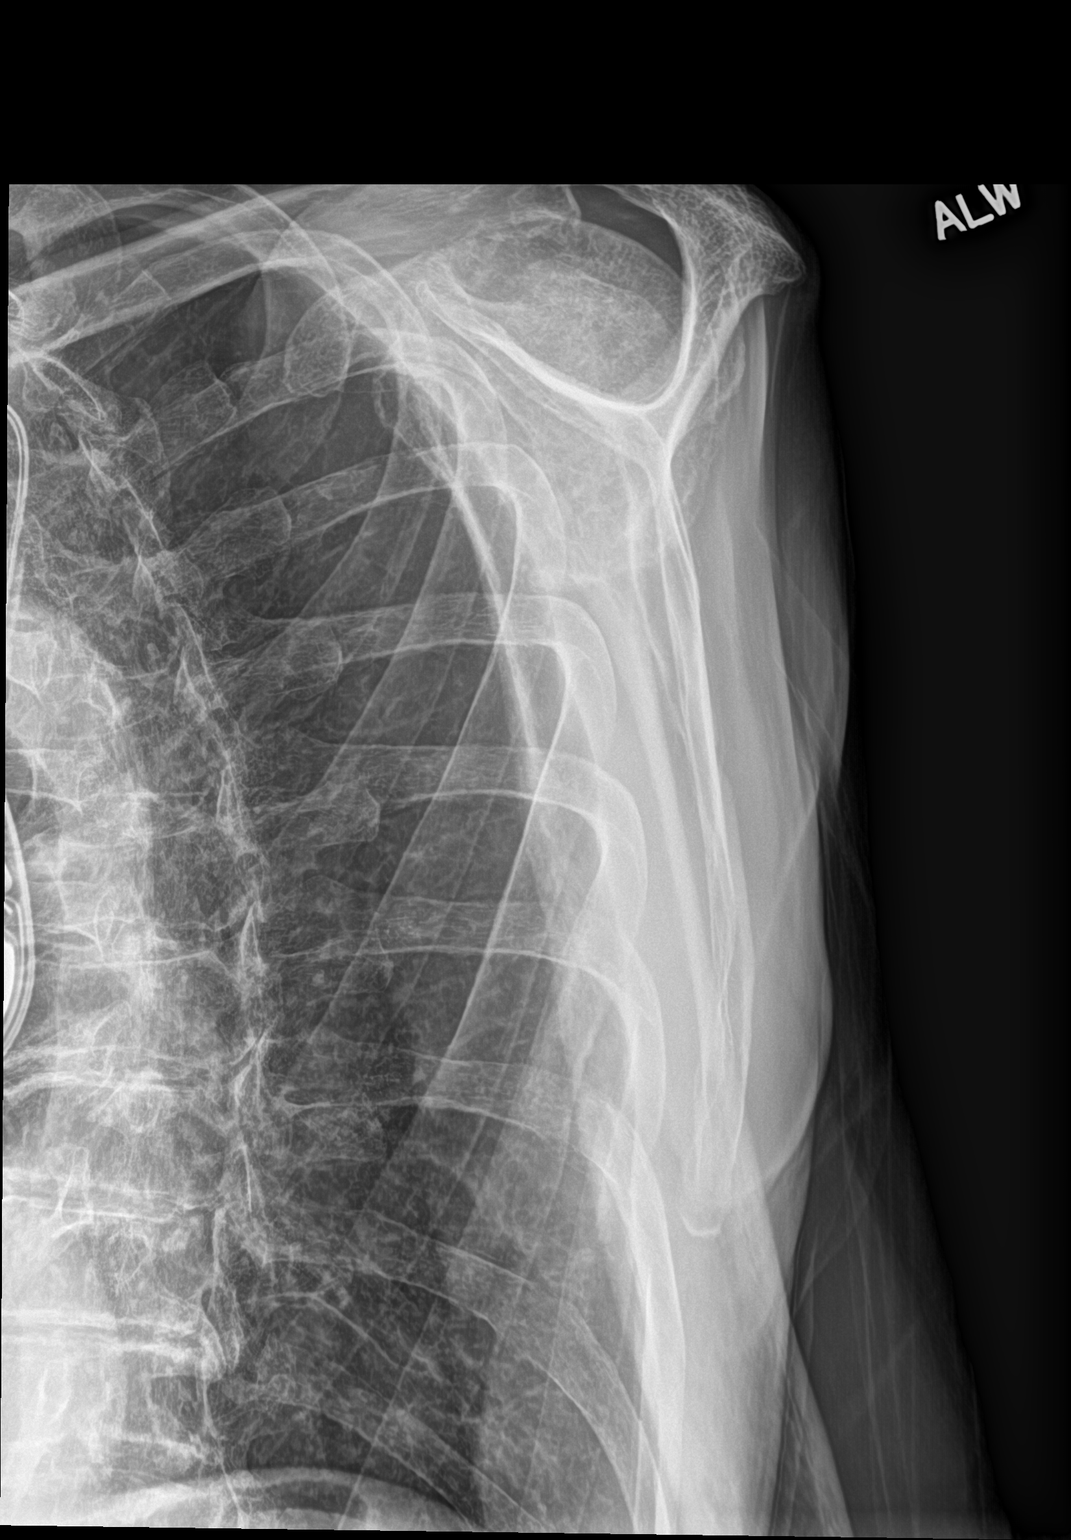

[shoulder axillary]
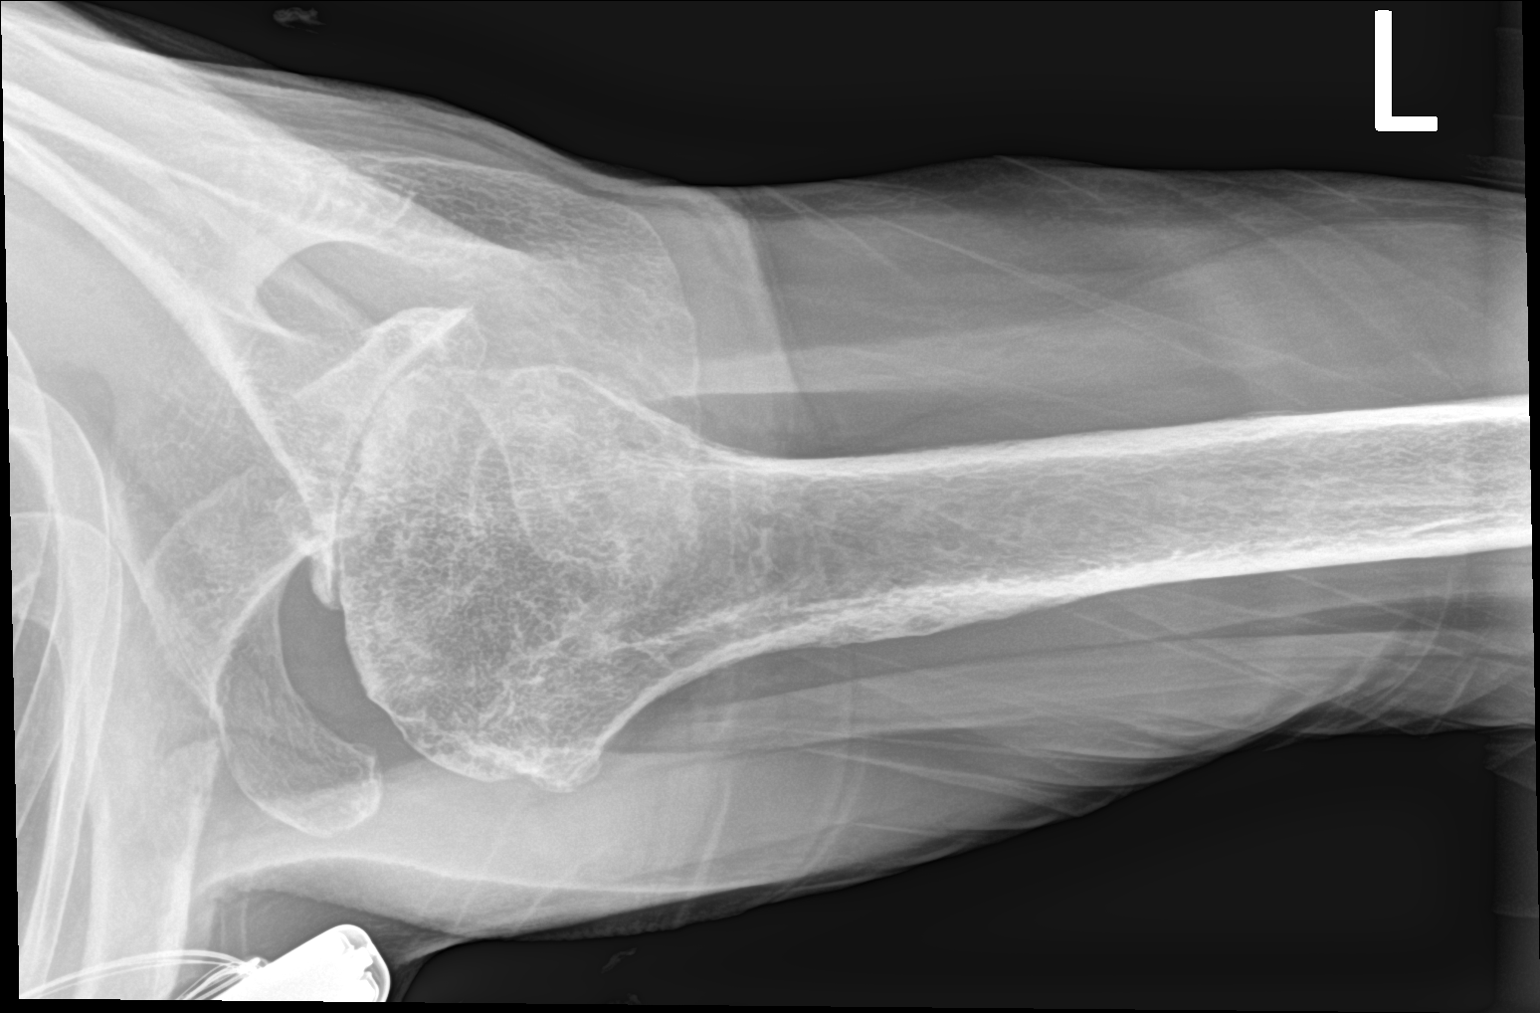

[3 of 3 positions shown; findings below may reference images not displayed]

FINDINGS: Sternotomy and left-sided pacing device. No fracture or
malalignment. High-riding humeral head consistent with rotator cuff
disease. AC joint appears widened, possibly due to prior
postsurgical change. Advanced arthritis of the glenohumeral joint
with bone on bone appearance.
IMPRESSION: Advanced arthritis at the glenohumeral joint. High-riding humeral
head consistent with rotator cuff disease

## 2023-01-28 IMAGING — DX DG SHOULDER 2+V*R*
3 series · 3 of 3 positions shown · non-contrast
Comparison: None.

CLINICAL DATA: Bilateral shoulder pain

EXAM:
RIGHT SHOULDER - 2+ VIEW

[shoulder grashey]
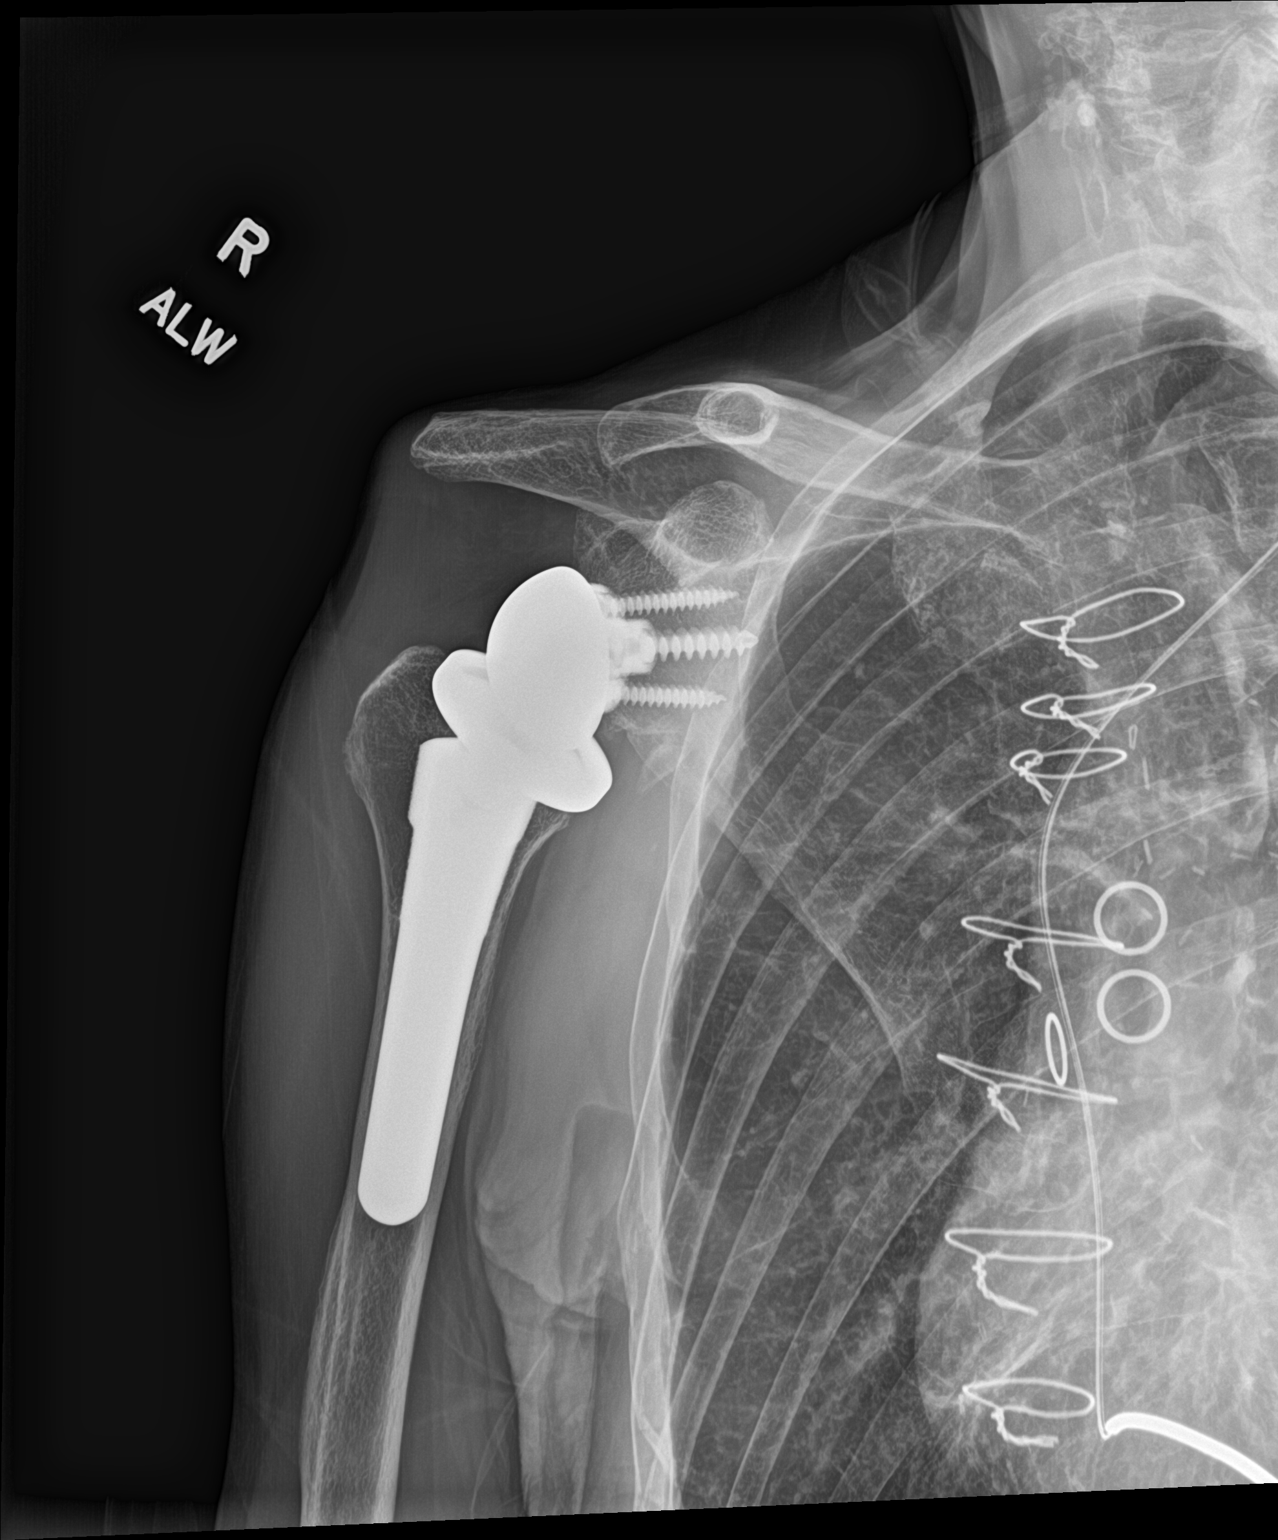

[shoulder y view]
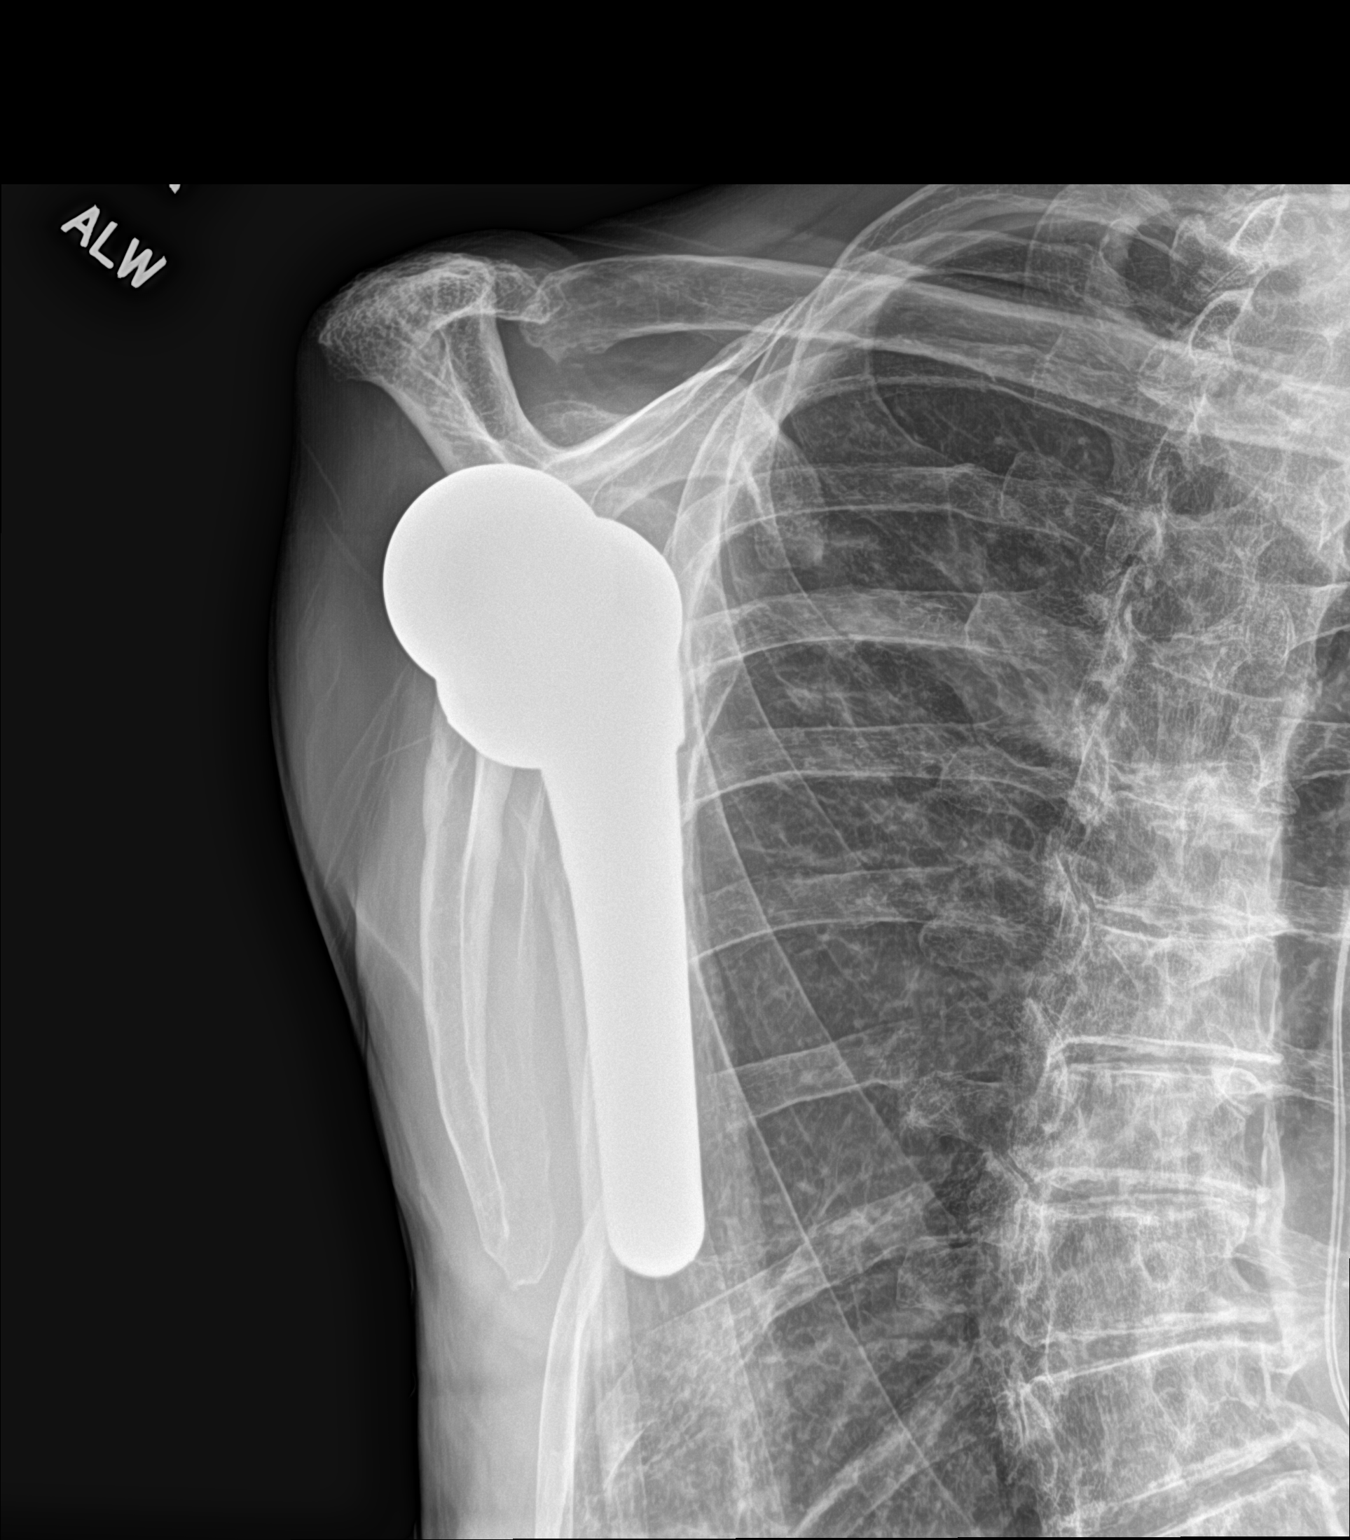

[shoulder axillary]
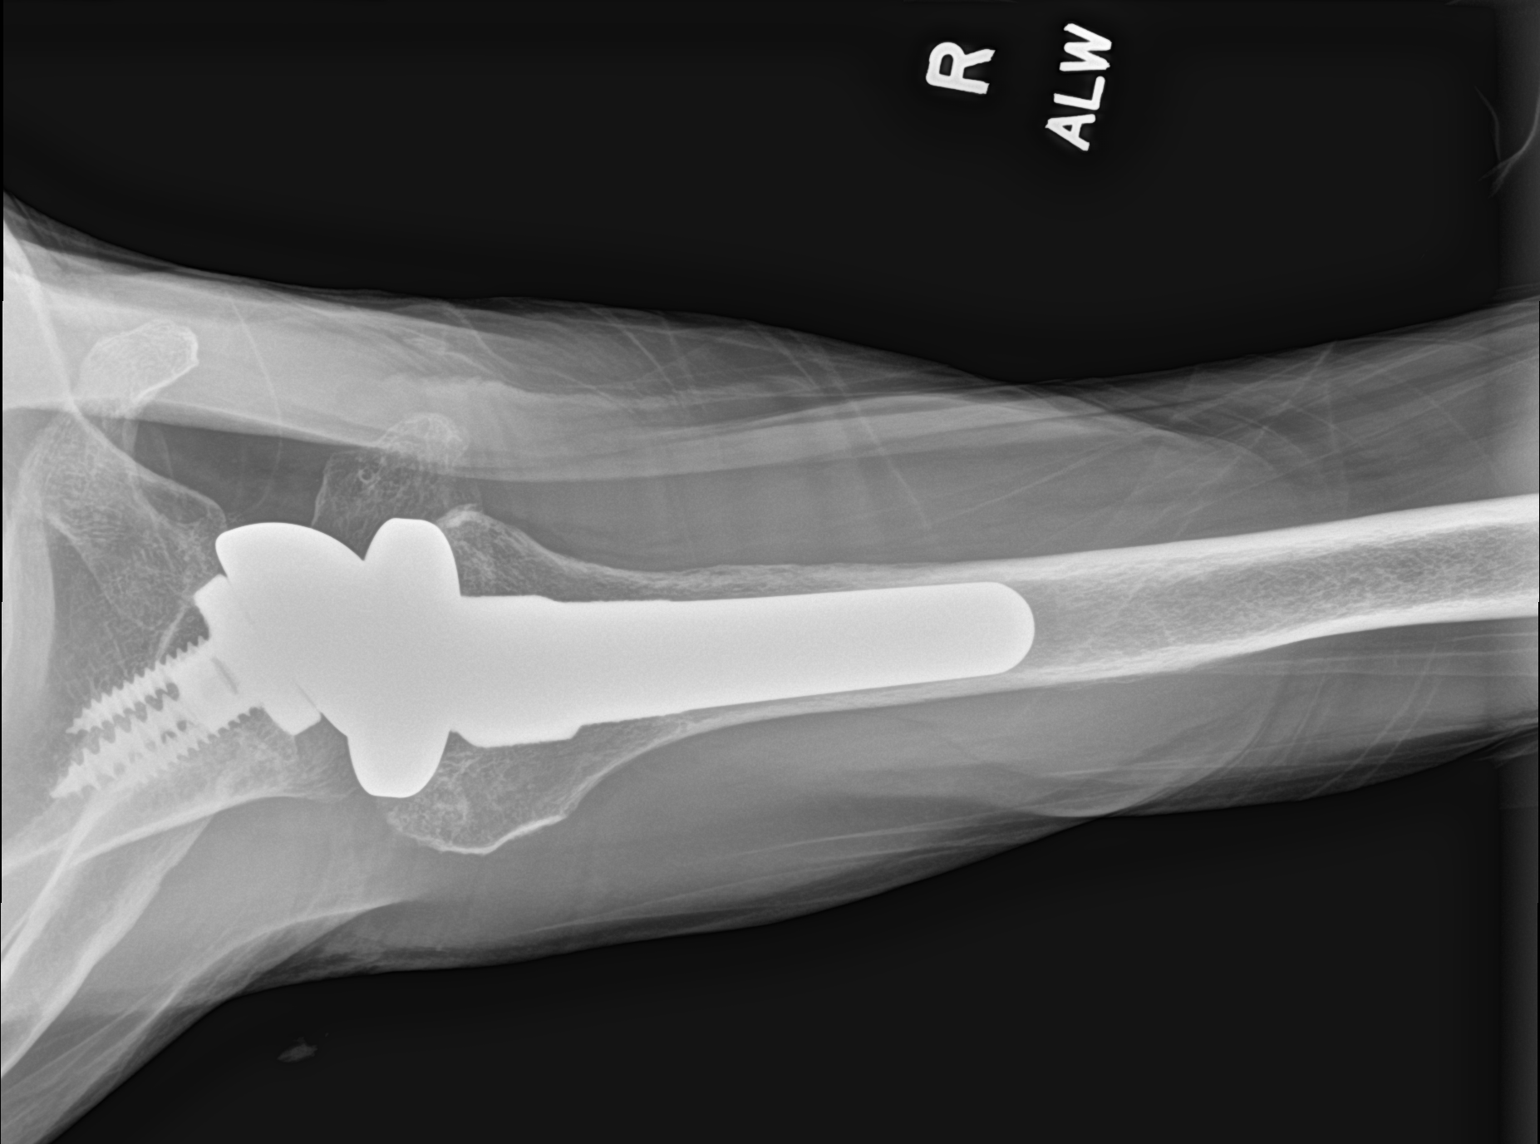

[3 of 3 positions shown; findings below may reference images not displayed]

FINDINGS: Mild AC joint degenerative change. Reverse right shoulder
replacement with intact hardware and normal alignment.
IMPRESSION: Right shoulder replacement without acute osseous abnormality

## 2023-01-30 ENCOUNTER — Other Ambulatory Visit: Payer: Self-pay | Admitting: Family Medicine

## 2023-01-30 ENCOUNTER — Other Ambulatory Visit: Payer: Self-pay | Admitting: Nurse Practitioner

## 2023-01-30 DIAGNOSIS — K219 Gastro-esophageal reflux disease without esophagitis: Secondary | ICD-10-CM

## 2023-01-30 MED ORDER — OMEPRAZOLE 40 MG PO CPDR
DELAYED_RELEASE_CAPSULE | ORAL | 0 refills | Status: DC
Start: 2023-01-30 — End: 2023-04-29

## 2023-02-07 ENCOUNTER — Other Ambulatory Visit: Payer: Self-pay | Admitting: Family Medicine

## 2023-03-12 ENCOUNTER — Ambulatory Visit (INDEPENDENT_AMBULATORY_CARE_PROVIDER_SITE_OTHER): Payer: Medicare PPO

## 2023-03-12 ENCOUNTER — Encounter: Payer: Medicare PPO | Admitting: Student in an Organized Health Care Education/Training Program

## 2023-03-12 DIAGNOSIS — I255 Ischemic cardiomyopathy: Secondary | ICD-10-CM

## 2023-03-13 LAB — CUP PACEART REMOTE DEVICE CHECK
Battery Remaining Longevity: 35 mo
Battery Voltage: 2.96 V
Brady Statistic RV Percent Paced: 14.63 %
Date Time Interrogation Session: 20241023074227
HighPow Impedance: 64 Ohm
Implantable Lead Connection Status: 753985
Implantable Lead Implant Date: 20180209
Implantable Lead Location: 753860
Implantable Lead Model: 6935
Implantable Pulse Generator Implant Date: 20180209
Lead Channel Impedance Value: 285 Ohm
Lead Channel Impedance Value: 304 Ohm
Lead Channel Sensing Intrinsic Amplitude: 2.5 mV
Lead Channel Sensing Intrinsic Amplitude: 2.5 mV
Lead Channel Setting Pacing Amplitude: 2 V
Lead Channel Setting Pacing Pulse Width: 0.5 ms
Lead Channel Setting Sensing Sensitivity: 0.3 mV
Zone Setting Status: 755011

## 2023-03-19 ENCOUNTER — Encounter: Payer: Medicare PPO | Admitting: Student in an Organized Health Care Education/Training Program

## 2023-03-26 ENCOUNTER — Encounter: Payer: Self-pay | Admitting: Student in an Organized Health Care Education/Training Program

## 2023-03-26 ENCOUNTER — Ambulatory Visit
Payer: Medicare PPO | Attending: Student in an Organized Health Care Education/Training Program | Admitting: Student in an Organized Health Care Education/Training Program

## 2023-03-26 VITALS — BP 149/72 | HR 47 | Temp 98.0°F | Resp 16 | Ht 66.0 in | Wt 120.0 lb

## 2023-03-26 DIAGNOSIS — M4125 Other idiopathic scoliosis, thoracolumbar region: Secondary | ICD-10-CM | POA: Insufficient documentation

## 2023-03-26 DIAGNOSIS — G629 Polyneuropathy, unspecified: Secondary | ICD-10-CM | POA: Diagnosis not present

## 2023-03-26 DIAGNOSIS — Z79891 Long term (current) use of opiate analgesic: Secondary | ICD-10-CM | POA: Insufficient documentation

## 2023-03-26 DIAGNOSIS — M47816 Spondylosis without myelopathy or radiculopathy, lumbar region: Secondary | ICD-10-CM | POA: Diagnosis not present

## 2023-03-26 DIAGNOSIS — M5416 Radiculopathy, lumbar region: Secondary | ICD-10-CM | POA: Diagnosis not present

## 2023-03-26 DIAGNOSIS — Z9889 Other specified postprocedural states: Secondary | ICD-10-CM | POA: Insufficient documentation

## 2023-03-26 DIAGNOSIS — G5703 Lesion of sciatic nerve, bilateral lower limbs: Secondary | ICD-10-CM | POA: Insufficient documentation

## 2023-03-26 DIAGNOSIS — M461 Sacroiliitis, not elsewhere classified: Secondary | ICD-10-CM | POA: Diagnosis not present

## 2023-03-26 DIAGNOSIS — M4726 Other spondylosis with radiculopathy, lumbar region: Secondary | ICD-10-CM | POA: Diagnosis not present

## 2023-03-26 DIAGNOSIS — G8929 Other chronic pain: Secondary | ICD-10-CM | POA: Diagnosis not present

## 2023-03-26 DIAGNOSIS — G894 Chronic pain syndrome: Secondary | ICD-10-CM | POA: Insufficient documentation

## 2023-03-26 MED ORDER — OXYCODONE-ACETAMINOPHEN 10-325 MG PO TABS: 1.0000 | ORAL_TABLET | Freq: Three times a day (TID) | ORAL | 0 refills | Status: AC | PRN

## 2023-03-26 MED ORDER — BUPRENORPHINE 20 MCG/HR TD PTWK: 1.0000 | MEDICATED_PATCH | TRANSDERMAL | 2 refills | Status: AC

## 2023-03-26 MED ORDER — GABAPENTIN 300 MG PO CAPS: 900.0000 mg | ORAL_CAPSULE | Freq: Every day | ORAL | 5 refills | Status: DC

## 2023-03-26 MED ORDER — OXYCODONE-ACETAMINOPHEN 10-325 MG PO TABS
1.0000 | ORAL_TABLET | Freq: Three times a day (TID) | ORAL | 0 refills | Status: DC | PRN
Start: 2023-05-25 — End: 2023-06-20

## 2023-03-26 NOTE — Progress Notes (Signed)
Nursing Pain Medication Assessment:  Safety precautions to be maintained throughout the outpatient stay will include: orient to surroundings, keep bed in low position, maintain call bell within reach at all times, provide assistance with transfer out of bed and ambulation.  Medication Inspection Compliance: Pill count conducted under aseptic conditions, in front of the patient. Neither the pills nor the bottle was removed from the patient's sight at any time. Once count was completed pills were immediately returned to the patient in their original bottle.  Medication: Oxycodone/APAP Pill/Patch Count:  2 of 90 pills remain Pill/Patch Appearance: Markings consistent with prescribed medication Bottle Appearance: Standard pharmacy container. Clearly labeled. Filled Date: 10 / 07 / 2024 Last Medication intake:  Today  Buprenorphine 0/4 patches remain Patient has patch on arm at current time Filled 02/22/2023

## 2023-03-26 NOTE — Patient Instructions (Addendum)
Right L3/4 ESI, in clinic (PO Valium)- stop Eliquis 3 days prior   ______________________________________________________________________    Preparing for your procedure  Appointments: If you think you may not be able to keep your appointment, call 24-48 hours in advance to cancel. We need time to make it available to others.  During your procedure appointment there will be: No Prescription Refills. No disability issues to discussed. No medication changes or discussions.  Instructions: Food intake: Avoid eating anything solid for at least 8 hours prior to your procedure. Clear liquid intake: You may take clear liquids such as water up to 2 hours prior to your procedure. (No carbonated drinks. No soda.) Transportation: Unless otherwise stated by your physician, bring a driver. (Driver cannot be a Market researcher, Pharmacist, community, or any other form of public transportation.) Morning Medicines: Except for blood thinners, take all of your other morning medications with a sip of water. Make sure to take your heart and blood pressure medicines. If your blood pressure's lower number is above 100, the case will be rescheduled. Blood thinners: Make sure to stop your blood thinners as instructed.  If you take a blood thinner, but were not instructed to stop it, call our office [phone] and ask to talk to a nurse. Not stopping a blood thinner prior to certain procedures could lead to serious complications. Diabetics on insulin: Notify the staff so that you can be scheduled 1st case in the morning. If your diabetes requires high dose insulin, take only  of your normal insulin dose the morning of the procedure and notify the staff that you have done so. Preventing infections: Shower with an antibacterial soap the morning of your procedure.  Build-up your immune system: Take 1000 mg of Vitamin C with every meal (3 times a day) the day prior to your procedure. Antibiotics: Inform the nursing staff if you are taking any  antibiotics or if you have any conditions that may require antibiotics prior to procedures. (Example: recent joint implants)   Pregnancy: If you are pregnant make sure to notify the nursing staff. Not doing so may result in injury to the fetus, including death.  Sickness: If you have a cold, fever, or any active infections, call and cancel or reschedule your procedure. Receiving steroids while having an infection may result in complications. Arrival: You must be in the facility at least 30 minutes prior to your scheduled procedure. Tardiness: Your scheduled time is also the cutoff time. If you do not arrive at least 15 minutes prior to your procedure, you will be rescheduled.  Children: Do not bring any children with you. Make arrangements to keep them home. Dress appropriately: There is always a possibility that your clothing may get soiled. Avoid long dresses. Valuables: Do not bring any jewelry or valuables.  Reasons to call and reschedule or cancel your procedure: (Following these recommendations will minimize the risk of a serious complication.) Surgeries: Avoid having procedures within 2 weeks of any surgery. (Avoid for 2 weeks before or after any surgery). Flu Shots: Avoid having procedures within 2 weeks of a flu shots or . (Avoid for 2 weeks before or after immunizations). Barium: Avoid having a procedure within 7-10 days after having had a radiological study involving the use of radiological contrast. (Myelograms, Barium swallow or enema study). Heart attacks: Avoid any elective procedures or surgeries for the initial 6 months after a "Myocardial Infarction" (Heart Attack). Blood thinners: It is imperative that you stop these medications before procedures. Let us know if  you if you take any blood thinner.  Infection: Avoid procedures during or within two weeks of an infection (including chest colds or gastrointestinal problems). Symptoms associated with infections include: Localized redness,  fever, chills, night sweats or profuse sweating, burning sensation when voiding, cough, congestion, stuffiness, runny nose, sore throat, diarrhea, nausea, vomiting, cold or Flu symptoms, recent or current infections. It is specially important if the infection is over the area that we intend to treat. Heart and lung problems: Symptoms that may suggest an active cardiopulmonary problem include: cough, chest pain, breathing difficulties or shortness of breath, dizziness, ankle swelling, uncontrolled high or unusually low blood pressure, and/or palpitations. If you are experiencing any of these symptoms, cancel your procedure and contact your primary care physician for an evaluation.  Remember:  Regular Business hours are:  Monday to Thursday 8:00 AM to 4:00 PM  Provider's Schedule: Delano Metz, MD:  Procedure days: Tuesday and Thursday 7:30 AM to 4:00 PM  Edward Jolly, MD:  Procedure days: Monday and Wednesday 7:30 AM to 4:00 PM Last  Updated: 01/08/2023 ______________________________________________________________________    Epidural Steroid Injection  An epidural steroid injection is a shot of steroid medicine, also called cortisone, and a numbing medicine that is given into the epidural space. This space is between the spinal cord and the bones of the back. This shot helps relieve pain caused by an irritated or swollen nerve root. The pain relief you get from the injection depends on the cause of your condition and how long your pain lasts. You may have a period of slightly more pain after your injection, before the steroid medicine takes effect. This medicine usually starts working within 1-3 days. In some cases, you might need 7-10 days to feel the full effect. Tell your health care provider about: Any allergies you have. All medicines you are taking, including vitamins, herbs, eye drops, creams, and over-the-counter medicines. Any problems you or family members have had with  anesthesia. Any bleeding problems you have. Any surgeries you have had. Any medical conditions you have. Whether you are pregnant or may be pregnant. What are the risks? Your health care provider will talk with you about risks. These may include: Headache. Bleeding. Infection. Allergic reaction to medicines or dyes. Nerve damage. Not being able to move (paralysis). This is rare. What happens before the procedure? Medicines You may be given medicines to lower anxiety. Ask your provider about: Changing or stopping your regular medicines. These include any diabetes medicines or blood thinners you take. Taking medicines such as aspirin and ibuprofen. These medicines can thin your blood. Do not take them unless your provider tells you to. Taking over-the-counter medicines, vitamins, herbs, and supplements. General instructions Follow instructions from your provider about what you may eat and drink. Ask your provider what steps will be taken to help prevent infection. If you will be going home right after the procedure, plan to have a responsible adult: Take you home from the hospital or clinic. You will not be allowed to drive. Care for you for the time you are told. What happens during the procedure?  An IV will be inserted into one of your veins. You may be given a sedative to help you relax. You will be asked to sit or lie on your side. The injection site will be cleaned. An X-ray machine will be used to guide the needle close to the nerve that is causing pain. A needle will be put through your skin into the epidural space.  This may cause you some discomfort. Contrast dye may be injected at the site to make sure that the steroid medicine will be sent to the exact place it needs to go. The steroid medicine and a numbing medicine will be injected into the epidural space for pain relief. The needle will be removed. A bandage (dressing) will be put over the injection site. The  procedure may vary among providers and hospitals. What happens after the procedure? Your blood pressure, heart rate, breathing rate, and blood oxygen level will be monitored until you leave the hospital or clinic. Your IV will be removed. Your arm or leg may feel weak or numb for a few hours. This information is not intended to replace advice given to you by your health care provider. Make sure you discuss any questions you have with your health care provider. Document Revised: 12/15/2021 Document Reviewed: 12/15/2021 [employer] Patient Education  2024 [employer].

## 2023-03-26 NOTE — Progress Notes (Signed)
PROVIDER NOTE: Information contained herein reflects review and annotations entered in association with encounter. Interpretation of such information and data should be left to medically-trained personnel. Information provided to patient can be located elsewhere in the medical record under "Patient Instructions". Document created using STT-dictation technology, any transcriptional errors that may result from process are unintentional.    Patient: Andrew Townsend  Service Category: E/M  Provider: Edward Jolly, MD  DOB: 09-09-1950  DOS: 03/26/2023  Referring Provider: Melida Quitter, PA  MRN: [id number]  Specialty: Interventional Pain Management  PCP: Melida Quitter, PA  Type: Established Patient  Setting: Ambulatory outpatient    Location: Office  Delivery: Face-to-face     HPI  [name], a 72 y.o. year old male, is here today because of his Chronic radicular lumbar pain [M54.16, G89.29]. Mr. Carcione primary complain today is Back Pain (lower)  Pertinent problems: Mr. 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 Pain Assessment: Severity of Chronic pain is reported as a 8 /10. Location: Back Lower, Right, Left/buttocks/hips bilateral, radiates around to right thigh and down leg to shin, right side worse. Onset: More than a month ago. Quality: Aching, Constant, Nagging, Tender, Discomfort, Burning, Sore, Tingling. Timing: Constant. Modifying factor(s): rest, medications make it better. Vitals:  height is 5\' 6"  (1.676 m) and weight is 120 lb (54.4 kg). His  temperature is 98 F (36.7 C). His blood pressure is 149/72 (abnormal) and his pulse is 47 (abnormal). His respiration is 16 and oxygen saturation is 100%.  BMI: Estimated body mass index is 19.37 kg/m as calculated from the following:   Height as of this encounter: 5\' 6"  (1.676 m).   Weight as of this encounter: 120 lb (54.4 kg). Last encounter: 12/18/2022. Last procedure: 01/23/2023.  Reason for encounter: medication management.  Patient is also experiencing increased low back pain with radiation into bilateral legs, right greater than left near dermatomal fashion.  He endorses increased burning and tingling along his right anterior thigh and right anterior shin. We discussed a right lumbar epidural steroid injection.  Final x-rays below show severe lumbar degenerative disc disease with foraminal narrowing as well severe along the right L1-2 and L2-L3 disc and moderate along the right L3-L4 and L4-L5.  Of note, patient has completed a home exercise program in the past without much benefit.  For his epidural, he will need to be off of his Eliquis for 3 days prior to his injection.  Will also refill his medications.  We discussed increasing his gabapentin from 600 mg nightly to 900 mg nightly given increased pain that he is experiencing.  His gabapentin is renally dosed.  Pharmacotherapy Assessment  Analgesic: Percocet 10 mg every 8 hours as needed, quantity 90/month; MME equals 45  Monitoring: [place] PMP: PDMP reviewed during this encounter.       Pharmacotherapy: No side-effects or adverse reactions reported. Compliance: No problems identified. Effectiveness: Clinically acceptable.  Newman Pies, RN  03/26/2023  1:30 PM  Sign when Signing Visit Nursing Pain Medication Assessment:  Safety precautions to be maintained throughout the outpatient stay will include: orient to surroundings, keep bed in low position, maintain call bell  within reach at all times, provide assistance with transfer  out of bed and ambulation.  Medication Inspection Compliance: Pill count conducted under aseptic conditions, in front of the patient. Neither the pills nor the bottle was removed from the patient's sight at any time. Once count was completed pills were immediately returned to the patient in their original bottle.  Medication: Oxycodone/APAP Pill/Patch Count:  2 of 90 pills remain Pill/Patch Appearance: Markings consistent with prescribed medication Bottle Appearance: Standard pharmacy container. Clearly labeled. Filled Date: 10 / 07 / 2024 Last Medication intake:  Today  Buprenorphine 0/4 patches remain Patient has patch on arm at current time Filled 02/22/2023    No results found for: "CBDTHCR" No results found for: "D8THCCBX" No results found for: "D9THCCBX"  UDS:  Summary  Date Value Ref Range Status  12/18/2022 Note  Final    Comment:    ==================================================================== [employer] [facility] (MW) ==================================================================== Test                             Result       Flag       Units  Drug Present and Declared for Prescription Verification   Oxycodone                      3473         EXPECTED   ng/mg creat   Oxymorphone                    1617         EXPECTED   ng/mg creat   Noroxycodone                   4722         EXPECTED   ng/mg creat   Noroxymorphone                 992          EXPECTED   ng/mg creat    Sources of oxycodone are scheduled prescription medications.    Oxymorphone, noroxycodone, and noroxymorphone are expected    metabolites of oxycodone. Oxymorphone is also available as a    scheduled prescription medication.    Buprenorphine                  21           EXPECTED   ng/mg creat   Norbuprenorphine               36           EXPECTED   ng/mg creat    Source of buprenorphine is a scheduled prescription medication.    Norbuprenorphine is an expected metabolite of  buprenorphine.  ==================================================================== Test                      Result    Flag   Units      Ref Range   Creatinine              89               mg/dL      >=70 ==================================================================== Declared Medications:  The flagging and interpretation on this report are based on the  following declared medications.  Unexpected results may arise from  inaccuracies in the declared medications.   **Note: The testing scope of this panel includes  these medications:   Oxycodone (Percocet)   **Note: The testing scope of this panel does not include small to  moderate amounts of these reported medications:   Buprenorphine Patch (BuTrans)   **Note: The testing scope of this panel does not include the  following reported medications:   Acetaminophen (Percocet)  Albuterol  Allopurinol  Apixaban (Eliquis)  Empagliflozin (Jardiance)  Ezetimibe (Zetia)  Fluticasone (Trelegy)  Furosemide (Lasix)  Gabapentin (Neurontin)  Magnesium (Mag-Ox)  Metoprolol  Multivitamin  Nitroglycerin (Nitrostat)  Omeprazole  Oxybutynin  Potassium (Klor-Con)  Rosuvastatin  Tizanidine  Umeclidinium (Trelegy)  Vilanterol (Trelegy)  Zolpidem (Ambien) ==================================================================== For clinical consultation, please call [phone]. ====================================================================       ROS  Constitutional: Denies any fever or chills Gastrointestinal: No reported hemesis, hematochezia, vomiting, or acute GI distress Musculoskeletal:  Low back pain with radiation into right leg Neurological:  Numbness and tingling of right thigh and right leg  Medication Review  Fluticasone-Umeclidin-Vilant, Magnesium Oxide, albuterol, allopurinol, apixaban, buprenorphine, empagliflozin, ezetimibe, furosemide, gabapentin, metoprolol succinate, multivitamin, nitroGLYCERIN,  omeprazole, oxyCODONE-acetaminophen, oxybutynin, potassium chloride, rosuvastatin, tiZANidine, and zolpidem  History Review  Allergy: Mr. Matassa has No Known Allergies. Drug: Mr. Stradling  reports no history of drug use. Alcohol:  has no history on file for alcohol use. Tobacco:  reports that he has been smoking e-cigarettes. He has never used smokeless tobacco. Social: Mr. Hy  reports that he has been smoking e-cigarettes. He has never used smokeless tobacco. He reports that he does not use drugs. Medical:  has a past medical history of Arthritis, Blood transfusion without reported diagnosis, CAD (coronary artery disease), Cataract, Chronic kidney disease, COPD (chronic obstructive pulmonary disease) (HCC), GERD (gastroesophageal reflux disease), Hyperlipidemia, Hypertension, 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), Myocardial infarction (HCC), and Stroke (HCC). Surgical: Mr. Knoke  has a past surgical history that includes Coronary artery bypass graft and Hernia repair. Family: family history is not on file.  Laboratory Chemistry Profile   Renal Lab Results  Component Value Date   BUN 22 11/07/2022   CREATININE 1.42 (H) 11/07/2022   BCR 15 11/07/2022    Hepatic Lab Results  Component Value Date   AST 31 11/07/2022   ALT 33 11/07/2022   ALBUMIN 4.7 11/07/2022   ALKPHOS 111 11/07/2022    Electrolytes Lab Results  Component Value Date   NA 140 11/07/2022   K 4.4 11/07/2022   CL 100 11/07/2022   CALCIUM 10.0 11/07/2022   MG 2.1 07/31/2022    Bone Lab Results  Component Value Date   VD25OH 64.6 11/07/2022    Inflammation (CRP: Acute Phase) (ESR: Chronic Phase) Lab Results  Component Value Date   CRP 18 (H) 06/26/2022   ESRSEDRATE 6 06/26/2022         Note: Above Lab results reviewed.  Recent Imaging Review  CLINICAL DATA:  Chronic lower back pain radiating to the bilateral legs, right-greater-than-left.   EXAM: LUMBAR SPINE - COMPLETE  WITH BENDING VIEWS   COMPARISON:  None Available.   FINDINGS: There is diffuse decreased bone mineralization. There are hypoplastic ribs at the vertebral body considered T12. Distal to this the next 5 vertebral bodies are considered L1 through L5. The superior aspect of the iliac crests is at the level of the mid to superior and mid to inferior aspect of the L4 vertebral body on lateral view.   There is 6 mm retrolisthesis of L1 on L2, unchanged on neutral, flexion, and extension views. Moderate to severe levocurvature centered at L2.  Severe right L1-2 and L2-3 and moderate right L3-4 and L4-5 disc space narrowing with multilevel degenerative endplate osteophytes. Moderate to severe L5-S1 disc space narrowing with vacuum phenomenon.   Facet joint arthropathy is greatest at L5-S1.   Partial visualization of cardiac AICD leads.   Hernia mesh coils overlie the pelvis.   Moderate atherosclerotic calcifications within the abdominal aorta. A vascular stent overlies the L1-2 disc level.   IMPRESSION: 1. Moderate to severe levocurvature centered at L2. 2. Severe multilevel degenerative disc and endplate changes. 3. Mild retrolisthesis of L1 on L2.     Electronically Signed   By: Neita Garnet M.D.   On: 12/08/2021 11:18  Physical Exam  General appearance: Well nourished, well developed, and well hydrated. In no apparent acute distress Mental status: Alert, oriented x 3 (person, place, & time)       Respiratory: No evidence of acute respiratory distress Eyes: PERLA Vitals: BP (!) 149/72   Pulse (!) 47   Temp 98 F (36.7 C)   Resp 16   Ht 5\' 6"  (1.676 m)   Wt 120 lb (54.4 kg)   SpO2 100%   BMI 19.37 kg/m  BMI: Estimated body mass index is 19.37 kg/m as calculated from the following:   Height as of this encounter: 5\' 6"  (1.676 m).   Weight as of this encounter: 120 lb (54.4 kg). Ideal: Ideal body weight: 63.8 kg (140 lb 10.5 oz)  Lumbar Spine Area Exam  Skin &  Axial Inspection: No masses, redness, or swelling Alignment: Symmetrical Functional ROM: Pain restricted ROM affecting primarily the right Stability: No instability detected Muscle Tone/Strength: Functionally intact. No obvious neuro-muscular anomalies detected. Sensory (Neurological): Dermatomal pain pattern right L3/4 Palpation: No palpable anomalies       Provocative Tests: Hyperextension/rotation test: (+) due to pain. Lumbar quadrant test (Kemp's test): (+) on the right for foraminal stenosis  Gait & Posture Assessment  Ambulation: Unassisted Gait: Relatively normal for age and body habitus Posture: WNL  Lower Extremity Exam    Side: Right lower extremity  Side: Left lower extremity  Stability: No instability observed          Stability: No instability observed          Skin & Extremity Inspection: Skin color, temperature, and hair growth are WNL. No peripheral edema or cyanosis. No masses, redness, swelling, asymmetry, or associated skin lesions. No contractures.  Skin & Extremity Inspection: Skin color, temperature, and hair growth are WNL. No peripheral edema or cyanosis. No masses, redness, swelling, asymmetry, or associated skin lesions. No contractures.  Functional ROM: Pain restricted ROM for hip and knee joints          Functional ROM: Unrestricted ROM                  Muscle Tone/Strength: Functionally intact. No obvious neuro-muscular anomalies detected.  Muscle Tone/Strength: Functionally intact. No obvious neuro-muscular anomalies detected.  Sensory (Neurological): Neurogenic pain pattern        Sensory (Neurological): Unimpaired        DTR: Patellar: deferred today Achilles: deferred today Plantar: deferred today  DTR: Patellar: deferred today Achilles: deferred today Plantar: deferred today  Palpation: No palpable anomalies  Palpation: No palpable anomalies    Assessment   Diagnosis Status  1. Chronic radicular lumbar pain   2. Lumbar radiculopathy   3.  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 Piriformis syndrome of both sides   5. Chronic pain  syndrome   6. Lumbar facet arthropathy   7. Encounter for long-term opiate analgesic use   8. Neuropathy   9. Other idiopathic scoliosis, thoracolumbar region   10. Hx of shoulder surgery (left rotator cuff surgery, right rotator cuff then reverse total shoulder arthoplasty)    Having a Flare-up Having a Flare-up Controlled   Updated Problems: Problem  Lumbar Radiculopathy    Plan of Care    Mr. Donell Tomkins has a current medication list which includes the following long-term medication(s): albuterol, albuterol, allopurinol, eliquis, ezetimibe, furosemide, nitroglycerin, omeprazole, potassium chloride, rosuvastatin, tizanidine, zolpidem, gabapentin, and metoprolol succinate.  Pharmacotherapy (Medications Ordered): Meds ordered this encounter  Medications   oxyCODONE-acetaminophen (PERCOCET) 10-325 MG tablet    Sig: Take 1 tablet by mouth every 8 (eight) hours as needed for pain.    Dispense:  90 tablet    Refill:  0   oxyCODONE-acetaminophen (PERCOCET) 10-325 MG tablet    Sig: Take 1 tablet by mouth every 8 (eight) hours as needed for pain.    Dispense:  90 tablet    Refill:  0   oxyCODONE-acetaminophen (PERCOCET) 10-325 MG tablet    Sig: Take 1 tablet by mouth every 8 (eight) hours as needed for pain.    Dispense:  90 tablet    Refill:  0   gabapentin (NEURONTIN) 300 MG capsule    Sig: Take 3 capsules (900 mg total) by mouth at bedtime.    Dispense:  90 capsule    Refill:  5   buprenorphine (BUTRANS) 20 MCG/HR PTWK    Sig: Place 1 patch onto the skin once a week.    Dispense:  4 patch    Refill:  2    Chronic Pain: STOP Act (Not applicable) Fill 1 day early if closed on refill date. Avoid benzodiazepines within 8 hours of opioids   Orders:  Orders Placed This Encounter  Procedures   Lumbar Epidural Injection    Standing Status:   Future    Standing Expiration  Date:   06/26/2023    Scheduling Instructions:     Procedure: Interlaminar Lumbar Epidural Steroid injection (LESI)            Laterality: Right L3-4     Sedation: with VALIUM     Timeframe: 2 weeks     STOP ELIQUIS 3 days prior    Order Specific Question:   Where will this procedure be performed?    Answer:   [facility]   Follow-up plan:   Return in about 15 days (around 04/10/2023) for Right L3/4 ESI, in clinic (PO Valium)- stop Eliquis 3 days prior.      B/L SI joint and piriformis 01/03/2022, 02/07/22     Recent Visits Date Type Provider Dept  01/23/23 Procedure visit Edward Jolly, MD [facility]  01/02/23 Procedure visit Edward Jolly, MD [facility]  Showing recent visits within past 90 days and meeting all other requirements Today's Visits Date Type Provider Dept  03/26/23 Office Visit Edward Jolly, MD [facility]  Showing today's visits and meeting all other requirements Future Appointments No visits were found meeting these conditions. Showing future appointments within next 90 days and meeting all other requirements  I discussed the assessment and treatment plan with the patient. The patient was provided an opportunity to ask questions and all were answered. The patient agreed with the plan and demonstrated an understanding of the instructions.  Patient advised to call back or seek an  in-person evaluation if the symptoms or condition worsens.  Duration of encounter: 30 minutes.  Total time on encounter, as per AMA guidelines included both the face-to-face and non-face-to-face time personally spent by the physician and/or other qualified health care professional(s) on the day of the encounter (includes time in activities that require the physician or other qualified health care professional and does not include time in activities normally performed by clinical staff). Physician's time may include the following activities when  performed: Preparing to see the patient (e.g., pre-charting review of records, searching for previously ordered imaging, lab work, and nerve conduction tests) Review of prior analgesic pharmacotherapies. Reviewing PMP Interpreting ordered tests (e.g., lab work, imaging, nerve conduction tests) Performing post-procedure evaluations, including interpretation of diagnostic procedures Obtaining and/or reviewing separately obtained history Performing a medically appropriate examination and/or evaluation Counseling and educating the patient/family/caregiver Ordering medications, tests, or procedures Referring and communicating with other health care professionals (when not separately reported) Documenting clinical information in the electronic or other health record Independently interpreting results (not separately reported) and communicating results to the patient/ family/caregiver Care coordination (not separately reported)  Note by: Edward Jolly, MD Date: 03/26/2023; Time: 1:57 PM

## 2023-04-01 NOTE — Progress Notes (Signed)
Remote ICD transmission.   

## 2023-04-10 ENCOUNTER — Ambulatory Visit
Admission: RE | Admit: 2023-04-10 | Discharge: 2023-04-10 | Disposition: A | Discharge status: Home / Self Care | Payer: Medicare PPO | Source: Ambulatory Visit | Attending: Student in an Organized Health Care Education/Training Program | Admitting: Student in an Organized Health Care Education/Training Program

## 2023-04-10 ENCOUNTER — Ambulatory Visit
Payer: Medicare PPO | Attending: Student in an Organized Health Care Education/Training Program | Admitting: Student in an Organized Health Care Education/Training Program

## 2023-04-10 ENCOUNTER — Encounter: Payer: Self-pay | Admitting: Student in an Organized Health Care Education/Training Program

## 2023-04-10 DIAGNOSIS — M5416 Radiculopathy, lumbar region: Secondary | ICD-10-CM | POA: Insufficient documentation

## 2023-04-10 DIAGNOSIS — G894 Chronic pain syndrome: Secondary | ICD-10-CM | POA: Insufficient documentation

## 2023-04-10 DIAGNOSIS — G8929 Other chronic pain: Secondary | ICD-10-CM | POA: Diagnosis not present

## 2023-04-10 MED ORDER — LIDOCAINE HCL 2 % IJ SOLN
20.0000 mL | Freq: Once | INTRAMUSCULAR | Status: AC
  Administered 2023-04-10: 100 mg
  Filled 2023-04-10: qty 40

## 2023-04-10 MED ORDER — ROPIVACAINE HCL 2 MG/ML IJ SOLN
1.0000 mL | Freq: Once | INTRAMUSCULAR | Status: AC
  Administered 2023-04-10: 1 mL via EPIDURAL
  Filled 2023-04-10: qty 20

## 2023-04-10 MED ORDER — DIAZEPAM 5 MG PO TABS
ORAL_TABLET | ORAL | Status: AC
  Filled 2023-04-10: qty 1

## 2023-04-10 MED ORDER — DIAZEPAM 5 MG PO TABS
5.0000 mg | ORAL_TABLET | ORAL | Status: AC
  Administered 2023-04-10: 5 mg via ORAL

## 2023-04-10 MED ORDER — DEXAMETHASONE SODIUM PHOSPHATE 10 MG/ML IJ SOLN
10.0000 mg | Freq: Once | INTRAMUSCULAR | Status: AC
Start: 2023-04-10 — End: 2023-04-10
  Administered 2023-04-10: 10 mg
  Filled 2023-04-10: qty 1

## 2023-04-10 MED ORDER — IOHEXOL 180 MG/ML  SOLN
10.0000 mL | Freq: Once | INTRAMUSCULAR | Status: AC
  Administered 2023-04-10: 10 mL via EPIDURAL
  Filled 2023-04-10: qty 20

## 2023-04-10 MED ORDER — SODIUM CHLORIDE 0.9% FLUSH
1.0000 mL | Freq: Once | INTRAVENOUS | Status: AC
Start: 2023-04-10 — End: 2023-04-10
  Administered 2023-04-10: 1 mL

## 2023-04-10 NOTE — Progress Notes (Signed)
Safety precautions to be maintained throughout the outpatient stay will include: orient to surroundings, keep bed in low position, maintain call bell within reach at all times, provide assistance with transfer out of bed and ambulation.  

## 2023-04-10 NOTE — Patient Instructions (Signed)

## 2023-04-10 NOTE — Progress Notes (Signed)
PROVIDER NOTE: Interpretation of information contained herein should be left to medically-trained personnel. Specific patient instructions are provided elsewhere under "Patient Instructions" section of medical record. This document was created in part using STT-dictation technology, any transcriptional errors that may result from this process are unintentional.  Patient: Andrew Townsend Type: Established DOB: 02-20-1951 MRN: [id number] PCP: Melida Quitter, PA  Service: Procedure DOS: 04/10/2023 Setting: Ambulatory Location: Ambulatory outpatient facility Delivery: Face-to-face Provider: Edward Jolly, MD Specialty: Interventional Pain Management Specialty designation: 09 Location: Outpatient facility Ref. Prov.: Edward Jolly, MD       Interventional Therapy   Type: Lumbar epidural steroid injection (LESI) (interlaminar) #1    Laterality: Right   Level:  L3-4 Level.  Imaging: Fluoroscopic guidance         Anesthesia: Local anesthesia (1-2% Lidocaine) Sedation: Minimal Sedation                       DOS: 04/10/2023  Performed by: Edward Jolly, MD  Purpose: Diagnostic/Therapeutic Indications: Lumbar radicular pain of intraspinal etiology of more than 4 weeks that has failed to respond to conservative therapy and is severe enough to impact quality of life or function. 1. Chronic radicular lumbar pain   2. Lumbar radiculopathy   3. Chronic pain syndrome    NAS-11 Pain score:   Pre-procedure: 7 /10   Post-procedure: 7 /10      Position / Prep / Materials:  Position: Prone w/ head of the table raised (slight reverse trendelenburg) to facilitate breathing.  Prep solution: ChloraPrep (2% chlorhexidine gluconate and 70% isopropyl alcohol) Prep Area: Entire Posterior Lumbar Region from lower scapular tip down to mid buttocks area and from flank to flank. Materials:  Tray: Epidural tray Needle(s):  Type: Epidural needle (Tuohy) Gauge (G):  22 Length: Regular (3.5-in) Qty: 1   H&P  (Pre-op Assessment):  Mr. Dietel is a 72 y.o. (year old), male patient, seen today for interventional treatment. He  has a past surgical history that includes Coronary artery bypass graft and Hernia repair. Mr. Demo has a current medication list which includes the following prescription(s): albuterol, albuterol, allopurinol, eliquis, buprenorphine, empagliflozin, ezetimibe, trelegy ellipta, furosemide, gabapentin, magnesium oxide, multivitamin, nitroglycerin, omeprazole, oxybutynin, oxycodone-acetaminophen, [START ON 04/25/2023] oxycodone-acetaminophen, [START ON 05/25/2023] oxycodone-acetaminophen, potassium chloride, rosuvastatin, tizanidine, zolpidem, and metoprolol succinate. His primarily concern today is the No chief complaint on file.  Initial Vital Signs:  Pulse/HCG Rate: (!) 47ECG Heart Rate: 61 Temp: 98 F (36.7 C) Resp: 16 BP: 136/82 SpO2: 98 %  BMI: Estimated body mass index is 19.37 kg/m as calculated from the following:   Height as of this encounter: 5\' 6"  (1.676 m).   Weight as of this encounter: 120 lb (54.4 kg).  Risk Assessment: Allergies: Reviewed. He has No Known Allergies.  Allergy Precautions: None required Coagulopathies: Reviewed. None identified.  Blood-thinner therapy: None at this time Active Infection(s): Reviewed. None identified. Mr. Blaich is afebrile  Site Confirmation: Mr. Pacifico was asked to confirm the procedure and laterality before marking the site Procedure checklist: Completed Consent: Before the procedure and under the influence of no sedative(s), amnesic(s), or anxiolytics, the patient was informed of the treatment options, risks and possible complications. To fulfill our ethical and legal obligations, as recommended by the American Medical Association's Code of Ethics, I have informed the patient of my clinical impression; the nature and purpose of the treatment or procedure; the risks, benefits, and possible complications of the intervention; the  alternatives, including doing nothing;  the risk(s) and benefit(s) of the alternative treatment(s) or procedure(s); and the risk(s) and benefit(s) of doing nothing. The patient was provided information about the general risks and possible complications associated with the procedure. These may include, but are not limited to: failure to achieve desired goals, infection, bleeding, organ or nerve damage, allergic reactions, paralysis, and death. In addition, the patient was informed of those risks and complications associated to Spine-related procedures, such as failure to decrease pain; infection (i.e.: Meningitis, epidural or intraspinal abscess); bleeding (i.e.: epidural hematoma, subarachnoid hemorrhage, or any other type of intraspinal or peri-dural bleeding); organ or nerve damage (i.e.: Any type of peripheral nerve, nerve root, or spinal cord injury) with subsequent damage to sensory, motor, and/or autonomic systems, resulting in permanent pain, numbness, and/or weakness of one or several areas of the body; allergic reactions; (i.e.: anaphylactic reaction); and/or death. Furthermore, the patient was informed of those risks and complications associated with the medications. These include, but are not limited to: allergic reactions (i.e.: anaphylactic or anaphylactoid reaction(s)); adrenal axis suppression; blood sugar elevation that in diabetics may result in ketoacidosis or comma; water retention that in patients with history of congestive heart failure may result in shortness of breath, pulmonary edema, and decompensation with resultant heart failure; weight gain; swelling or edema; medication-induced neural toxicity; particulate matter embolism and blood vessel occlusion with resultant organ, and/or nervous system infarction; and/or aseptic necrosis of one or more joints. Finally, the patient was informed that Medicine is not an exact science; therefore, there is also the possibility of unforeseen or  unpredictable risks and/or possible complications that may result in a catastrophic outcome. The patient indicated having understood very clearly. We have given the patient no guarantees and we have made no promises. Enough time was given to the patient to ask questions, all of which were answered to the patient's satisfaction. Mr. Quattro has indicated that he wanted to continue with the procedure. Attestation: I, the ordering provider, attest that I have discussed with the patient the benefits, risks, side-effects, alternatives, likelihood of achieving goals, and potential problems during recovery for the procedure that I have provided informed consent. Date  Time: 04/10/2023  9:58 AM   Pre-Procedure Preparation:  Monitoring: As per clinic protocol. Respiration, ETCO2, SpO2, BP, heart rate and rhythm monitor placed and checked for adequate function Safety Precautions: Patient was assessed for positional comfort and pressure points before starting the procedure. Time-out: I initiated and conducted the "Time-out" before starting the procedure, as per protocol. The patient was asked to participate by confirming the accuracy of the "Time Out" information. Verification of the correct person, site, and procedure were performed and confirmed by me, the nursing staff, and the patient. "Time-out" conducted as per Joint Commission's Universal Protocol (UP.01.01.01). Time: 1044 Start Time: 1044 hrs.  Description/Narrative of Procedure:          Target: Epidural space via interlaminar opening, initially targeting the lower laminar border of the superior vertebral body. Region: Lumbar Approach: Percutaneous paravertebral  Rationale (medical necessity): procedure needed and proper for the diagnosis and/or treatment of the patient's medical symptoms and needs. Procedural Technique Safety Precautions: Aspiration looking for blood return was conducted prior to all injections. At no point did we inject any  substances, as a needle was being advanced. No attempts were made at seeking any paresthesias. Safe injection practices and needle disposal techniques used. Medications properly checked for expiration dates. SDV (single dose vial) medications used. Description of the Procedure: Protocol guidelines were followed. The  procedure needle was introduced through the skin, ipsilateral to the reported pain, and advanced to the target area. Bone was contacted and the needle walked caudad, until the lamina was cleared. The epidural space was identified using "loss-of-resistance technique" with 2-3 ml of PF-NaCl (0.9% NSS), in a 5cc LOR glass syringe.  Vitals:   04/10/23 1024 04/10/23 1040 04/10/23 1045 04/10/23 1050  BP: 136/82 (!) 140/82 (!) 154/104 132/68  Pulse: (!) 47     Resp: 16 16 17 15   Temp: 98 F (36.7 C)     TempSrc: Temporal     SpO2: 98% 100% 98% 97%  Weight: 120 lb (54.4 kg)     Height: 5\' 6"  (1.676 m)       Start Time: 1044 hrs. End Time: 1050 hrs.  Imaging Guidance (Spinal):          Type of Imaging Technique: Fluoroscopy Guidance (Spinal) Indication(s): Fluoroscopy guidance for needle placement to enhance accuracy in procedures requiring precise needle localization for targeted delivery of medication in or near specific anatomical locations not easily accessible without such real-time imaging assistance. Exposure Time: Please see nurses notes. Contrast: Before injecting any contrast, we confirmed that the patient did not have an allergy to iodine, shellfish, or radiological contrast. Once satisfactory needle placement was completed at the desired level, radiological contrast was injected. Contrast injected under live fluoroscopy. No contrast complications. See chart for type and volume of contrast used. Fluoroscopic Guidance: I was personally present during the use of fluoroscopy. "Tunnel Vision Technique" used to obtain the best possible view of the target area. Parallax error  corrected before commencing the procedure. "Direction-depth-direction" technique used to introduce the needle under continuous pulsed fluoroscopy. Once target was reached, antero-posterior, oblique, and lateral fluoroscopic projection used confirm needle placement in all planes. Images permanently stored in EMR. Interpretation: I personally interpreted the imaging intraoperatively. Adequate needle placement confirmed in multiple planes. Appropriate spread of contrast into desired area was observed. No evidence of afferent or efferent intravascular uptake. No intrathecal or subarachnoid spread observed. Permanent images saved into the patient's record.  Antibiotic Prophylaxis:   Anti-infectives (From admission, onward)    None      Indication(s): None identified  Post-operative Assessment:  Post-procedure Vital Signs:  Pulse/HCG Rate: (!) 4762 Temp: 98 F (36.7 C) Resp: 15 BP: 132/68 SpO2: 97 %  EBL: None  Complications: No immediate post-treatment complications observed by team, or reported by patient.  Note: The patient tolerated the entire procedure well. A repeat set of vitals were taken after the procedure and the patient was kept under observation following institutional policy, for this type of procedure. Post-procedural neurological assessment was performed, showing return to baseline, prior to discharge. The patient was provided with post-procedure discharge instructions, including a section on how to identify potential problems. Should any problems arise concerning this procedure, the patient was given instructions to immediately contact us, at any time, without hesitation. In any case, we plan to contact the patient by telephone for a follow-up status report regarding this interventional procedure.  Comments:  No additional relevant information.  Plan of Care (POC)  Orders:  Orders Placed This Encounter  Procedures   DG PAIN CLINIC C-ARM 1-60 MIN NO REPORT     Intraoperative interpretation by procedural physician at [facility].    Standing Status:   Standing    Number of Occurrences:   1    Order Specific Question:   Reason for exam:    Answer:  Assistance in needle guidance and placement for procedures requiring needle placement in or near specific anatomical locations not easily accessible without such assistance.    Medications ordered for procedure: Meds ordered this encounter  Medications   iohexol (OMNIPAQUE) 180 MG/ML injection 10 mL    Must be Myelogram-compatible. If not available, you may substitute with a water-soluble, non-ionic, hypoallergenic, myelogram-compatible radiological contrast medium.   lidocaine (XYLOCAINE) 2 % (with pres) injection 400 mg   diazepam (VALIUM) tablet 5 mg    Make sure Flumazenil is available in the pyxis when using this medication. If oversedation occurs, administer 0.2 mg IV over 15 sec. If after 45 sec no response, administer 0.2 mg again over 1 min; may repeat at 1 min intervals; not to exceed 4 doses (1 mg)   sodium chloride flush (NS) 0.9 % injection 1 mL   ropivacaine (PF) 2 mg/mL (0.2%) (NAROPIN) injection 1 mL   dexamethasone (DECADRON) injection 10 mg   Medications administered: We administered iohexol, lidocaine, diazepam, sodium chloride flush, ropivacaine (PF) 2 mg/mL (0.2%), and dexamethasone.  See the medical record for exact dosing, route, and time of administration.  Follow-up plan:   Return for Keep sch. appt.       Recent Visits Date Type Provider Dept  03/26/23 Office Visit Edward Jolly, MD [facility]  01/23/23 Procedure visit Edward Jolly, MD [facility]  Showing recent visits within past 90 days and meeting all other requirements Today's Visits Date Type Provider Dept  04/10/23 Procedure visit Edward Jolly, MD [facility]  Showing today's visits and meeting all other requirements Future Appointments Date Type Provider Dept   06/25/23 Appointment Edward Jolly, MD [facility]  Showing future appointments within next 90 days and meeting all other requirements  Disposition: Discharge home  Discharge (Date  Time): 04/10/2023; 1100 hrs.   Primary Care Physician: Melida Quitter, PA Location: [facility] Note by: Edward Jolly, MD (TTS technology used. I apologize for any typographical errors that were not detected and corrected.) Date: 04/10/2023; Time: 11:04 AM  Disclaimer:  Medicine is not an Visual merchandiser. The only guarantee in medicine is that nothing is guaranteed. It is important to note that the decision to proceed with this intervention was based on the information collected from the patient. The Data and conclusions were drawn from the patient's questionnaire, the interview, and the physical examination. Because the information was provided in large part by the patient, it cannot be guaranteed that it has not been purposely or unconsciously manipulated. Every effort has been made to obtain as much relevant data as possible for this evaluation. It is important to note that the conclusions that lead to this procedure are derived in large part from the available data. Always take into account that the treatment will also be dependent on availability of resources and existing treatment guidelines, considered by other Pain Management Practitioners as being common knowledge and practice, at the time of the intervention. For Medico-Legal purposes, it is also important to point out that variation in procedural techniques and pharmacological choices are the acceptable norm. The indications, contraindications, technique, and results of the above procedure should only be interpreted and judged by a Board-Certified Interventional Pain Specialist with extensive familiarity and expertise in the same exact procedure and technique.

## 2023-04-11 ENCOUNTER — Telehealth: Payer: Self-pay

## 2023-04-11 NOTE — Telephone Encounter (Signed)
Post procedure follow up..  Patient states he is doing good 

## 2023-04-16 ENCOUNTER — Telehealth: Payer: Self-pay | Admitting: *Deleted

## 2023-04-16 DIAGNOSIS — E785 Hyperlipidemia, unspecified: Secondary | ICD-10-CM

## 2023-04-16 DIAGNOSIS — D696 Thrombocytopenia, unspecified: Secondary | ICD-10-CM

## 2023-04-16 DIAGNOSIS — N1832 Chronic kidney disease, stage 3b: Secondary | ICD-10-CM

## 2023-04-16 NOTE — Telephone Encounter (Signed)
Placed orders for labs today which include kidney labs.  Have not received anything else from nephrology regarding specific labs that they need.

## 2023-04-16 NOTE — Telephone Encounter (Signed)
Pt came in to see about getting the lbs that he usually gets for his kidney doctor.  He wanted to know if we had received anything from him about this.  I told him I would check with provider to see if she had received them and if she did or knew what labs he needed we could get him scheduled for these.  He also has an upcoming appt for fasting labs on 12/13 and he wanted to know if he could get those done when he gets his labs for his kidney doctor.  He has an appointment with him [facility] [place]) next week.

## 2023-04-16 NOTE — Telephone Encounter (Signed)
Contacted pt and appointment scheduled for labs

## 2023-04-17 ENCOUNTER — Other Ambulatory Visit: Payer: Medicare PPO

## 2023-04-17 DIAGNOSIS — D696 Thrombocytopenia, unspecified: Secondary | ICD-10-CM

## 2023-04-17 DIAGNOSIS — E785 Hyperlipidemia, unspecified: Secondary | ICD-10-CM

## 2023-04-17 DIAGNOSIS — N1832 Chronic kidney disease, stage 3b: Secondary | ICD-10-CM | POA: Diagnosis not present

## 2023-04-18 LAB — CBC WITH DIFFERENTIAL/PLATELET
Basophils Absolute: 0.1 10*3/uL (ref 0.0–0.2)
Basos: 2 %
EOS (ABSOLUTE): 0.2 10*3/uL (ref 0.0–0.4)
Eos: 2 %
Hematocrit: 48.8 % (ref 37.5–51.0)
Hemoglobin: 15.4 g/dL (ref 13.0–17.7)
Immature Grans (Abs): 0 10*3/uL (ref 0.0–0.1)
Immature Granulocytes: 0 %
Lymphocytes Absolute: 1.8 10*3/uL (ref 0.7–3.1)
Lymphs: 22 %
MCH: 28.9 pg (ref 26.6–33.0)
MCHC: 31.6 g/dL (ref 31.5–35.7)
MCV: 92 fL (ref 79–97)
Monocytes Absolute: 1 10*3/uL — ABNORMAL HIGH (ref 0.1–0.9)
Monocytes: 12 %
Neutrophils Absolute: 5.2 10*3/uL (ref 1.4–7.0)
Neutrophils: 62 %
Platelets: 179 10*3/uL (ref 150–450)
RBC: 5.32 x10E6/uL (ref 4.14–5.80)
RDW: 14.2 % (ref 11.6–15.4)
WBC: 8.2 10*3/uL (ref 3.4–10.8)

## 2023-04-18 LAB — COMPREHENSIVE METABOLIC PANEL
ALT: 32 [IU]/L (ref 0–44)
AST: 26 [IU]/L (ref 0–40)
Albumin: 4.4 g/dL (ref 3.8–4.8)
Alkaline Phosphatase: 99 [IU]/L (ref 44–121)
BUN/Creatinine Ratio: 12 (ref 10–24)
BUN: 17 mg/dL (ref 8–27)
Bilirubin Total: 0.8 mg/dL (ref 0.0–1.2)
CO2: 24 mmol/L (ref 20–29)
Calcium: 9.8 mg/dL (ref 8.6–10.2)
Chloride: 105 mmol/L (ref 96–106)
Creatinine, Ser: 1.43 mg/dL — ABNORMAL HIGH (ref 0.76–1.27)
Globulin, Total: 2.4 g/dL (ref 1.5–4.5)
Glucose: 84 mg/dL (ref 70–99)
Potassium: 4.5 mmol/L (ref 3.5–5.2)
Sodium: 142 mmol/L (ref 134–144)
Total Protein: 6.8 g/dL (ref 6.0–8.5)
eGFR: 52 mL/min/{1.73_m2} — ABNORMAL LOW (ref >59)

## 2023-04-18 LAB — LIPID PANEL
Chol/HDL Ratio: 2.6 {ratio} (ref 0.0–5.0)
Cholesterol, Total: 122 mg/dL (ref 100–199)
HDL: 47 mg/dL (ref >39)
LDL Chol Calc (NIH): 62 mg/dL (ref 0–99)
Triglycerides: 61 mg/dL (ref 0–149)
VLDL Cholesterol Cal: 13 mg/dL (ref 5–40)

## 2023-04-23 DIAGNOSIS — I25119 Atherosclerotic heart disease of native coronary artery with unspecified angina pectoris: Secondary | ICD-10-CM | POA: Diagnosis not present

## 2023-04-23 DIAGNOSIS — E785 Hyperlipidemia, unspecified: Secondary | ICD-10-CM | POA: Diagnosis not present

## 2023-04-23 DIAGNOSIS — E875 Hyperkalemia: Secondary | ICD-10-CM | POA: Diagnosis not present

## 2023-04-23 DIAGNOSIS — I10 Essential (primary) hypertension: Secondary | ICD-10-CM | POA: Diagnosis not present

## 2023-04-23 DIAGNOSIS — N1832 Chronic kidney disease, stage 3b: Secondary | ICD-10-CM | POA: Diagnosis not present

## 2023-04-23 DIAGNOSIS — I639 Cerebral infarction, unspecified: Secondary | ICD-10-CM | POA: Diagnosis not present

## 2023-04-23 DIAGNOSIS — N200 Calculus of kidney: Secondary | ICD-10-CM | POA: Diagnosis not present

## 2023-04-23 DIAGNOSIS — N400 Enlarged prostate without lower urinary tract symptoms: Secondary | ICD-10-CM | POA: Diagnosis not present

## 2023-04-23 DIAGNOSIS — M1000 Idiopathic gout, unspecified site: Secondary | ICD-10-CM | POA: Diagnosis not present

## 2023-04-29 ENCOUNTER — Other Ambulatory Visit: Payer: Self-pay | Admitting: Family Medicine

## 2023-04-29 DIAGNOSIS — K219 Gastro-esophageal reflux disease without esophagitis: Secondary | ICD-10-CM

## 2023-05-01 ENCOUNTER — Ambulatory Visit (HOSPITAL_BASED_OUTPATIENT_CLINIC_OR_DEPARTMENT_OTHER): Payer: Medicare PPO | Admitting: Cardiology

## 2023-05-03 ENCOUNTER — Ambulatory Visit (HOSPITAL_BASED_OUTPATIENT_CLINIC_OR_DEPARTMENT_OTHER): Payer: Medicare PPO | Admitting: Family

## 2023-05-03 ENCOUNTER — Other Ambulatory Visit: Payer: Medicare PPO

## 2023-05-03 ENCOUNTER — Encounter (HOSPITAL_BASED_OUTPATIENT_CLINIC_OR_DEPARTMENT_OTHER): Payer: Self-pay | Admitting: Family

## 2023-05-03 VITALS — BP 108/60 | HR 54 | Ht 66.0 in | Wt 125.6 lb

## 2023-05-03 DIAGNOSIS — I255 Ischemic cardiomyopathy: Secondary | ICD-10-CM | POA: Diagnosis not present

## 2023-05-03 DIAGNOSIS — D6859 Other primary thrombophilia: Secondary | ICD-10-CM

## 2023-05-03 DIAGNOSIS — N1831 Chronic kidney disease, stage 3a: Secondary | ICD-10-CM | POA: Diagnosis not present

## 2023-05-03 DIAGNOSIS — E785 Hyperlipidemia, unspecified: Secondary | ICD-10-CM | POA: Diagnosis not present

## 2023-05-03 DIAGNOSIS — I2510 Atherosclerotic heart disease of native coronary artery without angina pectoris: Secondary | ICD-10-CM | POA: Diagnosis not present

## 2023-05-03 DIAGNOSIS — I480 Paroxysmal atrial fibrillation: Secondary | ICD-10-CM | POA: Diagnosis not present

## 2023-05-03 NOTE — Patient Instructions (Addendum)
Medication Instructions:  Continue your current medications.   *If you need a refill on your cardiac medications before your next appointment, please call your pharmacy*  Follow-Up: At [facility], you and your health needs are our priority.  As part of our continuing mission to provide you with exceptional heart care, we have created designated Provider Care Teams.  These Care Teams include your primary Cardiologist (physician) and Advanced Practice Providers (APPs -  Physician Assistants and Nurse Practitioners) who all work together to provide you with the care you need, when you need it.  We recommend signing up for the patient portal called "MyChart".  Sign up information is provided on this After Visit Summary.  MyChart is used to connect with patients for Virtual Visits (Telemedicine).  Patients are able to view lab/test results, encounter notes, upcoming appointments, etc.  Non-urgent messages can be sent to your provider as well.   To learn more about what you can do with MyChart, go to [email or web address].    Your next appointment:   6 month(s)  Provider:   Jodelle Red, MD or Gillian Shields, NP    Other Instructions      Let us know if you would like a referral to the PREP program!

## 2023-05-03 NOTE — Progress Notes (Signed)
Cardiology Office Note:  .   Date:  05/03/2023  ID:  Andrew Townsend, DOB 22-Feb-1951, MRN [id number] PCP: Melida Quitter, PA  [facility] Providers Cardiologist:  Jodelle Red, MD    History of Present Illness: .   Andrew Townsend is a 72 y.o. male with history of hypertension, hyperlipidemia, CAD s/p MI with three-vessel CABG, ICM s/p ICD, COPD, CKDIII, neuropathy, BPH, PAF, PVC, aortic dilation, RAS s/p right renal artery stent, and gout.   Echo 08/2022 low normal LVEF 50 to 55%, no RWMA, mild LVH, indeterminate diastolic parameters, RV mildly enlarged, bilateral atria severely dilated, mild MR, mild dilation of aortic root 42 mm.  Seen by Dr. Cristal Deer 10/17/2022 euvolemic and doing overall well from a cardiac perspective and no changes were made.  He saw Dr. Ladona Ridgel for routine EP follow-up 12/11/2022 doing well and no changes made.  Presents today for follow-up independently. Exercising with housework. Exercise limtied by back pain, just had epidural recently which only lasted 1-2 weeks.  Discussed possible participation in prep exercise program which she will consider and contact us if interested.  Reports using Lasix approximately every other month with improvement in lower extremity edema.  No significant exertional dyspnea, chest pain, palpitations.  Denies bleeding complications on Eliquis.  Does note difficulty falling and staying asleep and does not feel Ambien is working as well as it used to.  He does have high caffeine intake with tea during the day and encouraged to trial stopping caffeine after 12 PM.   ROS: Please see the history of present illness.    All other systems reviewed and are negative.   Studies Reviewed: .        Cardiac Studies & Procedures      ECHOCARDIOGRAM  ECHOCARDIOGRAM COMPLETE 08/24/2022  Narrative ECHOCARDIOGRAM REPORT    Patient Name:   Andrew Townsend     Date of Exam: 08/24/2022 Medical Rec #:  [id number]     Height:       66.0 in Accession #:     [id number]    Weight:       124.0 lb Date of Birth:  [birth year]     BSA:          1.632 m Patient Age:    72 years      BP:           121/76 mmHg Patient Gender: M             HR:           51 bpm. Exam Location:  [address]  Procedure: 2D Echo, Color Doppler, Cardiac Doppler and 3D Echo  Indications:    Dyspnea R06.00  History:        Patient has no prior history of Echocardiogram examinations. CHF, CAD and Previous Myocardial Infarction, Prior CABG and Defibrillator, CKD; Risk Factors:Hypertension and Dyslipidemia.  Sonographer:    Thurman Coyer RDCS Referring Phys: [id number] RENEE LYNN URSUY  IMPRESSIONS   1. Left ventricular ejection fraction, by estimation, is 50 to 55%. Left ventricular ejection fraction by 3D volume is 52 %. The left ventricle has low normal function. The left ventricle has no regional wall motion abnormalities. There is mild left ventricular hypertrophy of the basal-septal segment. Left ventricular diastolic parameters are indeterminate. 2. Right ventricular systolic function is normal. The right ventricular size is mildly enlarged. 3. Left atrial size was severely dilated. 4. Right atrial size was severely dilated. 5. The mitral valve is normal in structure.  Mild mitral valve regurgitation. No evidence of mitral stenosis. 6. The aortic valve is normal in structure. Aortic valve regurgitation is not visualized. No aortic stenosis is present. 7. Aortic dilatation noted. There is mild dilatation of the aortic root, measuring 42 mm. 8. The inferior vena cava is normal in size with greater than 50% respiratory variability, suggesting right atrial pressure of 3 mmHg.  FINDINGS Left Ventricle: Left ventricular ejection fraction, by estimation, is 50 to 55%. Left ventricular ejection fraction by 3D volume is 52 %. The left ventricle has low normal function. The left ventricle has no regional wall motion abnormalities. The left ventricular internal cavity size  was normal in size. There is mild left ventricular hypertrophy of the basal-septal segment. Left ventricular diastolic parameters are indeterminate.  Right Ventricle: The right ventricular size is mildly enlarged. No increase in right ventricular wall thickness. Right ventricular systolic function is normal.  Left Atrium: Left atrial size was severely dilated.  Right Atrium: Right atrial size was severely dilated.  Pericardium: There is no evidence of pericardial effusion.  Mitral Valve: The mitral valve is normal in structure. Mild mitral valve regurgitation. No evidence of mitral valve stenosis.  Tricuspid Valve: The tricuspid valve is normal in structure. Tricuspid valve regurgitation is not demonstrated. No evidence of tricuspid stenosis.  Aortic Valve: The aortic valve is normal in structure. Aortic valve regurgitation is not visualized. No aortic stenosis is present.  Pulmonic Valve: The pulmonic valve was normal in structure. Pulmonic valve regurgitation is not visualized. No evidence of pulmonic stenosis.  Aorta: Aortic dilatation noted. There is mild dilatation of the aortic root, measuring 42 mm.  Venous: The inferior vena cava is normal in size with greater than 50% respiratory variability, suggesting right atrial pressure of 3 mmHg.  IAS/Shunts: No atrial level shunt detected by color flow Doppler.  Additional Comments: A device lead is visualized in the right ventricle.   LEFT VENTRICLE PLAX 2D LVIDd:         5.00 cm         Diastology LVIDs:         3.80 cm         LV e' medial:  6.29 cm/s LV PW:         1.00 cm         LV e' lateral: 10.10 cm/s LV IVS:        1.20 cm LVOT diam:     2.40 cm LV SV:         77              3D Volume EF LV SV Index:   47              LV 3D EF:    Left LVOT Area:     4.52 cm                     ventricul ar ejection fraction by 3D volume is 52 %.  3D Volume EF: 3D EF:        52 % LV EDV:       196 ml LV ESV:       94 ml LV  SV:        101 ml  RIGHT VENTRICLE RV Basal diam:  4.30 cm RV Mid diam:    3.90 cm RV S prime:     7.33 cm/s TAPSE (M-mode): 1.5 cm  LEFT ATRIUM  Index        RIGHT ATRIUM           Index LA diam:        4.90 cm  3.00 cm/m   RA Area:     16.50 cm LA Vol (A2C):   118.0 ml 72.30 ml/m  RA Volume:   42.60 ml  26.10 ml/m LA Vol (A4C):   98.1 ml  60.11 ml/m LA Biplane Vol: 108.0 ml 66.17 ml/m AORTIC VALVE LVOT Vmax:   66.00 cm/s LVOT Vmean:  46.700 cm/s LVOT VTI:    0.170 m  AORTA Ao Root diam: 4.20 cm   SHUNTS Systemic VTI:  0.17 m Systemic Diam: 2.40 cm  Donato Schultz MD Electronically signed by Donato Schultz MD Signature Date/Time: 08/27/2022/1:59:31 PM    Final   MONITORS  LONG TERM MONITOR (3-14 DAYS) 09/11/2022  Narrative NSR with sinus brady and sinus tachycardia with ave HR of 58/min Brief episodes of NS SVT and NS VT Occaisional PAC's and frequent PVC's. No prolonged pauses or atrial fib  Gregg Taylor,MD  Patch Wear Time:  3 days and 1 hours (2024-04-12T11:31:49-0400 to 2024-04-15T13:22:16-0400)  Patient had a min HR of 45 bpm, max HR of 162 bpm, and avg HR of 58 bpm. Predominant underlying rhythm was Sinus Rhythm. 11 Ventricular Tachycardia runs occurred, the run with the fastest interval lasting 4 beats with a max rate of 160 bpm, the longest lasting 4 beats with an avg rate of 105 bpm. 7 Supraventricular Tachycardia runs occurred, the run with the fastest interval lasting 12 beats with a max rate of 162 bpm (avg 145 bpm); the run with the fastest interval was also the longest. Isolated SVEs were occasional (2.2%, 5522), SVE Couplets were rare (<1.0%, 118), and SVE Triplets were rare (<1.0%, 7). Isolated VEs were frequent (16.9%, 42744), VE Couplets were frequent (8.9%, 11264), and VE Triplets were occasional (1.1%, 899). Ventricular Bigeminy and Trigeminy were present.           Risk Assessment/Calculations:    CHA2DS2-VASc Score = 3   This  indicates a 3.2% annual risk of stroke. The patient's score is based upon: CHF History: 1 HTN History: 0 Diabetes History: 0 Stroke History: 0 Vascular Disease History: 1 Age Score: 1 Gender Score: 0            Physical Exam:   VS:  BP 108/60   Pulse (!) 54 Comment: radial  Ht 5\' 6"  (1.676 m)   Wt 125 lb 9.6 oz (57 kg)   SpO2 97%   BMI 20.27 kg/m    Wt Readings from Last 3 Encounters:  05/03/23 125 lb 9.6 oz (57 kg)  04/10/23 120 lb (54.4 kg)  03/26/23 120 lb (54.4 kg)    GEN: Well nourished, well developed in no acute distress NECK: No JVD; No carotid bruits CARDIAC: RRR, no murmurs, rubs, gallops RESPIRATORY:  Clear to auscultation without rales, wheezing or rhonchi  ABDOMEN: Soft, non-tender, non-distended EXTREMITIES:  No edema; No deformity   ASSESSMENT AND PLAN: .    CAD / HLD, LDL goal <70 - Stable with no anginal symptoms. No indication for ischemic evaluation.  GDMT Metoprolol, Rosuvastatin. No ASA due to [facility]. Recommend aiming for 150 minutes of moderate intensity activity per week and following a heart healthy diet.  Offered referral to PREP exercise program, he will contact us if interested.  ICM / HFrEF with LVEF recovered to low normal - Euvolemic and well compensated on exam. Using PRN  Lasix sparingly with good result. Low sodium diet, fluid restriction <2L, and daily weights encouraged. Educated to contact our office for weight gain of 2 lbs overnight or 5 lbs in one week. GDMT jardiance, PRN Lasix, Metoprolol. BP will not tolerate Entresto.   Insomnia - Reports Ambien 5mg  not working as well. He we will discuss with PCP at upcoming follow-up.  Encouraged to reduce caffeine intake.  Also gave handout on sleep including recommendations such as having a bedtime routine, avoiding screens close to bedtime.  S/p ICD - Per EP team.   PAF / Hypercoagulable state / PVC / Bradycardia - RRR by auscultation. Asymptomatic regarding bradycardia. Reports no palpitations.  CHA2DS2-VASc Score = 3 [CHF History: 1, HTN History: 0, Diabetes History: 0, Stroke History: 0, Vascular Disease History: 1, Age Score: 1, Gender Score: 0].  Therefore, the patient's annual risk of stroke is 3.2 %.    Continue Eliquis 2.5mg  BID. Reduced dose due to renal function, body habitus. However, given fluctuating creatinine will require careful monitoring.   CKDIIIa - Careful titration of diuretic and antihypertensive.   Aortic root dilation - 42mm by echo 08/2022. Continue optimal BP control. Consider repeat echo at follow up for monitoring.        Dispo: follow up in 6 months with Dr. Cristal Deer  Signed, Alver Sorrow, NP

## 2023-05-09 ENCOUNTER — Encounter: Payer: Self-pay | Admitting: Family Medicine

## 2023-05-09 ENCOUNTER — Ambulatory Visit (INDEPENDENT_AMBULATORY_CARE_PROVIDER_SITE_OTHER): Payer: Medicare PPO | Admitting: Family Medicine

## 2023-05-09 VITALS — BP 134/72 | HR 36 | Resp 18 | Ht 66.0 in | Wt 123.0 lb

## 2023-05-09 DIAGNOSIS — K4021 Bilateral inguinal hernia, without obstruction or gangrene, recurrent: Secondary | ICD-10-CM | POA: Insufficient documentation

## 2023-05-09 DIAGNOSIS — N1832 Chronic kidney disease, stage 3b: Secondary | ICD-10-CM | POA: Diagnosis not present

## 2023-05-09 DIAGNOSIS — M1A39X0 Chronic gout due to renal impairment, multiple sites, without tophus (tophi): Secondary | ICD-10-CM

## 2023-05-09 DIAGNOSIS — Z1211 Encounter for screening for malignant neoplasm of colon: Secondary | ICD-10-CM

## 2023-05-09 DIAGNOSIS — M461 Sacroiliitis, not elsewhere classified: Secondary | ICD-10-CM

## 2023-05-09 DIAGNOSIS — Z1212 Encounter for screening for malignant neoplasm of rectum: Secondary | ICD-10-CM

## 2023-05-09 DIAGNOSIS — G4700 Insomnia, unspecified: Secondary | ICD-10-CM | POA: Diagnosis not present

## 2023-05-09 DIAGNOSIS — I10 Essential (primary) hypertension: Secondary | ICD-10-CM

## 2023-05-09 DIAGNOSIS — E785 Hyperlipidemia, unspecified: Secondary | ICD-10-CM | POA: Diagnosis not present

## 2023-05-09 DIAGNOSIS — J449 Chronic obstructive pulmonary disease, unspecified: Secondary | ICD-10-CM

## 2023-05-09 DIAGNOSIS — K219 Gastro-esophageal reflux disease without esophagitis: Secondary | ICD-10-CM

## 2023-05-09 MED ORDER — EZETIMIBE 10 MG PO TABS
10.0000 mg | ORAL_TABLET | Freq: Every day | ORAL | 1 refills | Status: DC
Start: 2023-05-09 — End: 2023-12-24

## 2023-05-09 MED ORDER — ROSUVASTATIN CALCIUM 40 MG PO TABS: 40.0000 mg | ORAL_TABLET | Freq: Every day | ORAL | 1 refills | Status: DC

## 2023-05-09 MED ORDER — METOPROLOL SUCCINATE ER 100 MG PO TB24: 100.0000 mg | ORAL_TABLET | Freq: Every day | ORAL | 1 refills | Status: DC

## 2023-05-09 MED ORDER — RAMELTEON 8 MG PO TABS: 8.0000 mg | ORAL_TABLET | Freq: Every day | ORAL | 2 refills | Status: DC

## 2023-05-09 MED ORDER — ALLOPURINOL 100 MG PO TABS
100.0000 mg | ORAL_TABLET | Freq: Every day | ORAL | 1 refills | Status: DC
Start: 2023-05-09 — End: 2023-12-24

## 2023-05-09 MED ORDER — ALBUTEROL SULFATE HFA 108 (90 BASE) MCG/ACT IN AERS: 2.0000 | INHALATION_SPRAY | Freq: Four times a day (QID) | RESPIRATORY_TRACT | 1 refills | Status: DC | PRN

## 2023-05-09 MED ORDER — OMEPRAZOLE 20 MG PO CPDR: 20.0000 mg | DELAYED_RELEASE_CAPSULE | Freq: Every day | ORAL | 1 refills | Status: DC

## 2023-05-09 NOTE — Assessment & Plan Note (Signed)
Continue routine follow-up with pain management to discuss alternative methods or changing medication regimen.

## 2023-05-09 NOTE — Assessment & Plan Note (Signed)
Given that right hernia is not fully reducible and occasionally causes pain, recommend referral to general surgery for further evaluation.  Patient verbalized understanding and is agreeable to this plan.

## 2023-05-09 NOTE — Assessment & Plan Note (Signed)
Controlled.  Continue allopurinol 100 mg daily for maintenance.

## 2023-05-09 NOTE — Addendum Note (Signed)
Addended by: Saralyn Pilar on: 05/09/2023 12:07 PM   Modules accepted: Orders

## 2023-05-09 NOTE — Assessment & Plan Note (Addendum)
Fairly stable.  For now, continue Trelegy inhaler as maintenance with albuterol inhaler for rescue.  Sent refill of albuterol rescue inhaler, encouraged him to keep inhalers in several locations so that he always has access to them including when he is away from home.  If needed, intensifying maintenance therapy to triple therapy inhaler would be reasonable next step.

## 2023-05-09 NOTE — Progress Notes (Signed)
Established Patient Office Visit  Subjective   Patient ID: Andrew Townsend, male    DOB: 06/12/1950  Age: 72 y.o. MRN: [id number]  Chief Complaint  Patient presents with   Hyperlipidemia   Hypertension   Gastroesophageal Reflux   COPD   Gout    HPI Andrew Townsend is a 72 y.o. male presenting today for follow up of hypertension, hyperlipidemia, COPD, gout, GERD, CKD.  Chronic conditions continue to be well-controlled other than COPD.  He has to use his rescue albuterol inhaler 1-2 times each week.  He is mostly concerned because he had a flare last week and was not able to use a rescue inhaler until he got home which was a very scary experience for him.  He is still using Trelegy 1 puff daily for maintenance and has an albuterol inhaler for rescue.  He also reports that "Ambien is not working as well" for insomnia.  He continues to struggle with sleep induction, often laying awake for several hours before falling asleep which is also making him tired the next day.  He notes that it is important to him that any medication does not make it difficult for him to wake up in the middle of the night to use the bathroom or cause next-day somnolence.  He has concerns about bilateral hernias as well as 7/10 back pain.  He has a history of bilateral inguinal hernia repair approximately 15 years ago in [place].  Within the past few years, he has noticed that they are both bulging out R>L.  He also notes that when he sits in a chair and bends forward, the right hernia is painful.  He continues to have back pain, he is established with pain management prescribing Percocet, last appointment 03/26/2023 with upcoming appointment scheduled for 06/25/2023.  Outpatient Medications Prior to Visit  Medication Sig   albuterol (PROVENTIL) (2.5 MG/3ML) 0.083% nebulizer solution INHALE THE CONTENTS OF 1 VIAL VIA NEBULIZER EVERY 6 HOURS AS NEEDED FOR WHEEZING OR SHORTNESS OF BREATH.   allopurinol (ZYLOPRIM) 100 MG tablet Take 1  tablet (100 mg total) by mouth daily.   apixaban (ELIQUIS) 2.5 MG TABS tablet TAKE 1 TABLET BY MOUTH 2 TIMES DAILY.   buprenorphine (BUTRANS) 20 MCG/HR PTWK Place 1 patch onto the skin once a week.   empagliflozin (JARDIANCE) 10 MG TABS tablet Take 10 mg by mouth daily.   ezetimibe (ZETIA) 10 MG tablet Take 1 tablet (10 mg total) by mouth daily.   Fluticasone-Umeclidin-Vilant (TRELEGY ELLIPTA) 100-62.5-25 MCG/ACT AEPB Inhale 1 puff into the lungs daily.   furosemide (LASIX) 40 MG tablet Take 1 tablet (40 mg total) by mouth daily as needed for fluid.   gabapentin (NEURONTIN) 300 MG capsule Take 3 capsules (900 mg total) by mouth at bedtime.   Magnesium Oxide (DIASENSE MAGNESIUM PO) Take 800 mg by mouth daily at 6 (six) AM.   Multiple Vitamin (MULTIVITAMIN) tablet Take 1 tablet by mouth daily.   nitroGLYCERIN (NITROSTAT) 0.4 MG SL tablet Place 1 tablet (0.4 mg total) under the tongue every 5 (five) minutes as needed for chest pain. If second dose is needed to relieve chest pain, seek immediate emergency medical attention.   omeprazole (PRILOSEC) 40 MG capsule TAKE 1 CAPSULE (40 MG TOTAL) BY MOUTH DAILY. NEED OFFICE VISIT FOR REFILLS   oxybutynin (DITROPAN-XL) 10 MG 24 hr tablet TAKE 1 TABLET (10 MG TOTAL) BY MOUTH DAILY.   oxyCODONE-acetaminophen (PERCOCET) 10-325 MG tablet Take 1 tablet by mouth every 8 (eight) hours  as needed for pain.   [START ON 05/25/2023] oxyCODONE-acetaminophen (PERCOCET) 10-325 MG tablet Take 1 tablet by mouth every 8 (eight) hours as needed for pain.   potassium chloride (KLOR-CON M) 10 MEQ tablet Take 10 mEq by mouth daily.   rosuvastatin (CRESTOR) 40 MG tablet Take 1 tablet (40 mg total) by mouth at bedtime.   tiZANidine (ZANAFLEX) 4 MG tablet Take 1 tablet (4 mg total) by mouth every 8 (eight) hours as needed.   [DISCONTINUED] albuterol (VENTOLIN HFA) 108 (90 Base) MCG/ACT inhaler Inhale 2 puffs into the lungs every 6 (six) hours as needed for wheezing or shortness of  breath.   [DISCONTINUED] zolpidem (AMBIEN) 5 MG tablet TAKE 1 TABLET (5 MG TOTAL) BY MOUTH AT BEDTIME AS NEEDED FOR SLEEP.   metoprolol succinate (TOPROL-XL) 100 MG 24 hr tablet Take 1.5 tablets (150 mg total) by mouth daily.   No facility-administered medications prior to visit.    ROS Negative unless otherwise noted in HPI   Objective:     BP 134/72 (BP Location: Left Arm, Patient Position: Sitting, Cuff Size: Normal)   Pulse (!) 36   Resp 18   Ht 5\' 6"  (1.676 m)   Wt 123 lb (55.8 kg)   SpO2 99%   BMI 19.85 kg/m   Physical Exam Constitutional:      General: He is not in acute distress.    Appearance: Normal appearance.  HENT:     Head: Normocephalic and atraumatic.  Cardiovascular:     Rate and Rhythm: Normal rate and regular rhythm.     Heart sounds: Normal heart sounds. No murmur heard.    No friction rub. No gallop.  Pulmonary:     Effort: Pulmonary effort is normal. No respiratory distress.     Breath sounds: Normal breath sounds. No wheezing, rhonchi or rales.  Abdominal:     Hernia: A hernia is present. Hernia is present in the umbilical area, left inguinal area (reducible, nontender) and right inguinal area (partially reducible, nontender).  Skin:    General: Skin is warm and dry.  Neurological:     Mental Status: He is alert and oriented to person, place, and time.  Psychiatric:        Mood and Affect: Mood normal.     Assessment & Plan:  Primary hypertension Assessment & Plan: Stable.  Continue metoprolol 150 mg daily.  CMP within normal limits with the exception of creatinine and GFR consistent with CKD.  Will continue to monitor.   Hyperlipidemia, unspecified hyperlipidemia type Assessment & Plan: Last lipid panel: LDL 62, HDL 47, triglycerides 61.  Hepatic function within normal limits.  Continue Zetia 10 mg daily and rosuvastatin 40 mg daily.  Will continue to monitor.   Chronic gout due to renal impairment of multiple sites without  tophus Assessment & Plan: Controlled.  Continue allopurinol 100 mg daily for maintenance.   Gastroesophageal reflux disease, unspecified whether esophagitis present Assessment & Plan: Symptoms currently stable.  Recommend trial of omeprazole 20 mg daily.   Stage 3b chronic kidney disease (HCC) Assessment & Plan: Followed by nephrology.  Creatinine 1.43 and GFR 52, both stable.  Will continue to monitor and coordination with nephrology.   Chronic obstructive pulmonary disease, unspecified COPD type (HCC) Assessment & Plan: Fairly stable.  For now, continue Trelegy inhaler as maintenance with albuterol inhaler for rescue.  Sent refill of albuterol rescue inhaler, encouraged him to keep inhalers in several locations so that he always has access to them including  when he is away from home.  If needed, intensifying maintenance therapy to triple therapy inhaler would be reasonable next step.  Orders: -     Albuterol Sulfate HFA; Inhale 2 puffs into the lungs every 6 (six) hours as needed for wheezing or shortness of breath.  Dispense: 24 g; Refill: 1  Insomnia, unspecified type Assessment & Plan: Discussed limitations of dosing for medications like Ambien due to age.  Sleep difficulties are impacting his ability to go about day-to-day activities, so he would like to try a different medication.  Age also limits viable options for sleep induction medication, and need to avoid next-day somnolence and inability to wake up to void in the middle of the night eliminates possibility of using medication like suvorexant.  Most appropriate choice is to initiate ramelteon 8 mg nightly at bedtime for sleep induction.  If this proves ineffective, may need to try different therapy or encourage CBT-I.  Orders: -     Ramelteon; Take 1 tablet (8 mg total) by mouth at bedtime.  Dispense: 30 tablet; Refill: 2  Bilateral recurrent inguinal hernia without obstruction or gangrene Assessment & Plan: Given that  right hernia is not fully reducible and occasionally causes pain, recommend referral to general surgery for further evaluation.  Patient verbalized understanding and is agreeable to this plan.  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   Screening for colorectal cancer -     Ambulatory referral to Gastroenterology    Return in about 3 months (around 08/07/2023) for follow-up for COPD, insomnia.   I spent 45 minutes on the day of the encounter to include pre-visit record review, face-to-face time with the patient obtaining history and performing physical exam, and post visit ordering of medications and referrals.  Melida Quitter, PA

## 2023-05-09 NOTE — Assessment & Plan Note (Signed)
Symptoms currently stable.  Recommend trial of omeprazole 20 mg daily.

## 2023-05-09 NOTE — Assessment & Plan Note (Signed)
Stable.  Continue metoprolol 150 mg daily.  CMP within normal limits with the exception of creatinine and GFR consistent with CKD.  Will continue to monitor.

## 2023-05-09 NOTE — Assessment & Plan Note (Signed)
Followed by nephrology.  Creatinine 1.43 and GFR 52, both stable.  Will continue to monitor and coordination with nephrology.

## 2023-05-09 NOTE — Progress Notes (Signed)
PCP contacted cardiology to ensure that change in metoprolol dosing would be appropriate.  From: Alver Sorrow, NP Sent: 05/09/2023  11:53 AM EST To: Melida Quitter, PA  Hi Lequita Halt,   I think reducing from 150mg  to 100mg  would be reasonable! I just anticipate if his HR is in the 30's persistently it is going to eventually cause symptoms. I would just encourage him to contact the office if he experiences recurrent palpitations or heart rate while resting of >100bpm.   Caitlin ----- Message ----- From: Melida Quitter, PA Sent: 05/09/2023  11:29 AM EST To: Alver Sorrow, NP  Hi Caitlin! I saw our mutual patient this morning and wanted to touch base with you regarding his low heart rate.  It was 36 in our office today, and reviewing his vital signs over the past year or so it looks like more often than not has been running under 60.  I was wondering if it would be appropriate from a cardiac standpoint to decrease his metoprolol dose from 150 mg daily to 100 mg daily, or if there is no concern for his low heart rate.  Let me know, I appreciate your expertise in his care!  Saralyn Pilar PA-C

## 2023-05-09 NOTE — Assessment & Plan Note (Addendum)
Discussed limitations of dosing for medications like Ambien due to age.  Sleep difficulties are impacting his ability to go about day-to-day activities, so he would like to try a different medication.  Age also limits viable options for sleep induction medication, and need to avoid next-day somnolence and inability to wake up to void in the middle of the night eliminates possibility of using medication like suvorexant.  Most appropriate choice is to initiate ramelteon 8 mg nightly at bedtime for sleep induction.  If this proves ineffective, may need to try different therapy or encourage CBT-I.

## 2023-05-09 NOTE — Assessment & Plan Note (Signed)
Last lipid panel: LDL 62, HDL 47, triglycerides 61.  Hepatic function within normal limits.  Continue Zetia 10 mg daily and rosuvastatin 40 mg daily.  Will continue to monitor.

## 2023-05-19 ENCOUNTER — Encounter: Payer: Self-pay | Admitting: Family Medicine

## 2023-05-19 DIAGNOSIS — G4700 Insomnia, unspecified: Secondary | ICD-10-CM

## 2023-05-21 ENCOUNTER — Other Ambulatory Visit: Payer: Self-pay | Admitting: Cardiology

## 2023-05-21 DIAGNOSIS — I480 Paroxysmal atrial fibrillation: Secondary | ICD-10-CM

## 2023-05-21 MED ORDER — RAMELTEON 8 MG PO TABS: 8.0000 mg | ORAL_TABLET | Freq: Every day | ORAL | 2 refills | Status: DC

## 2023-05-21 NOTE — Telephone Encounter (Signed)
 Eliquis  2.5mg  refill request received. Patient is 72 years old, weight-55.8kg, Crea- 1.43 on 04/17/23, Diagnosis-Afib, and last seen by [name] Finder on 05/03/23. Will send in refill to requested pharmacy.    Per last OV note from Caitlyn on 05/03/23 it states:  Continue Eliquis  2.5mg  BID. Reduced dose due to renal function, body habitus. However, given fluctuating creatinine will require careful monitoring.

## 2023-05-21 NOTE — Addendum Note (Signed)
 Addended by: Saralyn Pilar on: 05/21/2023 10:35 AM   Modules accepted: Orders

## 2023-05-29 ENCOUNTER — Telehealth: Payer: Self-pay

## 2023-05-29 ENCOUNTER — Ambulatory Visit: Payer: Self-pay | Admitting: General Surgery

## 2023-05-29 DIAGNOSIS — K4021 Bilateral inguinal hernia, without obstruction or gangrene, recurrent: Secondary | ICD-10-CM | POA: Diagnosis not present

## 2023-05-29 NOTE — Telephone Encounter (Signed)
   Pre-operative Risk Assessment    Patient Name: Andrew Townsend  DOB: 1950-12-29 MRN: [id number]   Date of last office visit: 05/03/23 [name] RAMAN. Vannie, NP Date of next office visit: None   Request for Surgical Clearance    Procedure:  Hernia Surgery  Date of Surgery:  Clearance TBD                                Surgeon: Dr. Lynda Leos Surgeon's Group or Practice Name:  [facility] Phone number:  [phone] Fax number:  [phone]   Type of Clearance Requested:   - Medical  - Pharmacy:  Hold Apixaban  (Eliquis )     Type of Anesthesia:  General    Additional requests/questions:    [name] [name]   05/29/2023, 3:41 PM

## 2023-05-29 NOTE — Telephone Encounter (Signed)
 Helping in preop today. Patient was seen <1 month ago by [name] Finder, note reviewed, activity limited by back pain but otherwise clinically stable. Will route to Caitlin to see if she can provide input on surgical clearance as requested. Caitlin - Please route response to P CV DIV PREOP (the pre-op pool). Thank you.  Will also route msg to pharm for anticoag.

## 2023-05-29 NOTE — H&P (Signed)
 History of Present Illness: Andrew Townsend is a 73 y.o. male who is seen today as an office consultation at the request of Dr. Wallace for evaluation of New Consultation ( BILAT RECURRENT INGUINAL HERNIA ) .   Patient is a 73 year old male who comes in secondary to recurrent inguinal hernias. Patient has a history of CAD, COPD, chronic pain medication.  Patient states that he has noticed that the hernias have recurred, right greater than left.  He states that he had more pain to the right.  He states there getting bigger.  He is unsure whether or not the last hernias repaired 15 years ago or laparoscopic or open.  Patient does state he follows up with cardiology for his CAD status post CABG and pulmonary for his COPD.  Patient also chronic pain medication for back pain.     Review of Systems: A complete review of systems was obtained from the patient.  I have reviewed this information and discussed as appropriate with the patient.  See HPI as well for other ROS.  Review of Systems  Constitutional:  Negative for fever.  HENT:  Negative for congestion.   Eyes:  Negative for blurred vision.  Respiratory:  Negative for cough, shortness of breath and wheezing.   Cardiovascular:  Negative for chest pain and palpitations.  Gastrointestinal:  Negative for heartburn.  Genitourinary:  Negative for dysuria.  Musculoskeletal:  Negative for myalgias.  Skin:  Negative for rash.  Neurological:  Negative for dizziness and headaches.  Psychiatric/Behavioral:  Negative for depression and suicidal ideas.   All other systems reviewed and are negative.     Medical History: Past Medical History: Diagnosis Date  Arthritis   CHF (congestive heart failure) (CMS/HHS-HCC)   COPD (chronic obstructive pulmonary disease) (CMS/HHS-HCC)   GERD (gastroesophageal reflux disease)   History of stroke   Hyperlipidemia   Hypertension    There is no problem list on file for this patient.   Past Surgical  History: Procedure Laterality Date  ENDOSCOPIC HARVEST RADIAL ARTERY FOR CABG    HERNIA REPAIR    JOINT REPLACEMENT    Right Kidney Stent      No Known Allergies  Current Outpatient Medications on File Prior to Visit Medication Sig Dispense Refill  albuterol  (PROVENTIL ) 2.5 mg /3 mL (0.083 %) nebulizer solution INHALE THE CONTENTS OF 1 VIAL VIA NEBULIZER EVERY 6 HOURS AS NEEDED FOR WHEEZING OR SHORTNESS OF BREATH.    allopurinoL  (ZYLOPRIM ) 100 MG tablet Take 100 mg by mouth once daily    buprenorphine  (BUTRANS ) 15 mcg/hour Place 1 patch onto the skin every 7 (seven) days    ELIQUIS  2.5 mg tablet Take 1 tablet by mouth 2 (two) times daily    empagliflozin  (JARDIANCE ) 10 mg tablet Take 10 mg by mouth once daily    ezetimibe  (ZETIA ) 10 mg tablet Take 1 tablet by mouth once daily    FUROsemide  (LASIX ) 40 MG tablet Take 40 mg by mouth    gabapentin  (NEURONTIN ) 300 MG capsule Take 900 mg by mouth at bedtime    metoprolol  SUCCinate (TOPROL -XL) 100 MG XL tablet Take 100 mg by mouth    multivitamin tablet Take 1 tablet by mouth once daily    nitroGLYcerin  (NITROSTAT ) 0.4 MG SL tablet Place 0.4 mg under the tongue    oxyBUTYnin  (DITROPAN -XL) 10 MG XL tablet Take 1 tablet by mouth once daily    oxyCODONE -acetaminophen  (PERCOCET) 10-325 mg tablet TAKE 1 TABLET BY MOUTH EVERY 8 HOURS AS NEEDED FOR PAIN.  EFFECTIVE 05/25/2023    potassium chloride  (KLOR-CON  M10) 10 mEq ER tablet Take 10 mEq by mouth once daily    ramelteon  (ROZEREM ) 8 mg tablet Take 8 mg by mouth    rosuvastatin  (CRESTOR ) 40 MG tablet Take 40 mg by mouth at bedtime    tiZANidine  (ZANAFLEX ) 4 MG capsule Take 4 mg by mouth    TRELEGY ELLIPTA  100-62.5-25 mcg inhaler Inhale 1 Puff into the lungs once daily    No current facility-administered medications on file prior to visit.   History reviewed. No pertinent family history.   Social History  Tobacco Use Smoking Status Former  Types: Cigarettes Smokeless Tobacco Not on file     Social History  Socioeconomic History  Marital status: Widowed Tobacco Use  Smoking status: Former   Types: Cigarettes Vaping Use  Vaping status: Some Days Substance and Sexual Activity  Alcohol use: Not Currently  Drug use: Never  Social Drivers of Health  Financial Resource Strain: Medium Risk (07/09/2022)  Received from [facility]  Overall Financial Resource Strain (CARDIA)   Difficulty of Paying Living Expenses: Somewhat hard Food Insecurity: No Food Insecurity (07/09/2022)  Received from [facility]  Hunger Vital Sign   Worried About Running Out of Food in the Last Year: Never true   Ran Out of Food in the Last Year: Never true Transportation Needs: No Transportation Needs (07/09/2022)  [facility] - Transportation   Lack of Transportation (Medical): No   Lack of Transportation (Non-Medical): No Physical Activity: Sufficiently Active (07/09/2022)  Received from [facility]  Exercise Vital Sign   Days of Exercise per Week: 7 days   Minutes of Exercise per Session: 40 min Stress: Stress Concern Present (07/09/2022)  Received from [facility] - Occupational Stress Questionnaire   Feeling of Stress : To some extent Social Connections: Moderately Isolated (07/09/2022)  Received from [facility]  Social Connection and Isolation Panel [NHANES]   Frequency of Communication with Friends and Family: More than three times a week   Frequency of Social Gatherings with Friends and Family: More than three times a week   Attends Religious Services: 1 to 4 times per year   Active Member of [employer] or Organizations: No   Attends Banker Meetings: Never   Marital Status: Widowed Housing Stability: Unknown (05/29/2023)  Housing Stability Vital Sign   Homeless in the Last Year: No   Objective:   Vitals:  05/29/23 1357 BP: (!) 157/77 Pulse: 51 Temp: 36.4 C (97.5 F) SpO2: 99% Weight: 55.7 kg (122 lb  12.8 oz) Height: 167.6 cm (5' 6) PainSc: 0-No pain   Body mass index is 19.82 kg/m. Physical Exam Constitutional:      Appearance: Normal appearance.  HENT:     Head: Normocephalic and atraumatic.     Nose: Nose normal. No congestion.     Mouth/Throat:     Mouth: Mucous membranes are moist.     Pharynx: Oropharynx is clear.  Eyes:     Pupils: Pupils are equal, round, and reactive to light.  Cardiovascular:     Rate and Rhythm: Normal rate and regular rhythm.     Pulses: Normal pulses.     Heart sounds: Normal heart sounds. No murmur heard.    No friction rub. No gallop.  Pulmonary:     Effort: Pulmonary effort is normal. No respiratory distress.     Breath sounds: Normal breath sounds. No stridor. No wheezing, rhonchi  or rales.  Abdominal:     General: Abdomen is flat.     Hernia: A hernia is present. Hernia is present in the left inguinal area and right inguinal area.  Musculoskeletal:        General: Normal range of motion.     Cervical back: Normal range of motion.  Skin:    General: Skin is warm and dry.  Neurological:     General: No focal deficit present.     Mental Status: He is alert and oriented to person, place, and time.  Psychiatric:        Mood and Affect: Mood normal.        Thought Content: Thought content normal.       Assessment and Plan: Diagnoses and all orders for this visit:  Bilateral recurrent inguinal hernia without obstruction or gangrene    Andrew Townsend is a 73 y.o. male   1.  We will proceed to the OR for a open bilateral inguinal hernia repair with mesh. 2. All risks and benefits were discussed with the patient, to generally include infection, bleeding, damage to surrounding structures, acute and chronic nerve pain, and recurrence. Alternatives were offered and described.  All questions were answered and the patient voiced understanding of the procedure and wishes to proceed at this point.       No follow-ups on file.  Lynda Leos, MD, [facility], [place] [facility]

## 2023-05-30 ENCOUNTER — Encounter: Payer: Self-pay | Admitting: Internal Medicine

## 2023-05-30 NOTE — Telephone Encounter (Signed)
 Despite limitation of orthopedic pain in clinic he was able to achieve >4METS without anginal symptoms. Per AHA/ACC guidelines, he is deemed acceptable risk for the planned procedure without additional cardiovascular testing.   Alver Sorrow, NP

## 2023-05-30 NOTE — Progress Notes (Signed)
 PERIOPERATIVE PRESCRIPTION FOR IMPLANTED CARDIAC DEVICE PROGRAMMING  Patient Information: Name:  Andrew Townsend  DOB:  20-Jul-1950  MRN:  [id number]  Procedure:  Hernia Surgery  Date of Surgery:  Clearance TBD                    Surgeon: Dr. Lynda Leos Surgeon's Group or Practice Name:  [facility] Phone number:  [phone] Fax number:  [phone]   Type of Clearance Requested:   - Medical  - Pharmacy:  Hold Apixaban  (Eliquis )     Type of Anesthesia:  General  Device Information:  Clinic EP Physician:  Danelle Birmingham, MD   Device Type:  Defibrillator Manufacturer and Phone #:  [employer]: [id number] Pacemaker Dependent?:  No. Date of Last Device Check:  03/13/2023 Normal Device Function?:  Yes.    Electrophysiologist's Recommendations:  Have magnet available. Provide continuous ECG monitoring when magnet is used or reprogramming is to be performed.  Procedure may interfere with device function.  Magnet should be placed over device during procedure.  Per Device Clinic Standing Orders, Delon DELENA Sharps, RN  12:07 PM 05/30/2023

## 2023-05-30 NOTE — Telephone Encounter (Signed)
 Andrew Townsend (Key: [id number]) PA Case ID #: [id number] Need Help? Call us  at [phone] Outcome Additional Information Required There is an existing case within the [facility] environment that has the same patient, prescriber, and drug. This case must be finalized before proceeding with similar requests. Drug Ramelteon  8MG  tablets ePA cloud Engineer[employer] Electronic PA Form

## 2023-05-31 NOTE — Telephone Encounter (Signed)
 Patient with diagnosis of afib on Eliquis  for anticoagulation.    Procedure: hernia surgery Date of procedure: TBD   CHA2DS2-VASc Score = 6   This indicates a 9.7% annual risk of stroke. The patient's score is based upon: CHF History: 1 HTN History: 1 Diabetes History: 0 Stroke History: 2 Vascular Disease History: 1 Age Score: 1 Gender Score: 0      CrCl 42 ml/min Platelet count 179  Per office protocol, patient can hold Eliquis  for 2 days prior to procedure.    **This guidance is not considered finalized until pre-operative APP has relayed final recommendations.**

## 2023-06-03 NOTE — Telephone Encounter (Signed)
   Patient Name: Andrew Townsend  DOB: [birth year] MRN: [id number]  Primary Cardiologist: Shelda Bruckner, MD  Chart reviewed as part of pre-operative protocol coverage. Given past medical history and time since last visit, based on ACC/AHA guidelines, Elder Davidian is at acceptable risk for the planned procedure without further cardiovascular testing.   Patient with diagnosis of afib on Eliquis  for anticoagulation.     Procedure: hernia surgery Date of procedure: TBD     CHA2DS2-VASc Score = 6   This indicates a 9.7% annual risk of stroke. The patient's score is based upon: CHF History: 1 HTN History: 1 Diabetes History: 0 Stroke History: 2 Vascular Disease History: 1 Age Score: 1 Gender Score: 0      CrCl 42 ml/min Platelet count 179   Per office protocol, patient can hold Eliquis  for 2 days prior to procedure.     **This guidance is not considered finalized until pre-operative APP has relayed final recommendations.**   The patient was advised that if he develops new symptoms prior to surgery to contact our office to arrange for a follow-up visit, and he verbalized understanding.  I will route this recommendation to the requesting party via Epic fax function and remove from pre-op pool.  Please call with questions.  Lamarr Satterfield, NP 06/03/2023, 8:56 AM

## 2023-06-05 ENCOUNTER — Ambulatory Visit (INDEPENDENT_AMBULATORY_CARE_PROVIDER_SITE_OTHER): Payer: Medicare PPO | Admitting: Family Medicine

## 2023-06-05 VITALS — BP 134/87 | HR 47 | Ht 66.0 in | Wt 127.8 lb

## 2023-06-05 DIAGNOSIS — R0781 Pleurodynia: Secondary | ICD-10-CM

## 2023-06-05 NOTE — Progress Notes (Signed)
   Acute Office Visit  Subjective:     Patient ID: Andrew Townsend, male    DOB: 13-Jun-1950, 73 y.o.   MRN: [id number]  Chief Complaint  Patient presents with   Chest Pain    Ribs rt side    HPI Patient is in today for right rib pain.  He reports that he was emptying a small trash can into his larger bin and accidentally dropped a small can in.  When he leaned over to get the can out of the bed, he put a lot of pressure on his right ribs.  Since then, he has been experiencing right rib pain.  It hurts with particular movements like twisting as well as deep breathing.  He has used ice a minimal amount which did provide some relief.  ROS See HPI    Objective:    BP 134/87   Pulse (!) 47   Ht 5\' 6"  (1.676 m)   Wt 127 lb 12 oz (57.9 kg)   SpO2 99%   BMI 20.62 kg/m   Physical Exam Constitutional:      General: He is not in acute distress.    Appearance: Normal appearance.  HENT:     Head: Normocephalic and atraumatic.  Cardiovascular:     Rate and Rhythm: Regular rhythm.     Heart sounds: Normal heart sounds. No murmur heard.    No friction rub. No gallop.  Pulmonary:     Effort: Pulmonary effort is normal. No respiratory distress.     Breath sounds: Normal breath sounds. No wheezing, rhonchi or rales.  Chest:     Chest wall: Tenderness (TTP over lateral lower right ribs) present.    Skin:    General: Skin is warm and dry.  Neurological:     Mental Status: He is alert and oriented to person, place, and time.  Psychiatric:        Mood and Affect: Mood normal.       Assessment & Plan:  Rib pain on right side -     DG Chest 2 View  No decreased breath sounds but will order CXR to rule out pneumothorax and hemothorax.  Recommend Tylenol  650 mg every 4 hours and ice.  Encouraged to avoid aggravating movements and to do deep breathing exercises several times each day to prevent pneumonia.  Return if symptoms worsen or fail to improve.  Noreene Bearded, PA

## 2023-06-05 NOTE — Patient Instructions (Addendum)
 Xrays will be at [facility]: [phone]  Rib Injuries Managing pain, stiffness, and swelling If directed, put ice on the injured area. To do this: Put ice in a plastic bag. Place a towel between your skin and the bag. Leave the ice on for 20 minutes, 2-3 times a day. Remove the ice if your skin turns bright red. This is very important. If you cannot feel pain, heat, or cold, you have a greater risk of damage to the area. Take over-the-counter and prescription medicines only as told by your health care provider. Take acetaminophen  650 mg every 4 hours next 7 days.  After that, spaced out to acetaminophen  650 mg every 6 hours as tolerated. Activity Avoid doing activities or movements that cause pain. Be careful during activities and avoid bumping the injured rib. Slowly increase your activity as told by your health care provider. General instructions Do deep breathing exercises as told by your health care provider. This helps prevent pneumonia, which is a common complication of a broken rib. Your health care provider may instruct you to: Take deep breaths several times a day. Try to cough several times a day, holding a pillow against the injured area. Use a device called incentive spirometer to practice deep breathing several times a day. Drink enough fluid to keep your urine pale yellow. Do not wear a rib belt or binder. These restrict breathing, which can lead to pneumonia. Keep all follow-up visits. This is important.

## 2023-06-06 ENCOUNTER — Ambulatory Visit
Admission: RE | Admit: 2023-06-06 | Discharge: 2023-06-06 | Disposition: A | Discharge status: Home / Self Care | Payer: Medicare PPO | Source: Ambulatory Visit | Attending: Family Medicine | Admitting: Family Medicine

## 2023-06-06 DIAGNOSIS — J439 Emphysema, unspecified: Secondary | ICD-10-CM | POA: Diagnosis not present

## 2023-06-06 DIAGNOSIS — R0781 Pleurodynia: Secondary | ICD-10-CM | POA: Diagnosis not present

## 2023-06-11 ENCOUNTER — Ambulatory Visit: Payer: Medicare PPO

## 2023-06-11 DIAGNOSIS — I5022 Chronic systolic (congestive) heart failure: Secondary | ICD-10-CM

## 2023-06-11 DIAGNOSIS — I255 Ischemic cardiomyopathy: Secondary | ICD-10-CM | POA: Diagnosis not present

## 2023-06-11 LAB — CUP PACEART REMOTE DEVICE CHECK
Battery Remaining Longevity: 40 mo
Battery Voltage: 2.97 V
Brady Statistic RV Percent Paced: 10.57 %
Date Time Interrogation Session: 20250121012504
HighPow Impedance: 64 Ohm
Implantable Lead Connection Status: 753985
Implantable Lead Implant Date: 20180209
Implantable Lead Location: 753860
Implantable Lead Model: 6935
Implantable Pulse Generator Implant Date: 20180209
Lead Channel Impedance Value: 247 Ohm
Lead Channel Impedance Value: 342 Ohm
Lead Channel Sensing Intrinsic Amplitude: 3 mV
Lead Channel Sensing Intrinsic Amplitude: 3 mV
Lead Channel Setting Pacing Amplitude: 2 V
Lead Channel Setting Pacing Pulse Width: 0.5 ms
Lead Channel Setting Sensing Sensitivity: 0.3 mV
Zone Setting Status: 755011

## 2023-06-14 ENCOUNTER — Encounter: Payer: Self-pay | Admitting: Internal Medicine

## 2023-06-20 ENCOUNTER — Ambulatory Visit
Payer: Medicare PPO | Attending: Student in an Organized Health Care Education/Training Program | Admitting: Student in an Organized Health Care Education/Training Program

## 2023-06-20 ENCOUNTER — Encounter: Payer: Self-pay | Admitting: Student in an Organized Health Care Education/Training Program

## 2023-06-20 DIAGNOSIS — G894 Chronic pain syndrome: Secondary | ICD-10-CM | POA: Insufficient documentation

## 2023-06-20 DIAGNOSIS — G5703 Lesion of sciatic nerve, bilateral lower limbs: Secondary | ICD-10-CM | POA: Diagnosis not present

## 2023-06-20 DIAGNOSIS — Z9889 Other specified postprocedural states: Secondary | ICD-10-CM | POA: Insufficient documentation

## 2023-06-20 DIAGNOSIS — M4125 Other idiopathic scoliosis, thoracolumbar region: Secondary | ICD-10-CM | POA: Diagnosis not present

## 2023-06-20 DIAGNOSIS — Z79891 Long term (current) use of opiate analgesic: Secondary | ICD-10-CM | POA: Diagnosis not present

## 2023-06-20 DIAGNOSIS — M461 Sacroiliitis, not elsewhere classified: Secondary | ICD-10-CM | POA: Diagnosis not present

## 2023-06-20 DIAGNOSIS — M47816 Spondylosis without myelopathy or radiculopathy, lumbar region: Secondary | ICD-10-CM | POA: Insufficient documentation

## 2023-06-20 DIAGNOSIS — G629 Polyneuropathy, unspecified: Secondary | ICD-10-CM | POA: Diagnosis not present

## 2023-06-20 MED ORDER — OXYCODONE-ACETAMINOPHEN 10-325 MG PO TABS: 1.0000 | ORAL_TABLET | Freq: Three times a day (TID) | ORAL | 0 refills | Status: AC | PRN

## 2023-06-20 MED ORDER — BUPRENORPHINE 20 MCG/HR TD PTWK: 1.0000 | MEDICATED_PATCH | TRANSDERMAL | 2 refills | Status: DC

## 2023-06-20 MED ORDER — OXYCODONE-ACETAMINOPHEN 10-325 MG PO TABS: 1.0000 | ORAL_TABLET | Freq: Three times a day (TID) | ORAL | 0 refills | Status: DC | PRN

## 2023-06-20 MED ORDER — TIZANIDINE HCL 4 MG PO TABS: 4.0000 mg | ORAL_TABLET | Freq: Three times a day (TID) | ORAL | 5 refills | Status: DC | PRN

## 2023-06-20 NOTE — Progress Notes (Signed)
Nursing Pain Medication Assessment:  Safety precautions to be maintained throughout the outpatient stay will include: orient to surroundings, keep bed in low position, maintain call bell within reach at all times, provide assistance with transfer out of bed and ambulation.  Medication Inspection Compliance: Pill count conducted under aseptic conditions, in front of the patient. Neither the pills nor the bottle was removed from the patient's sight at any time. Once count was completed pills were immediately returned to the patient in their original bottle.  Medication: Buprenorphine (Suboxone) Pill/Patch Count:  0 of 4 patches remain Pill/Patch Appearance: Markings consistent with prescribed medication Bottle Appearance: Standard pharmacy container. Clearly labeled. Filled Date: 01 / 04 / 2025 Last Medication intake:  Today  Oxycodone/apap 11/90 Filled 05/25/2023 today

## 2023-06-20 NOTE — Progress Notes (Signed)
PROVIDER NOTE: Information contained herein reflects review and annotations entered in association with encounter. Interpretation of such information and data should be left to medically-trained personnel. Information provided to patient can be located elsewhere in the medical record under "Patient Instructions". Document created using STT-dictation technology, any transcriptional errors that may result from process are unintentional.    Patient: Andrew Townsend  Service Category: E/M  Provider: Edward Jolly, MD  DOB: 07-Mar-1951  DOS: 06/20/2023  Referring Provider: Melida Quitter, PA  MRN: [id number]  Specialty: Interventional Pain Management  PCP: Melida Quitter, PA  Type: Established Patient  Setting: Ambulatory outpatient    Location: Office  Delivery: Face-to-face     HPI  [name], a 73 y.o. year old male, is here today because of his No primary diagnosis found.. [name] primary complain today is Back Pain (low)  Pertinent problems: 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 Pain Assessment: Severity of Chronic pain is reported as a 6 /10. Location: Back Lower/radiates into both buttocks. Onset: More than a month ago. Quality: Aching, Constant, Sharp. Timing: Constant. Modifying factor(s): meds, heat. Vitals:  height is 5\' 6"  (1.676 m) and weight is 122 lb (55.3 kg). His blood pressure is 147/84 (abnormal) and his pulse is 62. His respiration is 16 and oxygen saturation is 96%.  BMI: Estimated body mass index is 19.69 kg/m as calculated from the  following:   Height as of this encounter: 5\' 6"  (1.676 m).   Weight as of this encounter: 122 lb (55.3 kg). Last encounter: 12/18/2022. Last procedure: 01/23/2023.  Reason for encounter: medication management.    Patient follows up today for medication management.  No significant change in his medical history.  He is seeing a Development worker, international aid for his bilateral hernias.  He has had previous hernia surgery but has been told that he needs to have repeat hernia surgery.  He is still awaiting to get his spinal surgery date.  We discussed perioperative strategies for pain management.  I instructed him to stop his Butrans patch 7 days prior to his surgery.  I have provided him with a surgeons letter which indicates that his surgical team will be addressing his acute postoperative pain.  He is to continue his chronic pain medications as I have prescribed.  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  Pharmacotherapy Assessment  Analgesic: Percocet 10 mg every 8 hours as needed, quantity 90/month; MME equals 45; Butrans 20 mcg  Monitoring: [employer] PMP: PDMP reviewed during this encounter.       Pharmacotherapy: No side-effects or adverse reactions reported. Compliance: No problems identified. Effectiveness: Clinically acceptable.  Valerie Salts, RN  06/20/2023  2:17 PM  Sign when Signing Visit Nursing Pain Medication Assessment:  Safety precautions to be maintained throughout the outpatient stay will include: orient to surroundings, keep bed in low position, maintain call bell within reach at all times, provide assistance with transfer out of bed and ambulation.  Medication Inspection Compliance: Pill count conducted under aseptic conditions, in front of the patient. Neither the pills nor the bottle was  removed from the patient's sight at any time. Once count was completed pills were immediately returned to the patient in their  original bottle.  Medication: Buprenorphine (Suboxone) Pill/Patch Count:  0 of 4 patches remain Pill/Patch Appearance: Markings consistent with prescribed medication Bottle Appearance: Standard pharmacy container. Clearly labeled. Filled Date: 01 / 04 / 2025 Last Medication intake:  Today  Oxycodone/apap 11/90 Filled 05/25/2023 today  No results found for: "CBDTHCR" No results found for: "D8THCCBX" No results found for: "D9THCCBX"  UDS:  Summary  Date Value Ref Range Status  12/18/2022 Note  Final    Comment:    ==================================================================== [employer] [facility] (MW) ==================================================================== Test                             Result       Flag       Units  Drug Present and Declared for Prescription Verification   Oxycodone                      3473         EXPECTED   ng/mg creat   Oxymorphone                    1617         EXPECTED   ng/mg creat   Noroxycodone                   4722         EXPECTED   ng/mg creat   Noroxymorphone                 992          EXPECTED   ng/mg creat    Sources of oxycodone are scheduled prescription medications.    Oxymorphone, noroxycodone, and noroxymorphone are expected    metabolites of oxycodone. Oxymorphone is also available as a    scheduled prescription medication.    Buprenorphine                  21           EXPECTED   ng/mg creat   Norbuprenorphine               36           EXPECTED   ng/mg creat    Source of buprenorphine is a scheduled prescription medication.    Norbuprenorphine is an expected metabolite of buprenorphine.  ==================================================================== Test                      Result    Flag   Units      Ref Range   Creatinine              89               mg/dL      >=81 ==================================================================== Declared Medications:  The flagging and interpretation on this  report are based on the  following declared medications.  Unexpected results may arise from  inaccuracies in the declared medications.   **Note: The testing scope of this panel includes these medications:   Oxycodone (Percocet)   **Note: The testing scope of this panel does not include small to  moderate amounts of these reported medications:   Buprenorphine Patch (BuTrans)   **Note: The testing scope of this panel does not include the  following reported medications:   Acetaminophen (Percocet)  Albuterol  Allopurinol  Apixaban (Eliquis)  Empagliflozin (Jardiance)  Ezetimibe (Zetia)  Fluticasone (Trelegy)  Furosemide (Lasix)  Gabapentin (Neurontin)  Magnesium (Mag-Ox)  Metoprolol  Multivitamin  Nitroglycerin (Nitrostat)  Omeprazole  Oxybutynin  Potassium (Klor-Con)  Rosuvastatin  Tizanidine  Umeclidinium (Trelegy)  Vilanterol (Trelegy)  Zolpidem (Ambien) ==================================================================== For clinical consultation, please call [phone]. ====================================================================       ROS  Constitutional: Denies any fever or chills Gastrointestinal: No reported hemesis, hematochezia, vomiting, or acute GI distress Musculoskeletal:  Low back pain with radiation into right leg Neurological:  Numbness and tingling of right thigh and right leg  Medication Review  Fluticasone-Umeclidin-Vilant, Magnesium Oxide, albuterol, allopurinol, apixaban, buprenorphine, empagliflozin, ezetimibe, furosemide, gabapentin, metoprolol succinate, multivitamin, nitroGLYCERIN, omeprazole, oxyCODONE-acetaminophen, oxybutynin, potassium chloride, ramelteon, rosuvastatin, and tiZANidine  History Review  Allergy: [name] has no known allergies. Drug: [name]  reports no history of drug use. Alcohol:  has no history on file for alcohol use. Tobacco:  reports that he has been smoking e-cigarettes. He has never used  smokeless tobacco. Social: [name]  reports that he has been smoking e-cigarettes. He has never used smokeless tobacco. He reports that he does not use drugs. Medical:  has a past medical history of Arthritis, Blood transfusion without reported diagnosis, CAD (coronary artery disease), Cataract, Chronic kidney disease, COPD (chronic obstructive pulmonary disease) (HCC), GERD (gastroesophageal reflux disease), Hyperlipidemia, Hypertension, 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), Myocardial infarction (HCC), and Stroke (HCC). Surgical: [name]  has a past surgical history that includes Coronary artery bypass graft and Hernia repair. Family: family history is not on file.  Laboratory Chemistry Profile   Renal Lab Results  Component Value Date   BUN 17 04/17/2023   CREATININE 1.43 (H) 04/17/2023   BCR 12 04/17/2023    Hepatic Lab Results  Component Value Date   AST 26 04/17/2023   ALT 32 04/17/2023   ALBUMIN 4.4 04/17/2023   ALKPHOS 99 04/17/2023    Electrolytes Lab Results  Component Value Date   NA 142 04/17/2023   K 4.5 04/17/2023   CL 105 04/17/2023   CALCIUM 9.8 04/17/2023   MG 2.1 07/31/2022    Bone Lab Results  Component Value Date   VD25OH 64.6 11/07/2022    Inflammation (CRP: Acute Phase) (ESR: Chronic Phase) Lab Results  Component Value Date   CRP 18 (H) 06/26/2022   ESRSEDRATE 6 06/26/2022         Note: Above Lab results reviewed.  Recent Imaging Review  CLINICAL DATA:  Chronic lower back pain radiating to the bilateral legs, right-greater-than-left.   EXAM: LUMBAR SPINE - COMPLETE WITH BENDING VIEWS   COMPARISON:  None Available.   FINDINGS: There is diffuse decreased bone mineralization. There are hypoplastic ribs at the vertebral body considered T12. Distal to this the next 5 vertebral bodies are considered L1 through L5. The superior aspect of the iliac crests is at the level of the mid to superior and mid to  inferior aspect of the L4 vertebral body on lateral view.   There is 6 mm retrolisthesis of L1 on L2, unchanged on neutral, flexion, and extension views. Moderate to severe levocurvature centered at L2.   Severe right L1-2 and L2-3 and moderate right L3-4 and L4-5 disc space narrowing with multilevel degenerative endplate osteophytes. Moderate to severe L5-S1 disc space narrowing with vacuum phenomenon.   Facet joint arthropathy is greatest at L5-S1.   Partial visualization of cardiac  AICD leads.   Hernia mesh coils overlie the pelvis.   Moderate atherosclerotic calcifications within the abdominal aorta. A vascular stent overlies the L1-2 disc level.   IMPRESSION: 1. Moderate to severe levocurvature centered at L2. 2. Severe multilevel degenerative disc and endplate changes. 3. Mild retrolisthesis of L1 on L2.     Electronically Signed   By: Neita Garnet M.D.   On: 12/08/2021 11:18  Physical Exam  General appearance: Well nourished, well developed, and well hydrated. In no apparent acute distress Mental status: Alert, oriented x 3 (person, place, & time)       Respiratory: No evidence of acute respiratory distress Eyes: PERLA Vitals: BP (!) 147/84   Pulse 62   Resp 16   Ht 5\' 6"  (1.676 m)   Wt 122 lb (55.3 kg)   SpO2 96%   BMI 19.69 kg/m  BMI: Estimated body mass index is 19.69 kg/m as calculated from the following:   Height as of this encounter: 5\' 6"  (1.676 m).   Weight as of this encounter: 122 lb (55.3 kg). Ideal: Ideal body weight: 63.8 kg (140 lb 10.5 oz)  Lumbar Spine Area Exam  Skin & Axial Inspection: No masses, redness, or swelling Alignment: Symmetrical Functional ROM: Pain restricted ROM affecting primarily the right Stability: No instability detected Muscle Tone/Strength: Functionally intact. No obvious neuro-muscular anomalies detected. Sensory (Neurological): Dermatomal pain pattern right L3/4 Palpation: No palpable anomalies       Provocative  Tests: Hyperextension/rotation test: (+) due to pain. Lumbar quadrant test (Kemp's test): (+) on the right for foraminal stenosis  Gait & Posture Assessment  Ambulation: Unassisted Gait: Relatively normal for age and body habitus Posture: WNL  Lower Extremity Exam    Side: Right lower extremity  Side: Left lower extremity  Stability: No instability observed          Stability: No instability observed          Skin & Extremity Inspection: Skin color, temperature, and hair growth are WNL. No peripheral edema or cyanosis. No masses, redness, swelling, asymmetry, or associated skin lesions. No contractures.  Skin & Extremity Inspection: Skin color, temperature, and hair growth are WNL. No peripheral edema or cyanosis. No masses, redness, swelling, asymmetry, or associated skin lesions. No contractures.  Functional ROM: Pain restricted ROM for hip and knee joints          Functional ROM: Unrestricted ROM                  Muscle Tone/Strength: Functionally intact. No obvious neuro-muscular anomalies detected.  Muscle Tone/Strength: Functionally intact. No obvious neuro-muscular anomalies detected.  Sensory (Neurological): Neurogenic pain pattern        Sensory (Neurological): Unimpaired        DTR: Patellar: deferred today Achilles: deferred today Plantar: deferred today  DTR: Patellar: deferred today Achilles: deferred today Plantar: deferred today  Palpation: No palpable anomalies  Palpation: No palpable anomalies    Assessment   Diagnosis  1. 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 Piriformis syndrome of both sides   3. Chronic pain syndrome   4. Lumbar facet arthropathy   5. Encounter for long-term opiate analgesic use   6. Neuropathy   7. Other idiopathic scoliosis, thoracolumbar region   8. Hx of shoulder surgery (left rotator cuff surgery, right rotator cuff then reverse total shoulder arthoplasty)      Updated Problems: No problems updated.   Plan of Care  [name] has a current medication list which includes the following long-term medication(s): albuterol, albuterol, allopurinol, eliquis, ezetimibe, furosemide, gabapentin, metoprolol succinate, nitroglycerin, omeprazole, potassium chloride, ramelteon, rosuvastatin, and tizanidine.  Pharmacotherapy (Medications Ordered): Meds ordered this encounter  Medications   oxyCODONE-acetaminophen (PERCOCET) 10-325 MG tablet    Sig: Take 1 tablet by mouth every 8 (eight) hours as needed for pain.    Dispense:  90 tablet    Refill:  0   oxyCODONE-acetaminophen (PERCOCET) 10-325 MG tablet    Sig: Take 1 tablet by mouth every 8 (eight) hours as needed for pain.    Dispense:  90 tablet    Refill:  0   oxyCODONE-acetaminophen (PERCOCET) 10-325 MG tablet    Sig: Take 1 tablet by mouth every 8 (eight) hours as needed for pain.    Dispense:  90 tablet    Refill:  0   buprenorphine (BUTRANS) 20 MCG/HR PTWK    Sig: Place 1 patch onto the skin once a week.    Dispense:  4 patch    Refill:  2   tiZANidine (ZANAFLEX) 4 MG tablet    Sig: Take 1 tablet (4 mg total) by mouth every 8 (eight) hours as needed.    Dispense:  90 tablet    Refill:  5   Orders:  No orders of the defined types were placed in this encounter.  Follow-up plan:   Return in about 13 weeks (around 09/19/2023) for MM, F2F.      B/L SI joint and piriformis 01/03/2022, 02/07/22     Recent Visits Date Type Provider Dept  04/10/23 Procedure visit Edward Jolly, MD [facility]  03/26/23 Office Visit Edward Jolly, MD [facility]  Showing recent visits within past 90 days and meeting all other requirements Today's Visits Date Type Provider Dept  06/20/23 Office Visit Edward Jolly, MD [facility]  Showing today's visits and meeting all other requirements Future Appointments No visits were found meeting these conditions. Showing future appointments within next 90 days and meeting all other  requirements  I discussed the assessment and treatment plan with the patient. The patient was provided an opportunity to ask questions and all were answered. The patient agreed with the plan and demonstrated an understanding of the instructions.  Patient advised to call back or seek an in-person evaluation if the symptoms or condition worsens.  Duration of encounter: 30 minutes.  Total time on encounter, as per AMA guidelines included both the face-to-face and non-face-to-face time personally spent by the physician and/or other qualified health care professional(s) on the day of the encounter (includes time in activities that require the physician or other qualified health care professional and does not include time in activities normally performed by clinical staff). Physician's time may include the following activities when performed: Preparing to see the patient (e.g., pre-charting review of records, searching for previously ordered imaging, lab work, and nerve conduction tests) Review of prior analgesic pharmacotherapies. Reviewing PMP Interpreting ordered tests (e.g., lab work, imaging, nerve conduction tests) Performing post-procedure evaluations, including interpretation of diagnostic procedures Obtaining and/or reviewing separately obtained history Performing a medically appropriate examination and/or evaluation Counseling and educating the patient/family/caregiver Ordering medications, tests, or procedures Referring and communicating with other health care professionals (when not separately reported) Documenting clinical information in the electronic or other health record Independently interpreting results (not separately reported) and communicating results to the patient/ family/caregiver Care coordination (not separately reported)  Note by: Edward Jolly, MD Date: 06/20/2023;  Time: 2:31 PM

## 2023-06-20 NOTE — Patient Instructions (Signed)

## 2023-06-25 ENCOUNTER — Encounter: Payer: Medicare PPO | Admitting: Student in an Organized Health Care Education/Training Program

## 2023-06-27 ENCOUNTER — Encounter: Payer: Self-pay | Admitting: Family Medicine

## 2023-07-11 ENCOUNTER — Ambulatory Visit: Payer: Medicare PPO

## 2023-07-11 DIAGNOSIS — Z0000 Encounter for general adult medical examination without abnormal findings: Secondary | ICD-10-CM | POA: Diagnosis not present

## 2023-07-11 NOTE — Patient Instructions (Signed)
Andrew Townsend , Thank you for taking time to come for your Medicare Wellness Visit. I appreciate your ongoing commitment to your health goals. Please review the following plan we discussed and let me know if I can assist you in the future.   Referrals/Orders/Follow-Ups/Clinician Recommendations: none  Managing Pain Without Opioids Opioids are strong medicines used to treat moderate to severe pain. For some people, especially those who have long-term (chronic) pain, opioids may not be the best choice for pain management due to: Side effects like nausea, constipation, and sleepiness. The risk of addiction (opioid use disorder). The longer you take opioids, the greater your risk of addiction. Pain that lasts for more than 3 months is called chronic pain. Managing chronic pain usually requires more than one approach and is often provided by a team of health care providers working together (multidisciplinary approach). Pain management may be done at a pain management center or pain clinic. How to manage pain without the use of opioids Use non-opioid medicines Non-opioid medicines for pain may include: Over-the-counter or prescription non-steroidal anti-inflammatory drugs (NSAIDs). These may be the first medicines used for pain. They work well for muscle and bone pain, and they reduce swelling. Acetaminophen. This over-the-counter medicine may work well for milder pain but not swelling. Antidepressants. These may be used to treat chronic pain. A certain type of antidepressant (tricyclics) is often used. These medicines are given in lower doses for pain than when used for depression. Anticonvulsants. These are usually used to treat seizures but may also reduce nerve (neuropathic) pain. Muscle relaxants. These relieve pain caused by sudden muscle tightening (spasms). You may also use a pain medicine that is applied to the skin as a patch, cream, or gel (topical analgesic), such as a numbing medicine. These may  cause fewer side effects than medicines taken by mouth. Do certain therapies as directed Some therapies can help with pain management. They include: Physical therapy. You will do exercises to gain strength and flexibility. A physical therapist may teach you exercises to move and stretch parts of your body that are weak, stiff, or painful. You can learn these exercises at physical therapy visits and practice them at home. Physical therapy may also involve: Massage. Heat wraps or applying heat or cold to affected areas. Electrical signals that interrupt pain signals (transcutaneous electrical nerve stimulation, TENS). Weak lasers that reduce pain and swelling (low-level laser therapy). Signals from your body that help you learn to regulate pain (biofeedback). Occupational therapy. This helps you to learn ways to function at home and work with less pain. Recreational therapy. This involves trying new activities or hobbies, such as a physical activity or drawing. Mental health therapy, including: Cognitive behavioral therapy (CBT). This helps you learn coping skills for dealing with pain. Acceptance and commitment therapy (ACT) to change the way you think and react to pain. Relaxation therapies, including muscle relaxation exercises and mindfulness-based stress reduction. Pain management counseling. This may be individual, family, or group counseling.  Receive medical treatments Medical treatments for pain management include: Nerve block injections. These may include a pain blocker and anti-inflammatory medicines. You may have injections: Near the spine to relieve chronic back or neck pain. Into joints to relieve back or joint pain. Into nerve areas that supply a painful area to relieve body pain. Into muscles (trigger point injections) to relieve some painful muscle conditions. A medical device placed near your spine to help block pain signals and relieve nerve pain or chronic back pain (spinal  cord stimulation device). Acupuncture. Follow these instructions at home Medicines Take over-the-counter and prescription medicines only as told by your health care provider. If you are taking pain medicine, ask your health care providers about possible side effects to watch out for. Do not drive or use heavy machinery while taking prescription opioid pain medicine. Lifestyle  Do not use drugs or alcohol to reduce pain. If you drink alcohol, limit how much you have to: 0-1 drink a day for women who are not pregnant. 0-2 drinks a day for men. Know how much alcohol is in a drink. In the [place] one drink equals one 12 oz bottle of beer (355 mL), one 5 oz glass of wine (148 mL), or one 1 oz glass of hard liquor (44 mL). Do not use any products that contain nicotine or tobacco. These products include cigarettes, chewing tobacco, and vaping devices, such as e-cigarettes. If you need help quitting, ask your health care provider. Eat a healthy diet and maintain a healthy weight. Poor diet and excess weight may make pain worse. Eat foods that are high in fiber. These include fresh fruits and vegetables, whole grains, and beans. Limit foods that are high in fat and processed sugars, such as fried and sweet foods. Exercise regularly. Exercise lowers stress and may help relieve pain. Ask your health care provider what activities and exercises are safe for you. If your health care provider approves, join an exercise class that combines movement and stress reduction. Examples include yoga and tai chi. Get enough sleep. Lack of sleep may make pain worse. Lower stress as much as possible. Practice stress reduction techniques as told by your therapist. General instructions Work with all your pain management providers to find the treatments that work best for you. You are an important member of your pain management team. There are many things you can do to reduce pain on your own. Consider joining an online or  in-person support group for people who have chronic pain. Keep all follow-up visits. This is important. Where to find more information You can find more information about managing pain without opioids from: American Academy of Pain Medicine: painmed.org Institute for Chronic Pain: instituteforchronicpain.org American Chronic Pain Association: theacpa.org Contact a health care provider if: You have side effects from pain medicine. Your pain gets worse or does not get better with treatments or home therapy. You are struggling with anxiety or depression. Summary Many types of pain can be managed without opioids. Chronic pain may respond better to pain management without opioids. Pain is best managed when you and a team of health care providers work together. Pain management without opioids may include non-opioid medicines, medical treatments, physical therapy, mental health therapy, and lifestyle changes. Tell your health care providers if your pain gets worse or is not being managed well enough. This information is not intended to replace advice given to you by your health care provider. Make sure you discuss any questions you have with your health care provider. Document Revised: 08/17/2020 Document Reviewed: 08/17/2020 Elsevier Patient Education  2024 Elsevier Inc.  This is a list of the screening recommended for you and due dates:  Health Maintenance  Topic Date Due   Hepatitis C Screening  Never done   COVID-19 Vaccine (6 - 2024-25 season) 03/01/2023   Medicare Annual Wellness Visit  07/10/2023   Colon Cancer Screening  09/17/2029   DTaP/Tdap/Td vaccine (2 - Td or Tdap) 07/10/2032   Pneumonia Vaccine  Completed   Flu Shot  Completed   Zoster (Shingles) Vaccine  Completed   HPV Vaccine  Aged Out    Advanced directives: (In Chart) A copy of your advanced directives are scanned into your chart should your provider ever need it.  Next Medicare Annual Wellness Visit scheduled for  next year: Yes  insert Preventive Care attachment Insert FALL PREVENTION attachment if needed

## 2023-07-11 NOTE — Progress Notes (Signed)
Subjective:   Andrew Townsend is a 73 y.o. who presents for a Medicare Wellness preventive visit.  Visit Complete: Virtual I connected with  Andrew Townsend on 07/11/23 by a video and audio enabled telemedicine application and verified that I am speaking with the correct person using two identifiers.  Patient Location: Home  Provider Location: Home Office  I discussed the limitations of evaluation and management by telemedicine. The patient expressed understanding and agreed to proceed.  Vital Signs: Because this visit was a virtual/telehealth visit, some criteria may be missing or patient reported. Any vitals not documented were not able to be obtained and vitals that have been documented are patient reported.    AWV Questionnaire: Yes: Patient Medicare AWV questionnaire was completed by the patient on 07/09/2023; I have confirmed that all information answered by patient is correct and no changes since this date.  Cardiac Risk Factors include: advanced age (>45men, >64 women);dyslipidemia;hypertension;male gender     Objective:    Today's Vitals   07/11/23 1044  PainSc: 6    There is no height or weight on file to calculate BMI.     07/11/2023   10:58 AM 06/20/2023    2:13 PM 04/10/2023   10:29 AM 03/26/2023    1:28 PM 01/23/2023   10:38 AM 01/02/2023   10:36 AM 12/18/2022   10:40 AM  Advanced Directives  Does Patient Have a Medical Advance Directive? Yes Yes Yes Yes Yes Yes Yes  Type of Estate agent of [place];Living will  Healthcare Power of [place];Living will Healthcare Power of [employer] of [place];Living will Healthcare Power of [place];Living will   Does patient want to make changes to medical advance directive?     No - Patient declined    Copy of Healthcare Power of Attorney in Chart? Yes - validated most recent copy scanned in chart (See row information)          Current Medications (verified) Outpatient Encounter Medications as of  07/11/2023  Medication Sig   albuterol (PROVENTIL) (2.5 MG/3ML) 0.083% nebulizer solution INHALE THE CONTENTS OF 1 VIAL VIA NEBULIZER EVERY 6 HOURS AS NEEDED FOR WHEEZING OR SHORTNESS OF BREATH.   albuterol (VENTOLIN HFA) 108 (90 Base) MCG/ACT inhaler Inhale 2 puffs into the lungs every 6 (six) hours as needed for wheezing or shortness of breath.   allopurinol (ZYLOPRIM) 100 MG tablet Take 1 tablet (100 mg total) by mouth daily.   apixaban (ELIQUIS) 2.5 MG TABS tablet TAKE 1 TABLET BY MOUTH 2 TIMES DAILY.   buprenorphine (BUTRANS) 20 MCG/HR PTWK Place 1 patch onto the skin once a week.   empagliflozin (JARDIANCE) 10 MG TABS tablet Take 10 mg by mouth daily.   ezetimibe (ZETIA) 10 MG tablet Take 1 tablet (10 mg total) by mouth daily.   Fluticasone-Umeclidin-Vilant (TRELEGY ELLIPTA) 100-62.5-25 MCG/ACT AEPB Inhale 1 puff into the lungs daily.   furosemide (LASIX) 40 MG tablet Take 1 tablet (40 mg total) by mouth daily as needed for fluid.   gabapentin (NEURONTIN) 300 MG capsule Take 3 capsules (900 mg total) by mouth at bedtime.   Magnesium Oxide (DIASENSE MAGNESIUM PO) Take 800 mg by mouth daily at 6 (six) AM.   metoprolol succinate (TOPROL-XL) 100 MG 24 hr tablet Take 1 tablet (100 mg total) by mouth daily. PLEASE NOTE DECREASED DOSE.   Multiple Vitamin (MULTIVITAMIN) tablet Take 1 tablet by mouth daily.   nitroGLYCERIN (NITROSTAT) 0.4 MG SL tablet Place 1 tablet (0.4 mg total) under the  tongue every 5 (five) minutes as needed for chest pain. If second dose is needed to relieve chest pain, seek immediate emergency medical attention.   omeprazole (PRILOSEC) 20 MG capsule Take 1 capsule (20 mg total) by mouth daily.   oxybutynin (DITROPAN-XL) 10 MG 24 hr tablet TAKE 1 TABLET (10 MG TOTAL) BY MOUTH DAILY.   oxyCODONE-acetaminophen (PERCOCET) 10-325 MG tablet Take 1 tablet by mouth every 8 (eight) hours as needed for pain.   potassium chloride (KLOR-CON M) 10 MEQ tablet Take 10 mEq by mouth daily.    ramelteon (ROZEREM) 8 MG tablet Take 1 tablet (8 mg total) by mouth at bedtime.   rosuvastatin (CRESTOR) 40 MG tablet Take 1 tablet (40 mg total) by mouth at bedtime.   tiZANidine (ZANAFLEX) 4 MG tablet Take 1 tablet (4 mg total) by mouth every 8 (eight) hours as needed.   [START ON 07/24/2023] oxyCODONE-acetaminophen (PERCOCET) 10-325 MG tablet Take 1 tablet by mouth every 8 (eight) hours as needed for pain.   [START ON 08/23/2023] oxyCODONE-acetaminophen (PERCOCET) 10-325 MG tablet Take 1 tablet by mouth every 8 (eight) hours as needed for pain.   No facility-administered encounter medications on file as of 07/11/2023.    Allergies (verified) Patient has no known allergies.   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 No pertinent family history. Social History   Socioeconomic History   Marital status: Widowed    Spouse name: Not on file   Number of children: Not on file   Years of education: Not on file   Highest education level: GED or equivalent  Occupational History   Not on file  Tobacco Use   Smoking status: Every Day    Types: E-cigarettes   Smokeless tobacco: Never  Vaping Use   Vaping status: Some Days  Substance and Sexual Activity   Alcohol use: Not Currently   Drug use: Never   Sexual activity: Not Currently  Other Topics Concern   Not on file  Social History Narrative   Not on file   Social Drivers of Health   Financial Resource Strain: Low  Risk  (07/11/2023)   Overall Financial Resource Strain (CARDIA)    Difficulty of Paying Living Expenses: Not hard at all  Food Insecurity: No Food Insecurity (07/11/2023)   Hunger Vital Sign    Worried About Running Out of Food in the Last Year: Never true    Ran Out of Food in the Last Year: Never true  Transportation Needs: No Transportation Needs (07/11/2023)   PRAPARE - Administrator, Civil Service (Medical): No    Lack of Transportation (Non-Medical): No  Physical Activity: Inactive (07/11/2023)   Exercise Vital Sign    Days of Exercise per Week: 0 days    Minutes of Exercise per Session: 0 min  Stress: No Stress Concern Present (07/11/2023)   [employer] of Occupational Health - Occupational Stress Questionnaire  Feeling of Stress : Not at all  Social Connections: Moderately Isolated (07/11/2023)   Social Connection and Isolation Panel [NHANES]    Frequency of Communication with Friends and Family: Twice a week    Frequency of Social Gatherings with Friends and Family: More than three times a week    Attends Religious Services: Never    Database administrator or Organizations: Yes    Attends Engineer, structural: More than 4 times per year    Marital Status: Widowed    Tobacco Counseling Ready to quit: Not Answered Counseling given: Not Answered    Clinical Intake:  Pre-visit preparation completed: Yes  Pain : 0-10 Pain Score: 6  Pain Type: Chronic pain Pain Location: Back Pain Orientation: Lower Pain Descriptors / Indicators: Aching Pain Onset: More than a month ago Pain Frequency: Constant     Nutritional Risks: None Diabetes: No  How often do you need to have someone help you when you read instructions, pamphlets, or other written materials from your doctor or pharmacy?: 1 - Never  Interpreter Needed?: No  Information entered by :: NAllen LPN   Activities of Daily Living     07/09/2023   10:07 AM  In your present state of  health, do you have any difficulty performing the following activities:  Hearing? 0  Vision? 0  Difficulty concentrating or making decisions? 0  Walking or climbing stairs? 1  Comment due to back pain  Dressing or bathing? 0  Doing errands, shopping? 0  Preparing Food and eating ? N  Using the Toilet? N  In the past six months, have you accidently leaked urine? N  Do you have problems with loss of bowel control? N  Managing your Medications? N  Managing your Finances? N  Housekeeping or managing your Housekeeping? N    Patient Care Team: Melida Quitter, PA as PCP - General (Family Medicine) Jodelle Red, MD as PCP - Cardiology (Cardiology) Lorain Childes, MD (Nephrology)  Indicate any recent Medical Services you may have received from other than [facility] providers in the past year (date may be approximate).     Assessment:   This is a routine wellness examination for [employer].  Hearing/Vision screen Hearing Screening - Comments:: Denies hearing issues Vision Screening - Comments:: Regular eye exams   Goals Addressed             This Visit's Progress    Patient Stated       07/11/2023, wants to get over surgery and be able to more mobile       Depression Screen     07/11/2023   11:00 AM 06/20/2023    2:12 PM 05/09/2023    8:59 AM 11/07/2022   10:56 AM 09/18/2022    1:42 PM 09/11/2022    2:01 PM 07/09/2022    1:52 PM  PHQ 2/9 Scores  PHQ - 2 Score 0 0 0 0 0 0 0  PHQ- 9 Score 1  3 0   2    Fall Risk     07/09/2023   10:07 AM 06/20/2023    2:12 PM 04/10/2023   10:29 AM 03/26/2023    1:28 PM 01/23/2023   10:38 AM  Fall Risk   Falls in the past year? 0 0 0 0 0  Number falls in past yr: 0  0 0 0  Injury with Fall? 0   0   Risk for fall due to : Impaired mobility;Impaired balance/gait;Medication side effect  No Fall Risks  No Fall Risks  Follow up Falls prevention discussed;Falls evaluation completed  Falls evaluation completed  Falls evaluation completed     MEDICARE RISK AT HOME:  Medicare Risk at Home Any stairs in or around the home?: (Patient-Rptd) Yes If so, are there any without handrails?: (Patient-Rptd) No Home free of loose throw rugs in walkways, pet beds, electrical cords, etc?: (Patient-Rptd) Yes Adequate lighting in your home to reduce risk of falls?: (Patient-Rptd) Yes Life alert?: (Patient-Rptd) No Use of a cane, walker or w/c?: (Patient-Rptd) No Grab bars in the bathroom?: (Patient-Rptd) Yes Shower chair or bench in shower?: (Patient-Rptd) No Elevated toilet seat or a handicapped toilet?: (Patient-Rptd) No  TIMED UP AND GO:  Was the test performed?  No  Cognitive Function: 6CIT completed        07/11/2023   11:02 AM 07/09/2022    1:56 PM  6CIT Screen  What Year? 0 points 0 points  What month? 0 points 0 points  What time? 0 points 0 points  Count back from 20 0 points 0 points  Months in reverse 0 points 2 points  Repeat phrase 0 points 0 points  Total Score 0 points 2 points    Immunizations Immunization History  Administered Date(s) Administered   H1N1 05/07/2008   Influenza, High Dose Seasonal PF 02/25/2018, 01/04/2023   Influenza-Unspecified 02/10/2019, 02/05/2022, 02/06/2022   Moderna Sars-Covid-2 Vaccination 05/18/2020, 12/27/2020   PFIZER(Purple Top)SARS-COV-2 Vaccination 07/15/2019, 08/04/2019, 01/04/2023   PNEUMOCOCCAL CONJUGATE-20 07/10/2022   Pneumococcal-Unspecified 12/16/2019   RSV,unspecified 05/23/2022   Tdap 07/10/2022   Zoster Recombinant(Shingrix) 03/31/2021, 06/12/2021    Screening Tests Health Maintenance  Topic Date Due   Hepatitis C Screening  Never done   COVID-19 Vaccine (6 - 2024-25 season) 03/01/2023   Medicare Annual Wellness (AWV)  07/10/2023   Colonoscopy  09/17/2029   DTaP/Tdap/Td (2 - Td or Tdap) 07/10/2032   Pneumonia Vaccine 47+ Years old  Completed   INFLUENZA VACCINE  Completed   Zoster Vaccines- Shingrix  Completed   HPV VACCINES  Aged Out    Health  Maintenance  Health Maintenance Due  Topic Date Due   Hepatitis C Screening  Never done   COVID-19 Vaccine (6 - 2024-25 season) 03/01/2023   Medicare Annual Wellness (AWV)  07/10/2023   Health Maintenance Items Addressed: Hepatitis C Screening due  Additional Screening:  Vision Screening: Recommended annual ophthalmology exams for early detection of glaucoma and other disorders of the eye.  Dental Screening: Recommended annual dental exams for proper oral hygiene  Community Resource Referral / Chronic Care Management: CRR required this visit?  No   CCM required this visit?  No     Plan:     I have personally reviewed and noted the following in the patient's chart:   Medical and social history Use of alcohol, tobacco or illicit drugs  Current medications and supplements including opioid prescriptions. Patient is currently taking opioid prescriptions. Information provided to patient regarding non-opioid alternatives. Patient advised to discuss non-opioid treatment plan with their provider. Functional ability and status Nutritional status Physical activity Advanced directives List of other physicians Hospitalizations, surgeries, and ER visits in previous 12 months Vitals Screenings to include cognitive, depression, and falls Referrals and appointments  In addition, I have reviewed and discussed with patient certain preventive protocols, quality metrics, and best practice recommendations. A written personalized care plan for preventive services as well as general preventive health recommendations were provided to patient.     Andrew Townsend  Andrew Busman, LPN   1/61/0960   After Visit Summary: (MyChart) Due to this being a telephonic visit, the after visit summary with patients personalized plan was offered to patient via MyChart   Notes: Nothing significant to report at this time.

## 2023-07-17 ENCOUNTER — Telehealth: Payer: Self-pay | Admitting: *Deleted

## 2023-07-17 NOTE — Telephone Encounter (Signed)
 Copied from CRM [id number]. Topic: Clinical - Medication Question >> Jul 17, 2023 10:33 AM Gaetano Hawthorne wrote: Reason for CRM: Patient has some questions regarding the dose of medications that he is suppose to be taking on the following medications:  omeprazole (PRILOSEC) 20 MG capsule - 20 or 40? (he's still been taking 40 mg)  metoprolol succinate (TOPROL-XL) 100 MG 24 hr tablet (has been taking 1 instead of the 1 and half tablet that he had been taking prior)  Please call the patient with clarification and confirmation.

## 2023-07-17 NOTE — Pre-Procedure Instructions (Signed)
 Surgical Instructions   Your procedure is scheduled on July 26, 2023. Report to [facility] Main Entrance "A" at 7:30 A.M., then check in with the Admitting office. Any questions or running late day of surgery: call [phone]  Questions prior to your surgery date: call [phone], Monday-Friday, 8am-4pm. If you experience any cold or flu symptoms such as cough, fever, chills, shortness of breath, etc. between now and your scheduled surgery, please notify us at the above number.     Remember:  Do not eat after midnight the night before your surgery   You may drink clear liquids until 6:30 AM the morning of your surgery.   Clear liquids allowed are: Water, Non-Citrus Juices (without pulp), Carbonated Beverages, Clear Tea (no milk, honey, etc.), Black Coffee Only (NO MILK, CREAM OR POWDERED CREAMER of any kind), and Gatorade.  Patient Instructions  The night before surgery:  No food after midnight. ONLY clear liquids after midnight  The day of surgery (if you do NOT have diabetes):  Drink ONE (1) Pre-Surgery Clear Ensure by 6:30 AM the morning of surgery. Drink in one sitting. Do not sip.  This drink was given to you during your hospital  pre-op appointment visit.  Nothing else to drink after completing the  Pre-Surgery Clear Ensure.         If you have questions, please contact your surgeon's office.    Take these medicines the morning of surgery with A SIP OF WATER: allopurinol (ZYLOPRIM) ezetimibe (ZETIA)   Fluticasone-Umeclidin-Vilant (TRELEGY ELLIPTA)  metoprolol succinate (TOPROL-XL)  omeprazole (PRILOSEC)    May take these medicines IF NEEDED: albuterol (PROVENTIL) nebulizer solution  albuterol (VENTOLIN HFA) inhaler - please bring inhaler with you morning of surgery docusate sodium (COLACE)  nitroGLYCERIN (NITROSTAT) - if dose taken prior to surgery, please call either of the above phone numbers oxyCODONE-acetaminophen (PERCOCET)  tiZANidine (ZANAFLEX)     STOP taking your apixaban (ELIQUIS) two days prior to surgery.  STOP taking your empagliflozin (JARDIANCE) three days prior to surgery. Your last dose will be March 3rd.   One week prior to surgery, STOP taking any Aspirin (unless otherwise instructed by your surgeon) Aleve, Naproxen, Ibuprofen, Motrin, Advil, Goody's, BC's, all herbal medications, fish oil, and non-prescription vitamins.                     Do NOT Smoke (Tobacco/Vaping) for 24 hours prior to your procedure.  If you use a CPAP at night, you may bring your mask/headgear for your overnight stay.   You will be asked to remove any contacts, glasses, piercing's, hearing aid's, dentures/partials prior to surgery. Please bring cases for these items if needed.    Patients discharged the day of surgery will not be allowed to drive home, and someone needs to stay with them for 24 hours.  SURGICAL WAITING ROOM VISITATION Patients may have no more than 2 support people in the waiting area - these visitors may rotate.   Pre-op nurse will coordinate an appropriate time for 1 ADULT support person, who may not rotate, to accompany patient in pre-op.  Children under the age of 81 must have an adult with them who is not the patient and must remain in the main waiting area with an adult.  If the patient needs to stay at the hospital during part of their recovery, the visitor guidelines for inpatient rooms apply.  Please refer to the [facility] website for the visitor guidelines for any additional information.  If you received a COVID test during your pre-op visit  it is requested that you wear a mask when out in public, stay away from anyone that may not be feeling well and notify your surgeon if you develop symptoms. If you have been in contact with anyone that has tested positive in the last 10 days please notify you surgeon.      Pre-operative CHG Bathing Instructions   You can play a key role in reducing the risk of  infection after surgery. Your skin needs to be as free of germs as possible. You can reduce the number of germs on your skin by washing with CHG (chlorhexidine gluconate) soap before surgery. CHG is an antiseptic soap that kills germs and continues to kill germs even after washing.   DO NOT use if you have an allergy to chlorhexidine/CHG or antibacterial soaps. If your skin becomes reddened or irritated, stop using the CHG and notify one of our RNs at [phone].              TAKE A SHOWER THE NIGHT BEFORE SURGERY AND THE DAY OF SURGERY    Please keep in mind the following:  DO NOT shave, including legs and underarms, 48 hours prior to surgery.   You may shave your face before/day of surgery.  Place clean sheets on your bed the night before surgery Use a clean washcloth (not used since being washed) for each shower. DO NOT sleep with pet's night before surgery.  CHG Shower Instructions:  Wash your face and private area with normal soap. If you choose to wash your hair, wash first with your normal shampoo.  After you use shampoo/soap, rinse your hair and body thoroughly to remove shampoo/soap residue.  Turn the water OFF and apply half the bottle of CHG soap to a CLEAN washcloth.  Apply CHG soap ONLY FROM YOUR NECK DOWN TO YOUR TOES (washing for 3-5 minutes)  DO NOT use CHG soap on face, private areas, open wounds, or sores.  Pay special attention to the area where your surgery is being performed.  If you are having back surgery, having someone wash your back for you may be helpful. Wait 2 minutes after CHG soap is applied, then you may rinse off the CHG soap.  Pat dry with a clean towel  Put on clean pajamas    Additional instructions for the day of surgery: DO NOT APPLY any lotions, deodorants, cologne, or perfumes.   Do not wear jewelry or makeup Do not wear nail polish, gel polish, artificial nails, or any other type of covering on natural nails (fingers and toes) Do not bring  valuables to the hospital. [facility] is not responsible for valuables/personal belongings. Put on clean/comfortable clothes.  Please brush your teeth.  Ask your nurse before applying any prescription medications to the skin.

## 2023-07-18 ENCOUNTER — Encounter (HOSPITAL_COMMUNITY): Payer: Self-pay

## 2023-07-18 ENCOUNTER — Encounter (HOSPITAL_COMMUNITY)
Admission: RE | Admit: 2023-07-18 | Discharge: 2023-07-18 | Disposition: A | Discharge status: Home / Self Care | Payer: Medicare PPO | Source: Ambulatory Visit | Attending: General Surgery | Admitting: General Surgery

## 2023-07-18 ENCOUNTER — Other Ambulatory Visit: Payer: Self-pay

## 2023-07-18 VITALS — BP 126/75 | HR 60 | Temp 98.4°F | Resp 18 | Ht 66.0 in | Wt 128.7 lb

## 2023-07-18 DIAGNOSIS — Z951 Presence of aortocoronary bypass graft: Secondary | ICD-10-CM | POA: Diagnosis not present

## 2023-07-18 DIAGNOSIS — I129 Hypertensive chronic kidney disease with stage 1 through stage 4 chronic kidney disease, or unspecified chronic kidney disease: Secondary | ICD-10-CM | POA: Insufficient documentation

## 2023-07-18 DIAGNOSIS — I2510 Atherosclerotic heart disease of native coronary artery without angina pectoris: Secondary | ICD-10-CM | POA: Diagnosis not present

## 2023-07-18 DIAGNOSIS — Z87891 Personal history of nicotine dependence: Secondary | ICD-10-CM | POA: Diagnosis not present

## 2023-07-18 DIAGNOSIS — E785 Hyperlipidemia, unspecified: Secondary | ICD-10-CM | POA: Diagnosis not present

## 2023-07-18 DIAGNOSIS — D696 Thrombocytopenia, unspecified: Secondary | ICD-10-CM | POA: Diagnosis not present

## 2023-07-18 DIAGNOSIS — I480 Paroxysmal atrial fibrillation: Secondary | ICD-10-CM | POA: Insufficient documentation

## 2023-07-18 DIAGNOSIS — I252 Old myocardial infarction: Secondary | ICD-10-CM | POA: Insufficient documentation

## 2023-07-18 DIAGNOSIS — Z7901 Long term (current) use of anticoagulants: Secondary | ICD-10-CM | POA: Insufficient documentation

## 2023-07-18 DIAGNOSIS — N183 Chronic kidney disease, stage 3 unspecified: Secondary | ICD-10-CM | POA: Insufficient documentation

## 2023-07-18 DIAGNOSIS — J449 Chronic obstructive pulmonary disease, unspecified: Secondary | ICD-10-CM | POA: Insufficient documentation

## 2023-07-18 DIAGNOSIS — Z8673 Personal history of transient ischemic attack (TIA), and cerebral infarction without residual deficits: Secondary | ICD-10-CM | POA: Insufficient documentation

## 2023-07-18 DIAGNOSIS — Z01812 Encounter for preprocedural laboratory examination: Secondary | ICD-10-CM | POA: Diagnosis not present

## 2023-07-18 DIAGNOSIS — Z01818 Encounter for other preprocedural examination: Secondary | ICD-10-CM

## 2023-07-18 HISTORY — DX: Personal history of urinary calculi: Z87.442

## 2023-07-18 HISTORY — DX: Insomnia, unspecified: G47.00

## 2023-07-18 HISTORY — DX: Anemia, unspecified: D64.9

## 2023-07-18 LAB — CBC
HCT: 44.8 % (ref 39.0–52.0)
Hemoglobin: 14.6 g/dL (ref 13.0–17.0)
MCH: 29.8 pg (ref 26.0–34.0)
MCHC: 32.6 g/dL (ref 30.0–36.0)
MCV: 91.4 fL (ref 80.0–100.0)
Platelets: 124 10*3/uL — ABNORMAL LOW (ref 150–400)
RBC: 4.9 MIL/uL (ref 4.22–5.81)
RDW: 14.6 % (ref 11.5–15.5)
WBC: 5.9 10*3/uL (ref 4.0–10.5)
nRBC: 0 % (ref 0.0–0.2)

## 2023-07-18 LAB — BASIC METABOLIC PANEL
Anion gap: 10 (ref 5–15)
BUN: 11 mg/dL (ref 8–23)
CO2: 25 mmol/L (ref 22–32)
Calcium: 9.3 mg/dL (ref 8.9–10.3)
Chloride: 104 mmol/L (ref 98–111)
Creatinine, Ser: 1.37 mg/dL — ABNORMAL HIGH (ref 0.61–1.24)
GFR, Estimated: 54 mL/min — ABNORMAL LOW (ref >60)
Glucose, Bld: 84 mg/dL (ref 70–99)
Potassium: 4.1 mmol/L (ref 3.5–5.1)
Sodium: 139 mmol/L (ref 135–145)

## 2023-07-18 NOTE — Progress Notes (Signed)
/  CP -  Saralyn Pilar, [place] Cardiologist - Dr Jodelle Red (clearance in [facility] 05/29/23) EP - Dr Lewayne Bunting  Chest x-ray - 06/06/23 EKG - 10/17/22 Stress Test - yrs ago, unknown location ECHO - 08/24/22 Cardiac Cath - 08/05/20 CE  Medtronic ICD  - Last remote device check was on 06/11/2023.  Rep Shari Prows was notified of surgery date and time by Misty Stanley, RN in PAT on 07/17/23.  Buffy at the OR desk was notified.   Sleep Study -  n/a  Diabetes - n/a  Blood Thinner Instructions:  Hold Eliquis 2 days prior to procedure per MD.  Last dose will be on 07/23/23.  Aspirin Instructions: n/a  ERAS - PRE-SURGERY Ensure or G2-   Anesthesia review: Yes  STOP now taking any Aspirin (unless otherwise instructed by your surgeon), Aleve, Naproxen, Ibuprofen, Motrin, Advil, Goody's, BC's, all herbal medications, fish oil, and all vitamins.   Coronavirus Screening Do you have any of the following symptoms:  Cough yes/no: No Fever (>100.71F)  yes/no: No Runny nose yes/no: No Sore throat yes/no: No Difficulty breathing/shortness of breath  yes/no: No  Have you traveled in the last 14 days and where? yes/no: No  Patient verbalized understanding of instructions that were given to them at the PAT appointment. Patient was also instructed that they will need to review over the PAT instructions again at home before surgery.

## 2023-07-18 NOTE — Telephone Encounter (Signed)
 I would suggest a trial of omeprazole 20 mg daily.  The hope is to keep you on the lowest dose needed to control your symptoms.  He is taking the correct dose of metoprolol succinate.  He should now be taking only 1 tablet instead of 1-1/2.

## 2023-07-18 NOTE — Telephone Encounter (Signed)
 Pt informed of below. Will call back if the 20 does not control symptoms

## 2023-07-19 NOTE — Anesthesia Preprocedure Evaluation (Addendum)
 Anesthesia Evaluation  Patient identified by MRN, date of birth, ID band Patient awake    Reviewed: Allergy & Precautions, NPO status , Patient's Chart, lab work & pertinent test results, reviewed documented beta blocker date and time   Airway Mallampati: I  TM Distance: >3 FB Neck ROM: Full    Dental no notable dental hx. (+) Teeth Intact, Dental Advisory Given   Pulmonary COPD,  COPD inhaler, Patient abstained from smoking., former smoker   Pulmonary exam normal breath sounds clear to auscultation       Cardiovascular hypertension, Pt. on home beta blockers and Pt. on medications + angina  + CAD, + Past MI, + CABG, + Peripheral Vascular Disease and +CHF  Normal cardiovascular exam+ dysrhythmias Atrial Fibrillation + Cardiac Defibrillator  Rhythm:Regular Rate:Normal     Neuro/Psych CVA, No Residual Symptoms  negative psych ROS   GI/Hepatic ,GERD  ,,(+)     substance abuse (butrans patch, percocet)    Endo/Other  negative endocrine ROS    Renal/GU Renal InsufficiencyRenal disease  negative genitourinary   Musculoskeletal  (+) Arthritis ,    Abdominal   Peds  Hematology  (+) Blood dyscrasia (eliquis)   Anesthesia Other Findings   Reproductive/Obstetrics                             Anesthesia Physical Anesthesia Plan  ASA: 3  Anesthesia Plan: General   Post-op Pain Management: Tylenol PO (pre-op)*, Ketamine IV* and Dilaudid IV   Induction: Intravenous  PONV Risk Score and Plan: 2 and Dexamethasone, Ondansetron, Treatment may vary due to age or medical condition and Midazolam  Airway Management Planned: Oral ETT  Additional Equipment:   Intra-op Plan:   Post-operative Plan: Extubation in OR  Informed Consent: I have reviewed the patients History and Physical, chart, labs and discussed the procedure including the risks, benefits and alternatives for the proposed anesthesia with  the patient or authorized representative who has indicated his/her understanding and acceptance.     Dental advisory given  Plan Discussed with: CRNA  Anesthesia Plan Comments: (PAT note by Antionette Poles, PA-C:  73 year old male follows with cardiology for history of hypertension, hyperlipidemia, CAD s/p MI with three-vessel CABG in 2008, ICM s/p ICD with subsequent recovery of EF to low normal, CVA, PAF on Eliquis, PVC, aortic dilation, RAS s/p right renal artery stent. Echo 08/2022 low normal LVEF 50 to 55%, no RWMA, mild LVH, indeterminate diastolic parameters, RV mildly enlarged, bilateral atria severely dilated, mild MR, mild dilation of aortic root 42 mm.  Cardiac clearance per telephone encounter 06/03/2023 by Joni Reining, NP, "Chart reviewed as part of pre-operative protocol coverage. Given past medical history and time since last visit, based on ACC/AHA guidelines, Andrew Townsend is at acceptable risk for the planned procedure without further cardiovascular testing. Patient with diagnosis of afib on Eliquis for anticoagulation.Marland KitchenMarland KitchenPer office protocol, patient can hold Eliquis for 2 days prior to procedure."    Other pertinent history includes former smoker with associated COPD (maintained on Trelegy and as needed albuterol), GERD, CKDIII, neuropathy, BPH,  Preop labs reviewed, creatinine mildly elevated 1.37 consistent with history of CKD, mild thrombocytopenia platelets 124, otherwise unremarkable.  EKG 10/17/2022: Sinus bradycardia with intermittent V paced beats.  Perioperative prescription for implanted cardiac device programming per progress note 05/30/2023: Device Information:  Clinic EP Physician:  Lewayne Bunting, MD   Device Type:  Defibrillator Manufacturer and Phone #:  Medtronic: [phone] Pacemaker Dependent?:  No. Date of Last Device Check:  03/13/2023     Normal Device Function?:  Yes.    Electrophysiologist's Recommendations:   Have magnet available.  Provide  continuous ECG monitoring when magnet is used or reprogramming is to be performed.   Procedure may interfere with device function.  Magnet should be placed over device during procedure.  TTE 08/24/2022: 1. Left ventricular ejection fraction, by estimation, is 50 to 55%. Left  ventricular ejection fraction by 3D volume is 52 %. The left ventricle has  low normal function. The left ventricle has no regional wall motion  abnormalities. There is mild left  ventricular hypertrophy of the basal-septal segment. Left ventricular  diastolic parameters are indeterminate.  2. Right ventricular systolic function is normal. The right ventricular  size is mildly enlarged.  3. Left atrial size was severely dilated.  4. Right atrial size was severely dilated.  5. The mitral valve is normal in structure. Mild mitral valve  regurgitation. No evidence of mitral stenosis.  6. The aortic valve is normal in structure. Aortic valve regurgitation is  not visualized. No aortic stenosis is present.  7. Aortic dilatation noted. There is mild dilatation of the aortic root,  measuring 42 mm.  8. The inferior vena cava is normal in size with greater than 50%  respiratory variability, suggesting right atrial pressure of 3 mmHg.   Cath 08/05/2020 (Care Everywhere): Findings:  --LM: Distal LM has a 95% stenosis  --LAD: The proximal LAD has diffuse moderate-severe disease. The mid LAD  is 100% occluded. The LIMA-LAD is patent. The distal LAD has mild diffuse  disease. The SVG-diagonal is known to be occluded and was not injected.  --LCx: The proximal LCx is 100% occluded. The SVG-OM is widely patent. The  distal OM territory it serves has only minimal luminal disease.  --RCA: Not engaged. Known to be 100% occluded in the proximal segment. The  SVG-PDA is patent but has TIMI II flow. There are R-L collaterals.   Intervention: none   Comments:  After informed consent was obtained, the patient was brought to  the cath  lab in a fasting state and prepped and draped in a sterile fashion. Access  to the right femoral artery was obtained via micropuncture technique under  ultrasound and fluoroscopic guidance. Diagnostic coronary angiography was  obtained of the left coronary artery with a 87F JL3.5 catheter. Unable to  engage the SVG-RCA with the JR4 diagnostic catheter. This was exchanged  for a 87F IM catheter and angiography of the LIMA-LAD was obtained. Next a  87F multipurpose catheter was used to selectively engage the SVG-RCA. The  native RCA and SVG-diagonal were not selectively engaged as they are known  to both be 100% occluded and he has underlying CKD.   Recommendations:  Native disease may have progressed somewhat but 3/4 grafts are still  patent with good distal flow. No indication for PCI prior to elective  non-cardiac surgery. It is reasonable to proceed, continuing aspirin 81 mg  and beta blocker uninterrupted.   )        Anesthesia Quick Evaluation

## 2023-07-19 NOTE — Progress Notes (Signed)
 Anesthesia Chart Review:  73 year old male follows with cardiology for history of hypertension, hyperlipidemia, CAD s/p MI with three-vessel CABG in 2008, ICM s/p ICD with subsequent recovery of EF to low normal, CVA, PAF on Eliquis, PVC, aortic dilation, RAS s/p right renal artery stent. Echo 08/2022 low normal LVEF 50 to 55%, no RWMA, mild LVH, indeterminate diastolic parameters, RV mildly enlarged, bilateral atria severely dilated, mild MR, mild dilation of aortic root 42 mm.  Cardiac clearance per telephone encounter 06/03/2023 by Joni Reining, NP, "Chart reviewed as part of pre-operative protocol coverage. Given past medical history and time since last visit, based on ACC/AHA guidelines, Baden Betsch is at acceptable risk for the planned procedure without further cardiovascular testing. Patient with diagnosis of afib on Eliquis for anticoagulation.Marland KitchenMarland KitchenPer office protocol, patient can hold Eliquis for 2 days prior to procedure."    Other pertinent history includes former smoker with associated COPD (maintained on Trelegy and as needed albuterol), GERD, CKDIII, neuropathy, BPH,  Preop labs reviewed, creatinine mildly elevated 1.37 consistent with history of CKD, mild thrombocytopenia platelets 124, otherwise unremarkable.  EKG 10/17/2022: Sinus bradycardia with intermittent V paced beats.  Perioperative prescription for implanted cardiac device programming per progress note 05/30/2023: Device Information:   Clinic EP Physician:  Lewayne Bunting, MD    Device Type:  Defibrillator Manufacturer and Phone #:  Medtronic: [phone] Pacemaker Dependent?:  No. Date of Last Device Check:  03/13/2023     Normal Device Function?:  Yes.     Electrophysiologist's Recommendations:   Have magnet available. Provide continuous ECG monitoring when magnet is used or reprogramming is to be performed.  Procedure may interfere with device function.  Magnet should be placed over device during procedure.  TTE  08/24/2022: 1. Left ventricular ejection fraction, by estimation, is 50 to 55%. Left  ventricular ejection fraction by 3D volume is 52 %. The left ventricle has  low normal function. The left ventricle has no regional wall motion  abnormalities. There is mild left  ventricular hypertrophy of the basal-septal segment. Left ventricular  diastolic parameters are indeterminate.   2. Right ventricular systolic function is normal. The right ventricular  size is mildly enlarged.   3. Left atrial size was severely dilated.   4. Right atrial size was severely dilated.   5. The mitral valve is normal in structure. Mild mitral valve  regurgitation. No evidence of mitral stenosis.   6. The aortic valve is normal in structure. Aortic valve regurgitation is  not visualized. No aortic stenosis is present.   7. Aortic dilatation noted. There is mild dilatation of the aortic root,  measuring 42 mm.   8. The inferior vena cava is normal in size with greater than 50%  respiratory variability, suggesting right atrial pressure of 3 mmHg.   Cath 08/05/2020 (Care Everywhere): Findings:  --LM: Distal LM has a 95% stenosis  --LAD: The proximal LAD has diffuse moderate-severe disease. The mid LAD  is 100% occluded. The LIMA-LAD is patent. The distal LAD has mild diffuse  disease. The SVG-diagonal is known to be occluded and was not injected.  --LCx: The proximal LCx is 100% occluded. The SVG-OM is widely patent. The  distal OM territory it serves has only minimal luminal disease.  --RCA: Not engaged. Known to be 100% occluded in the proximal segment. The  SVG-PDA is patent but has TIMI II flow. There are R-L collaterals.   Intervention: none   Comments:  After informed consent was obtained, the patient was brought to  the cath  lab in a fasting state and prepped and draped in a sterile fashion. Access  to the right femoral artery was obtained via micropuncture technique under  ultrasound and fluoroscopic  guidance. Diagnostic coronary angiography was  obtained of the left coronary artery with a 32F JL3.5 catheter. Unable to  engage the SVG-RCA with the JR4 diagnostic catheter. This was exchanged  for a 32F IM catheter and angiography of the LIMA-LAD was obtained. Next a  32F multipurpose catheter was used to selectively engage the SVG-RCA. The  native RCA and SVG-diagonal were not selectively engaged as they are known  to both be 100% occluded and he has underlying CKD.   Recommendations:  Native disease may have progressed somewhat but 3/4 grafts are still  patent with good distal flow. No indication for PCI prior to elective  non-cardiac surgery. It is reasonable to proceed, continuing aspirin 81 mg  and beta blocker uninterrupted.     Zannie Cove [facility] Short Stay Center/Anesthesiology Phone [phone] 07/19/2023 4:28 PM

## 2023-07-22 ENCOUNTER — Ambulatory Visit (INDEPENDENT_AMBULATORY_CARE_PROVIDER_SITE_OTHER): Admitting: Family Medicine

## 2023-07-22 ENCOUNTER — Encounter: Payer: Self-pay | Admitting: Family Medicine

## 2023-07-22 VITALS — BP 115/64 | HR 68 | Ht 66.0 in | Wt 125.4 lb

## 2023-07-22 DIAGNOSIS — U071 COVID-19: Secondary | ICD-10-CM

## 2023-07-22 LAB — POC SOFIA 2 FLU + SARS ANTIGEN FIA
Influenza A, POC: NEGATIVE
Influenza B, POC: NEGATIVE
SARS Coronavirus 2 Ag: POSITIVE — AB

## 2023-07-22 MED ORDER — OSELTAMIVIR PHOSPHATE 75 MG PO CAPS: 75.0000 mg | ORAL_CAPSULE | Freq: Two times a day (BID) | ORAL | 0 refills | Status: AC

## 2023-07-22 NOTE — Progress Notes (Signed)
   Acute Office Visit  Subjective:     Patient ID: Andrew Townsend, male    DOB: 1950-11-09, 73 y.o.   MRN: [id number]  Chief Complaint  Patient presents with   Cough    HPI  Patient states that for approximately the last 4 to 5 days he has had symptoms of cough with productive sputum, nasal congestion with mucus production, some sinus pressure and today had a "slight fever".  Typical temperature is 97.6 and today was 99.  No ear complaints.  Patient takes  Trelegy daily and albuterol as needed.  Has not needed to increase his inhaler.  Patient lives with his sister who tested positive for flu about a week ago.  Patient has an upcoming surgery Friday.  ROS      Objective:    BP 115/64   Pulse 68   Ht 5\' 6"  (1.676 m)   Wt 125 lb 6.4 oz (56.9 kg)   SpO2 96%   BMI 20.24 kg/m    Physical Exam General: Alert, oriented CV: Regular in rhythm Pulmonary: Lungs clear bilaterally no wheezes or crackles.  No results found for any visits on 07/22/23.      Assessment & Plan:   COVID Assessment & Plan: Patient has had symptoms for 5 days now (2/26).  Lives with sister who recently tested positive for flu.  His flu is negative but because of close contact with her and his upcoming surgery this Friday I prescribed prophylactic Tamiflu which is 75 mg twice daily this year due to the severity of the flu outbreak.  Discussed how Paxlovid loss not effective and the newer strains of COVID in reducing symptom duration or severity.  Nothing on exam to suggest COPD exacerbation.  Recommended symptomatic treatment.  Advised he call his surgeons office to let them know he tested positive for COVID and what day his symptoms started and that he is being prescribed Tamiflu for prophylactic purposes.  Orders: -     POC SOFIA 2 FLU + SARS ANTIGEN FIA  Other orders -     Oseltamivir Phosphate; Take 1 capsule (75 mg total) by mouth 2 (two) times daily for 5 days.  Dispense: 10 capsule; Refill:  0     Return in about 2 months (around 09/21/2023) for chronic issues, recent surgery.  Sandre Kitty, MD

## 2023-07-22 NOTE — Patient Instructions (Signed)
 It was nice to see you today,  We addressed the following topics today: -You tested positive for COVID.  There are no medications that are shown to be beneficial in the outpatient setting for the newest variants of COVID. - Treatment should remain over-the-counter symptomatic medications such as Afrin and nasal saline for nasal congestion, Cepacol lozenges for sore throat and guaifenesin and dextromethorphan for cough - I am still sending in Tamiflu as a prophylactic medication because you have exposure to someone who has flu. - You and your sister should try to isolate from each other at least until your surgery is done to avoid giving each other your respiratory infections.  Have a great day,  Frederic Jericho, MD

## 2023-07-22 NOTE — Assessment & Plan Note (Signed)
 Patient has had symptoms for 5 days now (2/26).  Lives with sister who recently tested positive for flu.  His flu is negative but because of close contact with her and his upcoming surgery this Friday I prescribed prophylactic Tamiflu which is 75 mg twice daily this year due to the severity of the flu outbreak.  Discussed how Paxlovid loss not effective and the newer strains of COVID in reducing symptom duration or severity.  Nothing on exam to suggest COPD exacerbation.  Recommended symptomatic treatment.  Advised he call his surgeons office to let them know he tested positive for COVID and what day his symptoms started and that he is being prescribed Tamiflu for prophylactic purposes.

## 2023-07-22 NOTE — Progress Notes (Signed)
 Phone call from [place] at [facility].  Patient tested positive for Covid today, they will reschedule his surgery.

## 2023-07-23 ENCOUNTER — Other Ambulatory Visit: Payer: Self-pay | Admitting: Family Medicine

## 2023-07-24 NOTE — Progress Notes (Signed)
 Remote ICD transmission.

## 2023-08-01 ENCOUNTER — Ambulatory Visit: Payer: Medicare PPO | Admitting: Urology

## 2023-08-01 DIAGNOSIS — N401 Enlarged prostate with lower urinary tract symptoms: Secondary | ICD-10-CM

## 2023-08-01 DIAGNOSIS — R351 Nocturia: Secondary | ICD-10-CM

## 2023-08-01 DIAGNOSIS — N138 Other obstructive and reflux uropathy: Secondary | ICD-10-CM | POA: Diagnosis not present

## 2023-08-01 DIAGNOSIS — N3281 Overactive bladder: Secondary | ICD-10-CM | POA: Diagnosis not present

## 2023-08-01 LAB — BLADDER SCAN AMB NON-IMAGING: Scan Result: 54

## 2023-08-01 MED ORDER — TAMSULOSIN HCL 0.4 MG PO CAPS: 0.4000 mg | ORAL_CAPSULE | Freq: Every day | ORAL | 11 refills | Status: DC

## 2023-08-01 NOTE — Progress Notes (Signed)
   08/01/2023 10:46 AM   Andrew Townsend 09/04/1950 [id number]  Reason for visit: Follow up BPH/OAB, nocturia  HPI: 73 year old male with a number of medical issues including hypertension, CAD, COPD, CKD, BPH.  Previously was on terazosin for BPH as well as oxybutynin 10 mg daily.  At 1 point he discontinued the terazosin and was doing well in the oxybutynin alone.  At our visit in March 2024 he opted to decrease the oxybutynin dose to 5 mg XL daily as he was having some sensation of incomplete emptying and difficulty voiding on the oxybutynin alone.  PVRs have always been normal, including normal again today at 54ml.  He is currently not on any prostate or bladder medications.  He drinks primarily tea during the day and in the evenings.  Primary complaint today is nocturia 2-4 x at night.  He has some urgency during the day but primary issue is the nocturia.  I recommended avoiding bladder irritants and minimizing fluids prior to bedtime.  I think it is reasonable to trial a course of Flomax to see if we can improve his nocturia.  Trial of Flomax, risk and benefits discussed RTC 6 weeks PVR and symptom check Could consider alternative OAB medication like Myrbetriq or Gemtesa in the future if no improvement on Flomax  Sondra Come, MD  [facility] [address] [place], [place] [address] [phone]

## 2023-08-01 NOTE — Patient Instructions (Signed)

## 2023-08-09 ENCOUNTER — Ambulatory Visit: Payer: Medicare PPO | Admitting: Family Medicine

## 2023-08-16 ENCOUNTER — Encounter: Payer: Self-pay | Admitting: Internal Medicine

## 2023-08-16 ENCOUNTER — Encounter (HOSPITAL_COMMUNITY): Payer: Self-pay | Admitting: General Surgery

## 2023-08-16 ENCOUNTER — Other Ambulatory Visit: Payer: Self-pay

## 2023-08-16 NOTE — Progress Notes (Signed)
 PCP - Saralyn Pilar, PA Cardiologist - Dr Jodelle Red (Clearance in [facility] 05/29/23)  Chest x-ray - 06/06/23 EKG - 10/17/22 Stress Test - years ago, unknown location ECHO - 08/24/22 Cardiac Cath - 08/05/20 CE  Medtronic ICD - Last remote check was on 06/11/23 (normal).  Rep Shari Prows was notified of surgery date and time via email.      Sleep Study -  n/a  Diabetes n/a  Blood Thinner Instructions:  Hold Eliquis 2 days prior to procedure per MD.  Last dose will be on 08/16/23.  Aspirin Instructions: n/a  ERAS - clear liquids til 12:15 PM DOS. PRE-SURGERY Ensure drink given at 07/18/23 PAT appt. with instructions (complete by 12:15 PM DOS). This will be the last thing that you will drink on the day of surgery.  Anesthesia review: Yes  STOP now taking any Aspirin (unless otherwise instructed by your surgeon), Aleve, Naproxen, Ibuprofen, Motrin, Advil, Goody's, BC's, all herbal medications, fish oil, and all vitamins.   Coronavirus Screening Do you have any of the following symptoms:  Cough yes/no: No Fever (>100.39F)  yes/no: No Runny nose yes/no: No Sore throat yes/no: No Difficulty breathing/shortness of breath  yes/no: No  Have you traveled in the last 14 days and where? yes/no: No  Patient verbalized understanding of instructions that were given via phone.

## 2023-08-16 NOTE — Progress Notes (Signed)
 PERIOPERATIVE PRESCRIPTION FOR IMPLANTED CARDIAC DEVICE PROGRAMMING  Patient Information: Name:  Andrew Townsend  DOB:  March 02, 1951  MRN:  [id number]  Planned Procedure:  Open bilateral inguinal hernia repair with mesh  Surgeon:  Dr Derrell Lolling  Date of Procedure:  08/19/23  Cautery will be used.  Position during surgery:  Supine   Please send documentation back to:  [facility] (Fax # [phone])    Device Information:  Clinic EP Physician:  Lewayne Bunting, MD   Device Type:  Defibrillator Manufacturer and Phone #:  Medtronic: [id number] Pacemaker Dependent?:  No. Date of Last Device Check:  7.23.24 Normal Device Function?:  Yes.    Electrophysiologist's Recommendations:  Have magnet available. Provide continuous ECG monitoring when magnet is used or reprogramming is to be performed.  Procedure may interfere with device function.  Magnet should be placed over device during procedure.  Per Device Clinic Standing Orders, Skip Mayer, RN  1:30 PM 08/16/2023

## 2023-08-19 ENCOUNTER — Encounter (HOSPITAL_COMMUNITY): Payer: Self-pay | Admitting: General Surgery

## 2023-08-19 ENCOUNTER — Ambulatory Visit (HOSPITAL_BASED_OUTPATIENT_CLINIC_OR_DEPARTMENT_OTHER): Payer: Self-pay | Admitting: Physician Assistant

## 2023-08-19 ENCOUNTER — Encounter (HOSPITAL_COMMUNITY)
Admission: RE | Disposition: A | Discharge status: Home / Self Care | Payer: Self-pay | Source: Home / Self Care | Attending: General Surgery

## 2023-08-19 ENCOUNTER — Other Ambulatory Visit: Payer: Self-pay

## 2023-08-19 ENCOUNTER — Ambulatory Visit (HOSPITAL_COMMUNITY): Payer: Self-pay | Admitting: Physician Assistant

## 2023-08-19 ENCOUNTER — Ambulatory Visit (HOSPITAL_COMMUNITY)
Admission: RE | Admit: 2023-08-19 | Discharge: 2023-08-19 | Disposition: A | Discharge status: Home / Self Care | Payer: Medicare PPO | Attending: General Surgery | Admitting: General Surgery

## 2023-08-19 DIAGNOSIS — K219 Gastro-esophageal reflux disease without esophagitis: Secondary | ICD-10-CM | POA: Diagnosis not present

## 2023-08-19 DIAGNOSIS — Z7901 Long term (current) use of anticoagulants: Secondary | ICD-10-CM | POA: Insufficient documentation

## 2023-08-19 DIAGNOSIS — I4891 Unspecified atrial fibrillation: Secondary | ICD-10-CM | POA: Diagnosis not present

## 2023-08-19 DIAGNOSIS — I509 Heart failure, unspecified: Secondary | ICD-10-CM | POA: Insufficient documentation

## 2023-08-19 DIAGNOSIS — I2510 Atherosclerotic heart disease of native coronary artery without angina pectoris: Secondary | ICD-10-CM | POA: Diagnosis not present

## 2023-08-19 DIAGNOSIS — I110 Hypertensive heart disease with heart failure: Secondary | ICD-10-CM | POA: Diagnosis not present

## 2023-08-19 DIAGNOSIS — J449 Chronic obstructive pulmonary disease, unspecified: Secondary | ICD-10-CM | POA: Diagnosis not present

## 2023-08-19 DIAGNOSIS — I130 Hypertensive heart and chronic kidney disease with heart failure and stage 1 through stage 4 chronic kidney disease, or unspecified chronic kidney disease: Secondary | ICD-10-CM | POA: Diagnosis not present

## 2023-08-19 DIAGNOSIS — Z87891 Personal history of nicotine dependence: Secondary | ICD-10-CM | POA: Diagnosis not present

## 2023-08-19 DIAGNOSIS — I10 Essential (primary) hypertension: Secondary | ICD-10-CM

## 2023-08-19 DIAGNOSIS — K4021 Bilateral inguinal hernia, without obstruction or gangrene, recurrent: Secondary | ICD-10-CM | POA: Insufficient documentation

## 2023-08-19 DIAGNOSIS — I25119 Atherosclerotic heart disease of native coronary artery with unspecified angina pectoris: Secondary | ICD-10-CM | POA: Diagnosis not present

## 2023-08-19 DIAGNOSIS — Z951 Presence of aortocoronary bypass graft: Secondary | ICD-10-CM | POA: Diagnosis not present

## 2023-08-19 DIAGNOSIS — I252 Old myocardial infarction: Secondary | ICD-10-CM | POA: Diagnosis not present

## 2023-08-19 DIAGNOSIS — F17290 Nicotine dependence, other tobacco product, uncomplicated: Secondary | ICD-10-CM | POA: Insufficient documentation

## 2023-08-19 DIAGNOSIS — Z95810 Presence of automatic (implantable) cardiac defibrillator: Secondary | ICD-10-CM | POA: Diagnosis not present

## 2023-08-19 DIAGNOSIS — N1832 Chronic kidney disease, stage 3b: Secondary | ICD-10-CM | POA: Diagnosis not present

## 2023-08-19 DIAGNOSIS — I5022 Chronic systolic (congestive) heart failure: Secondary | ICD-10-CM | POA: Diagnosis not present

## 2023-08-19 HISTORY — DX: Presence of automatic (implantable) cardiac defibrillator: Z95.810

## 2023-08-19 HISTORY — PX: INGUINAL HERNIA REPAIR: SHX194

## 2023-08-19 LAB — CBC
HCT: 45.6 % (ref 39.0–52.0)
Hemoglobin: 14.3 g/dL (ref 13.0–17.0)
MCH: 29 pg (ref 26.0–34.0)
MCHC: 31.4 g/dL (ref 30.0–36.0)
MCV: 92.5 fL (ref 80.0–100.0)
Platelets: 171 10*3/uL (ref 150–400)
RBC: 4.93 MIL/uL (ref 4.22–5.81)
RDW: 15.2 % (ref 11.5–15.5)
WBC: 9.8 10*3/uL (ref 4.0–10.5)
nRBC: 0 % (ref 0.0–0.2)

## 2023-08-19 LAB — GLUCOSE, CAPILLARY: Glucose-Capillary: 79 mg/dL (ref 70–99)

## 2023-08-19 LAB — BASIC METABOLIC PANEL WITH GFR
Anion gap: 8 (ref 5–15)
BUN: 13 mg/dL (ref 8–23)
CO2: 26 mmol/L (ref 22–32)
Calcium: 9.2 mg/dL (ref 8.9–10.3)
Chloride: 106 mmol/L (ref 98–111)
Creatinine, Ser: 1.25 mg/dL — ABNORMAL HIGH (ref 0.61–1.24)
GFR, Estimated: >60 mL/min (ref >60)
Glucose, Bld: 38 mg/dL — CL (ref 70–99)
Potassium: 3.7 mmol/L (ref 3.5–5.1)
Sodium: 140 mmol/L (ref 135–145)

## 2023-08-19 SURGERY — REPAIR, HERNIA, INGUINAL, ADULT
Anesthesia: General | Laterality: Bilateral

## 2023-08-19 MED ORDER — BUPIVACAINE-EPINEPHRINE (PF) 0.25% -1:200000 IJ SOLN
INTRAMUSCULAR | Status: DC | PRN
  Administered 2023-08-19 (×2): 20 mL via SURGICAL_CAVITY

## 2023-08-19 MED ORDER — KETAMINE HCL 50 MG/5ML IJ SOSY
PREFILLED_SYRINGE | INTRAMUSCULAR | Status: DC | PRN
  Administered 2023-08-19: 20 mg via INTRAVENOUS

## 2023-08-19 MED ORDER — FENTANYL CITRATE (PF) 100 MCG/2ML IJ SOLN
25.0000 ug | INTRAMUSCULAR | Status: DC | PRN
  Administered 2023-08-19 (×2): 25 ug via INTRAVENOUS
  Administered 2023-08-19: 50 ug via INTRAVENOUS

## 2023-08-19 MED ORDER — LACTATED RINGERS IV SOLN: INTRAVENOUS | Status: DC

## 2023-08-19 MED ORDER — SUGAMMADEX SODIUM 200 MG/2ML IV SOLN
INTRAVENOUS | Status: DC | PRN
  Administered 2023-08-19: 150 mg via INTRAVENOUS

## 2023-08-19 MED ORDER — EPHEDRINE SULFATE-NACL 50-0.9 MG/10ML-% IV SOSY
PREFILLED_SYRINGE | INTRAVENOUS | Status: DC | PRN
  Administered 2023-08-19 (×4): 5 mg via INTRAVENOUS

## 2023-08-19 MED ORDER — DEXAMETHASONE SODIUM PHOSPHATE 10 MG/ML IJ SOLN
INTRAMUSCULAR | Status: DC | PRN
  Administered 2023-08-19: 10 mg via INTRAVENOUS

## 2023-08-19 MED ORDER — FENTANYL CITRATE (PF) 250 MCG/5ML IJ SOLN
INTRAMUSCULAR | Status: DC | PRN
  Administered 2023-08-19 (×2): 50 ug via INTRAVENOUS

## 2023-08-19 MED ORDER — TRAMADOL HCL 50 MG PO TABS: 50.0000 mg | ORAL_TABLET | Freq: Four times a day (QID) | ORAL | 0 refills | Status: DC | PRN

## 2023-08-19 MED ORDER — LIDOCAINE 2% (20 MG/ML) 5 ML SYRINGE
INTRAMUSCULAR | Status: DC | PRN
  Administered 2023-08-19: 40 mg via INTRAVENOUS

## 2023-08-19 MED ORDER — PHENYLEPHRINE 80 MCG/ML (10ML) SYRINGE FOR IV PUSH (FOR BLOOD PRESSURE SUPPORT)
PREFILLED_SYRINGE | INTRAVENOUS | Status: DC | PRN
  Administered 2023-08-19 (×2): 80 ug via INTRAVENOUS

## 2023-08-19 MED ORDER — CEFAZOLIN SODIUM-DEXTROSE 2-4 GM/100ML-% IV SOLN
2.0000 g | INTRAVENOUS | Status: AC
  Administered 2023-08-19: 2 g via INTRAVENOUS
  Filled 2023-08-19: qty 100

## 2023-08-19 MED ORDER — PROPOFOL 10 MG/ML IV BOLUS
INTRAVENOUS | Status: DC | PRN
  Administered 2023-08-19: 90 mg via INTRAVENOUS

## 2023-08-19 MED ORDER — CHLORHEXIDINE GLUCONATE 0.12 % MT SOLN
15.0000 mL | Freq: Once | OROMUCOSAL | Status: AC
  Administered 2023-08-19: 15 mL via OROMUCOSAL
  Filled 2023-08-19: qty 15

## 2023-08-19 MED ORDER — ROCURONIUM BROMIDE 10 MG/ML (PF) SYRINGE
PREFILLED_SYRINGE | INTRAVENOUS | Status: AC
  Filled 2023-08-19: qty 10

## 2023-08-19 MED ORDER — CHLORHEXIDINE GLUCONATE CLOTH 2 % EX PADS: 6.0000 | MEDICATED_PAD | Freq: Once | CUTANEOUS | Status: DC

## 2023-08-19 MED ORDER — OXYCODONE HCL 5 MG PO TABS: 5.0000 mg | ORAL_TABLET | Freq: Once | ORAL | Status: DC | PRN

## 2023-08-19 MED ORDER — BUPIVACAINE LIPOSOME 1.3 % IJ SUSP
INTRAMUSCULAR | Status: AC
  Filled 2023-08-19: qty 20

## 2023-08-19 MED ORDER — ORAL CARE MOUTH RINSE: 15.0000 mL | Freq: Once | OROMUCOSAL | Status: AC

## 2023-08-19 MED ORDER — PROPOFOL 10 MG/ML IV BOLUS
INTRAVENOUS | Status: AC
  Filled 2023-08-19: qty 20

## 2023-08-19 MED ORDER — OXYCODONE HCL 5 MG/5ML PO SOLN: 5.0000 mg | Freq: Once | ORAL | Status: DC | PRN

## 2023-08-19 MED ORDER — ACETAMINOPHEN 500 MG PO TABS: 1000.0000 mg | ORAL_TABLET | ORAL | Status: DC

## 2023-08-19 MED ORDER — LIDOCAINE 2% (20 MG/ML) 5 ML SYRINGE
INTRAMUSCULAR | Status: AC
  Filled 2023-08-19: qty 5

## 2023-08-19 MED ORDER — ROCURONIUM BROMIDE 10 MG/ML (PF) SYRINGE
PREFILLED_SYRINGE | INTRAVENOUS | Status: DC | PRN
  Administered 2023-08-19: 100 mg via INTRAVENOUS

## 2023-08-19 MED ORDER — FENTANYL CITRATE (PF) 250 MCG/5ML IJ SOLN
INTRAMUSCULAR | Status: AC
  Filled 2023-08-19: qty 5

## 2023-08-19 MED ORDER — DEXAMETHASONE SODIUM PHOSPHATE 10 MG/ML IJ SOLN
INTRAMUSCULAR | Status: AC
  Filled 2023-08-19: qty 1

## 2023-08-19 MED ORDER — FENTANYL CITRATE (PF) 100 MCG/2ML IJ SOLN
INTRAMUSCULAR | Status: AC
  Filled 2023-08-19: qty 2

## 2023-08-19 MED ORDER — ACETAMINOPHEN 325 MG PO TABS
650.0000 mg | ORAL_TABLET | Freq: Once | ORAL | Status: AC
  Administered 2023-08-19: 650 mg via ORAL
  Filled 2023-08-19: qty 2

## 2023-08-19 MED ORDER — ACETAMINOPHEN 500 MG PO TABS
1000.0000 mg | ORAL_TABLET | Freq: Once | ORAL | Status: DC
  Filled 2023-08-19: qty 2

## 2023-08-19 MED ORDER — ONDANSETRON HCL 4 MG/2ML IJ SOLN
INTRAMUSCULAR | Status: DC | PRN
  Administered 2023-08-19: 4 mg via INTRAVENOUS

## 2023-08-19 MED ORDER — ONDANSETRON HCL 4 MG/2ML IJ SOLN
INTRAMUSCULAR | Status: AC
  Filled 2023-08-19: qty 2

## 2023-08-19 MED ORDER — ENSURE PRE-SURGERY PO LIQD
296.0000 mL | Freq: Once | ORAL | Status: DC
Start: 2023-08-20 — End: 2023-08-19

## 2023-08-19 MED ORDER — KETAMINE HCL 50 MG/5ML IJ SOSY
PREFILLED_SYRINGE | INTRAMUSCULAR | Status: AC
  Filled 2023-08-19: qty 5

## 2023-08-19 MED ORDER — MIDAZOLAM HCL 2 MG/2ML IJ SOLN
INTRAMUSCULAR | Status: AC
  Filled 2023-08-19: qty 2

## 2023-08-19 MED ORDER — AMISULPRIDE (ANTIEMETIC) 5 MG/2ML IV SOLN: 10.0000 mg | Freq: Once | INTRAVENOUS | Status: DC | PRN

## 2023-08-19 MED ORDER — MIDAZOLAM HCL 2 MG/2ML IJ SOLN
INTRAMUSCULAR | Status: DC | PRN
  Administered 2023-08-19: 2 mg via INTRAVENOUS

## 2023-08-19 MED ORDER — BUPIVACAINE-EPINEPHRINE 0.25% -1:200000 IJ SOLN
INTRAMUSCULAR | Status: DC | PRN
  Administered 2023-08-19: 10 mL

## 2023-08-19 SURGICAL SUPPLY — 37 items
BAG COUNTER SPONGE SURGICOUNT (BAG) ×1 IMPLANT
BLADE CLIPPER SURG (BLADE) IMPLANT
CANISTER SUCT 3000ML PPV (MISCELLANEOUS) IMPLANT
CHLORAPREP W/TINT 26 (MISCELLANEOUS) ×1 IMPLANT
COVER SURGICAL LIGHT HANDLE (MISCELLANEOUS) ×1 IMPLANT
DERMABOND ADVANCED .7 DNX12 (GAUZE/BANDAGES/DRESSINGS) ×1 IMPLANT
DRAIN PENROSE .5X12 LATEX STL (DRAIN) IMPLANT
DRAPE LAPAROSCOPIC ABDOMINAL (DRAPES) ×1 IMPLANT
ELECT REM PT RETURN 9FT ADLT (ELECTROSURGICAL) ×1 IMPLANT
ELECTRODE REM PT RTRN 9FT ADLT (ELECTROSURGICAL) ×1 IMPLANT
GAUZE 4X4 16PLY ~~LOC~~+RFID DBL (SPONGE) ×1 IMPLANT
GLOVE BIO SURGEON STRL SZ7.5 (GLOVE) ×2 IMPLANT
GLOVE BIOGEL PI IND STRL 8 (GLOVE) ×1 IMPLANT
GOWN STRL REUS W/ TWL LRG LVL3 (GOWN DISPOSABLE) ×1 IMPLANT
GOWN STRL REUS W/ TWL XL LVL3 (GOWN DISPOSABLE) ×1 IMPLANT
KIT BASIN OR (CUSTOM PROCEDURE TRAY) ×1 IMPLANT
KIT TURNOVER KIT B (KITS) ×1 IMPLANT
MESH PARIETEX PROGRIP LEFT (Mesh General) IMPLANT
MESH PARIETEX PROGRIP RIGHT (Mesh General) IMPLANT
NDL HYPO 25GX1X1/2 BEV (NEEDLE) ×1 IMPLANT
NEEDLE HYPO 25GX1X1/2 BEV (NEEDLE) ×1 IMPLANT
NS IRRIG 1000ML POUR BTL (IV SOLUTION) ×1 IMPLANT
PACK GENERAL/GYN (CUSTOM PROCEDURE TRAY) ×1 IMPLANT
PAD ARMBOARD POSITIONER FOAM (MISCELLANEOUS) ×2 IMPLANT
PENCIL SMOKE EVACUATOR (MISCELLANEOUS) ×1 IMPLANT
SPONGE INTESTINAL PEANUT (DISPOSABLE) IMPLANT
SUT MNCRL AB 4-0 PS2 18 (SUTURE) ×1 IMPLANT
SUT PROLENE 2 0 SH DA (SUTURE) ×1 IMPLANT
SUT SILK 0 TIES 10X30 (SUTURE) ×1 IMPLANT
SUT VIC AB 2-0 SH 27X BRD (SUTURE) ×1 IMPLANT
SUT VIC AB 3-0 SH 27XBRD (SUTURE) ×1 IMPLANT
SUT VICRYL AB 2 0 TIES (SUTURE) ×1 IMPLANT
SYR CONTROL 10ML LL (SYRINGE) ×1 IMPLANT
SYRINGE TOOMEY DISP (SYRINGE) ×1 IMPLANT
TOWEL GREEN STERILE (TOWEL DISPOSABLE) ×1 IMPLANT
TOWEL GREEN STERILE FF (TOWEL DISPOSABLE) ×1 IMPLANT
TRAY FOL W/BAG SLVR 16FR STRL (SET/KITS/TRAYS/PACK) IMPLANT

## 2023-08-19 NOTE — Anesthesia Procedure Notes (Signed)
 Procedure Name: Intubation Date/Time: 08/19/2023 1:08 PM  Performed by: Shary Decamp, CRNAPre-anesthesia Checklist: Patient identified, Patient being monitored, Timeout performed, Emergency Drugs available and Suction available Patient Re-evaluated:Patient Re-evaluated prior to induction Oxygen Delivery Method: Circle System Utilized Preoxygenation: Pre-oxygenation with 100% oxygen Induction Type: IV induction Ventilation: Mask ventilation without difficulty Laryngoscope Size: Miller and 2 Grade View: Grade I Tube type: Oral Tube size: 7.5 mm Number of attempts: 1 Airway Equipment and Method: Stylet Placement Confirmation: ETT inserted through vocal cords under direct vision, positive ETCO2 and breath sounds checked- equal and bilateral Secured at: 22 cm Tube secured with: Tape Dental Injury: Teeth and Oropharynx as per pre-operative assessment

## 2023-08-19 NOTE — Progress Notes (Signed)
 Lab called to notify that blood sugar on BMP was 38. Pt not diabetic. CBG checked- 79.

## 2023-08-19 NOTE — Transfer of Care (Signed)
 Immediate Anesthesia Transfer of Care Note  Patient: Andrew Townsend  Procedure(s) Performed: OPEN BILATERAL INGUINAL HERNIA REPAIR WITH MESH (Bilateral)  Patient Location: PACU  Anesthesia Type:General  Level of Consciousness: awake, alert , patient cooperative, and responds to stimulation  Airway & Oxygen Therapy: Patient Spontanous Breathing and Patient connected to face mask oxygen  Post-op Assessment: Report given to RN, Post -op Vital signs reviewed and stable, and Patient moving all extremities X 4  Post vital signs: Reviewed and stable  Last Vitals:  Vitals Value Taken Time  BP 157/75 08/19/23 1436  Temp    Pulse 69 08/19/23 1437  Resp    SpO2 100 % 08/19/23 1437  Vitals shown include unfiled device data.  Last Pain:  Vitals:   08/19/23 1151  TempSrc:   PainSc: 6          Complications: No notable events documented.

## 2023-08-19 NOTE — Discharge Instructions (Signed)

## 2023-08-19 NOTE — H&P (Signed)
 History of Present Illness: Andrew Townsend is a 73 y.o. male who is seen today as an office consultation at the request of Dr. Jairo Ben for evaluation of New Consultation ( BILAT RECURRENT INGUINAL HERNIA ) .   Patient is a 73 year old male who comes in secondary to recurrent inguinal hernias. Patient has a history of CAD, COPD, chronic pain medication.   Patient states that he has noticed that the hernias have recurred, right greater than left.  He states that he had more pain to the right.  He states there getting bigger.  He is unsure whether or not the last hernias repaired 15 years ago or laparoscopic or open.   Patient does state he follows up with cardiology for his CAD status post CABG and pulmonary for his COPD.  Patient also chronic pain medication for back pain.         Review of Systems: A complete review of systems was obtained from the patient.  I have reviewed this information and discussed as appropriate with the patient.  See HPI as well for other ROS.   Review of Systems  Constitutional:  Negative for fever.  HENT:  Negative for congestion.   Eyes:  Negative for blurred vision.  Respiratory:  Negative for cough, shortness of breath and wheezing.   Cardiovascular:  Negative for chest pain and palpitations.  Gastrointestinal:  Negative for heartburn.  Genitourinary:  Negative for dysuria.  Musculoskeletal:  Negative for myalgias.  Skin:  Negative for rash.  Neurological:  Negative for dizziness and headaches.  Psychiatric/Behavioral:  Negative for depression and suicidal ideas.   All other systems reviewed and are negative.       Medical History: Past Medical History: DiagnosisDate            Arthritis                        CHF (congestive heart failure) (CMS/HHS-HCC)                  COPD (chronic obstructive pulmonary disease) (CMS/HHS-HCC)               GERD (gastroesophageal reflux disease)                  History of stroke                      Hyperlipidemia                         Hypertension        There is no problem list on file for this patient.     Past Surgical History: ProcedureLateralityDate            ENDOSCOPIC HARVEST RADIAL ARTERY FOR CABG                           HERNIA REPAIR                                JOINT REPLACEMENT                                 Right Kidney Stent  No Known Allergies   Current Outpatient Medications on File Prior to Visit MedicationSigDispenseRefill            albuterol (PROVENTIL) 2.5 mg /3 mL (0.083 %) nebulizer solution INHALE THE CONTENTS OF 1 VIAL VIA NEBULIZER EVERY 6 HOURS AS NEEDED FOR WHEEZING OR SHORTNESS OF BREATH.                         allopurinoL (ZYLOPRIM) 100 MG tablet        Take 100 mg by mouth once daily                             buprenorphine (BUTRANS) 15 mcg/hour      Place 1 patch onto the skin every 7 (seven) days                          ELIQUIS 2.5 mg tablet            Take 1 tablet by mouth 2 (two) times daily                             empagliflozin (JARDIANCE) 10 mg tablet      Take 10 mg by mouth once daily                               ezetimibe (ZETIA) 10 mg tablet          Take 1 tablet by mouth once daily                              FUROsemide (LASIX) 40 MG tablet  Take 40 mg by mouth                         gabapentin (NEURONTIN) 300 MG capsule Take 900 mg by mouth at bedtime                             metoprolol SUCCinate (TOPROL-XL) 100 MG XL tablet      Take 100 mg by mouth                         multivitamin tablet       Take 1 tablet by mouth once daily                              nitroGLYcerin (NITROSTAT) 0.4 MG SL tablet         Place 0.4 mg under the tongue                                    oxyBUTYnin (DITROPAN-XL) 10 MG XL tablet        Take 1 tablet by mouth once daily                            oxyCODONE-acetaminophen (PERCOCET) 10-325 mg tablet        TAKE 1 TABLET BY MOUTH EVERY 8 HOURS  AS NEEDED FOR PAIN. EFFECTIVE  05/25/2023                                    potassium chloride (KLOR-CON M10) 10 mEq ER tablet     Take 10 mEq by mouth once daily                                ramelteon (ROZEREM) 8 mg tablet   Take 8 mg by mouth                           rosuvastatin (CRESTOR) 40 MG tablet         Take 40 mg by mouth at bedtime                               tiZANidine (ZANAFLEX) 4 MG capsule         Take 4 mg by mouth                           TRELEGY ELLIPTA 100-62.5-25 mcg inhaler           Inhale 1 Puff into the lungs once daily                      No current facility-administered medications on file prior to visit.     History reviewed. No pertinent family history.    Social History   Tobacco Use Smoking StatusFormer Types:Cigarettes Smokeless TobaccoNot on file     Social History   Socioeconomic History Marital status:Widowed Tobacco Use Smoking status:Former                         Types: Cigarettes Vaping Use Vaping status:Some Days Substance and Sexual Activity Alcohol use:Not Currently Drug ZOX:WRUEA   Social Drivers of Health   Financial Resource Strain: Medium Risk (07/09/2022)             Received from [facility]             Overall Financial Resource Strain (CARDIA)                        Difficulty of Paying Living Expenses: Somewhat hard Food Insecurity: No Food Insecurity (07/09/2022)             Received from [facility]             Hunger Vital Sign                        Worried About Running Out of Food in the Last Year: Never true                        Ran Out of Food in the Last Year: Never true Transportation Needs: No Transportation Needs (07/09/2022)             [facility] from [facility] - Transportation  Lack of Transportation (Medical): No                        Lack of Transportation (Non-Medical): No Physical Activity: Sufficiently Active (07/09/2022)             Received from [facility]             Exercise  Vital Sign                        Days of Exercise per Week: 7 days                        Minutes of Exercise per Session: 40 min Stress: Stress Concern Present (07/09/2022)             Received from [facility] - Occupational Stress Questionnaire                        Feeling of Stress : To some extent Social Connections: Moderately Isolated (07/09/2022)             Received from [facility]             Social Connection and Isolation Panel [NHANES]                        Frequency of Communication with Friends and Family: More than three times a week                        Frequency of Social Gatherings with Friends and Family: More than three times a week                        Attends Religious Services: 1 to 4 times per year                        Active Member of [employer] or Organizations: No                        Attends Banker Meetings: Never                        Marital Status: Widowed Housing Stability: Unknown (05/29/2023)             Housing Stability Vital Sign                        Homeless in the Last Year: No     Objective:     BP (!) 159/66   Pulse (!) 56   Temp 98.2 F (36.8 C) (Oral)   Resp 18   Ht 5\' 6"  (1.676 m)   Wt 56.7 kg   SpO2 98%   BMI 20.18 kg/m     Body mass index is 19.82 kg/m. Physical Exam Constitutional:      Appearance: Normal appearance.  HENT:     Head: Normocephalic and atraumatic.     Nose: Nose normal. No congestion.     Mouth/Throat:     Mouth: Mucous membranes are moist.     Pharynx: Oropharynx is clear.  Eyes:     Pupils: Pupils  are equal, round, and reactive to light.  Cardiovascular:     Rate and Rhythm: Normal rate and regular rhythm.     Pulses: Normal pulses.     Heart sounds: Normal heart sounds. No murmur heard.    No friction rub. No gallop.  Pulmonary:     Effort: Pulmonary effort is normal. No respiratory distress.     Breath sounds: Normal  breath sounds. No stridor. No wheezing, rhonchi or rales.  Abdominal:     General: Abdomen is flat.     Hernia: A hernia is present. Hernia is present in the left inguinal area and right inguinal area.  Musculoskeletal:        General: Normal range of motion.     Cervical back: Normal range of motion.  Skin:    General: Skin is warm and dry.  Neurological:     General: No focal deficit present.     Mental Status: He is alert and oriented to person, place, and time.  Psychiatric:        Mood and Affect: Mood normal.        Thought Content: Thought content normal.          Assessment and Plan: Diagnoses and all orders for this visit:   Bilateral recurrent inguinal hernia without obstruction or gangrene     Andrew Townsend is a 73 y.o. male    1.          We will proceed to the OR for a open bilateral inguinal hernia repair with mesh. 2.         All risks and benefits were discussed with the patient, to generally include infection, bleeding, damage to surrounding structures, acute and chronic nerve pain, and recurrence. Alternatives were offered and described.  All questions were answered and the patient voiced understanding of the procedure and wishes to proceed at this point.             No follow-ups on file.   Axel Filler, MD, [facility], [place] [facility]

## 2023-08-19 NOTE — Op Note (Signed)
 08/19/2023  2:17 PM  PATIENT:  Andrew Townsend  73 y.o. male  PRE-OPERATIVE DIAGNOSIS:  RECURRENT BILATERAL INGUINAL HERNIA  POST-OPERATIVE DIAGNOSIS:  RECURRENT BILATERAL INGUINAL HERNIA, DIRECT bilateral   PROCEDURE:  Procedure(s): OPEN BILATERAL INGUINAL HERNIA REPAIR WITH MESH (Bilateral)  SURGEON:  Surgeons and Role:    Axel Filler, MD - Primary  ASSISTANTS: Gevena Cotton, RN   ANESTHESIA:   local and general  EBL:  minimal   BLOOD ADMINISTERED:none  DRAINS: none   LOCAL MEDICATIONS USED:  BUPIVICAINE  and OTHER exparel  SPECIMEN:  No Specimen  DISPOSITION OF SPECIMEN:  N/A  COUNTS:  YES  TOURNIQUET:  * No tourniquets in log *  DICTATION: .Dragon Dictation  Details of the procedure:  The patient was taken back to the operating room. The patient was placed in supine position with bilateral SCDs in place. The patient was prepped and draped in the usual sterile fashion.  After appropriate anitbiotics were confirmed, a time-out was confirmed and all facts were verified.  Quarter percent Marcaine was used to infiltrate the area of the incision and an ilioinguinal nerve block was also placed.   I started by repairing the left inguinal hernia.  A 5 cm incision was made just 1 cm superior to the inguinal ligament. Bovie cautery was used to maintain hemostasis dissection is carried down to the external oblique.  A standard incision was made laterally, and the external oblique was bluntly dissected away from the surrounding tissue with Metzenbaum scissors. The external oblique was elevated in the spermatic cord was bluntly dissected away from the surrounding tissue.  The ilioinguinal nerve was not identified  The spermatic cord and the hernia were then bluntly dissected away from the pubic tubercle and a Penrose was placed around the hernia sac in the spermatic cord. The vas deferens was identified and protected at all portions of the case. Dissection of the cremasterics took  place with Bovie cautery.  There appeared to be some weakness in the direct space.  This was imbricated using a running 2-0 Vicryl.  There was no indirect hernia sac.  At this time a left-sided Progrip mesh was then anchored to the pubic tubercle with a 2-0 Prolene.  It was anchored to the shelving edge of the external oblique x 1 and the conjoint tendon cephalad x 1.  The wrap around of the mesh was sutured to the conjoint tendon as well.  The new internal ring did not strangulate the spermatic cord.   The tail was then tucked under the external oblique. At this time the area was irrigated out with sterile saline.    The external oblique was reapproximated using a 2-0 Vicryl in a running fashion.  Exparel was injected inferior to the external oblique.  A ilioinguinal tap block was placed 1 cm proximal and medial to the ASIS.  Scarpa's fascia was then reapproximated using a 3-0 Vicryl running fashion. The skin was then reapproximated with 4 Monocryl in a subcuticular fashion.    A 5 cm incision was made just 1 cm superior to the inguinal ligament. Bovie cautery was used to maintain hemostasis dissection is carried down to the external oblique.  A standard incision was made laterally, and the external oblique was bluntly dissected away from the surrounding tissue with Metzenbaum scissors. The external oblique was elevated in the spermatic cord was bluntly dissected away from the surrounding tissue.  The ilioinguinal nerve was  identified and ligated using a 0 Vicryl.  The spermatic  cord and the hernia were then bluntly dissected away from the pubic tubercle and a Penrose was placed around the hernia sac in the spermatic cord. The vas deferens was identified and protected at all portions of the case. Dissection of the cremasterics took place with Bovie cautery.  There appeared to be some weakness in the direct space.  This was imbricated using a running 2-0 Vicryl.  There was no indirect hernia sac.  At this  time a right-sided Progrip mesh was then anchored to the pubic tubercle with a 2-0 Prolene.  It was anchored to the shelving edge of the external oblique x 1 and the conjoint tendon cephalad x 1.  The wrap around of the mesh was sutured to the conjoint tendon as well.  The new internal ring did not strangulate the spermatic cord.   The tail was then tucked under the external oblique. At this time the area was irrigated out with sterile saline.    The external oblique was reapproximated using a 2-0 Vicryl in a running fashion.  Exparel was injected inferior to the external oblique.  A ilioinguinal tap block was placed 1 cm proximal and medial to the ASIS.  Scarpa's fascia was then reapproximated using a 3-0 Vicryl running fashion. The skin was then reapproximated with 4 Monocryl in a subcuticular fashion.   The skin was then dressed with Dermabond.  The patient was taken to the recovery room in stable condition.     PLAN OF CARE: Discharge to home after PACU  PATIENT DISPOSITION:  PACU - hemodynamically stable.   Delay start of Pharmacological VTE agent (>24hrs) due to surgical blood loss or risk of bleeding: not applicable

## 2023-08-20 ENCOUNTER — Encounter (HOSPITAL_COMMUNITY): Payer: Self-pay | Admitting: General Surgery

## 2023-08-21 NOTE — Anesthesia Postprocedure Evaluation (Signed)
 Anesthesia Post Note  Patient: Andrew Townsend  Procedure(s) Performed: OPEN BILATERAL INGUINAL HERNIA REPAIR WITH MESH (Bilateral)     Patient location during evaluation: PACU Anesthesia Type: General Level of consciousness: awake and alert Pain management: pain level controlled Vital Signs Assessment: post-procedure vital signs reviewed and stable Respiratory status: spontaneous breathing, nonlabored ventilation, respiratory function stable and patient connected to nasal cannula oxygen Cardiovascular status: blood pressure returned to baseline and stable Postop Assessment: no apparent nausea or vomiting Anesthetic complications: no  No notable events documented.  Last Vitals:  Vitals:   08/19/23 1530 08/19/23 1545  BP: (!) 166/77 (!) 162/71  Pulse: 61 (!) 59  Resp: 19 14  Temp:  36.9 C  SpO2: 99% 98%    Last Pain:  Vitals:   08/19/23 1545  TempSrc:   PainSc: 3    Pain Goal:                   Codee Bloodworth L Teriann Livingood

## 2023-09-10 ENCOUNTER — Encounter: Payer: Self-pay | Admitting: Internal Medicine

## 2023-09-10 ENCOUNTER — Ambulatory Visit (INDEPENDENT_AMBULATORY_CARE_PROVIDER_SITE_OTHER): Payer: Medicare PPO

## 2023-09-10 DIAGNOSIS — I255 Ischemic cardiomyopathy: Secondary | ICD-10-CM | POA: Diagnosis not present

## 2023-09-10 LAB — CUP PACEART REMOTE DEVICE CHECK
Battery Remaining Longevity: 39 mo
Battery Voltage: 2.96 V
Brady Statistic RV Percent Paced: 6.77 %
Date Time Interrogation Session: 20250422054344
HighPow Impedance: 57 Ohm
Implantable Lead Connection Status: 753985
Implantable Lead Implant Date: 20180209
Implantable Lead Location: 753860
Implantable Lead Model: 6935
Implantable Pulse Generator Implant Date: 20180209
Lead Channel Impedance Value: 247 Ohm
Lead Channel Impedance Value: 304 Ohm
Lead Channel Sensing Intrinsic Amplitude: 2.375 mV
Lead Channel Sensing Intrinsic Amplitude: 2.375 mV
Lead Channel Setting Pacing Amplitude: 2 V
Lead Channel Setting Pacing Pulse Width: 0.5 ms
Lead Channel Setting Sensing Sensitivity: 0.3 mV
Zone Setting Status: 755011

## 2023-09-11 NOTE — Progress Notes (Unsigned)
 PROVIDER NOTE: Interpretation of information contained herein should be left to medically-trained personnel. Specific patient instructions are provided elsewhere under "Patient Instructions" section of medical record. This document was created in part using AI and STT-dictation technology, any transcriptional errors that may result from this process are unintentional.  Patient: Andrew Townsend  Service: E/M   PCP: Laneta Pintos, MD  DOB: 09-May-1951  DOS: 09/12/2023  Provider: Cherylin Corrigan, NP  MRN: [id number]  Delivery: Face-to-face  Specialty: Interventional Pain Management  Type: Established Patient  Setting: Ambulatory outpatient facility  Specialty designation: 09  Referring Prov.: Noreene Bearded, PA  Location: Outpatient office facility       HPI  Mr. Arrian Manson, a 73 y.o. year old male, is here today because of his No primary diagnosis found.. Mr. Leyh primary complain today is No chief complaint on file.   Pain Assessment: Severity of   is reported as a  /10. Location:    / . Onset:  . Quality:  . Timing:  . Modifying factor(s):  Aaron Aas Vitals:  vitals were not taken for this visit.  BMI: Estimated body mass index is 20.18 kg/m as calculated from the following:   Height as of 08/19/23: 5\' 6"  (1.676 m).   Weight as of 08/19/23: 125 lb (56.7 kg). Last encounter: 06/20/2023.  Reason for encounter: medication management.  The patient reports doing well on the current medication regimen.  No adverse reaction or side effects reported to medication. Pharmacotherapy Assessment  Analgesic: Oxycodone -acetaminophen  (Percocet) 10-325 mg tablet every 8 hours as needed for pain. MME=45 Buprenorphine  (Butrans ) 20 mcg/hr once a week.  Monitoring:  PMP: PDMP reviewed during this encounter.       Pharmacotherapy: No side-effects or adverse reactions reported. Compliance: No problems identified. Effectiveness: Clinically acceptable.  No notes on file  No results found for: "CBDTHCR" No results found  for: "D8THCCBX" No results found for: "D9THCCBX"  UDS:  Summary  Date Value Ref Range Status  12/18/2022 Note  Final    Comment:    ==================================================================== [facility] (MW) ==================================================================== Test                             Result       Flag       Units  Drug Present and Declared for Prescription Verification   Oxycodone                       3473         EXPECTED   ng/mg creat   Oxymorphone                    1617         EXPECTED   ng/mg creat   Noroxycodone                   4722         EXPECTED   ng/mg creat   Noroxymorphone                 992          EXPECTED   ng/mg creat    Sources of oxycodone  are scheduled prescription medications.    Oxymorphone, noroxycodone, and noroxymorphone are expected    metabolites of oxycodone . Oxymorphone is also available as a    scheduled prescription medication.    Buprenorphine   21           EXPECTED   ng/mg creat   Norbuprenorphine               36           EXPECTED   ng/mg creat    Source of buprenorphine  is a scheduled prescription medication.    Norbuprenorphine is an expected metabolite of buprenorphine .  ==================================================================== Test                      Result    Flag   Units      Ref Range   Creatinine              89               mg/dL      >=16 ==================================================================== Declared Medications:  The flagging and interpretation on this report are based on the  following declared medications.  Unexpected results may arise from  inaccuracies in the declared medications.   **Note: The testing scope of this panel includes these medications:   Oxycodone  (Percocet)   **Note: The testing scope of this panel does not include small to  moderate amounts of these reported medications:   Buprenorphine  Patch (BuTrans )   **Note: The  testing scope of this panel does not include the  following reported medications:   Acetaminophen  (Percocet)  Albuterol   Allopurinol   Apixaban  (Eliquis )  Empagliflozin (Jardiance)  Ezetimibe  (Zetia )  Fluticasone (Trelegy)  Furosemide  (Lasix )  Gabapentin  (Neurontin )  Magnesium (Mag-Ox)  Metoprolol   Multivitamin  Nitroglycerin  (Nitrostat )  Omeprazole   Oxybutynin   Potassium (Klor-Con )  Rosuvastatin   Tizanidine   Umeclidinium (Trelegy)  Vilanterol (Trelegy)  Zolpidem  (Ambien ) ==================================================================== For clinical consultation, please call [phone]. ====================================================================       ROS  Constitutional: Denies any fever or chills Gastrointestinal: No reported hemesis, hematochezia, vomiting, or acute GI distress Musculoskeletal: Denies any acute onset joint swelling, redness, loss of ROM, or weakness Neurological: No reported episodes of acute onset apraxia, aphasia, dysarthria, agnosia, amnesia, paralysis, loss of coordination, or loss of consciousness  Medication Review  Fluticasone-Umeclidin-Vilant, Magnesium Oxide, albuterol , allopurinol , apixaban , buprenorphine , docusate sodium, ezetimibe , furosemide , gabapentin , metoprolol  succinate, multivitamin, nitroGLYCERIN , omeprazole , oxyCODONE -acetaminophen , potassium chloride , ramelteon , rosuvastatin , tamsulosin , tiZANidine , and traMADol   History Review  Allergy: Mr. Hippler has no known allergies. Drug: Mr. Vanderwall  reports no history of drug use. Alcohol:  reports that he does not currently use alcohol. Tobacco:  reports that he quit smoking about 10 years ago. His smoking use included e-cigarettes and cigarettes. He uses smokeless tobacco. Social: Mr. Akhavan  reports that he quit smoking about 10 years ago. His smoking use included e-cigarettes and cigarettes. He uses smokeless tobacco. He reports that he does not currently use alcohol. He  reports that he does not use drugs. 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 Surgical: Mr. Zalewski  has a past surgical history that includes Coronary artery bypass graft; Hernia repair; Joint replacement (Right); Tonsillectomy; Wisdom tooth extraction; Upper gi endoscopy; Colonoscopy; arm surgery (Right); ICD IMPLANT; Eye surgery (Bilateral); Multiple tooth extractions; Mandible surgery; and Inguinal hernia repair (Bilateral, 08/19/2023). Family: family history is not on file.  Laboratory Chemistry Profile  Renal Lab Results  Component Value Date   BUN 13 08/19/2023   CREATININE 1.25 (H) 08/19/2023   BCR 12 04/17/2023   GFRNONAA >60 08/19/2023    Hepatic Lab Results  Component Value Date   AST 26 04/17/2023   ALT 32 04/17/2023   ALBUMIN 4.4 04/17/2023   ALKPHOS 99 04/17/2023    Electrolytes Lab Results  Component Value Date   NA 140 08/19/2023   K 3.7 08/19/2023   CL 106 08/19/2023   CALCIUM  9.2 08/19/2023   MG 2.1 07/31/2022    Bone Lab Results  Component Value Date   VD25OH 64.6 11/07/2022    Inflammation (CRP: Acute Phase) (ESR: Chronic Phase) Lab Results  Component Value Date   CRP 18 (H) 06/26/2022   ESRSEDRATE 6 06/26/2022         Note: Above Lab results reviewed.  Recent Imaging Review  CUP PACEART REMOTE DEVICE CHECK ICD Scheduled remote reviewed. Normal device function.  Presenting rhythm:  VS 2 NSVT, HR's 188, 6-17 beats in duraiton 300 logged AF events, longest duration 2hrs, overall controlled rates,  burden 2.9%, Eliqus per PA report. Next remote 91 days. LA, CVRS Note: Reviewed        Physical Exam  General appearance: Well nourished, well developed, and well hydrated. In no apparent acute distress Mental status: Alert, oriented x 3 (person, place, & time)       Respiratory: No evidence of acute respiratory distress Eyes: PERLA Vitals: There were no vitals taken for this visit. BMI: Estimated body mass index is 20.18 kg/m as calculated from the following:   Height as of 08/19/23: 5\' 6"  (1.676 m).   Weight as of 08/19/23: 125 lb (56.7 kg). Ideal: Patient weight not recorded  Assessment   Diagnosis Status  1. 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 Piriformis syndrome of both sides   3. Chronic pain syndrome   4. Lumbar facet arthropathy   5. Encounter for long-term opiate analgesic use   6. Neuropathy   7. Other idiopathic scoliosis, thoracolumbar region   8. Hx of shoulder surgery (left rotator cuff surgery, right rotator cuff then reverse total shoulder arthoplasty)    Controlled Controlled Controlled   Plan of Care  Assessment and Plan   Pharmacotherapy (Medications Ordered): No orders of the defined types were placed in this encounter.  Orders:  No orders of the defined types were placed in this encounter.  Follow-up plan:   No follow-ups on file.    Recent Visits Date Type Provider Dept  06/20/23 Office Visit Cephus Collin, MD [facility]  Showing recent visits within past 90 days and meeting all other requirements Future Appointments Date Type Provider Dept  09/12/23 Appointment Cephus Collin, MD [facility]  Showing future appointments within next 90 days and meeting all other requirements  I discussed the assessment and treatment plan with the patient. The patient was provided an opportunity to ask questions and all were answered. The patient agreed with the plan and demonstrated an understanding of the  instructions.  Patient advised to call back or seek an in-person evaluation if the symptoms or condition worsens.  Duration of encounter: *** minutes.  Total time on encounter, as per AMA guidelines included both the face-to-face and non-face-to-face time personally spent by the physician and/or other qualified health care professional(s) on the day of the encounter (includes time in activities that require the physician or other qualified health care professional and does not  include time in activities normally performed by clinical staff). Physician's time may include the following activities when performed: Preparing to see the patient (e.g., pre-charting review of records, searching for previously ordered imaging, lab work, and nerve conduction tests) Review of prior analgesic pharmacotherapies. Reviewing PMP Interpreting ordered tests (e.g., lab work, imaging, nerve conduction tests) Performing post-procedure evaluations, including interpretation of diagnostic procedures Obtaining and/or reviewing separately obtained history Performing a medically appropriate examination and/or evaluation Counseling and educating the patient/family/caregiver Ordering medications, tests, or procedures Referring and communicating with other health care professionals (when not separately reported) Documenting clinical information in the electronic or other health record Independently interpreting results (not separately reported) and communicating results to the patient/ family/caregiver Care coordination (not separately reported)  Note by: Buck Mcaffee K Zendayah Hardgrave, NP (TTS and AI technology used. I apologize for any typographical errors that were not detected and corrected.) Date: 09/12/2023; Time: 3:58 PM

## 2023-09-12 ENCOUNTER — Ambulatory Visit
Payer: Medicare PPO | Attending: Student in an Organized Health Care Education/Training Program | Admitting: Student in an Organized Health Care Education/Training Program

## 2023-09-12 ENCOUNTER — Encounter: Payer: Self-pay | Admitting: Student in an Organized Health Care Education/Training Program

## 2023-09-12 VITALS — BP 154/62 | HR 56 | Temp 98.8°F | Resp 16 | Ht 66.0 in | Wt 131.0 lb

## 2023-09-12 DIAGNOSIS — M4125 Other idiopathic scoliosis, thoracolumbar region: Secondary | ICD-10-CM

## 2023-09-12 DIAGNOSIS — G894 Chronic pain syndrome: Secondary | ICD-10-CM | POA: Diagnosis not present

## 2023-09-12 DIAGNOSIS — M461 Sacroiliitis, not elsewhere classified: Secondary | ICD-10-CM | POA: Diagnosis not present

## 2023-09-12 DIAGNOSIS — Z79891 Long term (current) use of opiate analgesic: Secondary | ICD-10-CM

## 2023-09-12 DIAGNOSIS — G588 Other specified mononeuropathies: Secondary | ICD-10-CM | POA: Diagnosis not present

## 2023-09-12 DIAGNOSIS — M47816 Spondylosis without myelopathy or radiculopathy, lumbar region: Secondary | ICD-10-CM

## 2023-09-12 DIAGNOSIS — Z9889 Other specified postprocedural states: Secondary | ICD-10-CM

## 2023-09-12 DIAGNOSIS — G629 Polyneuropathy, unspecified: Secondary | ICD-10-CM

## 2023-09-12 DIAGNOSIS — G5703 Lesion of sciatic nerve, bilateral lower limbs: Secondary | ICD-10-CM | POA: Diagnosis not present

## 2023-09-12 MED ORDER — GABAPENTIN 600 MG PO TABS
1200.0000 mg | ORAL_TABLET | Freq: Every day | ORAL | 2 refills | Status: DC
Start: 2023-09-12 — End: 2023-12-23

## 2023-09-12 MED ORDER — OXYCODONE-ACETAMINOPHEN 10-325 MG PO TABS: 1.0000 | ORAL_TABLET | Freq: Three times a day (TID) | ORAL | 0 refills | Status: AC | PRN

## 2023-09-12 MED ORDER — TIZANIDINE HCL 4 MG PO TABS: 4.0000 mg | ORAL_TABLET | Freq: Three times a day (TID) | ORAL | 5 refills | Status: DC | PRN

## 2023-09-12 MED ORDER — OXYCODONE-ACETAMINOPHEN 10-325 MG PO TABS
1.0000 | ORAL_TABLET | Freq: Three times a day (TID) | ORAL | 0 refills | Status: DC | PRN
Start: 2023-11-21 — End: 2023-12-11

## 2023-09-12 MED ORDER — BUPRENORPHINE 20 MCG/HR TD PTWK: 1.0000 | MEDICATED_PATCH | TRANSDERMAL | 2 refills | Status: DC

## 2023-09-12 NOTE — Progress Notes (Signed)
 Nursing Pain Medication Assessment:  Safety precautions to be maintained throughout the outpatient stay will include: orient to surroundings, keep bed in low position, maintain call bell within reach at all times, provide assistance with transfer out of bed and ambulation.  Medication Inspection Compliance: Pill count conducted under aseptic conditions, in front of the patient. Neither the pills nor the bottle was removed from the patient's sight at any time. Once count was completed pills were immediately returned to the patient in their original bottle.  Medication: Oxycodone /APAP Pill/Patch Count:  9 0 Pill/Patch Appearance: Markings consistent with prescribed medication Bottle Appearance: Standard pharmacy container. Clearly labeled. Filled Date: 04 / 04 / 2025 Last Medication intake:  Today

## 2023-09-12 NOTE — Patient Instructions (Signed)

## 2023-09-12 NOTE — Progress Notes (Signed)
 PROVIDER NOTE: Interpretation of information contained herein should be left to medically-trained personnel. Specific patient instructions are provided elsewhere under "Patient Instructions" section of medical record. This document was created in part using AI and STT-dictation technology, any transcriptional errors that may result from this process are unintentional.  Patient: Andrew Townsend  Service: E/M   PCP: Laneta Pintos, MD  DOB: 1951/01/24  DOS: 09/12/2023  Provider: Cephus Collin, MD  MRN: [id number]  Delivery: Face-to-face  Specialty: Interventional Pain Management  Type: Established Patient  Setting: Ambulatory outpatient facility  Specialty designation: 09  Referring Prov.: Noreene Bearded, PA  Location: Outpatient office facility       HPI  [name], a 73 y.o. 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 [name] primary complain today is No chief complaint on file.  Pertinent problems: 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 Pain Assessment: Severity of Chronic pain is reported as a 7 /10. Location: Back Lower/right leg, sometimes left leg. Onset: More than a month ago. Quality: [place]. Timing: Constant. Modifying factor(s): sitting, ice, heat. Vitals:  height is 5\' 6"  (1.676 m) and weight is 131 lb (59.4 kg). His temporal temperature is 98.8 F (37.1 C). His blood pressure is 154/62 (abnormal) and his pulse is 56 (abnormal). His respiration is 16 and oxygen saturation is 97%.  BMI: Estimated body mass  index is 21.14 kg/m as calculated from the following:   Height as of this encounter: 5\' 6"  (1.676 m).   Weight as of this encounter: 131 lb (59.4 kg). Last encounter: 06/20/2023. Last procedure: 04/10/2023.  Reason for encounter:  Patient presents today for medication management as well as increased pain overlying his SI joint, iliac crest and deep buttocks.  He recently underwent a hernia surgery.  He states that recovery is more painful than he had anticipated.  He states that his surgical incisions are also larger than anticipated.  He states that he has increased pain in his inguinal region with increased intra-abdominal pressure.  Hopefully with time this will get better.  He is requesting a refill of his chronic pain medications as well.  He takes gabapentin  900 mg nightly and we discussed increasing that to 1200 mg nightly.  This is renally dosed based upon his reduced GFR.  He has a follow-up with his nephrologist next month.  He will inform me if his kidney function deteriorates.  He is status post bilateral SI joint and piriformis injection 01/02/2023 I provided him with approximately 70% pain relief for 5 months.  He would like to repeat these.  We also discussed a cluneal nerve block as he does have pain overlying his iliac crest.    Pharmacotherapy Assessment  Analgesic:  08/23/2023 06/20/2023  1 Oxycodone -Acetaminophen  10-325 90.00 30 Bi Lat [id number] Pie (9071) 0/0 45.00 MME Comm Ins [place]  08/23/2023 06/20/2023  1 Buprenorphine  20 Mcg/hr Patch 4.00 28 Bi Lat [id number] Pie (9071) 2/2 0.48 mg Comm Ins [place]    Monitoring: [place] PMP: PDMP reviewed during this encounter.       Pharmacotherapy: No side-effects or adverse reactions reported. Compliance: No problems identified.  Effectiveness: Clinically acceptable.  Calton Catholic, RN  09/12/2023  3:02 PM  Sign when Signing Visit Nursing Pain Medication Assessment:  Safety precautions to be maintained throughout the outpatient stay will  include: orient to surroundings, keep bed in low position, maintain call bell within reach at all times, provide assistance with transfer out of bed and ambulation.  Medication Inspection Compliance: Pill count conducted under aseptic conditions, in front of the patient. Neither the pills nor the bottle was removed from the patient's sight at any time. Once count was completed pills were immediately returned to the patient in their original bottle.  Medication: Oxycodone /APAP Pill/Patch Count:  9 0 Pill/Patch Appearance: Markings consistent with prescribed medication Bottle Appearance: Standard pharmacy container. Clearly labeled. Filled Date: 04 / 04 / 2025 Last Medication intake:  Today    No results found for: "CBDTHCR" No results found for: "D8THCCBX" No results found for: "D9THCCBX"  UDS:  Summary  Date Value Ref Range Status  12/18/2022 Note  Final    Comment:    ==================================================================== [employer] Select 13 (MW) ==================================================================== Test                             Result       Flag       Units  Drug Present and Declared for Prescription Verification   Oxycodone                       3473         EXPECTED   ng/mg creat   Oxymorphone                    1617         EXPECTED   ng/mg creat   Noroxycodone                   4722         EXPECTED   ng/mg creat   Noroxymorphone                 992          EXPECTED   ng/mg creat    Sources of oxycodone  are scheduled prescription medications.    Oxymorphone, noroxycodone, and noroxymorphone are expected    metabolites of oxycodone . Oxymorphone is also available as a    scheduled prescription medication.    Buprenorphine                   21           EXPECTED   ng/mg creat   Norbuprenorphine               36           EXPECTED   ng/mg creat    Source of buprenorphine  is a scheduled prescription medication.    Norbuprenorphine is an expected  metabolite of buprenorphine .  ==================================================================== Test                      Result    Flag   Units      Ref Range   Creatinine              89               mg/dL      >=16 ==================================================================== Declared Medications:  The flagging and interpretation on this report are based on  the  following declared medications.  Unexpected results may arise from  inaccuracies in the declared medications.   **Note: The testing scope of this panel includes these medications:   Oxycodone  (Percocet)   **Note: The testing scope of this panel does not include small to  moderate amounts of these reported medications:   Buprenorphine  Patch (BuTrans )   **Note: The testing scope of this panel does not include the  following reported medications:   Acetaminophen  (Percocet)  Albuterol   Allopurinol   Apixaban  (Eliquis )  Empagliflozin (Jardiance)  Ezetimibe  (Zetia )  Fluticasone (Trelegy)  Furosemide  (Lasix )  Gabapentin  (Neurontin )  Magnesium (Mag-Ox)  Metoprolol   Multivitamin  Nitroglycerin  (Nitrostat )  Omeprazole   Oxybutynin   Potassium (Klor-Con )  Rosuvastatin   Tizanidine   Umeclidinium (Trelegy)  Vilanterol (Trelegy)  Zolpidem  (Ambien ) ==================================================================== For clinical consultation, please call [phone]. ====================================================================       ROS  Constitutional: Denies any fever or chills Gastrointestinal: No reported hemesis, hematochezia, vomiting, or acute GI distress Musculoskeletal:  SI joint pain, buttock pain, low back pain Neurological: No reported episodes of acute onset apraxia, aphasia, dysarthria, agnosia, amnesia, paralysis, loss of coordination, or loss of consciousness  Medication Review  Fluticasone-Umeclidin-Vilant, Magnesium Oxide, albuterol , allopurinol , apixaban , buprenorphine ,  docusate sodium, ezetimibe , furosemide , gabapentin , metoprolol  succinate, multivitamin, nitroGLYCERIN , omeprazole , oxyCODONE -acetaminophen , potassium chloride , ramelteon , rosuvastatin , tamsulosin , and tiZANidine   History Review  Allergy: [name] has no known allergies. Drug: [name]  reports no history of drug use. Alcohol:  reports that he does not currently use alcohol. Tobacco:  reports that he quit smoking about 10 years ago. His smoking use included e-cigarettes and cigarettes. He uses smokeless tobacco. Social: [name]  reports that he quit smoking about 10 years ago. His smoking use included e-cigarettes and cigarettes. He uses smokeless tobacco. He reports that he does not currently use alcohol. He reports that he does not use drugs. 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 Surgical: [name]  has a past surgical history that includes Coronary artery bypass graft; Hernia repair; Joint replacement (Right); Tonsillectomy; Wisdom tooth extraction; Upper gi endoscopy; Colonoscopy; arm surgery (Right); ICD IMPLANT; Eye surgery (Bilateral); Multiple tooth extractions; Mandible surgery; and Inguinal hernia repair (Bilateral, 08/19/2023). Family: family history is not on file.  Laboratory Chemistry Profile   Renal Lab Results  Component Value Date   BUN 13 08/19/2023   CREATININE 1.25 (H) 08/19/2023   BCR 12 04/17/2023   GFRNONAA >60 08/19/2023    Hepatic Lab Results  Component Value Date   AST 26 04/17/2023   ALT 32 04/17/2023    ALBUMIN 4.4 04/17/2023   ALKPHOS 99 04/17/2023    Electrolytes Lab Results  Component Value Date   NA 140 08/19/2023   K 3.7 08/19/2023   CL 106 08/19/2023   CALCIUM  9.2 08/19/2023   MG 2.1 07/31/2022    Bone Lab Results  Component Value Date   VD25OH 64.6 11/07/2022    Inflammation (CRP: Acute Phase) (ESR: Chronic Phase) Lab Results  Component Value Date   CRP 18 (H) 06/26/2022   ESRSEDRATE 6 06/26/2022         Note: Above Lab results reviewed.  Recent Imaging Review    CLINICAL DATA:  Chronic lower back pain radiating to  the bilateral legs, right-greater-than-left.   EXAM: LUMBAR SPINE - COMPLETE WITH BENDING VIEWS   COMPARISON:  None Available.   FINDINGS: There is diffuse decreased bone mineralization. There are hypoplastic ribs at the vertebral body considered T12. Distal to this the next 5 vertebral bodies are considered L1 through L5. The superior aspect of the iliac crests is at the level of the mid to superior and mid to inferior aspect of the L4 vertebral body on lateral view.   There is 6 mm retrolisthesis of L1 on L2, unchanged on neutral, flexion, and extension views. Moderate to severe levocurvature centered at L2.   Severe right L1-2 and L2-3 and moderate right L3-4 and L4-5 disc space narrowing with multilevel degenerative endplate osteophytes. Moderate to severe L5-S1 disc space narrowing with vacuum phenomenon.   Facet joint arthropathy is greatest at L5-S1.   Partial visualization of cardiac AICD leads.   Hernia mesh coils overlie the pelvis.   Moderate atherosclerotic calcifications within the abdominal aorta. A vascular stent overlies the L1-2 disc level.   IMPRESSION: 1. Moderate to severe levocurvature centered at L2. 2. Severe multilevel degenerative disc and endplate changes. 3. Mild retrolisthesis of L1 on L2.     Electronically Signed   By: Bertina Broccoli M.D.   On: 12/08/2021 11:18 Note: Reviewed        Physical Exam   General appearance: Well nourished, well developed, and well hydrated. In no apparent acute distress Mental status: Alert, oriented x 3 (person, place, & time)       Respiratory: No evidence of acute respiratory distress Eyes: PERLA Vitals: BP (!) 154/62 (Cuff Size: Normal)   Pulse (!) 56   Temp 98.8 F (37.1 C) (Temporal)   Resp 16   Ht 5\' 6"  (1.676 m)   Wt 131 lb (59.4 kg)   SpO2 97%   BMI 21.14 kg/m  BMI: Estimated body mass index is 21.14 kg/m as calculated from the following:   Height as of this encounter: 5\' 6"  (1.676 m).   Weight as of this encounter: 131 lb (59.4 kg). Ideal: Ideal body weight: 63.8 kg (140 lb 10.5 oz)  Lumbar Spine Area Exam  Skin & Axial Inspection: No masses, redness, or swelling Alignment: Symmetrical Functional ROM: Pain restricted ROM       Stability: No instability detected Muscle Tone/Strength: Functionally intact. No obvious neuro-muscular anomalies detected. Sensory (Neurological): musculoskeletal Palpation: Tender to palpation overlying iliac crest Provocative Tests: Hyperextension/rotation test: deferred today       Lumbar quadrant test (Kemp's test): deferred today       Lateral bending test: deferred today       Patrick's Maneuver: (+) for bilateral S-I arthralgia             FABER* test: +) for bilateral S-I arthralgia        S-I anterior distraction/compression test: (+) for bilateral S-I arthralgia   S-I lateral compression test: (+) for bilateral S-I arthralgia   S-I Thigh-thrust test: (+) for bilateral S-I arthralgia   S-I Gaenslen's test: (+) for bilateral S-I arthralgia   *(Flexion, ABduction and External Rotation)             Gait & Posture Assessment  Ambulation: Unassisted Gait: Relatively normal for age and body habitus Posture: WNL  Lower Extremity Exam      Side: Right lower extremity   Side: Left lower extremity  Stability: No instability observed           Stability: No  instability observed          Skin &  Extremity Inspection: Skin color, temperature, and hair growth are WNL. No peripheral edema or cyanosis. No masses, redness, swelling, asymmetry, or associated skin lesions. No contractures.   Skin & Extremity Inspection: Skin color, temperature, and hair growth are WNL. No peripheral edema or cyanosis. No masses, redness, swelling, asymmetry, or associated skin lesions. No contractures.  Functional ROM: Pain restricted ROM for hip and knee joints           Functional ROM: Pain restricted ROM for hip and knee joints          Muscle Tone/Strength: Functionally intact. No obvious neuro-muscular anomalies detected.   Muscle Tone/Strength: Functionally intact. No obvious neuro-muscular anomalies detected.  Sensory (Neurological): Musculoskeletal pain pattern         Sensory (Neurological): Musculoskeletal pain pattern        DTR: Patellar: deferred today Achilles: deferred today Plantar: deferred today   DTR: Patellar: deferred today Achilles: deferred today Plantar: deferred today  Palpation: No palpable anomalies   Palpation: No palpable anomalies    Assessment   Diagnosis Status  1. Cluneal neuropathy   2. 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 Piriformis syndrome of both sides   4. Chronic pain syndrome   5. Lumbar facet arthropathy   6. Encounter for long-term opiate analgesic use   7. Neuropathy   8. Other idiopathic scoliosis, thoracolumbar region   9. Hx of shoulder surgery (left rotator cuff surgery, right rotator cuff then reverse total shoulder arthoplasty)    Having a Flare-up Having a Flare-up Having a Flare-up   Updated Problems: Problem  Cluneal Neuropathy    Plan of Care  Discussed repeating bilateral SI joint block and piriformis injection.  He previously had these done August 2024 that provided him 70% pain relief for approximately 5 months.  He also has pain overlying his iliac crest consistent with cluneal neuropathy.  We discussed a diagnostic  cluneal nerve block. We will increase his gabapentin  to 1200 mg nightly.  I changed his tablet to 600 mg and he is instructed to take 2 at night. Continue with other multimodal analgesics as prescribed   1. 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  Dispense: 90 tablet; Refill: 0 - oxyCODONE -acetaminophen  (PERCOCET) 10-325 MG tablet; Take 1 tablet by mouth every 8 (eight) hours as needed for pain.  Dispense: 90 tablet; Refill: 0 - oxyCODONE -acetaminophen  (PERCOCET) 10-325 MG tablet; Take 1 tablet by mouth every 8 (eight) hours as needed for pain.  Dispense: 90 tablet; Refill: 0  2. Piriformis syndrome of both sides - TRIGGER POINT INJECTION - oxyCODONE -acetaminophen  (PERCOCET) 10-325 MG tablet; Take 1 tablet by mouth every 8 (eight) hours as needed for pain.  Dispense: 90 tablet; Refill: 0 - oxyCODONE -acetaminophen  (PERCOCET) 10-325 MG tablet; Take 1 tablet by mouth every 8 (eight) hours as needed for pain.  Dispense: 90 tablet; Refill: 0 - oxyCODONE -acetaminophen  (PERCOCET) 10-325 MG tablet; Take 1 tablet by mouth every 8 (eight) hours as needed for pain.  Dispense: 90 tablet; Refill: 0 - tiZANidine  (ZANAFLEX ) 4 MG tablet; Take 1 tablet (4 mg total) by mouth every 8 (eight) hours as needed.  Dispense: 90 tablet; Refill: 5  3. Cluneal neuropathy (Primary) - CLUNEAL NERVE BLOCK; Future  4. Chronic pain syndrome -  SACROILIAC JOINT INJECTION; Future - TRIGGER POINT INJECTION - CLUNEAL NERVE BLOCK; Future - oxyCODONE -acetaminophen  (PERCOCET) 10-325 MG tablet; Take 1 tablet by mouth every 8 (eight) hours as needed for pain.  Dispense: 90 tablet; Refill: 0 - oxyCODONE -acetaminophen  (PERCOCET) 10-325 MG tablet; Take 1 tablet by mouth every 8 (eight) hours as needed for pain.  Dispense: 90 tablet; Refill: 0 - oxyCODONE -acetaminophen   (PERCOCET) 10-325 MG tablet; Take 1 tablet by mouth every 8 (eight) hours as needed for pain.  Dispense: 90 tablet; Refill: 0  5. Lumbar facet arthropathy - oxyCODONE -acetaminophen  (PERCOCET) 10-325 MG tablet; Take 1 tablet by mouth every 8 (eight) hours as needed for pain.  Dispense: 90 tablet; Refill: 0 - oxyCODONE -acetaminophen  (PERCOCET) 10-325 MG tablet; Take 1 tablet by mouth every 8 (eight) hours as needed for pain.  Dispense: 90 tablet; Refill: 0 - oxyCODONE -acetaminophen  (PERCOCET) 10-325 MG tablet; Take 1 tablet by mouth every 8 (eight) hours as needed for pain.  Dispense: 90 tablet; Refill: 0  6. Encounter for long-term opiate analgesic use - oxyCODONE -acetaminophen  (PERCOCET) 10-325 MG tablet; Take 1 tablet by mouth every 8 (eight) hours as needed for pain.  Dispense: 90 tablet; Refill: 0 - oxyCODONE -acetaminophen  (PERCOCET) 10-325 MG tablet; Take 1 tablet by mouth every 8 (eight) hours as needed for pain.  Dispense: 90 tablet; Refill: 0 - oxyCODONE -acetaminophen  (PERCOCET) 10-325 MG tablet; Take 1 tablet by mouth every 8 (eight) hours as needed for pain.  Dispense: 90 tablet; Refill: 0  7. Neuropathy - oxyCODONE -acetaminophen  (PERCOCET) 10-325 MG tablet; Take 1 tablet by mouth every 8 (eight) hours as needed for pain.  Dispense: 90 tablet; Refill: 0 - oxyCODONE -acetaminophen  (PERCOCET) 10-325 MG tablet; Take 1 tablet by mouth every 8 (eight) hours as needed for pain.  Dispense: 90 tablet; Refill: 0 - oxyCODONE -acetaminophen  (PERCOCET) 10-325 MG tablet; Take 1 tablet by mouth every 8 (eight) hours as needed for pain.  Dispense: 90 tablet; Refill: 0  8. Other idiopathic scoliosis, thoracolumbar region - oxyCODONE -acetaminophen  (PERCOCET) 10-325 MG tablet; Take 1 tablet by mouth every 8 (eight) hours as needed for pain.  Dispense: 90 tablet; Refill: 0 - oxyCODONE -acetaminophen  (PERCOCET) 10-325 MG tablet; Take 1 tablet by mouth every 8 (eight) hours as needed for pain.  Dispense: 90  tablet; Refill: 0 - oxyCODONE -acetaminophen  (PERCOCET) 10-325 MG tablet; Take 1 tablet by mouth every 8 (eight) hours as needed for pain.  Dispense: 90 tablet; Refill: 0  9. Hx of shoulder surgery (left rotator cuff surgery, right rotator cuff then reverse total shoulder arthoplasty) - oxyCODONE -acetaminophen  (PERCOCET) 10-325 MG tablet; Take 1 tablet by mouth every 8 (eight) hours as needed for pain.  Dispense: 90 tablet; Refill: 0 - oxyCODONE -acetaminophen  (PERCOCET) 10-325 MG tablet; Take 1 tablet by mouth every 8 (eight) hours as needed for pain.  Dispense: 90 tablet; Refill: 0 - oxyCODONE -acetaminophen  (PERCOCET) 10-325 MG tablet; Take 1 tablet by mouth every 8 (eight) hours as needed for pain.  Dispense: 90 tablet; Refill: 0  [name] has a current medication list which includes the following long-term medication(s): albuterol , albuterol , allopurinol , eliquis , ezetimibe , gabapentin , nitroglycerin , omeprazole , potassium chloride , ramelteon , rosuvastatin , furosemide , metoprolol  succinate, and tizanidine .  Pharmacotherapy (Medications Ordered): Meds ordered this encounter  Medications   oxyCODONE -acetaminophen  (PERCOCET) 10-325 MG tablet    Sig: Take 1 tablet by mouth every 8 (eight) hours as needed for pain.    Dispense:  90 tablet    Refill:  0   oxyCODONE -acetaminophen  (PERCOCET) 10-325 MG tablet    Sig: Take 1 tablet by  mouth every 8 (eight) hours as needed for pain.    Dispense:  90 tablet    Refill:  0   oxyCODONE -acetaminophen  (PERCOCET) 10-325 MG tablet    Sig: Take 1 tablet by mouth every 8 (eight) hours as needed for pain.    Dispense:  90 tablet    Refill:  0   gabapentin  (NEURONTIN ) 600 MG tablet    Sig: Take 2 tablets (1,200 mg total) by mouth at bedtime.    Dispense:  60 tablet    Refill:  2   buprenorphine  (BUTRANS ) 20 MCG/HR PTWK    Sig: Place 1 patch onto the skin once a week.    Dispense:  4 patch    Refill:  2   tiZANidine  (ZANAFLEX ) 4 MG tablet    Sig:  Take 1 tablet (4 mg total) by mouth every 8 (eight) hours as needed.    Dispense:  90 tablet    Refill:  5   Orders:  Orders Placed This Encounter  Procedures   SACROILIAC JOINT INJECTION    Physical Examination Findings: Positive Sacral Thrust (Sacral Spring, Downward Pressure): (Y) Positive FABER maneuver (Patrick's): (Y) Positive SI distraction (Gapping): (Y) Positive SI compression (Approximation): (Y) Positive Thigh Thrust:  (Y) Positive Gaenslen's: (Y) Positive Sacral Sulcus Tenderness: (Y)    Standing Status:   Future    Expiration Date:   12/12/2023    Scheduling Instructions:     Procedure: Sacroiliac Joint Injection     Side  Laterality: Bilateral     Sedation: Valium      Timeframe: As soon as schedule allows.    Where will this procedure be performed?:   [facility]   TRIGGER POINT INJECTION    Area: Buttocks region (gluteal area) Indications: Piriformis muscle pain; Bilateral (G57.03) piriformis-syndrome; piriformis muscle spasms (X52.841). CPT code: 32440    Scheduling Instructions:     Bilateral Piriformis TPI    Where will this procedure be performed?:   [facility]   CLUNEAL NERVE BLOCK    Standing Status:   Future    Expected Date:   09/30/2023    Expiration Date:   12/12/2023    Scheduling Instructions:     Side: Bilateral     Sedation: PO Valium     Where will this procedure be performed?:   [facility]   Follow-up plan:   Return in about 18 days (around 09/30/2023) for B/L cluneal NB, SIJ, Piriformis, in clinic (PO Valium  5mg ).    Recent Visits Date Type Provider Dept  06/20/23 Office Visit Cephus Collin, MD [facility]  Showing recent visits within past 90 days and meeting all other requirements Today's Visits Date Type Provider Dept  09/12/23 Office Visit Cephus Collin, MD [facility]  Showing today's visits and meeting all other requirements Future Appointments No visits were found meeting these  conditions. Showing future appointments within next 90 days and meeting all other requirements  I discussed the assessment and treatment plan with the patient. The patient was provided an opportunity to ask questions and all were answered. The patient agreed with the plan and demonstrated an understanding of the instructions.  Patient advised to call back or seek an in-person evaluation if the symptoms or condition worsens.  Duration of encounter: .  Total time on encounter, as per AMA guidelines included both the face-to-face and non-face-to-face time personally spent by the physician and/or other qualified health care professional(s) on the day of the encounter (includes time  in activities that require the physician or other qualified health care professional and does not include time in activities normally performed by clinical staff). Physician's time may include the following activities when performed: Preparing to see the patient (e.g., pre-charting review of records, searching for previously ordered imaging, lab work, and nerve conduction tests) Review of prior analgesic pharmacotherapies. Reviewing PMP Interpreting ordered tests (e.g., lab work, imaging, nerve conduction tests) Performing post-procedure evaluations, including interpretation of diagnostic procedures Obtaining and/or reviewing separately obtained history Performing a medically appropriate examination and/or evaluation Counseling and educating the patient/family/caregiver Ordering medications, tests, or procedures Referring and communicating with other health care professionals (when not separately reported) Documenting clinical information in the electronic or other health record Independently interpreting results (not separately reported) and communicating results to the patient/ family/caregiver Care coordination (not separately reported)  Note by: Cephus Collin, MD (TTS and AI technology used. I apologize for  any typographical errors that were not detected and corrected.) Date: 09/12/2023; Time: 3:43 PM

## 2023-09-19 ENCOUNTER — Encounter: Payer: Self-pay | Admitting: Urology

## 2023-09-19 ENCOUNTER — Ambulatory Visit: Admitting: Urology

## 2023-09-19 VITALS — BP 153/63 | HR 56

## 2023-09-19 DIAGNOSIS — N138 Other obstructive and reflux uropathy: Secondary | ICD-10-CM | POA: Diagnosis not present

## 2023-09-19 DIAGNOSIS — N3281 Overactive bladder: Secondary | ICD-10-CM

## 2023-09-19 DIAGNOSIS — R351 Nocturia: Secondary | ICD-10-CM

## 2023-09-19 DIAGNOSIS — N401 Enlarged prostate with lower urinary tract symptoms: Secondary | ICD-10-CM

## 2023-09-19 LAB — BLADDER SCAN AMB NON-IMAGING: Scan Result: 33

## 2023-09-19 MED ORDER — GEMTESA 75 MG PO TABS: 75.0000 mg | ORAL_TABLET | Freq: Every day | ORAL | Status: AC

## 2023-09-19 NOTE — Progress Notes (Signed)
09/19/2023 11:41 AM   [name] 1950/09/19 [id number]  Referring provider: Laneta Pintos, MD [address] [place],  [place] [address]  Urological history: 1. BPH with LU TS - Tamsulosin  0.4 mg daily  2. OAB - failed oxybutynin     Chief Complaint  Patient presents with   Nocturia   HPI: Andrew Townsend is a 73 y.o. man who presents today for 6-week follow-up after a trial of Flomax  for nocturia.  Previous records reviewed.   He saw Dr. Estanislao Heimlich on August 01, 2023 for the complaint of sensation of incomplete emptying and difficulty voiding and nocturia 2-4 times at night.  He had been on oxybutynin  XL 5 mg daily, but the nocturia persisted.  I PSS 17/3  PVR 33 mL   He continues to have nocturia getting up to 4 times a night.  He also is continuously sipping iced tea and water throughout the night because his mouth is dry.  He states he sleeps alone so he is not sure if he snores.  He noticed no difference with the tamsulosin  0.4 mg daily.    IPSS     Row Name 09/19/23 1100         International Prostate Symptom Score   How often have you had the sensation of not emptying your bladder? Less than half the time     How often have you had to urinate less than every two hours? About half the time     How often have you found you stopped and started again several times when you urinated? About half the time     How often have you found it difficult to postpone urination? Less than 1 in 5 times     How often have you had a weak urinary stream? Less than 1 in 5 times     How often have you had to strain to start urination? About half the time     How many times did you typically get up at night to urinate? 4 Times     Total IPSS Score 17       Quality of Life due to urinary symptoms   If you were to spend the rest of your life with your urinary condition just the way it is now how would you feel about that? Mixed              Score:  1-7 Mild 8-19 Moderate 20-35  Severe   PMH: 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)    Medtronic   Myocardial infarction (HCC)    Stroke [facility])     Surgical History: Past Surgical History:  Procedure Laterality Date   arm surgery Right    broken - pins placed but now removed   COLONOSCOPY     CORONARY ARTERY BYPASS GRAFT  in [place]   EYE SURGERY Bilateral    remove cataracts   HERNIA REPAIR     ICD IMPLANT     Medtronic   INGUINAL HERNIA REPAIR Bilateral 08/19/2023   Procedure: OPEN BILATERAL INGUINAL HERNIA REPAIR WITH MESH;  Surgeon: Shela Derby, MD;  Location: [facility] OR;  Service: General;  Laterality: Bilateral;   JOINT REPLACEMENT Right    shoulder in [place]   MANDIBLE SURGERY     lower jaw surgery for placement of dentures per patient   MULTIPLE TOOTH EXTRACTIONS     all teeth pulled - dentures   TONSILLECTOMY     UPPER GI ENDOSCOPY     WISDOM TOOTH EXTRACTION      Home Medications:  Allergies as of 09/19/2023   No Known Allergies      Medication List        Accurate as of Sep 19, 2023 11:41 AM. If you have any questions, ask your nurse or doctor.          albuterol  (2.5 MG/3ML) 0.083% nebulizer solution Commonly known as: PROVENTIL  INHALE THE CONTENTS OF 1 VIAL VIA NEBULIZER EVERY 6 HOURS AS NEEDED FOR WHEEZING OR SHORTNESS OF BREATH.   albuterol  108 (90 Base) MCG/ACT inhaler Commonly known as:  VENTOLIN  HFA Inhale 2 puffs into the lungs every 6 (six) hours as needed for wheezing or shortness of breath.   allopurinol  100 MG tablet Commonly known as: ZYLOPRIM  Take 1 tablet (100 mg total) by mouth daily.   buprenorphine  20 MCG/HR Ptwk Commonly known as: BUTRANS  Place 1 patch onto the skin once a week. Start taking on: Sep 22, 2023   DIASENSE MAGNESIUM PO Take 1,000 mg by mouth daily at 6 (six) AM.   docusate sodium 100 MG capsule Commonly known as: COLACE Take 200 mg by mouth daily as needed for mild constipation.   Eliquis  2.5 MG Tabs tablet Generic drug: apixaban  TAKE 1 TABLET BY MOUTH 2 TIMES DAILY.   ezetimibe  10 MG tablet Commonly known as: ZETIA  Take 1 tablet (10 mg total) by mouth daily.   furosemide  40 MG tablet Commonly known as: LASIX  Take 1 tablet (40 mg total) by mouth daily as needed for fluid.   gabapentin  600 MG tablet Commonly known as: Neurontin  Take 2 tablets (1,200 mg total) by mouth at bedtime.   Gemtesa  75 MG Tabs Generic drug: Vibegron  Take 1 tablet (75 mg total) by mouth daily.   metoprolol  succinate 100 MG 24 hr tablet Commonly known as: TOPROL -XL Take 1 tablet (100 mg total) by mouth daily. PLEASE NOTE DECREASED DOSE.   multivitamin tablet Take 1 tablet by mouth daily.   nitroGLYCERIN  0.4 MG SL tablet Commonly known as: NITROSTAT  Place 1 tablet (0.4 mg total) under the tongue every 5 (five) minutes as needed for chest pain. If second dose is needed to relieve chest pain, seek immediate emergency medical attention.   omeprazole  20 MG capsule Commonly known as: PRILOSEC Take 1 capsule (20 mg total) by mouth daily.   oxyCODONE -acetaminophen  10-325 MG tablet Commonly known as: PERCOCET Take 1 tablet by mouth every 8 (eight) hours as needed for pain. Start taking on: Sep 22, 2023   oxyCODONE -acetaminophen  10-325 MG tablet Commonly known as: PERCOCET Take 1 tablet by mouth every 8 (eight) hours as needed for pain. Start taking on:  October 22, 2023   oxyCODONE -acetaminophen  10-325 MG tablet Commonly known as: PERCOCET Take 1 tablet by mouth every 8 (eight) hours as needed for pain. Start taking on: November 21, 2023   potassium chloride  10 MEQ tablet Commonly known as: KLOR-CON  M TAKE 1 TABLET (10 MEQ TOTAL) BY MOUTH ONCE DAILY. DO NOT CRUSH OR CHEW.   ramelteon  8 MG tablet Commonly known as: ROZEREM  Take 1 tablet (8 mg total) by mouth at bedtime.   rosuvastatin  40 MG tablet Commonly known as: CRESTOR  Take 1 tablet (40 mg total) by mouth at bedtime.   tamsulosin  0.4 MG Caps capsule Commonly known as: FLOMAX  Take 1 capsule (0.4 mg total) by mouth daily.   tiZANidine  4 MG tablet Commonly known as: ZANAFLEX  Take 1 tablet (4 mg total) by mouth every 8 (eight) hours as needed.   Trelegy Ellipta  100-62.5-25 MCG/ACT Aepb Generic drug: Fluticasone-Umeclidin-Vilant Inhale 1 puff into the lungs daily.        Allergies: No Known Allergies  Family History: No family history on file.  Social History:  reports that he quit smoking about 10 years ago. His smoking use included e-cigarettes and cigarettes. He uses smokeless tobacco. He reports that he does not currently use alcohol. He reports that he does not use drugs.  ROS: Pertinent ROS in HPI  Physical Exam: BP (!) 153/63   Pulse (!) 56   Constitutional:  Well nourished. Alert and oriented, No acute distress. HEENT: [place] AT, moist mucus membranes.  Trachea midline Cardiovascular: No clubbing, cyanosis, or edema. Respiratory: Normal respiratory effort, no increased work of breathing. Neurologic: Grossly intact, no focal deficits, moving all 4 extremities. Psychiatric: Normal mood and affect.  Laboratory Data: Lab Results  Component Value Date   WBC 9.8 08/19/2023   HGB 14.3 08/19/2023   HCT 45.6 08/19/2023   MCV 92.5 08/19/2023   PLT 171 08/19/2023   Lab Results  Component Value Date   CREATININE 1.25 (H) 08/19/2023   Lab Results  Component Value  Date   HGBA1C 5.4 07/04/2022   Lab Results  Component Value Date   TSH 3.050 07/04/2022      Component Value Date/Time   CHOL 122 04/17/2023 1027   HDL 47 04/17/2023 1027   CHOLHDL 2.6 04/17/2023 1027   LDLCALC 62 04/17/2023 1027   Lab Results  Component Value Date   AST 26 04/17/2023   Lab Results  Component Value Date   ALT 32 04/17/2023  I have reviewed the labs.   Pertinent Imaging:  09/19/23 11:03  Scan Result 33 ml   Assessment & Plan:    1. BPH with obstruction/lower urinary tract symptoms (Primary) - BLADDER SCAN AMB NON-IMAGING, demonstrates adequate bladder emptying  2. OAB (overactive bladder) - BLADDER SCAN AMB NON-IMAGING - Daytime symptoms not as bothersome  3. Nocturia - Discussed avoiding fluids at nighttime, but his mouth is dry and he needs to continue to drink sips of fluid - Discussed the possibility of having sleep apnea, but he was resistant to this idea - Offered a trial with Gemtesa , but I let him know that I do not feel that this will be successful in treating the nocturia and it is likely that his insurance will not cover the medication and since it is a brand-name medication it may be cost prohibitive - He stated he would like to give it a try - Gemtesa  75 mg #42, samples given  Return in about 6 weeks (around 10/31/2023) for I PSS, PVR .  These notes generated with voice recognition software. I apologize for typographical errors.  Briant Camper  [facility] [address]  [address] [place], [place]  [address] [phone]  

## 2023-09-20 ENCOUNTER — Encounter: Payer: Self-pay | Admitting: Family Medicine

## 2023-09-20 ENCOUNTER — Ambulatory Visit (INDEPENDENT_AMBULATORY_CARE_PROVIDER_SITE_OTHER): Admitting: Family Medicine

## 2023-09-20 VITALS — BP 144/67 | HR 56 | Ht 66.0 in | Wt 128.1 lb

## 2023-09-20 DIAGNOSIS — I701 Atherosclerosis of renal artery: Secondary | ICD-10-CM

## 2023-09-20 DIAGNOSIS — N1832 Chronic kidney disease, stage 3b: Secondary | ICD-10-CM

## 2023-09-20 DIAGNOSIS — I420 Dilated cardiomyopathy: Secondary | ICD-10-CM

## 2023-09-20 DIAGNOSIS — M5416 Radiculopathy, lumbar region: Secondary | ICD-10-CM | POA: Diagnosis not present

## 2023-09-20 DIAGNOSIS — I255 Ischemic cardiomyopathy: Secondary | ICD-10-CM

## 2023-09-20 DIAGNOSIS — J449 Chronic obstructive pulmonary disease, unspecified: Secondary | ICD-10-CM

## 2023-09-20 DIAGNOSIS — M461 Sacroiliitis, not elsewhere classified: Secondary | ICD-10-CM

## 2023-09-20 DIAGNOSIS — I480 Paroxysmal atrial fibrillation: Secondary | ICD-10-CM | POA: Diagnosis not present

## 2023-09-20 MED ORDER — ZOLPIDEM TARTRATE ER 6.25 MG PO TBCR: 6.2500 mg | EXTENDED_RELEASE_TABLET | Freq: Every evening | ORAL | 0 refills | Status: DC | PRN

## 2023-09-20 MED ORDER — EMPAGLIFLOZIN 10 MG PO TABS: 10.0000 mg | ORAL_TABLET | Freq: Every day | ORAL | 3 refills | Status: DC

## 2023-09-20 NOTE — Patient Instructions (Signed)
 It was nice to see you today,  We addressed the following topics today:  -I have sent in Ambien  XR.  Use this instead of the ramelteon . - I have sent in your Jardiance.  It looks like you are still supposed to take this.  I have sent in enough refills for 1 year - I sent in a referral to physical therapy at [facility].  Have a great day,  Etha Henle, MD

## 2023-09-20 NOTE — Progress Notes (Unsigned)
   Established Patient Office Visit  Subjective   Patient ID: Andrew Townsend, male    DOB: 08/12/1950  Age: 73 y.o. MRN: [id number]  Chief Complaint  Patient presents with   Medical Management of Chronic Issues    HPI  Subjective: - Reports hernia operation recently, had previous hernia repair with mesh that slipped - Saw urologist yesterday, prescribed new medication (samples provided due to insurance concerns) - Sleep issues: Ramelteon  only effective for 1.5-1.75 hours before waking - Previously on Ambien  10mg  which worked well, reduced to 5mg  due to age (worked for ~3 hours) - Requesting colonoscopy - had Cologuard ~6 months ago (negative), previous colonoscopy showed polyps - Back pain ongoing, receives injections from Dr. Erminio Hazy with variable effectiveness - Requesting physical therapy referral for back  PMHx: Cardiac issues, diabetes, back problems with spinal curvature  Medications: - Eliquis  ($40/month) -Metoprolol  100mg  - Lasix  (as needed for ankle swelling) - Jardiance (ran out ~mid-January, not refilled) - Inderal and Crestor  for cholesterol - Zanaflex  and oxycodone  (managed by Dr. Erminio Hazy) - Ramelteon  for sleep (not effective)       The ASCVD Risk score (Arnett DK, et al., 2019) failed to calculate for the following reasons:   Risk score cannot be calculated because patient has a medical history suggesting prior/existing ASCVD  Health Maintenance Due  Topic Date Due   Hepatitis C Screening  Never done   COVID-19 Vaccine (6 - 2024-25 season) 03/01/2023      Objective:     BP (!) 144/67   Pulse (!) 56   Ht 5\' 6"  (1.676 m)   Wt 128 lb 1.9 oz (58.1 kg)   SpO2 96%   BMI 20.68 kg/m  {Vitals History (Optional):23777}  Physical Exam   No results found for any visits on 09/20/23.      Assessment & Plan:   There are no diagnoses linked to this encounter.   No follow-ups on file.    Laneta Pintos, MD

## 2023-09-22 NOTE — Assessment & Plan Note (Signed)
 Follows with cardiology.  Continue metoprolol , Crestor , Jardiance, Lasix  as needed

## 2023-09-22 NOTE — Assessment & Plan Note (Addendum)
 Continue albuterol  as needed.  Lungs clear today.

## 2023-09-22 NOTE — Assessment & Plan Note (Signed)
 Sending in new prescription for Jardiance.

## 2023-09-22 NOTE — Assessment & Plan Note (Signed)
 Follows with nephrology.  Continue to monitor kidney function.

## 2023-09-22 NOTE — Assessment & Plan Note (Signed)
 Continue metoprolol and Eliquis

## 2023-09-22 NOTE — Assessment & Plan Note (Signed)
 Follows with pain management.  He is prescribed oxycodone  and Zanaflex .  Sending in another referral for physical therapy.

## 2023-10-01 ENCOUNTER — Telehealth: Payer: Self-pay

## 2023-10-01 NOTE — Telephone Encounter (Signed)
 His insurance denied the [facility]. I am trying to find out why. They only say medically unnecessary on the website. I have sent them a request for the criteria and reason for denial.

## 2023-10-15 ENCOUNTER — Other Ambulatory Visit

## 2023-10-15 ENCOUNTER — Other Ambulatory Visit: Payer: Self-pay | Admitting: *Deleted

## 2023-10-15 DIAGNOSIS — D696 Thrombocytopenia, unspecified: Secondary | ICD-10-CM

## 2023-10-15 DIAGNOSIS — D631 Anemia in chronic kidney disease: Secondary | ICD-10-CM

## 2023-10-15 DIAGNOSIS — M1A39X0 Chronic gout due to renal impairment, multiple sites, without tophus (tophi): Secondary | ICD-10-CM

## 2023-10-15 DIAGNOSIS — I701 Atherosclerosis of renal artery: Secondary | ICD-10-CM

## 2023-10-15 DIAGNOSIS — J449 Chronic obstructive pulmonary disease, unspecified: Secondary | ICD-10-CM

## 2023-10-15 DIAGNOSIS — I255 Ischemic cardiomyopathy: Secondary | ICD-10-CM

## 2023-10-15 DIAGNOSIS — I25119 Atherosclerotic heart disease of native coronary artery with unspecified angina pectoris: Secondary | ICD-10-CM

## 2023-10-15 DIAGNOSIS — N1832 Chronic kidney disease, stage 3b: Secondary | ICD-10-CM

## 2023-10-15 DIAGNOSIS — I480 Paroxysmal atrial fibrillation: Secondary | ICD-10-CM

## 2023-10-16 ENCOUNTER — Telehealth: Payer: Self-pay

## 2023-10-16 LAB — PTH, INTACT AND CALCIUM: PTH: 31 pg/mL (ref 15–65)

## 2023-10-16 LAB — URINALYSIS
Bilirubin, UA: NEGATIVE
Ketones, UA: NEGATIVE
Leukocytes,UA: NEGATIVE
Nitrite, UA: NEGATIVE
Specific Gravity, UA: 1.019 (ref 1.005–1.030)
Urobilinogen, Ur: 1 mg/dL (ref 0.2–1.0)
pH, UA: 6 (ref 5.0–7.5)

## 2023-10-16 LAB — RENAL FUNCTION PANEL
Albumin: 4.1 g/dL (ref 3.8–4.8)
BUN/Creatinine Ratio: 10 (ref 10–24)
BUN: 25 mg/dL (ref 8–27)
CO2: 17 mmol/L — ABNORMAL LOW (ref 20–29)
Calcium: 9.1 mg/dL (ref 8.6–10.2)
Chloride: 108 mmol/L — ABNORMAL HIGH (ref 96–106)
Creatinine, Ser: 2.63 mg/dL — ABNORMAL HIGH (ref 0.76–1.27)
Glucose: 128 mg/dL — ABNORMAL HIGH (ref 70–99)
Phosphorus: 3.2 mg/dL (ref 2.8–4.1)
Potassium: 3.6 mmol/L (ref 3.5–5.2)
Sodium: 144 mmol/L (ref 134–144)
eGFR: 25 mL/min/{1.73_m2} — ABNORMAL LOW (ref >59)

## 2023-10-16 LAB — CBC WITH DIFFERENTIAL/PLATELET
Basophils Absolute: 0.1 10*3/uL (ref 0.0–0.2)
Basos: 1 %
EOS (ABSOLUTE): 0.1 10*3/uL (ref 0.0–0.4)
Eos: 1 %
Hematocrit: 43.3 % (ref 37.5–51.0)
Hemoglobin: 13.6 g/dL (ref 13.0–17.7)
Immature Grans (Abs): 0 10*3/uL (ref 0.0–0.1)
Immature Granulocytes: 0 %
Lymphocytes Absolute: 1.4 10*3/uL (ref 0.7–3.1)
Lymphs: 17 %
MCH: 29.4 pg (ref 26.6–33.0)
MCHC: 31.4 g/dL — ABNORMAL LOW (ref 31.5–35.7)
MCV: 94 fL (ref 79–97)
Monocytes Absolute: 0.7 10*3/uL (ref 0.1–0.9)
Monocytes: 8 %
Neutrophils Absolute: 5.9 10*3/uL (ref 1.4–7.0)
Neutrophils: 73 %
Platelets: 182 10*3/uL (ref 150–450)
RBC: 4.62 x10E6/uL (ref 4.14–5.80)
RDW: 13.9 % (ref 11.6–15.4)
WBC: 8.1 10*3/uL (ref 3.4–10.8)

## 2023-10-16 LAB — PROTEIN / CREATININE RATIO, URINE
Creatinine, Urine: 67.1 mg/dL
Protein, Ur: 116.5 mg/dL
Protein/Creat Ratio: 1736 mg/g{creat} — ABNORMAL HIGH (ref 0–200)

## 2023-10-16 LAB — URIC ACID: Uric Acid: 3.6 mg/dL — ABNORMAL LOW (ref 3.8–8.4)

## 2023-10-16 NOTE — Telephone Encounter (Addendum)
 Alert received from [employer]:  Alert remote transmission: AF>6 hrs 178 AF events for 6%, longest x 11 hrs. On low dose Eliquis .  823 NSVT and some SVT wavelet events, irregular R-R and regular R-R. 1 VT event on 5/26 x 7 sec with V-rate 214 bpm treated with one sequence of ATP.   Outreach made to Pt. He states he does remember feeling his heart "beating faster" on Oct 14, 2023.  He denies any other symptoms.  States he is taking Toprol  XL 100 mg one tablet by mouth daily.    Advised would have Dr. Carolynne Citron review and return his call with further recommendations.

## 2023-10-17 ENCOUNTER — Ambulatory Visit: Attending: Internal Medicine

## 2023-10-17 ENCOUNTER — Ambulatory Visit: Payer: Self-pay | Admitting: Family Medicine

## 2023-10-17 DIAGNOSIS — I255 Ischemic cardiomyopathy: Secondary | ICD-10-CM

## 2023-10-17 NOTE — Patient Instructions (Signed)
 Follow up as scheduled.

## 2023-10-17 NOTE — Telephone Encounter (Signed)
-----   Message from Laneta Pintos sent at 10/17/2023 12:04 PM EDT ----- Can you call the patient and ask him if he has been taking the jardiance  and if so what day did he start? And can you ask him if he knows the name of the medication his urologist recently started him on and if there are any other new medications he has started recently.      Let him know that his kidney function test was elevated when compared to the previous result I have from about one year ago.  He should let his nephrologist know what his most recent creatinine level was (2.63) and I would like him to come back tomorrow or early next week for repeat of his kidney function labs.

## 2023-10-17 NOTE — Telephone Encounter (Signed)
 Outreach made to Pt.  Advised per Dr. Carolynne Citron Pt did have VT event successfully treated with VT.  Advised Pt he may not drive for 6 months per [place] DMV.  Pt indicates understanding.  Pt coming to device clinic today for reprogramming.

## 2023-10-17 NOTE — Telephone Encounter (Signed)
 Reviewed with Dr. Carolynne Citron.  VT episode treated with ATP confirmed.  Per Dr. Taylor-continue all medications as ordered.  No labwork needed.  Will reprogram VT zones as follows: VT zone- 171-214 bpm with 4 bursts and 4 ramps then 15J, 25J, 35J x 3 Add FVT zone- 214-250 bpm with 2 bursts and 2 ramps then 15J, 25J, 35J x 3 VF zone- 250+ ATP during charging then 25J, 35J x 5  Per Dr. Domnick Frisk urgency to bring Pt in for reprogramming.  May reprogram at next scheduled visit.

## 2023-10-17 NOTE — Progress Notes (Signed)
 Pt seen as add on in device clinic for HV therapy reprogramming.  See phone note.

## 2023-10-17 NOTE — Progress Notes (Signed)
 Ok, I will see what the creatinine is when we recheck it before adjusting anything.

## 2023-10-17 NOTE — Telephone Encounter (Signed)
 Pt has been taking the Jardiance . Pt thinks about 1 month or 1.5 months ago.   The medication started from Urologist is Vibegron  (GEMTESA ) 75 MG TABS. This is the only new medication.  Pt is willing to come tomorrow for lab.

## 2023-10-18 ENCOUNTER — Other Ambulatory Visit

## 2023-10-18 DIAGNOSIS — R3129 Other microscopic hematuria: Secondary | ICD-10-CM

## 2023-10-18 DIAGNOSIS — N1832 Chronic kidney disease, stage 3b: Secondary | ICD-10-CM

## 2023-10-18 NOTE — Addendum Note (Signed)
 Addended by: Laneta Pintos on: 10/18/2023 09:07 AM   Modules accepted: Orders

## 2023-10-18 NOTE — Addendum Note (Signed)
 Addended by: Laneta Pintos on: 10/18/2023 08:51 AM   Modules accepted: Orders

## 2023-10-19 LAB — URINALYSIS, MICROSCOPIC ONLY
Bacteria, UA: NONE SEEN
Casts: NONE SEEN /LPF
RBC, Urine: NONE SEEN /HPF (ref 0–2)

## 2023-10-19 LAB — RENAL FUNCTION PANEL
Albumin: 3.9 g/dL (ref 3.8–4.8)
BUN/Creatinine Ratio: 10 (ref 10–24)
BUN: 23 mg/dL (ref 8–27)
CO2: 15 mmol/L — ABNORMAL LOW (ref 20–29)
Calcium: 9 mg/dL (ref 8.6–10.2)
Chloride: 103 mmol/L (ref 96–106)
Creatinine, Ser: 2.41 mg/dL — ABNORMAL HIGH (ref 0.76–1.27)
Glucose: 77 mg/dL (ref 70–99)
Phosphorus: 3.6 mg/dL (ref 2.8–4.1)
Potassium: 3.6 mmol/L (ref 3.5–5.2)
Sodium: 142 mmol/L (ref 134–144)
eGFR: 28 mL/min/{1.73_m2} — ABNORMAL LOW (ref >59)

## 2023-10-19 LAB — CYSTATIN C: CYSTATIN C: 1.94 mg/L — ABNORMAL HIGH (ref 0.78–1.15)

## 2023-10-20 ENCOUNTER — Ambulatory Visit: Payer: Self-pay | Admitting: Family Medicine

## 2023-10-20 LAB — URINE CULTURE: Organism ID, Bacteria: NO GROWTH

## 2023-10-21 ENCOUNTER — Other Ambulatory Visit: Payer: Self-pay | Admitting: Family Medicine

## 2023-10-21 DIAGNOSIS — M9905 Segmental and somatic dysfunction of pelvic region: Secondary | ICD-10-CM | POA: Diagnosis not present

## 2023-10-21 DIAGNOSIS — M5116 Intervertebral disc disorders with radiculopathy, lumbar region: Secondary | ICD-10-CM | POA: Diagnosis not present

## 2023-10-21 DIAGNOSIS — M9903 Segmental and somatic dysfunction of lumbar region: Secondary | ICD-10-CM | POA: Diagnosis not present

## 2023-10-21 DIAGNOSIS — M25551 Pain in right hip: Secondary | ICD-10-CM | POA: Diagnosis not present

## 2023-10-23 ENCOUNTER — Other Ambulatory Visit: Payer: Self-pay | Admitting: Family Medicine

## 2023-10-23 ENCOUNTER — Other Ambulatory Visit: Payer: Self-pay | Admitting: Nephrology

## 2023-10-23 DIAGNOSIS — N1832 Chronic kidney disease, stage 3b: Secondary | ICD-10-CM

## 2023-10-23 DIAGNOSIS — I25119 Atherosclerotic heart disease of native coronary artery with unspecified angina pectoris: Secondary | ICD-10-CM | POA: Diagnosis not present

## 2023-10-23 DIAGNOSIS — I639 Cerebral infarction, unspecified: Secondary | ICD-10-CM | POA: Diagnosis not present

## 2023-10-23 DIAGNOSIS — E785 Hyperlipidemia, unspecified: Secondary | ICD-10-CM | POA: Diagnosis not present

## 2023-10-23 DIAGNOSIS — J449 Chronic obstructive pulmonary disease, unspecified: Secondary | ICD-10-CM

## 2023-10-23 DIAGNOSIS — N200 Calculus of kidney: Secondary | ICD-10-CM

## 2023-10-23 DIAGNOSIS — E875 Hyperkalemia: Secondary | ICD-10-CM

## 2023-10-23 DIAGNOSIS — I10 Essential (primary) hypertension: Secondary | ICD-10-CM | POA: Diagnosis not present

## 2023-10-23 DIAGNOSIS — D631 Anemia in chronic kidney disease: Secondary | ICD-10-CM

## 2023-10-23 DIAGNOSIS — M1000 Idiopathic gout, unspecified site: Secondary | ICD-10-CM | POA: Diagnosis not present

## 2023-10-23 DIAGNOSIS — M5459 Other low back pain: Secondary | ICD-10-CM | POA: Diagnosis not present

## 2023-10-23 DIAGNOSIS — N178 Other acute kidney failure: Secondary | ICD-10-CM

## 2023-10-23 DIAGNOSIS — N400 Enlarged prostate without lower urinary tract symptoms: Secondary | ICD-10-CM | POA: Diagnosis not present

## 2023-10-24 ENCOUNTER — Ambulatory Visit (HOSPITAL_COMMUNITY)
Admission: RE | Admit: 2023-10-24 | Discharge: 2023-10-24 | Disposition: A | Discharge status: Home / Self Care | Source: Ambulatory Visit | Attending: Nephrology | Admitting: Nephrology

## 2023-10-24 DIAGNOSIS — N179 Acute kidney failure, unspecified: Secondary | ICD-10-CM | POA: Diagnosis not present

## 2023-10-24 DIAGNOSIS — N178 Other acute kidney failure: Secondary | ICD-10-CM | POA: Insufficient documentation

## 2023-10-24 DIAGNOSIS — E875 Hyperkalemia: Secondary | ICD-10-CM | POA: Insufficient documentation

## 2023-10-24 DIAGNOSIS — N1832 Chronic kidney disease, stage 3b: Secondary | ICD-10-CM | POA: Diagnosis not present

## 2023-10-24 DIAGNOSIS — D631 Anemia in chronic kidney disease: Secondary | ICD-10-CM | POA: Insufficient documentation

## 2023-10-24 DIAGNOSIS — N200 Calculus of kidney: Secondary | ICD-10-CM | POA: Insufficient documentation

## 2023-10-24 NOTE — Progress Notes (Signed)
 Remote ICD transmission.

## 2023-10-29 ENCOUNTER — Ambulatory Visit (HOSPITAL_BASED_OUTPATIENT_CLINIC_OR_DEPARTMENT_OTHER): Attending: Family Medicine | Admitting: Physical Therapy

## 2023-10-29 ENCOUNTER — Encounter (HOSPITAL_BASED_OUTPATIENT_CLINIC_OR_DEPARTMENT_OTHER): Payer: Self-pay | Admitting: Physical Therapy

## 2023-10-29 ENCOUNTER — Other Ambulatory Visit: Payer: Self-pay

## 2023-10-29 DIAGNOSIS — M5416 Radiculopathy, lumbar region: Secondary | ICD-10-CM | POA: Insufficient documentation

## 2023-10-29 DIAGNOSIS — M25562 Pain in left knee: Secondary | ICD-10-CM | POA: Insufficient documentation

## 2023-10-29 DIAGNOSIS — R262 Difficulty in walking, not elsewhere classified: Secondary | ICD-10-CM | POA: Insufficient documentation

## 2023-10-29 DIAGNOSIS — M6281 Muscle weakness (generalized): Secondary | ICD-10-CM | POA: Diagnosis not present

## 2023-10-29 DIAGNOSIS — M545 Low back pain, unspecified: Secondary | ICD-10-CM | POA: Insufficient documentation

## 2023-10-29 NOTE — Therapy (Signed)
 OUTPATIENT PHYSICAL THERAPY THORACOLUMBAR EVALUATION   Patient Name: Andrew Townsend MRN: [id number] DOB:01/14/1951, 73 y.o., male Today's Date: 10/29/2023  END OF SESSION:  PT End of Session - 10/29/23 1058     Visit Number 1    Number of Visits 18    Date for PT Re-Evaluation 01/27/24    Authorization Type [employer] MCR    PT Start Time 1100    PT Stop Time 1145    PT Time Calculation (min) 45 min    Activity Tolerance Patient tolerated treatment well;Patient limited by pain    Behavior During Therapy WFL for tasks assessed/performed             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)    Medtronic   Myocardial infarction (HCC)    Stroke (HCC)    Past Surgical History:  Procedure Laterality Date   arm surgery Right    broken - pins placed but now removed   COLONOSCOPY     CORONARY ARTERY BYPASS GRAFT     in [place]   EYE SURGERY Bilateral    remove cataracts   HERNIA REPAIR     ICD IMPLANT     Medtronic   INGUINAL HERNIA REPAIR Bilateral 08/19/2023   Procedure: OPEN BILATERAL INGUINAL HERNIA REPAIR WITH MESH;  Surgeon: Shela Derby, MD;  Location: [facility] OR;  Service: General;  Laterality: Bilateral;   JOINT REPLACEMENT Right    shoulder in [place]   MANDIBLE SURGERY     lower jaw surgery for placement of dentures per patient   MULTIPLE TOOTH EXTRACTIONS     all teeth pulled - dentures    TONSILLECTOMY     UPPER GI ENDOSCOPY     WISDOM TOOTH EXTRACTION     Patient Active Problem List   Diagnosis Date Noted   COVID 07/22/2023   Bilateral recurrent inguinal hernia without obstruction or gangrene 05/09/2023   Lumbar radiculopathy 03/26/2023   Diplopia 06/26/2022   Hyperkalemia 04/03/2022   Paroxysmal atrial fibrillation (HCC) 02/11/2022   Cerebral infarction, unspecified (HCC) 01/30/2022   Other idiopathic scoliosis, thoracolumbar region 12/21/2021   Arthritis of sacroiliac joint of both sides (HCC) 12/21/2021   Piriformis syndrome of both sides 12/21/2021   Hx of shoulder surgery (left rotator cuff surgery, right rotator cuff then reverse total shoulder arthoplasty) 12/07/2021   Lumbar facet arthropathy 12/07/2021   Bilateral primary osteoarthritis of knee 12/07/2021   Sacroiliac joint pain 12/07/2021   Gastroesophageal reflux disease 10/27/2021   S/P CABG (coronary artery bypass graft) 05/26/2021   ICD (implantable cardioverter-defibrillator) in place 05/26/2021   COPD (chronic obstructive pulmonary disease) (HCC) 05/26/2021   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   Insomnia 05/26/2021   Atherosclerotic heart disease of native coronary artery with unspecified angina pectoris (HCC) 05/26/2021   BPH (benign prostatic hyperplasia) 03/25/2021   Stage 3b chronic kidney  disease (HCC) 06/16/2018   Hypertension 04/03/2018   Hyperlipidemia 04/03/2018   Chronic pain 04/03/2018   Chronic systolic congestive heart failure (HCC) 04/03/2018   Gout 04/03/2018   Renal artery stenosis (HCC) 04/03/2018   Thrombocytopenia (HCC) 04/03/2018   Anemia in chronic kidney disease 04/03/2018   Ischemic dilated cardiomyopathy (HCC) 06/29/2016   VT (ventricular tachycardia) (HCC) 06/28/2016    PCP:  Laneta Pintos, MD     REFERRING PROVIDER:  Laneta Pintos, MD     REFERRING DIAG:  M54.16 (ICD-10-CM) - Lumbar radiculopathy      Rationale for Evaluation and Treatment:  Rehabilitation  THERAPY DIAG:  Pain, lumbar region  Muscle weakness (generalized)  Difficulty walking  ONSET DATE: >10 yrs  SUBJECTIVE:                                                                                                                                                                                           SUBJECTIVE STATEMENT: Pt reports LBP and R buttocks pain. Injections into the back and piriformis are "hit or miss" in terms of relief. They are too expensive. Pt does have a [employer] membership. Pt has been dealing with this condition for quite some time with multiple episodes of PT and chiro. Pain will down into the posterior and will be just above the knee and will start having R calf cramps. Pt is unable to walk a mile like he used to , he is only down to .25 miles now with shooting. Pt is limited to 15 mins of sitting before pain starts. Denies cancer red flags.   PERTINENT HISTORY:  CHF, CAD, COPD, HTN, CKD, recent hernia repair, R TSA,   PAIN:  Are you having pain? Yes: NPRS scale: 6/10 consistently Pain location: R QL, middle lower back, sacral/glute on R> L Pain description: sharp Aggravating factors: extension, sitting for too long,   Relieving factors: chiro, resting, meds, movement, lumbar support   PRECAUTIONS: ICD/Pacemaker last shocked registered on Oct 14, 2023  RED FLAGS: None   WEIGHT BEARING RESTRICTIONS: No  FALLS:  Has patient fallen in last 6 months? No  LIVING ENVIRONMENT: Lives with: lives with their family, sister Lives in: House/apartment Stairs: to enter Has following equipment at home: has walking stick and walker available at home, does not use   OCCUPATION: retired   PLOF: Independent with basic ADLs  PATIENT GOALS: relieve back pain; wants to hike, wants to build out his mini camper to travel   OBJECTIVE:  Note: Objective measures were completed at Evaluation unless otherwise noted.  DIAGNOSTIC FINDINGS:    Moderate  atherosclerotic calcifications within the  abdominal aorta. A vascular stent overlies the L1-2 disc level.   IMPRESSION: 1. Moderate to severe levocurvature centered at L2. 2. Severe multilevel degenerative disc and endplate changes. 3. Mild retrolisthesis of L1 on L2.  PATIENT SURVEYS:  ODI: ODI Score = 21 points (42%)  Interpretation: 41% to 60% (severe disability): Pain is a primary problem for these patients, but they may also be experiencing significant problems in travel, personal care, social life, sexual activity and sleep. A detailed evaluation is appropriate.  COGNITION:   Overall cognitive status: WFL              SENSATION: NT into R thigh, neuropathy into distal LE     OBSERVATION:  R SB,    PALPATION: TTP and hypertonicity of bilat lumbar paraspinals and QL with R > L  Lumbar joint stiffness into extension and rotation with UPA  LUMBAR ROM:   Active  A/PROM  eval  Flexion 75%  Extension 25%  Right lateral flexion 50% p! In low back and glute  Left lateral flexion 50% p! On R in same areas  Right rotation 50%  Left rotation 50%    (Blank rows = not tested)    LOWER EXTREMITY MMT:     MMT R eval L eval  Hip flexion 4-/5 p! 4/5  Hip extension    Hip abduction 4/5 4/5  Hip adduction    Hip internal rotation    Hip external rotation    Knee flexion    Knee extension 4+/5 4+/5  Ankle dorsiflexion      Ankle plantarflexion      Ankle inversion      Ankle eversion       (Blank rows = not tested)     LUMBAR SPECIAL TESTS:    FABER positive Pirformis test positive SIJ compression positive  Slump negative      FUNCTIONAL TESTS: STS requires UE;       GAIT: WFL with noted postural deviations  Observation: scoliotic curves noted in lumbar and thoracic spine                                                                                                                                                                            TODAY'S  TREATMENT                                                                          DATE:  10/29/23  Exercises - Seated Sciatic Nerve Glide  - 2 x daily - 7 x weekly - 2 sets - 10 reps - 3 hold - Seated Quadratus Lumborum Stretch in Chair  - 2 x daily - 7 x weekly - 1 sets - 3 reps - 30 hold - Sit to Stand with Resistance Around Legs  - 1 x daily - 7 x weekly - 2 sets - 10 reps   PATIENT EDUCATION:    Education details: MOI, diagnosis, prognosis, anatomy, exercise progression, DOMS expectations, muscle firing,  envelope of function, HEP, POC   Person educated: Patient Education method: Explanation, Demonstration, Verbal cues, and Handouts Education comprehension: verbalized understanding, returned demonstration, and verbal cues required   HOME EXERCISE PROGRAM:    Access Code: [email or web address] URL: [email or web address] Date: 10/29/2023 Prepared by: Silver Dross     ASSESSMENT:   CLINICAL IMPRESSION:   Patient is a 73 y.o. male who was seen today for physical therapy evaluation and treatment for c/c of LBP. Pt's s/s appear consistent with a lumbar radicular pain with concurrent piriformis syndrome type irritation. Pt's pain is moderately sensitive and irritable with movement. Pt's is more stiffness dominant and strength limited at this time. Pt does respond well to initial HEP. Plan to continue with lumbar  and hip mobility at future sessions. Pt would benefit from continued skilled therapy in order to reach goals and maximize functional lumbopelvic strength and ROM for return to  normalized ADL and household activities.      OBJECTIVE IMPAIRMENTS: Abnormal gait, decreased activity tolerance, decreased balance, decreased coordination, decreased endurance, decreased mobility, difficulty walking, decreased ROM, decreased strength, hypomobility, increased edema, increased fascial restrictions, impaired perceived functional ability, impaired flexibility, improper body mechanics, and  pain.    ACTIVITY LIMITATIONS: carrying, lifting, bending, sitting, standing, squatting, stairs, transfers, and locomotion level   PARTICIPATION LIMITATIONS: meal prep, cleaning, laundry, interpersonal relationship, driving, shopping, community activity, and yard work   PERSONAL FACTORS: PMH - chf, macular degeneration, DM, cervical CA are also affecting patient's functional outcome.    REHAB POTENTIAL: fair   CLINICAL DECISION MAKING: complicated/unstable   EVALUATION COMPLEXITY: moderate     GOALS:   SHORT TERM GOALS: Target date: 12/10/2023     Pt will become independent with HEP in order to demonstrate synthesis of PT education.     Goal status: INITIAL    2. Pt will demonstrate at least a 12.8 improvement in Oswestry Index in order to demonstrate a clinically significant change in LBP and function.    Goal status: INITIAL     LONG TERM GOALS: Target date: 01/27/2024        Pt  will become independent with final HEP in order to demonstrate synthesis of PT education.    Goal status: INITIAL   2.  Pt will demonstrate at least a 25pt improvement in Oswestry Index in order to demonstrate a clinically significant change in LBP and function. Goal status: INITIAL   3.  Pt will be able to demonstrate/report ability to sit/stand/sleep for extended periods of time without pain in order to demonstrate functional improvement and tolerance to static positioning.    Goal status: INITIAL   4. Pt will be able to demonstrate/report ability to walk >25 mins without pain in order to demonstrate functional improvement and tolerance to exercise and community mobility.     Goal status: INITIAL     PLAN:   PLAN:   PT FREQUENCY: 1-2x/week   PT DURATION: 12 weeks  PLANNED INTERVENTIONS: Therapeutic exercises, Therapeutic activity, Neuromuscular re-education, Balance training, Gait training, Patient/Family education, Joint manipulation, Joint mobilization, Stair training,  Orthotic/Fit training, DME instructions, Aquatic Therapy, Dry Needling, Electrical stimulation, Spinal manipulation, Spinal mobilization, Cryotherapy, Moist heat, scar mobilization, Splintting, Taping, Vasopneumatic device, Traction, Ultrasound, Ionotophoresis 4mg /ml Dexamethasone , Manual therapy, and Re-evaluation.   PLAN FOR NEXT SESSION: mobilizations; weight room progression, lumbar mobility   Silver Dross, PT 10/29/2023, 12:40 PM    Referring diagnosis? M54.16 (ICD-10-CM)  Treatment diagnosis? (if different than referring diagnosis) m54.50, m62.81, r26.2  What was this (referring dx) caused by? []  Surgery []  Fall [x]  Ongoing issue [x]  Arthritis []  Other: ____________  Laterality: []  Rt []  Lt [x]  Both  Check all possible CPT codes:      []  97110 (Therapeutic Exercise)  []  92507 (SLP Treatment)  []  97112 (Neuro Re-ed)   []  92526 (Swallowing Treatment)   []  97116 (Gait Training)   []  [id number] (Cognitive Training, 1st 15 minutes) []  97140 (Manual Therapy)   []  16109 (Cognitive Training, each add'l 15 minutes)  []  97530 (Therapeutic Activities)  []  Other, List CPT Code ____________    []  97535 (Self Care)       [x]  All codes above (97110 - 97535)  [x]  97012 (Mechanical Traction)  []  97014 (E-stim Unattended)  [x]  97032 (E-stim manual)  [x]  97033 (Ionto)  []  97035 (Ultrasound)  [x]  97760 (Orthotic Fit) []  97750 (Physical Performance Training) [x]  [id number] (Aquatic Therapy) []  97034 (Contrast Bath) []  [id number] (Paraffin) []  97597 (Wound Care 1st 20 sq cm) []  97598 (Wound Care each add'l 20 sq cm) []  97016 (Vasopneumatic Device) []  [id number] Public affairs consultant) []  [id number] (Prosthetic Training)

## 2023-10-29 NOTE — Progress Notes (Unsigned)
 10/30/2023 12:51 PM   [name] 11-May-1951 [id number]  Referring provider: Laneta Pintos, MD [address] [place],  [place] [address]  Urological history: 1. BPH with LU TS - Tamsulosin  0.4 mg daily  2. OAB - failed oxybutynin    No chief complaint on file.  HPI: Andrew Townsend is a 73 y.o. man who presents today for 6-week follow-up after a trial of Gemtesa  for nocturia.  Previous records reviewed.   I PSS ***  PVR *** mL        Score:  1-7 Mild 8-19 Moderate 20-35 Severe   PMH: 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)    Medtronic   Myocardial infarction (HCC)    Stroke [facility])     Surgical History: Past Surgical History:  Procedure Laterality Date   arm surgery Right    broken - pins placed but now removed   COLONOSCOPY     CORONARY ARTERY BYPASS GRAFT     in [place]   EYE SURGERY Bilateral    remove cataracts   HERNIA REPAIR     ICD IMPLANT     Medtronic   INGUINAL HERNIA REPAIR Bilateral 08/19/2023   Procedure: OPEN BILATERAL INGUINAL HERNIA REPAIR WITH MESH;  Surgeon: Shela Derby, MD;  Location: [facility] OR;  Service: General;  Laterality: Bilateral;   JOINT REPLACEMENT Right    shoulder in [place]   MANDIBLE SURGERY     lower jaw surgery for placement of dentures per patient   MULTIPLE TOOTH EXTRACTIONS     all teeth pulled - dentures   TONSILLECTOMY     UPPER  GI ENDOSCOPY     WISDOM TOOTH EXTRACTION      Home Medications:  Allergies as of 10/30/2023   No Known Allergies      Medication List        Accurate as of October 29, 2023 12:51 PM. If you have any questions, ask your nurse or doctor.          albuterol  (2.5 MG/3ML) 0.083% nebulizer solution Commonly known as: PROVENTIL  INHALE THE CONTENTS OF 1 VIAL VIA NEBULIZER EVERY 6 HOURS AS NEEDED FOR WHEEZING OR SHORTNESS OF BREATH.   albuterol  108 (90 Base) MCG/ACT inhaler Commonly known as: VENTOLIN  HFA Inhale 2 puffs into the lungs every 6 (six) hours as needed for wheezing or shortness of breath.   allopurinol  100 MG tablet Commonly known as: ZYLOPRIM  Take 1 tablet (100 mg total) by mouth daily.   buprenorphine  20 MCG/HR Ptwk Commonly known as: BUTRANS  Place 1 patch onto the skin once a week.   DIASENSE MAGNESIUM PO Take 1,000 mg by mouth daily at 6 (six) AM.   docusate sodium 100 MG capsule Commonly known as: COLACE Take 200 mg by mouth daily as needed for mild constipation.   Eliquis  2.5 MG Tabs tablet  Generic drug: apixaban  TAKE 1 TABLET BY MOUTH 2 TIMES DAILY.   empagliflozin  10 MG Tabs tablet Commonly known as: Jardiance  Take 1 tablet (10 mg total) by mouth daily before breakfast.   ezetimibe  10 MG tablet Commonly known as: ZETIA  Take 1 tablet (10 mg total) by mouth daily.   furosemide  40 MG tablet Commonly known as: LASIX  Take 1 tablet (40 mg total) by mouth daily as needed for fluid.   gabapentin  600 MG tablet Commonly known as: Neurontin  Take 2 tablets (1,200 mg total) by mouth at bedtime.   Gemtesa  75 MG Tabs Generic drug: Vibegron  Take 1 tablet (75 mg total) by mouth daily.   metoprolol  succinate 100 MG 24 hr tablet Commonly known as: TOPROL -XL Take 1 tablet (100 mg total) by mouth daily. PLEASE NOTE DECREASED DOSE.   multivitamin tablet Take 1 tablet by mouth daily.   nitroGLYCERIN  0.4 MG SL tablet Commonly known as: NITROSTAT  Place 1  tablet (0.4 mg total) under the tongue every 5 (five) minutes as needed for chest pain. If second dose is needed to relieve chest pain, seek immediate emergency medical attention.   omeprazole  20 MG capsule Commonly known as: PRILOSEC Take 1 capsule (20 mg total) by mouth daily.   oxyCODONE -acetaminophen  10-325 MG tablet Commonly known as: PERCOCET Take 1 tablet by mouth every 8 (eight) hours as needed for pain.   oxyCODONE -acetaminophen  10-325 MG tablet Commonly known as: PERCOCET Take 1 tablet by mouth every 8 (eight) hours as needed for pain. Start taking on: November 21, 2023   potassium chloride  10 MEQ tablet Commonly known as: KLOR-CON  M TAKE 1 TABLET (10 MEQ TOTAL) BY MOUTH ONCE DAILY. DO NOT CRUSH OR CHEW.   rosuvastatin  40 MG tablet Commonly known as: CRESTOR  Take 1 tablet (40 mg total) by mouth at bedtime.   tiZANidine  4 MG tablet Commonly known as: ZANAFLEX  Take 1 tablet (4 mg total) by mouth every 8 (eight) hours as needed.   Trelegy Ellipta  100-62.5-25 MCG/ACT Aepb Generic drug: Fluticasone-Umeclidin-Vilant INHALE 1 PUFF INTO THE LUNGS DAILY.   zolpidem  6.25 MG CR tablet Commonly known as: AMBIEN  CR TAKE 1 TABLET (6.25 MG TOTAL) BY MOUTH AT BEDTIME AS NEEDED FOR SLEEP.        Allergies: No Known Allergies  Family History: No family history on file.  Social History:  reports that he quit smoking about 10 years ago. His smoking use included e-cigarettes and cigarettes. He uses smokeless tobacco. He reports that he does not currently use alcohol. He reports that he does not use drugs.  ROS: Pertinent ROS in HPI  Physical Exam: There were no vitals taken for this visit.  Constitutional:  Well nourished. Alert and oriented, No acute distress. HEENT: [place] AT, moist mucus membranes.  Trachea midline, no masses. Cardiovascular: No clubbing, cyanosis, or edema. Respiratory: Normal respiratory effort, no increased work of breathing. GI: Abdomen is soft, non tender,  non distended, no abdominal masses. Liver and spleen not palpable.  No hernias appreciated.  Stool sample for occult testing is not indicated.   GU: No CVA tenderness.  No bladder fullness or masses.  Patient with circumcised/uncircumcised phallus. ***Foreskin easily retracted***  Urethral meatus is patent.  No penile discharge. No penile lesions or rashes. Scrotum without lesions, cysts, rashes and/or edema.  Testicles are located scrotally bilaterally. No masses are appreciated in the testicles. Left and right epididymis are normal. Rectal: Patient with  normal sphincter tone. Anus and perineum without scarring or rashes. No rectal masses are  appreciated. Prostate is approximately *** grams, *** nodules are appreciated. Seminal vesicles are normal. Skin: No rashes, bruises or suspicious lesions. Lymph: No cervical or inguinal adenopathy. Neurologic: Grossly intact, no focal deficits, moving all 4 extremities. Psychiatric: Normal mood and affect.   Laboratory Data: CBC and Differential Order: [id number] Component Ref Range & Units 6 d ago  WBC 3.8 - 10.8 Thousand/uL 10.2  RBC 4.20 - 5.80 Million/uL 4.98  Hemoglobin 13.2 - 17.1 g/dL 40.9  Hematocrit 81.1 - 50.0 % 46.3  MCV 80.0 - 100.0 fL 93  MCH 27.0 - 33.0 pg 29.7  MCHC 32.0 - 36.0 g/dL 32  Comment:      For adults, a slight decrease in the calculated MCHC      value (in the range of 30 to 32 g/dL) is most likely      not clinically significant; however, it should be      interpreted with caution in correlation with other      red cell parameters and the patient's clinical      condition.  RDW 11.0 - 15.0 % 14  Platelets 140 - 400 Thousand/uL 223  MPV 7.5 - 12.5 fL 12.4  Neutrophils Absolute 1500 - 7800 cells/uL 7,711  Band Neutrophils Absolute, Manual Count 0 - 750 cells/uL CANCELED  Comment: Result canceled by the ancillary.  Metamyelocytes Absolute 0 cells/uL CANCELED  Comment: Result canceled by the ancillary.   Absolute Myelocytes 0 cells/uL CANCELED  Comment: Result canceled by the ancillary.  Absolute Promyelocytes 0 cells/uL CANCELED  Comment: Result canceled by the ancillary.  Lymphocytes Absolute 850 - 3900 cells/uL 1,622  Monocytes Absolute 200 - 950 cells/uL 643  Eosinophils Absolute 15 - 500 cells/uL 112  Basophils Absolute 0 - 200 cells/uL 112  Blasts Absolute 0 cells/uL CANCELED  Comment: Result canceled by the ancillary.  NRBC Absolute 0 cells/uL CANCELED  Comment: Result canceled by the ancillary.  Neutrophils Relative % 75.6  Bands Absolute % CANCELED  Comment: Result canceled by the ancillary.  Metamyelocytes Percent % CANCELED  Comment: Result canceled by the ancillary.  Myelocytes Relative % CANCELED  Comment: Result canceled by the ancillary.  Promyelocytes Relative % CANCELED  Comment: Result canceled by the ancillary.  Lymphocytes % 15.9  Variant lymphocytes/100 WBC (Bld) 0 - 10 % CANCELED  Comment: Result canceled by the ancillary.  Monocytes % 6.3  Eosinophils % 1.1  Basophils Relative % 1.1  Blasts % CANCELED  Comment: Result canceled by the ancillary.  nRBC 0 /100 WBC CANCELED  Comment: Result canceled by the ancillary.  Comment(s) CANCELED  Comment: Result canceled by the ancillary.  Resulting Agency [facility]   Specimen Collected: 10/23/23 10:33   Performed by: Stacie Dys Last Resulted: 10/24/23 11:44  Received From: [facility]  Result Received: 10/28/23 10:55   Renal Function Panel Order: [id number] Component Ref Range & Units 6 d ago  Glucose 65 - 99 mg/dL 86  Comment:                                                                                             Fasting reference interval  BUN 7 - 25 mg/dL 17  Creatinine 0.98 - 1.19 mg/dL 1.7 High   eGFR CKD-EPI CR 2021 > OR = 60 mL/min/1.12m2 42 Low   BUN/Creatinine Ratio 6 - 22 (calc) 10  Sodium 135 -  146 mmol/L 143  Potassium 3.5 - 5.3 mmol/L 3.7  Chloride 98 - 110 mmol/L 107  Bicarbonate (CO2) 20 - 32 mmol/L 24  Calcium  8.6 - 10.3 mg/dL 9.3  Phosphorus 2.1 - 4.3 mg/dL 3.2  Albumin 3.6 - 5.1 g/dL 3.9  Resulting Agency [facility]   Specimen Collected: 10/23/23 10:33   Performed by: Stacie Dys Last Resulted: 10/24/23 11:44  Received From: [facility]  Result Received: 10/28/23 10:55  I have reviewed the labs.   Pertinent Imaging: ***  1. BPH with obstruction/lower urinary tract symptoms (Primary) - **  2. OAB (overactive bladder) - ***  3. Nocturia - ***  No follow-ups on file.  These notes generated with voice recognition software. I apologize for typographical errors.  Briant Camper  [facility] [address]  [address] [place], [place] [address] [phone]

## 2023-10-30 ENCOUNTER — Encounter: Payer: Self-pay | Admitting: Urology

## 2023-10-30 ENCOUNTER — Ambulatory Visit: Admitting: Urology

## 2023-10-30 DIAGNOSIS — N401 Enlarged prostate with lower urinary tract symptoms: Secondary | ICD-10-CM

## 2023-10-30 DIAGNOSIS — N138 Other obstructive and reflux uropathy: Secondary | ICD-10-CM

## 2023-10-30 DIAGNOSIS — N3281 Overactive bladder: Secondary | ICD-10-CM | POA: Diagnosis not present

## 2023-10-30 DIAGNOSIS — R351 Nocturia: Secondary | ICD-10-CM | POA: Diagnosis not present

## 2023-10-30 LAB — BLADDER SCAN AMB NON-IMAGING: Scan Result: 3

## 2023-10-30 MED ORDER — GEMTESA 75 MG PO TABS
75.0000 mg | ORAL_TABLET | Freq: Every day | ORAL | 11 refills | Status: AC
Start: 2023-10-30 — End: ?

## 2023-10-30 NOTE — Patient Instructions (Signed)
 [facility] [employer]   [phone]

## 2023-10-31 ENCOUNTER — Encounter (HOSPITAL_BASED_OUTPATIENT_CLINIC_OR_DEPARTMENT_OTHER): Payer: Self-pay | Admitting: Physical Therapy

## 2023-10-31 ENCOUNTER — Ambulatory Visit (HOSPITAL_BASED_OUTPATIENT_CLINIC_OR_DEPARTMENT_OTHER): Admitting: Physical Therapy

## 2023-10-31 DIAGNOSIS — M25562 Pain in left knee: Secondary | ICD-10-CM | POA: Diagnosis not present

## 2023-10-31 DIAGNOSIS — R262 Difficulty in walking, not elsewhere classified: Secondary | ICD-10-CM

## 2023-10-31 DIAGNOSIS — M545 Low back pain, unspecified: Secondary | ICD-10-CM | POA: Diagnosis not present

## 2023-10-31 DIAGNOSIS — M6281 Muscle weakness (generalized): Secondary | ICD-10-CM

## 2023-10-31 DIAGNOSIS — M5416 Radiculopathy, lumbar region: Secondary | ICD-10-CM | POA: Diagnosis not present

## 2023-10-31 NOTE — Therapy (Signed)
 OUTPATIENT PHYSICAL THERAPY THORACOLUMBAR EVALUATION   Patient Name: Andrew Townsend MRN: [id number] DOB:29-Jul-1950, 73 y.o., male Today's Date: 10/31/2023  END OF SESSION:  PT End of Session - 10/31/23 1414     Visit Number 2    Number of Visits 18    Date for PT Re-Evaluation 01/27/24    Authorization Type [employer] MCR    PT Start Time 1403    PT Stop Time 1443    PT Time Calculation (min) 40 min    Activity Tolerance Patient tolerated treatment well;Patient limited by pain    Behavior During Therapy WFL for tasks assessed/performed           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)    Medtronic   Myocardial infarction (HCC)    Stroke (HCC)    Past Surgical History:  Procedure Laterality Date   arm surgery Right    broken - pins placed but now removed   COLONOSCOPY     CORONARY ARTERY BYPASS GRAFT     in [place]   EYE SURGERY Bilateral    remove cataracts   HERNIA REPAIR     ICD IMPLANT     Medtronic   INGUINAL HERNIA REPAIR Bilateral 08/19/2023   Procedure: OPEN BILATERAL INGUINAL HERNIA REPAIR WITH MESH;  Surgeon: Shela Derby, MD;  Location: [facility] OR;  Service: General;  Laterality: Bilateral;   JOINT REPLACEMENT Right    shoulder in [place]   MANDIBLE SURGERY     lower jaw surgery for placement of dentures per patient   MULTIPLE TOOTH EXTRACTIONS     all teeth pulled - dentures    TONSILLECTOMY     UPPER GI ENDOSCOPY     WISDOM TOOTH EXTRACTION     Patient Active Problem List   Diagnosis Date Noted   COVID 07/22/2023   Bilateral recurrent inguinal hernia without obstruction or gangrene 05/09/2023   Lumbar radiculopathy 03/26/2023   Diplopia 06/26/2022   Hyperkalemia 04/03/2022   Paroxysmal atrial fibrillation (HCC) 02/11/2022   Cerebral infarction, unspecified (HCC) 01/30/2022   Other idiopathic scoliosis, thoracolumbar region 12/21/2021   Arthritis of sacroiliac joint of both sides (HCC) 12/21/2021   Piriformis syndrome of both sides 12/21/2021   Hx of shoulder surgery (left rotator cuff surgery, right rotator cuff then reverse total shoulder arthoplasty) 12/07/2021   Lumbar facet arthropathy 12/07/2021   Bilateral primary osteoarthritis of knee 12/07/2021   Sacroiliac joint pain 12/07/2021   Gastroesophageal reflux disease 10/27/2021   S/P CABG (coronary artery bypass graft) 05/26/2021   ICD (implantable cardioverter-defibrillator) in place 05/26/2021   COPD (chronic obstructive pulmonary disease) (HCC) 05/26/2021   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   Insomnia 05/26/2021   Atherosclerotic heart disease of native coronary artery with unspecified angina pectoris (HCC) 05/26/2021   BPH (benign prostatic hyperplasia) 03/25/2021   Stage 3b chronic kidney disease (HCC)  06/16/2018   Hypertension 04/03/2018   Hyperlipidemia 04/03/2018   Chronic pain 04/03/2018   Chronic systolic congestive heart failure (HCC) 04/03/2018   Gout 04/03/2018   Renal artery stenosis (HCC) 04/03/2018   Thrombocytopenia (HCC) 04/03/2018   Anemia in chronic kidney disease 04/03/2018   Ischemic dilated cardiomyopathy (HCC) 06/29/2016   VT (ventricular tachycardia) (HCC) 06/28/2016    PCP:  Laneta Pintos, MD     REFERRING PROVIDER:  Laneta Pintos, MD     REFERRING DIAG:  M54.16 (ICD-10-CM) - Lumbar radiculopathy      Rationale for Evaluation and Treatment:  Rehabilitation  THERAPY DIAG:  Pain, lumbar region  Muscle weakness (generalized)  Difficulty walking  ONSET DATE: >10 yrs  SUBJECTIVE:                                                                                                                                                                                           SUBJECTIVE STATEMENT:  Pt notes no significant change from last session. Pt asks about changing frequency of visits.    Eval: Pt reports LBP and R buttocks pain. Injections into the back and piriformis are hit or miss in terms of relief. They are too expensive. Pt does have a [employer] membership. Pt has been dealing with this condition for quite some time with multiple episodes of PT and chiro. Pain will down into the posterior and will be just above the knee and will start having R calf cramps. Pt is unable to walk a mile like he used to , he is only down to .25 miles now with shooting. Pt is limited to 15 mins of sitting before pain starts. Denies cancer red flags.   PERTINENT HISTORY:  CHF, CAD, COPD, HTN, CKD, recent hernia repair, R TSA,   PAIN:  Are you having pain? Yes: NPRS scale: 6/10 consistently Pain location: R QL, middle lower back, sacral/glute on R> L Pain description: sharp Aggravating factors: extension, sitting for too long,   Relieving factors: chiro, resting, meds, movement, lumbar support   PRECAUTIONS: ICD/Pacemaker last shocked registered on Oct 14, 2023  RED FLAGS: None   WEIGHT BEARING RESTRICTIONS: No  FALLS:  Has patient fallen in last 6 months? No  LIVING ENVIRONMENT: Lives with: lives with their family, sister Lives in: House/apartment Stairs: to enter Has following equipment at home: has walking stick and walker available at home, does not use   OCCUPATION: retired   PLOF: Independent with basic ADLs  PATIENT GOALS: relieve back pain; wants to hike, wants to build out his mini camper to travel   OBJECTIVE:  Note:  Objective measures  were completed at Evaluation unless otherwise noted.  DIAGNOSTIC FINDINGS:    Moderate atherosclerotic calcifications within the abdominal aorta. A vascular stent overlies the L1-2 disc level.   IMPRESSION: 1. Moderate to severe levocurvature centered at L2. 2. Severe multilevel degenerative disc and endplate changes. 3. Mild retrolisthesis of L1 on L2.  PATIENT SURVEYS:  ODI: ODI Score = 21 points (42%)  Interpretation: 41% to 60% (severe disability): Pain is a primary problem for these patients, but they may also be experiencing significant problems in travel, personal care, social life, sexual activity and sleep. A detailed evaluation is appropriate.  COGNITION:   Overall cognitive status: WFL              SENSATION: NT into R thigh, neuropathy into distal LE     OBSERVATION:  R SB,    PALPATION: TTP and hypertonicity of bilat lumbar paraspinals and QL with R > L  Lumbar joint stiffness into extension and rotation with UPA  LUMBAR ROM:   Active  A/PROM  eval  Flexion 75%  Extension 25%  Right lateral flexion 50% p! In low back and glute  Left lateral flexion 50% p! On R in same areas  Right rotation 50%  Left rotation 50%    (Blank rows = not tested)    LOWER EXTREMITY MMT:     MMT R eval L eval  Hip flexion 4-/5 p! 4/5  Hip extension    Hip abduction 4/5 4/5  Hip adduction    Hip internal rotation    Hip external rotation    Knee flexion    Knee extension 4+/5 4+/5  Ankle dorsiflexion      Ankle plantarflexion      Ankle inversion      Ankle eversion       (Blank rows = not tested)     LUMBAR SPECIAL TESTS:    FABER positive Pirformis test positive SIJ compression positive  Slump negative      FUNCTIONAL TESTS: STS requires UE;       GAIT: WFL with noted postural deviations  Observation: scoliotic curves noted in lumbar and thoracic spine                                                                                                                                                                             TODAY'S TREATMENT  DATE:    6/12 UPA L2-5 bilat grade III STM bilat paraspinal and QL R LE LAD with lumbar bias 5 min LAD with PPT   LTR 20x R lateral shift correction 2x10 at wall Lumbar flexion seated with L bias 2x10 Postural changes during the day, neck/shoulder/hip positioning  10/29/23  Exercises - Seated Sciatic Nerve Glide  - 2 x daily - 7 x weekly - 2 sets - 10 reps - 3 hold - Seated Quadratus Lumborum Stretch in Chair  - 2 x daily - 7 x weekly - 1 sets - 3 reps - 30 hold - Sit to Stand with Resistance Around Legs  - 1 x daily - 7 x weekly - 2 sets - 10 reps   PATIENT EDUCATION:    Education details: MOI, diagnosis, prognosis, anatomy, exercise progression, DOMS expectations, muscle firing,  envelope of function, HEP, POC   Person educated: Patient Education method: Explanation, Demonstration, Verbal cues, and Handouts Education comprehension: verbalized understanding, returned demonstration, and verbal cues required   HOME EXERCISE PROGRAM:    Access Code: [email or web address] URL: [email or web address] Date: 10/29/2023 Prepared by: Silver Dross     ASSESSMENT:   CLINICAL IMPRESSION:   Pt able to progress lumbar mobility and able to start lateral correction and flexion biased movements today to improve lumbar mobility. Pt advised to move through flexion and extension as pt has been fear avoidant of extension. Pt's significant R SB improves with exercise though is still present due to scoliotic curve at baseline. Pt advised to perform both flexion and extension as well as postural corrections throughout the day to reduce chronic postural strain. Consider thoracic and hip based mobility at next as well. Pt advised on POC frequency and may drop to 1x/week if he does not find manual to be effective in  addition to being compliant with his HEP. Pt would benefit from continued skilled therapy in order to reach goals and maximize functional lumbopelvic strength and ROM for return to  normalized ADL and household activities.      OBJECTIVE IMPAIRMENTS: Abnormal gait, decreased activity tolerance, decreased balance, decreased coordination, decreased endurance, decreased mobility, difficulty walking, decreased ROM, decreased strength, hypomobility, increased edema, increased fascial restrictions, impaired perceived functional ability, impaired flexibility, improper body mechanics, and pain.    ACTIVITY LIMITATIONS: carrying, lifting, bending, sitting, standing, squatting, stairs, transfers, and locomotion level   PARTICIPATION LIMITATIONS: meal prep, cleaning, laundry, interpersonal relationship, driving, shopping, community activity, and yard work   PERSONAL FACTORS: PMH - chf, macular degeneration, DM, cervical CA are also affecting patient's functional outcome.    REHAB POTENTIAL: fair   CLINICAL DECISION MAKING: complicated/unstable   EVALUATION COMPLEXITY: moderate     GOALS:   SHORT TERM GOALS: Target date: 12/10/2023     Pt will become independent with HEP in order to demonstrate synthesis of PT education.     Goal status: INITIAL    2. Pt will demonstrate at least a 12.8 improvement in Oswestry Index in order to demonstrate a clinically significant change in LBP and function.    Goal status: INITIAL     LONG TERM GOALS: Target date: 01/27/2024        Pt  will become independent with final HEP in order to demonstrate synthesis of PT education.    Goal status: INITIAL   2.  Pt will demonstrate at least a 25pt improvement in Oswestry Index in order to demonstrate a clinically significant change in LBP and function. Goal status: INITIAL   3.  Pt will be able to demonstrate/report ability to sit/stand/sleep for extended periods of time without pain in order to demonstrate  functional improvement and tolerance to static positioning.    Goal status: INITIAL   4. Pt will be able to demonstrate/report ability to walk >25 mins without pain in order to demonstrate functional improvement and tolerance to exercise and community mobility.     Goal status: INITIAL     PLAN:   PLAN:   PT FREQUENCY: 1-2x/week   PT DURATION: 12 weeks    PLANNED INTERVENTIONS: Therapeutic exercises, Therapeutic activity, Neuromuscular re-education, Balance training, Gait training, Patient/Family education, Joint manipulation, Joint mobilization, Stair training, Orthotic/Fit training, DME instructions, Aquatic Therapy, Dry Needling, Electrical stimulation, Spinal manipulation, Spinal mobilization, Cryotherapy, Moist heat, scar mobilization, Splintting, Taping, Vasopneumatic device, Traction, Ultrasound, Ionotophoresis 4mg /ml Dexamethasone , Manual therapy, and Re-evaluation.   PLAN FOR NEXT SESSION: mobilizations; weight room progression, lumbar mobility   Silver Dross, PT 10/31/2023, 2:55 PM

## 2023-11-04 ENCOUNTER — Ambulatory Visit (HOSPITAL_BASED_OUTPATIENT_CLINIC_OR_DEPARTMENT_OTHER): Payer: Self-pay | Admitting: Physical Therapy

## 2023-11-04 ENCOUNTER — Encounter (HOSPITAL_BASED_OUTPATIENT_CLINIC_OR_DEPARTMENT_OTHER): Payer: Self-pay | Admitting: Physical Therapy

## 2023-11-04 DIAGNOSIS — M5416 Radiculopathy, lumbar region: Secondary | ICD-10-CM | POA: Diagnosis not present

## 2023-11-04 DIAGNOSIS — M6281 Muscle weakness (generalized): Secondary | ICD-10-CM

## 2023-11-04 DIAGNOSIS — M545 Low back pain, unspecified: Secondary | ICD-10-CM

## 2023-11-04 DIAGNOSIS — R262 Difficulty in walking, not elsewhere classified: Secondary | ICD-10-CM

## 2023-11-04 DIAGNOSIS — M25562 Pain in left knee: Secondary | ICD-10-CM | POA: Diagnosis not present

## 2023-11-04 NOTE — Therapy (Signed)
 OUTPATIENT PHYSICAL THERAPY THORACOLUMBAR EVALUATION   Patient Name: Andrew Townsend MRN: [id number] DOB:1951-04-17, 73 y.o., male Today's Date: 11/04/2023  END OF SESSION:  PT End of Session - 11/04/23 1014     Visit Number 3    Number of Visits 18    Date for PT Re-Evaluation 01/27/24    Authorization Type [employer] MCR    PT Start Time 0935    PT Stop Time 1013    PT Time Calculation (min) 38 min    Activity Tolerance Patient tolerated treatment well;Patient limited by pain    Behavior During Therapy WFL for tasks assessed/performed            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)    Medtronic   Myocardial infarction (HCC)    Stroke (HCC)    Past Surgical History:  Procedure Laterality Date   arm surgery Right    broken - pins placed but now removed   COLONOSCOPY     CORONARY ARTERY BYPASS GRAFT     in [place]   EYE SURGERY Bilateral    remove cataracts   HERNIA REPAIR     ICD IMPLANT     Medtronic   INGUINAL HERNIA REPAIR Bilateral 08/19/2023   Procedure: OPEN BILATERAL INGUINAL HERNIA REPAIR WITH MESH;  Surgeon: Shela Derby, MD;  Location: [facility] OR;  Service: General;  Laterality: Bilateral;   JOINT REPLACEMENT Right    shoulder in [place]   MANDIBLE SURGERY     lower jaw surgery for placement of dentures per patient   MULTIPLE TOOTH EXTRACTIONS     all teeth pulled - dentures    TONSILLECTOMY     UPPER GI ENDOSCOPY     WISDOM TOOTH EXTRACTION     Patient Active Problem List   Diagnosis Date Noted   COVID 07/22/2023   Bilateral recurrent inguinal hernia without obstruction or gangrene 05/09/2023   Lumbar radiculopathy 03/26/2023   Diplopia 06/26/2022   Hyperkalemia 04/03/2022   Paroxysmal atrial fibrillation (HCC) 02/11/2022   Cerebral infarction, unspecified (HCC) 01/30/2022   Other idiopathic scoliosis, thoracolumbar region 12/21/2021   Arthritis of sacroiliac joint of both sides (HCC) 12/21/2021   Piriformis syndrome of both sides 12/21/2021   Hx of shoulder surgery (left rotator cuff surgery, right rotator cuff then reverse total shoulder arthoplasty) 12/07/2021   Lumbar facet arthropathy 12/07/2021   Bilateral primary osteoarthritis of knee 12/07/2021   Sacroiliac joint pain 12/07/2021   Gastroesophageal reflux disease 10/27/2021   S/P CABG (coronary artery bypass graft) 05/26/2021   ICD (implantable cardioverter-defibrillator) in place 05/26/2021   COPD (chronic obstructive pulmonary disease) (HCC) 05/26/2021   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   Insomnia 05/26/2021   Atherosclerotic heart disease of native coronary artery with unspecified angina pectoris (HCC) 05/26/2021   BPH (benign prostatic hyperplasia) 03/25/2021   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) 06/16/2018   Hypertension 04/03/2018   Hyperlipidemia 04/03/2018   Chronic pain 04/03/2018   Chronic systolic congestive heart failure (HCC) 04/03/2018   Gout 04/03/2018   Renal artery stenosis (HCC) 04/03/2018   Thrombocytopenia (HCC) 04/03/2018   Anemia in chronic kidney disease 04/03/2018   Ischemic dilated cardiomyopathy (HCC) 06/29/2016   VT (ventricular tachycardia) (HCC) 06/28/2016    PCP:  Laneta Pintos, MD     REFERRING PROVIDER:  Laneta Pintos, MD     REFERRING DIAG:  [phone] (ICD-10-CM) - Lumbar radiculopathy      Rationale for Evaluation and Treatment:  Rehabilitation  THERAPY DIAG:  Pain, lumbar region  Muscle weakness (generalized)  Difficulty walking  ONSET DATE: >10 yrs  SUBJECTIVE:                                                                                                                                                                                           SUBJECTIVE STATEMENT:  Pt states that he is very sore this morning after all the sanding he had to do over the weekend. Pt denies increase in distal radiating pain. Pt reports he almost called out this morning due to his pain.    Eval: Pt reports LBP and R buttocks pain. Injections into the back and piriformis are hit or miss in terms of relief. They are too expensive. Pt does have a [employer] membership. Pt has been dealing with this condition for quite some time with multiple episodes of PT and chiro. Pain will down into the posterior and will be just above the knee and will start having R calf cramps. Pt is unable to walk a mile like he used to , he is only down to .25 miles now with shooting. Pt is limited to 15 mins of sitting before pain starts. Denies cancer red flags.   PERTINENT HISTORY:  CHF, CAD, COPD, HTN, CKD, recent hernia repair, R TSA,   PAIN:  Are you having pain? Yes: NPRS scale: 7/10 consistently Pain location: R QL, middle lower back, sacral/glute on R> L Pain description: sharp Aggravating factors: extension, sitting for too long,   Relieving factors: chiro, resting, meds, movement, lumbar support   PRECAUTIONS: ICD/Pacemaker last shocked registered on Oct 14, 2023  RED FLAGS: None   WEIGHT BEARING RESTRICTIONS: No  FALLS:  Has patient fallen in last 6 months? No  LIVING ENVIRONMENT: Lives with: lives with their family, sister Lives in: House/apartment Stairs: to enter Has following equipment at home: has walking stick and walker available at home, does not use   OCCUPATION: retired   PLOF: Independent with basic ADLs  PATIENT GOALS:  relieve back pain; wants to hike, wants to build out his mini camper to travel   OBJECTIVE:  Note: Objective measures were completed at Evaluation unless otherwise noted.  DIAGNOSTIC FINDINGS:    Moderate atherosclerotic calcifications within the abdominal aorta. A vascular stent overlies the L1-2 disc level.   IMPRESSION: 1. Moderate to severe levocurvature centered at L2. 2. Severe multilevel degenerative disc and endplate changes. 3. Mild retrolisthesis of L1 on L2.  PATIENT SURVEYS:  ODI: ODI Score = 21 points (42%)  Interpretation: 41% to 60% (severe disability): Pain is a primary problem for these patients, but they may also be experiencing significant problems in travel, personal care, social life, sexual activity and sleep. A detailed evaluation is appropriate.  COGNITION:   Overall cognitive status: WFL              SENSATION: NT into R thigh, neuropathy into distal LE     OBSERVATION:  R SB,    PALPATION: TTP and hypertonicity of bilat lumbar paraspinals and QL with R > L  Lumbar joint stiffness into extension and rotation with UPA  LUMBAR ROM:   Active  A/PROM  eval  Flexion 75%  Extension 25%  Right lateral flexion 50% p! In low back and glute  Left lateral flexion 50% p! On R in same areas  Right rotation 50%  Left rotation 50%    (Blank rows = not tested)    LOWER EXTREMITY MMT:     MMT R eval L eval  Hip flexion 4-/5 p! 4/5  Hip extension    Hip abduction 4/5 4/5  Hip adduction    Hip internal rotation    Hip external rotation    Knee flexion    Knee extension 4+/5 4+/5  Ankle dorsiflexion      Ankle plantarflexion      Ankle inversion      Ankle eversion       (Blank rows = not tested)     LUMBAR SPECIAL TESTS:    FABER positive Pirformis test positive SIJ compression positive  Slump negative      FUNCTIONAL TESTS: STS requires UE;       GAIT: WFL with noted postural deviations  Observation: scoliotic curves noted in  lumbar and thoracic spine                                                                                                                                                                            TODAY'S TREATMENT  DATE:   6/16  UPA L2-5 bilat grade III STM bilat paraspinal and QL R LE LAD with lumbar bias 5 min LAD with PPT   6/12 UPA L2-5 bilat grade III STM bilat paraspinal and QL R LE LAD with lumbar bias 5 min LAD with PPT   LTR 20x R lateral shift correction 2x10 at wall Lumbar flexion seated with L bias 2x10 Postural changes during the day, neck/shoulder/hip positioning  10/29/23  Exercises - Seated Sciatic Nerve Glide  - 2 x daily - 7 x weekly - 2 sets - 10 reps - 3 hold - Seated Quadratus Lumborum Stretch in Chair  - 2 x daily - 7 x weekly - 1 sets - 3 reps - 30 hold - Sit to Stand with Resistance Around Legs  - 1 x daily - 7 x weekly - 2 sets - 10 reps   PATIENT EDUCATION:    Education details: MOI, diagnosis, prognosis, anatomy, exercise progression, DOMS expectations, muscle firing,  envelope of function, HEP, POC   Person educated: Patient Education method: Explanation, Demonstration, Verbal cues, and Handouts Education comprehension: verbalized understanding, returned demonstration, and verbal cues required   HOME EXERCISE PROGRAM:    Access Code: [email or web address] URL: [email or web address] Date: 10/29/2023 Prepared by: Silver Dross     ASSESSMENT:   CLINICAL IMPRESSION:   Pt requested to forgo exercise today due to baseline pain. Pt did have hypertonicity and active spasm with palpation of lumbar region soft tissue and had improvement in soft tissue extensibility but no significant change in pain. Pt advised on walking tolerance and progression of walking as well as HEP modifications and activity modifications to reduce baseline pain. Plan to continue with progressive  exercise as tolerated with manual for pain management PRN. Pt would benefit from continued skilled therapy in order to reach goals and maximize functional lumbopelvic strength and ROM for return to  normalized ADL and household activities.      OBJECTIVE IMPAIRMENTS: Abnormal gait, decreased activity tolerance, decreased balance, decreased coordination, decreased endurance, decreased mobility, difficulty walking, decreased ROM, decreased strength, hypomobility, increased edema, increased fascial restrictions, impaired perceived functional ability, impaired flexibility, improper body mechanics, and pain.    ACTIVITY LIMITATIONS: carrying, lifting, bending, sitting, standing, squatting, stairs, transfers, and locomotion level   PARTICIPATION LIMITATIONS: meal prep, cleaning, laundry, interpersonal relationship, driving, shopping, community activity, and yard work   PERSONAL FACTORS: PMH - chf, macular degeneration, DM, cervical CA are also affecting patient's functional outcome.    REHAB POTENTIAL: fair   CLINICAL DECISION MAKING: complicated/unstable   EVALUATION COMPLEXITY: moderate     GOALS:   SHORT TERM GOALS: Target date: 12/10/2023     Pt will become independent with HEP in order to demonstrate synthesis of PT education.     Goal status: INITIAL    2. Pt will demonstrate at least a 12.8 improvement in Oswestry Index in order to demonstrate a clinically significant change in LBP and function.    Goal status: INITIAL     LONG TERM GOALS: Target date: 01/27/2024        Pt  will become independent with final HEP in order to demonstrate synthesis of PT education.    Goal status: INITIAL   2.  Pt will demonstrate at least a 25pt improvement in Oswestry Index in order to demonstrate a clinically significant change in LBP and function. Goal status: INITIAL   3.  Pt will be able to demonstrate/report ability to sit/stand/sleep for extended periods of time without pain  in order to  demonstrate functional improvement and tolerance to static positioning.    Goal status: INITIAL   4. Pt will be able to demonstrate/report ability to walk >25 mins without pain in order to demonstrate functional improvement and tolerance to exercise and community mobility.     Goal status: INITIAL     PLAN:   PLAN:   PT FREQUENCY: 1-2x/week   PT DURATION: 12 weeks    PLANNED INTERVENTIONS: Therapeutic exercises, Therapeutic activity, Neuromuscular re-education, Balance training, Gait training, Patient/Family education, Joint manipulation, Joint mobilization, Stair training, Orthotic/Fit training, DME instructions, Aquatic Therapy, Dry Needling, Electrical stimulation, Spinal manipulation, Spinal mobilization, Cryotherapy, Moist heat, scar mobilization, Splintting, Taping, Vasopneumatic device, Traction, Ultrasound, Ionotophoresis 4mg /ml Dexamethasone , Manual therapy, and Re-evaluation.   PLAN FOR NEXT SESSION: mobilizations; weight room progression, lumbar mobility   Silver Dross, PT 11/04/2023, 10:16 AM

## 2023-11-09 ENCOUNTER — Encounter (HOSPITAL_COMMUNITY): Payer: Self-pay

## 2023-11-09 ENCOUNTER — Ambulatory Visit (INDEPENDENT_AMBULATORY_CARE_PROVIDER_SITE_OTHER)

## 2023-11-09 ENCOUNTER — Other Ambulatory Visit: Payer: Self-pay

## 2023-11-09 ENCOUNTER — Emergency Department (HOSPITAL_COMMUNITY)

## 2023-11-09 ENCOUNTER — Ambulatory Visit (HOSPITAL_COMMUNITY)
Admission: RE | Admit: 2023-11-09 | Discharge: 2023-11-09 | Disposition: A | Discharge status: Home / Self Care | Source: Ambulatory Visit

## 2023-11-09 ENCOUNTER — Emergency Department (HOSPITAL_COMMUNITY)
Admission: EM | Admit: 2023-11-09 | Discharge: 2023-11-10 | Discharge status: Against Medical Advice | Attending: Emergency Medicine | Admitting: Emergency Medicine

## 2023-11-09 VITALS — BP 130/57 | HR 60 | Temp 98.4°F | Resp 18 | Ht 66.0 in | Wt 120.0 lb

## 2023-11-09 DIAGNOSIS — W19XXXA Unspecified fall, initial encounter: Secondary | ICD-10-CM

## 2023-11-09 DIAGNOSIS — Z79899 Other long term (current) drug therapy: Secondary | ICD-10-CM | POA: Diagnosis not present

## 2023-11-09 DIAGNOSIS — M1712 Unilateral primary osteoarthritis, left knee: Secondary | ICD-10-CM | POA: Diagnosis not present

## 2023-11-09 DIAGNOSIS — S8992XA Unspecified injury of left lower leg, initial encounter: Secondary | ICD-10-CM | POA: Diagnosis present

## 2023-11-09 DIAGNOSIS — Z8616 Personal history of COVID-19: Secondary | ICD-10-CM | POA: Diagnosis not present

## 2023-11-09 DIAGNOSIS — Z5321 Procedure and treatment not carried out due to patient leaving prior to being seen by health care provider: Secondary | ICD-10-CM | POA: Diagnosis not present

## 2023-11-09 DIAGNOSIS — N1832 Chronic kidney disease, stage 3b: Secondary | ICD-10-CM | POA: Diagnosis not present

## 2023-11-09 DIAGNOSIS — S82132A Displaced fracture of medial condyle of left tibia, initial encounter for closed fracture: Secondary | ICD-10-CM | POA: Diagnosis not present

## 2023-11-09 DIAGNOSIS — S82142A Displaced bicondylar fracture of left tibia, initial encounter for closed fracture: Secondary | ICD-10-CM

## 2023-11-09 DIAGNOSIS — S82102A Unspecified fracture of upper end of left tibia, initial encounter for closed fracture: Secondary | ICD-10-CM

## 2023-11-09 DIAGNOSIS — Z951 Presence of aortocoronary bypass graft: Secondary | ICD-10-CM | POA: Diagnosis not present

## 2023-11-09 DIAGNOSIS — J449 Chronic obstructive pulmonary disease, unspecified: Secondary | ICD-10-CM | POA: Diagnosis not present

## 2023-11-09 DIAGNOSIS — I2510 Atherosclerotic heart disease of native coronary artery without angina pectoris: Secondary | ICD-10-CM | POA: Diagnosis not present

## 2023-11-09 DIAGNOSIS — S8990XA Unspecified injury of unspecified lower leg, initial encounter: Secondary | ICD-10-CM | POA: Diagnosis not present

## 2023-11-09 DIAGNOSIS — Z7901 Long term (current) use of anticoagulants: Secondary | ICD-10-CM | POA: Diagnosis not present

## 2023-11-09 DIAGNOSIS — D631 Anemia in chronic kidney disease: Secondary | ICD-10-CM | POA: Diagnosis not present

## 2023-11-09 DIAGNOSIS — I130 Hypertensive heart and chronic kidney disease with heart failure and stage 1 through stage 4 chronic kidney disease, or unspecified chronic kidney disease: Secondary | ICD-10-CM | POA: Diagnosis not present

## 2023-11-09 DIAGNOSIS — M810 Age-related osteoporosis without current pathological fracture: Secondary | ICD-10-CM | POA: Diagnosis not present

## 2023-11-09 DIAGNOSIS — M25462 Effusion, left knee: Secondary | ICD-10-CM

## 2023-11-09 DIAGNOSIS — W11XXXA Fall on and from ladder, initial encounter: Secondary | ICD-10-CM | POA: Diagnosis not present

## 2023-11-09 DIAGNOSIS — I5022 Chronic systolic (congestive) heart failure: Secondary | ICD-10-CM | POA: Diagnosis not present

## 2023-11-09 DIAGNOSIS — Z87442 Personal history of urinary calculi: Secondary | ICD-10-CM | POA: Diagnosis not present

## 2023-11-09 DIAGNOSIS — Z8673 Personal history of transient ischemic attack (TIA), and cerebral infarction without residual deficits: Secondary | ICD-10-CM | POA: Diagnosis not present

## 2023-11-09 LAB — BASIC METABOLIC PANEL WITH GFR
Anion gap: 15 (ref 5–15)
BUN: 17 mg/dL (ref 8–23)
CO2: 23 mmol/L (ref 22–32)
Calcium: 9.6 mg/dL (ref 8.9–10.3)
Chloride: 99 mmol/L (ref 98–111)
Creatinine, Ser: 1.5 mg/dL — ABNORMAL HIGH (ref 0.61–1.24)
GFR, Estimated: 49 mL/min — ABNORMAL LOW (ref >60)
Glucose, Bld: 127 mg/dL — ABNORMAL HIGH (ref 70–99)
Potassium: 4.2 mmol/L (ref 3.5–5.1)
Sodium: 137 mmol/L (ref 135–145)

## 2023-11-09 LAB — CBC WITH DIFFERENTIAL/PLATELET
Abs Immature Granulocytes: 0.03 10*3/uL (ref 0.00–0.07)
Basophils Absolute: 0.1 10*3/uL (ref 0.0–0.1)
Basophils Relative: 1 %
Eosinophils Absolute: 0.1 10*3/uL (ref 0.0–0.5)
Eosinophils Relative: 1 %
HCT: 41.7 % (ref 39.0–52.0)
Hemoglobin: 13.2 g/dL (ref 13.0–17.0)
Immature Granulocytes: 0 %
Lymphocytes Relative: 17 %
Lymphs Abs: 1.5 10*3/uL (ref 0.7–4.0)
MCH: 29.7 pg (ref 26.0–34.0)
MCHC: 31.7 g/dL (ref 30.0–36.0)
MCV: 93.7 fL (ref 80.0–100.0)
Monocytes Absolute: 1.1 10*3/uL — ABNORMAL HIGH (ref 0.1–1.0)
Monocytes Relative: 12 %
Neutro Abs: 6 10*3/uL (ref 1.7–7.7)
Neutrophils Relative %: 69 %
Platelets: 143 10*3/uL — ABNORMAL LOW (ref 150–400)
RBC: 4.45 MIL/uL (ref 4.22–5.81)
RDW: 15.6 % — ABNORMAL HIGH (ref 11.5–15.5)
WBC: 8.7 10*3/uL (ref 4.0–10.5)
nRBC: 0 % (ref 0.0–0.2)

## 2023-11-09 MED ORDER — OXYCODONE-ACETAMINOPHEN 5-325 MG PO TABS
1.0000 | ORAL_TABLET | Freq: Once | ORAL | Status: AC
  Administered 2023-11-09: 1 via ORAL
  Filled 2023-11-09: qty 1

## 2023-11-09 NOTE — ED Notes (Signed)
 Patient is being discharged from the Urgent Care and sent to the Emergency Department via personal opperated vehicle . Per Jon Belt, patient is in need of higher level of care due to fracture of the left knee. Patient is aware and verbalizes understanding of plan of care.  Vitals:   11/09/23 1626  BP: (!) 130/57  Pulse: 60  Resp: 18  Temp: 98.4 F (36.9 C)  SpO2: 95%

## 2023-11-09 NOTE — ED Provider Notes (Signed)
 [facility]    CSN: [id number] Arrival date & time: 11/09/23  1603      History   Chief Complaint Chief Complaint  Patient presents with   Appointment   Fall    HPI Andrew Townsend is a 73 y.o. male. Was on rung 5 of 8 foot ladder (~ 5 feet up) when ladder slipped and he fell to the opposite side. Landed directly on L knee then fell onto back. Denies hitting head, no LOC. No pain other than L knee.  Difficult to bear weight on LLE. Drove himself here as his RLE is not injured.   Takes eliquis  daily.   He has taken his usual oxycodone  this afternoon as well as tylenol  and declines offer of more pain med.    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)    Medtronic   Myocardial infarction (HCC)    Stroke [facility])     Patient Active Problem List   Diagnosis Date Noted   COVID 07/22/2023   Bilateral recurrent inguinal hernia without obstruction or gangrene 05/09/2023   Lumbar radiculopathy 03/26/2023   Diplopia 06/26/2022   Hyperkalemia 04/03/2022   Paroxysmal atrial fibrillation (HCC) 02/11/2022   Cerebral infarction, unspecified (HCC) 01/30/2022   Other idiopathic scoliosis, thoracolumbar region 12/21/2021   Arthritis of sacroiliac joint of both sides (HCC) 12/21/2021   Piriformis syndrome of both sides 12/21/2021   Hx of shoulder surgery (left rotator cuff surgery, right rotator cuff  then reverse total shoulder arthoplasty) 12/07/2021   Lumbar facet arthropathy 12/07/2021   Bilateral primary osteoarthritis of knee 12/07/2021   Sacroiliac joint pain 12/07/2021   Gastroesophageal reflux disease 10/27/2021   S/P CABG (coronary artery bypass graft) 05/26/2021   ICD (implantable cardioverter-defibrillator) in place 05/26/2021   COPD (chronic obstructive pulmonary disease) (HCC) 05/26/2021   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   Insomnia 05/26/2021   Atherosclerotic heart disease of native coronary artery with unspecified angina pectoris (HCC) 05/26/2021   BPH (benign prostatic hyperplasia) 03/25/2021   Stage 3b chronic kidney disease (HCC) 06/16/2018   Hypertension 04/03/2018   Hyperlipidemia 04/03/2018   Chronic pain 04/03/2018   Chronic systolic congestive heart failure (HCC) 04/03/2018   Gout 04/03/2018   Renal artery stenosis (HCC) 04/03/2018   Thrombocytopenia (HCC) 04/03/2018   Anemia in chronic kidney disease 04/03/2018   Ischemic dilated cardiomyopathy (HCC) 06/29/2016   VT (ventricular tachycardia) (HCC) 06/28/2016    Past Surgical History:  Procedure Laterality Date   arm surgery Right    broken - pins placed but now removed   COLONOSCOPY     CORONARY ARTERY BYPASS GRAFT     in [place]   EYE SURGERY Bilateral    remove cataracts   HERNIA REPAIR     ICD IMPLANT     Medtronic   INGUINAL HERNIA REPAIR Bilateral 08/19/2023  Procedure: OPEN BILATERAL INGUINAL HERNIA REPAIR WITH MESH;  Surgeon: Rubin Calamity, MD;  Location: [facility] OR;  Service: General;  Laterality: Bilateral;   JOINT REPLACEMENT Right    shoulder in [place]   MANDIBLE SURGERY     lower jaw surgery for placement of dentures per patient   MULTIPLE TOOTH EXTRACTIONS     all teeth pulled - dentures   TONSILLECTOMY     UPPER GI ENDOSCOPY     WISDOM TOOTH EXTRACTION         Home Medications    Prior to Admission medications   Medication Sig Start Date End Date Taking? Authorizing  Provider  albuterol  (PROVENTIL ) (2.5 MG/3ML) 0.083% nebulizer solution INHALE THE CONTENTS OF 1 VIAL VIA NEBULIZER EVERY 6 HOURS AS NEEDED FOR WHEEZING OR SHORTNESS OF BREATH. 07/09/22  Yes Boscia, Heather E, NP  albuterol  (VENTOLIN  HFA) 108 (90 Base) MCG/ACT inhaler Inhale 2 puffs into the lungs every 6 (six) hours as needed for wheezing or shortness of breath. 05/09/23  Yes Wallace Search A, PA  allopurinol  (ZYLOPRIM ) 100 MG tablet Take 1 tablet (100 mg total) by mouth daily. 05/09/23  Yes Wallace Search A, PA  apixaban  (ELIQUIS ) 2.5 MG TABS tablet TAKE 1 TABLET BY MOUTH 2 TIMES DAILY. 05/21/23  Yes Lonni Slain, MD  buprenorphine  (BUTRANS ) 20 MCG/HR PTWK Place 1 patch onto the skin once a week. 09/22/23  Yes Marcelino Nurse, MD  docusate sodium (COLACE) 100 MG capsule Take 200 mg by mouth daily as needed for mild constipation.   Yes [provider]  empagliflozin  (JARDIANCE ) 10 MG TABS tablet Take 1 tablet (10 mg total) by mouth daily before breakfast. 09/20/23  Yes Chandra Toribio POUR, MD  ezetimibe  (ZETIA ) 10 MG tablet Take 1 tablet (10 mg total) by mouth daily. 05/09/23  Yes Wallace Search A, PA  gabapentin  (NEURONTIN ) 600 MG tablet Take 2 tablets (1,200 mg total) by mouth at bedtime. 09/12/23 12/11/23 Yes Marcelino Nurse, MD  Magnesium Oxide (DIASENSE MAGNESIUM PO) Take 1,000 mg by mouth daily at 6 (six) AM.   Yes [provider]  Multiple Vitamin (MULTIVITAMIN) tablet Take 1 tablet by mouth daily.   Yes [provider]  omeprazole  (PRILOSEC) 20 MG capsule Take 1 capsule (20 mg total) by mouth daily. 05/09/23  Yes Wallace Search A, PA  oxyCODONE -acetaminophen  (PERCOCET) 10-325 MG tablet Take 1 tablet by mouth every 8 (eight) hours as needed for pain. 10/22/23 11/21/23 Yes Marcelino Nurse, MD  oxyCODONE -acetaminophen  (PERCOCET) 10-325 MG tablet Take 1 tablet by mouth every 8 (eight) hours as needed for pain. 11/21/23 12/21/23 Yes Marcelino Nurse, MD  potassium chloride  (KLOR-CON  M)  10 MEQ tablet TAKE 1 TABLET (10 MEQ TOTAL) BY MOUTH ONCE DAILY. DO NOT CRUSH OR CHEW. 07/23/23  Yes Chandra Toribio POUR, MD  rosuvastatin  (CRESTOR ) 40 MG tablet Take 1 tablet (40 mg total) by mouth at bedtime. 05/09/23  Yes Wallace Search A, PA  tiZANidine  (ZANAFLEX ) 4 MG tablet Take 1 tablet (4 mg total) by mouth every 8 (eight) hours as needed. 09/12/23  Yes Marcelino Nurse, MD  TRELEGY ELLIPTA  100-62.5-25 MCG/ACT AEPB INHALE 1 PUFF INTO THE LUNGS DAILY. 10/23/23  Yes Chandra Toribio POUR, MD  Vibegron  (GEMTESA ) 75 MG TABS Take 1 tablet (75 mg total) by mouth daily. 09/19/23  Yes McGowan, Clotilda A, PA-C  Vibegron  (GEMTESA ) 75 MG TABS Take 1 tablet (75 mg total) by mouth daily. 10/30/23  Yes McGowan, Clotilda A, PA-C  zolpidem  (AMBIEN  CR) 6.25 MG CR tablet TAKE  1 TABLET (6.25 MG TOTAL) BY MOUTH AT BEDTIME AS NEEDED FOR SLEEP. 10/21/23  Yes Chandra Toribio POUR, MD  furosemide  (LASIX ) 40 MG tablet Take 1 tablet (40 mg total) by mouth daily as needed for fluid. 07/31/22 08/19/23  Leverne Charlies Helling, PA-C  metoprolol  succinate (TOPROL -XL) 100 MG 24 hr tablet Take 1 tablet (100 mg total) by mouth daily. PLEASE NOTE DECREASED DOSE. 05/09/23 08/19/23  Wallace Search A, PA  nitroGLYCERIN  (NITROSTAT ) 0.4 MG SL tablet Place 1 tablet (0.4 mg total) under the tongue every 5 (five) minutes as needed for chest pain. If second dose is needed to relieve chest pain, seek immediate emergency medical attention. 02/07/23   Wallace Search LABOR, PA    Family History History reviewed. No pertinent family history.  Social History Social History   Tobacco Use   Smoking status: Former    Current packs/day: 1.00    Types: E-cigarettes, Cigarettes    Quit date: 2015   Smokeless tobacco: Current   Tobacco comments:    Quit smoking cigarettes in 2015 - 1 ppd  Vaping Use   Vaping status: Every Day   Substances: Nicotine  Substance Use Topics   Alcohol use: Not Currently   Drug use: Never     Allergies   Patient has no known  allergies.   Review of Systems Review of Systems   Physical Exam Triage Vital Signs ED Triage Vitals  Encounter Vitals Group     BP 11/09/23 1626 (!) 130/57     Girls Systolic BP Percentile --      Girls Diastolic BP Percentile --      Boys Systolic BP Percentile --      Boys Diastolic BP Percentile --      Pulse Rate 11/09/23 1626 60     Resp 11/09/23 1626 18     Temp 11/09/23 1626 98.4 F (36.9 C)     Temp Source 11/09/23 1626 Oral     SpO2 11/09/23 1626 95 %     Weight 11/09/23 1625 120 lb (54.4 kg)     Height 11/09/23 1625 5' 6 (1.676 m)     Head Circumference --      Peak Flow --      Pain Score 11/09/23 1622 5     Pain Loc --      Pain Education --      Exclude from Growth Chart --    No data found.  Updated Vital Signs BP (!) 130/57 (BP Location: Left Arm)   Pulse 60   Temp 98.4 F (36.9 C) (Oral)   Resp 18   Ht 5' 6 (1.676 m)   Wt 120 lb (54.4 kg)   SpO2 95%   BMI 19.37 kg/m   Visual Acuity Right Eye Distance:   Left Eye Distance:   Bilateral Distance:    Right Eye Near:   Left Eye Near:    Bilateral Near:     Physical Exam Constitutional:      Appearance: Normal appearance.  HENT:     Head: Normocephalic and atraumatic.  Pulmonary:     Effort: Pulmonary effort is normal.   Musculoskeletal:     Left knee: Swelling and bony tenderness present. Tenderness present.     Comments: Significant swelling around L knee compared with R   Neurological:     Mental Status: He is alert.      UC Treatments / Results  Labs (all labs ordered are listed, but only abnormal results are  displayed) Labs Reviewed - No data to display  EKG   Radiology DG Knee Complete 4 Views Left Result Date: 11/09/2023 CLINICAL DATA:  Injury with swelling. EXAM: LEFT KNEE - COMPLETE 4+ VIEW COMPARISON:  None Available. FINDINGS: A lucency is noted in the intercondylar space of the proximal tibia with possible extension into the lateral tibial plateau. The  remaining bony structures appear intact. No dislocation is seen. Chondrocalcinosis is noted. There is a large joint effusion at the knee. Vascular calcifications and surgical clips are present in the soft tissues. IMPRESSION: 1. Suspected nondisplaced fracture of the lateral tibial plateau. CT is recommended for further evaluation. 2. Large joint effusion. Electronically Signed   By: Leita Birmingham M.D.   On: 11/09/2023 17:41    Procedures Procedures (including critical care time)  Medications Ordered in UC Medications - No data to display  Initial Impression / Assessment and Plan / UC Course  I have reviewed the triage vital signs and the nursing notes.  Pertinent labs & imaging results that were available during my care of the patient were reviewed by me and considered in my medical decision making (see chart for details).    Radiology report recommends need for CT scan. Dispo to ED for further eval.   Final Clinical Impressions(s) / UC Diagnoses   Final diagnoses:  Closed fracture of left tibial plateau, initial encounter  Effusion of left knee  Fall, initial encounter     Discharge Instructions      Please go to the ER for further care. The radiologist recommends a CT scan of your knee.    ED Prescriptions   None    PDMP not reviewed this encounter.   Richad Jon HERO, NP 11/10/23 1003

## 2023-11-09 NOTE — ED Triage Notes (Signed)
 Patient lost his balance and fell from a ladder this afternoon , sent here from urgent care for further evaluation .

## 2023-11-09 NOTE — Discharge Instructions (Signed)
 Please go to the ER for further care. The radiologist recommends a CT scan of your knee.

## 2023-11-09 NOTE — ED Triage Notes (Signed)
 Patient presenting with fall resulting in left knee pain onset today. Patient was on a ladder cutting a limb off a tree, about 5 ft, and the ladder fell one way while he went the other. States he landed on the left knee and then the back.  Unable to bear weight on the left knee and the knee is swollen despite ice pack.  Prescriptions or OTC medications tried: Yes- Tylenol  and rx oxycodone  (1600)   with mild relief of pain.

## 2023-11-09 NOTE — ED Notes (Signed)
 Patient is being discharged from the Urgent Care and sent to the Emergency Department via POV . Per Jon Belt, NP, patient is in need of higher level of care due to limited resources and . Patient is aware and verbalizes understanding of plan of care.  Vitals:   11/09/23 1626  BP: (!) 130/57  Pulse: 60  Resp: 18  Temp: 98.4 F (36.9 C)  SpO2: 95%

## 2023-11-10 ENCOUNTER — Encounter (HOSPITAL_COMMUNITY): Payer: Self-pay

## 2023-11-10 ENCOUNTER — Emergency Department (HOSPITAL_COMMUNITY)
Admission: EM | Admit: 2023-11-10 | Discharge: 2023-11-10 | Disposition: A | Discharge status: Home / Self Care | Attending: Emergency Medicine | Admitting: Emergency Medicine

## 2023-11-10 DIAGNOSIS — N1832 Chronic kidney disease, stage 3b: Secondary | ICD-10-CM | POA: Insufficient documentation

## 2023-11-10 DIAGNOSIS — Z87442 Personal history of urinary calculi: Secondary | ICD-10-CM | POA: Insufficient documentation

## 2023-11-10 DIAGNOSIS — Z951 Presence of aortocoronary bypass graft: Secondary | ICD-10-CM | POA: Insufficient documentation

## 2023-11-10 DIAGNOSIS — Z79899 Other long term (current) drug therapy: Secondary | ICD-10-CM | POA: Insufficient documentation

## 2023-11-10 DIAGNOSIS — W11XXXA Fall on and from ladder, initial encounter: Secondary | ICD-10-CM | POA: Insufficient documentation

## 2023-11-10 DIAGNOSIS — I130 Hypertensive heart and chronic kidney disease with heart failure and stage 1 through stage 4 chronic kidney disease, or unspecified chronic kidney disease: Secondary | ICD-10-CM | POA: Insufficient documentation

## 2023-11-10 DIAGNOSIS — Z7901 Long term (current) use of anticoagulants: Secondary | ICD-10-CM | POA: Insufficient documentation

## 2023-11-10 DIAGNOSIS — Z8616 Personal history of COVID-19: Secondary | ICD-10-CM | POA: Insufficient documentation

## 2023-11-10 DIAGNOSIS — S82132A Displaced fracture of medial condyle of left tibia, initial encounter for closed fracture: Secondary | ICD-10-CM | POA: Diagnosis not present

## 2023-11-10 DIAGNOSIS — D631 Anemia in chronic kidney disease: Secondary | ICD-10-CM | POA: Insufficient documentation

## 2023-11-10 DIAGNOSIS — Z8673 Personal history of transient ischemic attack (TIA), and cerebral infarction without residual deficits: Secondary | ICD-10-CM | POA: Insufficient documentation

## 2023-11-10 DIAGNOSIS — I2510 Atherosclerotic heart disease of native coronary artery without angina pectoris: Secondary | ICD-10-CM | POA: Insufficient documentation

## 2023-11-10 DIAGNOSIS — I5022 Chronic systolic (congestive) heart failure: Secondary | ICD-10-CM | POA: Insufficient documentation

## 2023-11-10 DIAGNOSIS — J449 Chronic obstructive pulmonary disease, unspecified: Secondary | ICD-10-CM | POA: Insufficient documentation

## 2023-11-10 MED ORDER — ACETAMINOPHEN 500 MG PO TABS
1000.0000 mg | ORAL_TABLET | Freq: Once | ORAL | Status: AC
  Administered 2023-11-10: 1000 mg via ORAL
  Filled 2023-11-10: qty 2

## 2023-11-10 MED ORDER — KETOROLAC TROMETHAMINE 60 MG/2ML IM SOLN
30.0000 mg | Freq: Once | INTRAMUSCULAR | Status: AC
  Administered 2023-11-10: 30 mg via INTRAMUSCULAR
  Filled 2023-11-10: qty 2

## 2023-11-10 MED ORDER — OXYCODONE HCL 5 MG PO TABS
5.0000 mg | ORAL_TABLET | Freq: Once | ORAL | Status: AC
  Administered 2023-11-10: 5 mg via ORAL
  Filled 2023-11-10: qty 1

## 2023-11-10 NOTE — ED Notes (Signed)
 Pt stated he is leaving the ED due to wait times. Pt advised to stay and wait for test results, Pt refused and requested someone take him outside to leave. Pt seen getting into his car and leaving.

## 2023-11-10 NOTE — ED Provider Notes (Signed)
 [place] EMERGENCY DEPARTMENT AT [facility] Provider Note  CSN: [id number] Arrival date & time: 11/10/23 9884  Chief Complaint(s) Fall  HPI Andrew Townsend is a 73 y.o. male with a past medical history listed below here for left knee pain.  Patient was seen at urgent care where they obtain x-ray concerning for tibial plateau fracture.  Patient went to [facility] and had a CT scan obtained confirming that but came here after prolonged wait in the waiting room.  Fall was from a ladder onto dirt.  Denies any additional pain or injuries.  No headache, neck pain, back pain or other extremity pain.  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)    Medtronic   Myocardial infarction (HCC)    Stroke [facility])    Patient Active Problem List   Diagnosis Date Noted   COVID 07/22/2023   Bilateral recurrent inguinal hernia without obstruction or gangrene 05/09/2023   Lumbar radiculopathy 03/26/2023   Diplopia 06/26/2022   Hyperkalemia 04/03/2022   Paroxysmal atrial fibrillation (HCC) 02/11/2022   Cerebral infarction, unspecified (HCC) 01/30/2022   Other idiopathic scoliosis, thoracolumbar region 12/21/2021   Arthritis of sacroiliac joint of both sides (HCC) 12/21/2021   Piriformis syndrome of both sides 12/21/2021   Hx of shoulder surgery (left rotator cuff surgery, right rotator cuff then  reverse total shoulder arthoplasty) 12/07/2021   Lumbar facet arthropathy 12/07/2021   Bilateral primary osteoarthritis of knee 12/07/2021   Sacroiliac joint pain 12/07/2021   Gastroesophageal reflux disease 10/27/2021   S/P CABG (coronary artery bypass graft) 05/26/2021   ICD (implantable cardioverter-defibrillator) in place 05/26/2021   COPD (chronic obstructive pulmonary disease) (HCC) 05/26/2021   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   Insomnia 05/26/2021   Atherosclerotic heart disease of native coronary artery with unspecified angina pectoris (HCC) 05/26/2021   BPH (benign prostatic hyperplasia) 03/25/2021   Stage 3b chronic kidney disease (HCC) 06/16/2018   Hypertension 04/03/2018   Hyperlipidemia 04/03/2018   Chronic pain 04/03/2018   Chronic systolic congestive heart failure (HCC) 04/03/2018   Gout 04/03/2018   Renal artery stenosis (HCC) 04/03/2018   Thrombocytopenia (HCC) 04/03/2018   Anemia in chronic kidney disease 04/03/2018   Ischemic dilated cardiomyopathy (HCC) 06/29/2016   VT (ventricular tachycardia) (HCC) 06/28/2016   Home Medication(s) Prior to Admission medications   Medication Sig Start Date End Date Taking? Authorizing Provider  albuterol  (PROVENTIL ) (2.5 MG/3ML) 0.083% nebulizer solution INHALE THE CONTENTS OF 1 VIAL VIA NEBULIZER EVERY 6 HOURS AS NEEDED FOR WHEEZING OR SHORTNESS OF BREATH. 07/09/22   Hanford Powell BRAVO, NP  albuterol  (VENTOLIN  HFA) 108 (90 Base) MCG/ACT inhaler Inhale 2 puffs into the lungs every 6 (six) hours as needed for wheezing or shortness of breath. 05/09/23   Wallace Joesph LABOR, PA  allopurinol  (ZYLOPRIM ) 100 MG tablet Take 1 tablet (100 mg  total) by mouth daily. 05/09/23   Wallace Joesph LABOR, PA  apixaban  (ELIQUIS ) 2.5 MG TABS tablet TAKE 1 TABLET BY MOUTH 2 TIMES DAILY. 05/21/23   Lonni Slain, MD  buprenorphine  (BUTRANS ) 20 MCG/HR PTWK Place 1 patch onto the skin once a week. 09/22/23   Marcelino Nurse, MD  docusate sodium (COLACE) 100  MG capsule Take 200 mg by mouth daily as needed for mild constipation.    [provider]  empagliflozin  (JARDIANCE ) 10 MG TABS tablet Take 1 tablet (10 mg total) by mouth daily before breakfast. 09/20/23   Chandra Toribio POUR, MD  ezetimibe  (ZETIA ) 10 MG tablet Take 1 tablet (10 mg total) by mouth daily. 05/09/23   Wallace Joesph LABOR, PA  furosemide  (LASIX ) 40 MG tablet Take 1 tablet (40 mg total) by mouth daily as needed for fluid. 07/31/22 08/19/23  Leverne Charlies Helling, PA-C  gabapentin  (NEURONTIN ) 600 MG tablet Take 2 tablets (1,200 mg total) by mouth at bedtime. 09/12/23 12/11/23  Lateef, Bilal, MD  Magnesium Oxide (DIASENSE MAGNESIUM PO) Take 1,000 mg by mouth daily at 6 (six) AM.    [provider]  metoprolol  succinate (TOPROL -XL) 100 MG 24 hr tablet Take 1 tablet (100 mg total) by mouth daily. PLEASE NOTE DECREASED DOSE. 05/09/23 08/19/23  Wallace Joesph A, PA  Multiple Vitamin (MULTIVITAMIN) tablet Take 1 tablet by mouth daily.    [provider]  nitroGLYCERIN  (NITROSTAT ) 0.4 MG SL tablet Place 1 tablet (0.4 mg total) under the tongue every 5 (five) minutes as needed for chest pain. If second dose is needed to relieve chest pain, seek immediate emergency medical attention. 02/07/23   Wallace Joesph LABOR, PA  omeprazole  (PRILOSEC) 20 MG capsule Take 1 capsule (20 mg total) by mouth daily. 05/09/23   Wallace Joesph LABOR, PA  oxyCODONE -acetaminophen  (PERCOCET) 10-325 MG tablet Take 1 tablet by mouth every 8 (eight) hours as needed for pain. 10/22/23 11/21/23  Marcelino Nurse, MD  oxyCODONE -acetaminophen  (PERCOCET) 10-325 MG tablet Take 1 tablet by mouth every 8 (eight) hours as needed for pain. 11/21/23 12/21/23  Marcelino Nurse, MD  potassium chloride  (KLOR-CON  M) 10 MEQ tablet TAKE 1 TABLET (10 MEQ TOTAL) BY MOUTH ONCE DAILY. DO NOT CRUSH OR CHEW. 07/23/23   Chandra Toribio POUR, MD  rosuvastatin  (CRESTOR ) 40 MG tablet Take 1 tablet (40 mg total) by mouth at bedtime. 05/09/23   Wallace Joesph LABOR, PA   tiZANidine  (ZANAFLEX ) 4 MG tablet Take 1 tablet (4 mg total) by mouth every 8 (eight) hours as needed. 09/12/23   Marcelino Nurse, MD  TRELEGY ELLIPTA  100-62.5-25 MCG/ACT AEPB INHALE 1 PUFF INTO THE LUNGS DAILY. 10/23/23   Chandra Toribio POUR, MD  Vibegron  (GEMTESA ) 75 MG TABS Take 1 tablet (75 mg total) by mouth daily. 09/19/23   Helon Kirsch A, PA-C  Vibegron  (GEMTESA ) 75 MG TABS Take 1 tablet (75 mg total) by mouth daily. 10/30/23   Helon Kirsch A, PA-C  zolpidem  (AMBIEN  CR) 6.25 MG CR tablet TAKE 1 TABLET (6.25 MG TOTAL) BY MOUTH AT BEDTIME AS NEEDED FOR SLEEP. 10/21/23   Chandra Toribio POUR, MD  Allergies Patient has no known allergies.  Review of Systems Review of Systems As noted in HPI  Physical Exam Vital Signs  I have reviewed the triage vital signs BP (!) 168/72 (BP Location: Right Arm)   Pulse 68   Temp 98.3 F (36.8 C) (Oral)   Resp 20   SpO2 98%   Physical Exam Constitutional:      General: He is not in acute distress.    Appearance: He is well-developed. He is not diaphoretic.  HENT:     Head: Normocephalic.     Right Ear: External ear normal.     Left Ear: External ear normal.   Eyes:     General: No scleral icterus.       Right eye: No discharge.        Left eye: No discharge.     Conjunctiva/sclera: Conjunctivae normal.     Pupils: Pupils are equal, round, and reactive to light.    Cardiovascular:     Rate and Rhythm: Regular rhythm.     Pulses:          Radial pulses are 2+ on the right side and 2+ on the left side.       Dorsalis pedis pulses are 2+ on the right side and 2+ on the left side.     Heart sounds: Normal heart sounds. No murmur heard.    No friction rub. No gallop.  Pulmonary:     Effort: Pulmonary effort is normal. No respiratory distress.     Breath sounds: Normal breath sounds. No stridor.  Abdominal:      General: There is no distension.     Palpations: Abdomen is soft.     Tenderness: There is no abdominal tenderness.   Musculoskeletal:     Cervical back: Normal range of motion and neck supple. No bony tenderness.     Thoracic back: No bony tenderness.     Lumbar back: No bony tenderness.     Left knee: Swelling and bony tenderness present. No lacerations. Decreased range of motion.     Comments: Clavicle stable. Chest stable to AP/Lat compression. Pelvis stable to Lat compression. No obvious extremity deformity. No chest or abdominal wall contusion.   Skin:    General: Skin is warm.   Neurological:     Mental Status: He is alert and oriented to person, place, and time.     GCS: GCS eye subscore is 4. GCS verbal subscore is 5. GCS motor subscore is 6.     Comments: Moving all extremities     ED Results and Treatments Labs (all labs ordered are listed, but only abnormal results are displayed) Labs Reviewed - No data to display                                                                                                                       EKG  EKG Interpretation Date/Time:    Ventricular Rate:    PR Interval:  QRS Duration:    QT Interval:    QTC Calculation:   R Axis:      Text Interpretation:         Radiology CT Knee Left Wo Contrast Result Date: 11/09/2023 CLINICAL DATA:  Recent knee injury with difficulty ambulating and likely lateral tibial plateau fracture on recent plain film EXAM: CT OF THE LEFT KNEE WITHOUT CONTRAST TECHNIQUE: Multidetector CT imaging of the left knee was performed according to the standard protocol. Multiplanar CT image reconstructions were also generated. RADIATION DOSE REDUCTION: This exam was performed according to the departmental dose-optimization program which includes automated exposure control, adjustment of the mA and/or kV according to patient size and/or use of iterative reconstruction technique. COMPARISON:  Plain film from  earlier in the same day. FINDINGS: Bones/Joint/Cartilage The distal femur demonstrates osteoporotic changes without acute fracture. The patella is within normal limits. Medial tibial plateau is unremarkable. The lateral tibial plateau shows fragmentation primarily along the posterior margin of the lateral tibial plateau. Impaction of the dominant fracture fragment is noted of a few mm. Some extension into the intercondylar eminence is seen as well as inferior linear extension along the posterior aspect of the tibial metaphysis. Large joint effusion is noted with fat-fluid and fluid-fluid levels consistent with lipohemarthrosis. Ligaments Suboptimally assessed by CT. Muscles and Tendons Surrounding musculature is within normal limits. Soft tissues Joint effusion is noted. Small posteromedial Baker's cyst is seen. Vascular calcifications are noted. IMPRESSION: Fracture involving the lateral tibial plateau primarily with evidence of impaction. Extension of the fracture lines into the intercondylar eminence and inferiorly along the posterior margin of the tibia is seen as well. Degenerative changes of the knee joint are seen as well as some osteoporotic change. Electronically Signed   By: Oneil Devonshire M.D.   On: 11/09/2023 22:53   DG Knee Complete 4 Views Left Result Date: 11/09/2023 CLINICAL DATA:  Injury with swelling. EXAM: LEFT KNEE - COMPLETE 4+ VIEW COMPARISON:  None Available. FINDINGS: A lucency is noted in the intercondylar space of the proximal tibia with possible extension into the lateral tibial plateau. The remaining bony structures appear intact. No dislocation is seen. Chondrocalcinosis is noted. There is a large joint effusion at the knee. Vascular calcifications and surgical clips are present in the soft tissues. IMPRESSION: 1. Suspected nondisplaced fracture of the lateral tibial plateau. CT is recommended for further evaluation. 2. Large joint effusion. Electronically Signed   By: Leita Birmingham  M.D.   On: 11/09/2023 17:41    Medications Ordered in ED Medications  acetaminophen  (TYLENOL ) tablet 1,000 mg (1,000 mg Oral Given 11/10/23 0324)  ketorolac (TORADOL) injection 30 mg (30 mg Intramuscular Given 11/10/23 0325)  oxyCODONE  (Oxy IR/ROXICODONE ) immediate release tablet 5 mg (5 mg Oral Given 11/10/23 0325)   Procedures Procedures  (including critical care time) Medical Decision Making / ED Course   Medical Decision Making Amount and/or Complexity of Data Reviewed Radiology: independent interpretation performed. Decision-making details documented in ED Course.  Risk OTC drugs. Prescription drug management.    Left tibial plateau of fracture. Patient provided with additional pain medicine, knee immobilizer and crutches.  He is already established with Dr. Genelle from orthopedic surgery.  Recommend he follow-up with him for definitive management.  Patient already Rx'd pain medicine.     Final Clinical Impression(s) / ED Diagnoses Final diagnoses:  Closed fracture of medial portion of left tibial plateau, initial encounter   The patient appears reasonably screened and/or stabilized for discharge and I doubt  any other medical condition or other [facility] requiring further screening, evaluation, or treatment in the ED at this time. I have discussed the findings, Dx and Tx plan with the patient/family who expressed understanding and agree(s) with the plan. Discharge instructions discussed at length. The patient/family was given strict return precautions who verbalized understanding of the instructions. No further questions at time of discharge.  Disposition: Discharge  Condition: Good  ED Discharge Orders     None        Follow Up: Genelle Standing, MD [address] [place] [address] [phone]     Chandra Toribio POUR, MD [address] [place] [address] [phone]        This chart was dictated using voice recognition software.   Despite best efforts to proofread,  errors can occur which can change the documentation meaning.    Trine Raynell Moder, MD 11/10/23 [phone]

## 2023-11-10 NOTE — ED Triage Notes (Signed)
 [facility] was at [facility] and decided to some here after waiting,he was sent by urgent care tibia plateau fracture and needing a ct for further evaluation

## 2023-11-11 ENCOUNTER — Ambulatory Visit: Payer: Self-pay

## 2023-11-11 DIAGNOSIS — S82132A Displaced fracture of medial condyle of left tibia, initial encounter for closed fracture: Secondary | ICD-10-CM | POA: Diagnosis not present

## 2023-11-11 DIAGNOSIS — R2689 Other abnormalities of gait and mobility: Secondary | ICD-10-CM | POA: Diagnosis not present

## 2023-11-11 NOTE — Telephone Encounter (Signed)
 FYI Only or Action Required?: FYI only for provider.  Patient was last seen in primary care on 09/20/2023 by Chandra Toribio POUR, MD. Called Nurse Triage reporting Fall. Symptoms began yesterday. Interventions attempted: Nothing. Symptoms are: unchanged.  Triage Disposition: See PCP Within 2 Weeks apt on 11/19/23  Patient/caregiver understands and will follow disposition?: Yes    Copied from CRM [id number]. Topic: Clinical - Red Word Triage >> Nov 11, 2023 10:39 AM Andrew Townsend wrote: Patient called in with red word: Andrew Townsend yesterday Reason for Disposition  [1] Fall AND [2] went to emergency department for evaluation or treatment  Answer Assessment - Initial Assessment Questions 1. MECHANISM: How did the fall happen?     Was on a ladder about 5 feet up and fell 2. DOMESTIC VIOLENCE AND ELDER ABUSE SCREENING: Did you fall because someone pushed you or tried to hurt you? If Yes, ask: Are you safe now?     denies 3. ONSET: When did the fall happen? (e.g., minutes, hours, or days ago)     yesterday 4. LOCATION: What part of the body hit the ground? (e.g., back, buttocks, head, hips, knees, hands, head, stomach)     Leg, knee and back 5. INJURY: Did you hurt (injure) yourself when you fell? If Yes, ask: What did you injure? Tell me more about this? (e.g., body area; type of injury; pain severity)     Pain to leg knee and back 6. PAIN: Is there any pain? If Yes, ask: How bad is the pain? (e.g., Scale 1-10; or mild,  moderate, severe)   - NONE (0): No pain   - MILD (1-3): Doesn't interfere with normal activities    - MODERATE (4-7): Interferes with normal activities or awakens from sleep    - SEVERE (8-10): Excruciating pain, unable to do any normal activities      severe 7. SIZE: For cuts, bruises, or swelling, ask: How large is it? (e.g., inches or centimeters)      na 8. PREGNANCY: Is there any chance you are pregnant? When was your last menstrual period?     na 9. OTHER  SYMPTOMS: Do you have any other symptoms? (e.g., dizziness, fever, weakness; new onset or worsening).      Denies.  10. CAUSE: What do you think caused the fall (or falling)? (e.g., tripped, dizzy spell)       Ground was soft and ladder was sinking into the dirt  Protocols used: Falls and [facility]

## 2023-11-12 ENCOUNTER — Encounter (HOSPITAL_BASED_OUTPATIENT_CLINIC_OR_DEPARTMENT_OTHER): Payer: Self-pay | Admitting: Student

## 2023-11-12 ENCOUNTER — Ambulatory Visit (HOSPITAL_BASED_OUTPATIENT_CLINIC_OR_DEPARTMENT_OTHER): Admitting: Student

## 2023-11-12 DIAGNOSIS — S82122A Displaced fracture of lateral condyle of left tibia, initial encounter for closed fracture: Secondary | ICD-10-CM

## 2023-11-12 NOTE — Progress Notes (Signed)
 Chief Complaint: Left knee injury    Discussed the use of AI scribe software for clinical note transcription with the patient, who gave verbal consent to proceed.  History of Present Illness Andrew Townsend is a 73 year old male who presents with a tibial plateau fracture following a fall from a ladder. He is accompanied by his sister, Andres.  Three days ago, he sustained a tibial plateau fracture after falling from the fifth rung of an eight-foot A-frame ladder. X-rays and a CT scan revealed a fracture of the lateral tibial plateau. He experiences significant knee pain, ranging from 3-4 at rest to 8-9 with movement. He has difficulty using crutches and slides on his buttocks to navigate stairs due to fear of falling. A recent fall occurred when he attempted to get out of bed without his brace, resulting in a loss of balance. He is concerned about navigating his home, especially carpeted areas, and accessing the upstairs bathroom.  He reports numbness in his foot and difficulty wiggling his toes, with minimal sensation in the toes and significant numbness in the foot. Ankle movement is limited and painful, with pain radiating up the leg. He is currently using oxycodone  10 mg a couple of times a day for pain management and is on Eliquis .   Surgical History:   None  PMH/PSH/Family History/Social History/Meds/Allergies:    Past Medical History:  Diagnosis Date   AICD (automatic cardioverter/defibrillator) present    Medtronic   Anemia    Arthritis    Blood transfusion without reported diagnosis    with heart surgery per patient   CAD (coronary artery disease)    Cataract    had surgery to remove   Chronic kidney disease    stage 3 per patient   COPD (chronic obstructive pulmonary disease) (HCC)    GERD (gastroesophageal reflux disease)    History of kidney stones    passed stones, no surgery per patient   Hyperlipidemia    Hypertension    Insomnia     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)    Medtronic   Myocardial infarction (HCC)    Stroke (HCC)    Past Surgical History:  Procedure Laterality Date   arm surgery Right    broken - pins placed but now removed   COLONOSCOPY     CORONARY ARTERY BYPASS GRAFT     in [place]   EYE SURGERY Bilateral    remove cataracts   HERNIA REPAIR     ICD IMPLANT     Medtronic   INGUINAL HERNIA REPAIR Bilateral 08/19/2023   Procedure: OPEN BILATERAL INGUINAL HERNIA REPAIR WITH MESH;  Surgeon: Rubin Calamity, MD;  Location: [facility] OR;  Service: General;  Laterality: Bilateral;   JOINT REPLACEMENT Right    shoulder in [place]   MANDIBLE SURGERY     lower jaw surgery for placement of dentures per patient   MULTIPLE TOOTH EXTRACTIONS     all teeth pulled - dentures   TONSILLECTOMY     UPPER GI ENDOSCOPY     WISDOM TOOTH EXTRACTION     Social History   Socioeconomic History   Marital status: Widowed    Spouse name: Not on file   Number of children: Not on file   Years of education: Not on file   Highest education level:  GED or equivalent  Occupational History   Not on file  Tobacco Use   Smoking status: Former    Current packs/day: 1.00    Types: E-cigarettes, Cigarettes    Quit date: 2015   Smokeless tobacco: Current   Tobacco comments:    Quit smoking cigarettes in 2015 - 1 ppd  Vaping Use   Vaping status: Every Day   Substances: Nicotine  Substance and Sexual Activity   Alcohol use: Not Currently   Drug use: Never   Sexual activity: Not Currently  Other Topics Concern   Not on file  Social History Narrative   Not on file   Social Drivers of Health   Financial Resource Strain: Low Risk  (07/11/2023)   Overall Financial Resource Strain (CARDIA)    Difficulty of Paying Living Expenses: Not hard at all  Food Insecurity: No Food Insecurity (07/11/2023)   Hunger Vital Sign    Worried About Running Out of Food in the Last Year: Never true     Ran Out of Food in the Last Year: Never true  Transportation Needs: No Transportation Needs (07/11/2023)   PRAPARE - Administrator, Civil Service (Medical): No    Lack of Transportation (Non-Medical): No  Physical Activity: Inactive (07/11/2023)   Exercise Vital Sign    Days of Exercise per Week: 0 days    Minutes of Exercise per Session: 0 min  Stress: No Stress Concern Present (07/11/2023)   [employer] of Occupational Health - Occupational Stress Questionnaire    Feeling of Stress : Not at all  Social Connections: Moderately Isolated (07/11/2023)   Social Connection and Isolation Panel    Frequency of Communication with Friends and Family: Twice a week    Frequency of Social Gatherings with Friends and Family: More than three times a week    Attends Religious Services: Never    Database administrator or Organizations: Yes    Attends Engineer, structural: More than 4 times per year    Marital Status: Widowed   No family history on file. No Known Allergies Current Outpatient Medications  Medication Sig Dispense Refill   albuterol  (PROVENTIL ) (2.5 MG/3ML) 0.083% nebulizer solution INHALE THE CONTENTS OF 1 VIAL VIA NEBULIZER EVERY 6 HOURS AS NEEDED FOR WHEEZING OR SHORTNESS OF BREATH. 150 mL 1   albuterol  (VENTOLIN  HFA) 108 (90 Base) MCG/ACT inhaler Inhale 2 puffs into the lungs every 6 (six) hours as needed for wheezing or shortness of breath. 24 g 1   allopurinol  (ZYLOPRIM ) 100 MG tablet Take 1 tablet (100 mg total) by mouth daily. 90 tablet 1   apixaban  (ELIQUIS ) 2.5 MG TABS tablet TAKE 1 TABLET BY MOUTH 2 TIMES DAILY. 60 tablet 5   buprenorphine  (BUTRANS ) 20 MCG/HR PTWK Place 1 patch onto the skin once a week. 4 patch 2   docusate sodium (COLACE) 100 MG capsule Take 200 mg by mouth daily as needed for mild constipation.     empagliflozin  (JARDIANCE ) 10 MG TABS tablet Take 1 tablet (10 mg total) by mouth daily before breakfast. 90 tablet 3   ezetimibe  (ZETIA )  10 MG tablet Take 1 tablet (10 mg total) by mouth daily. 90 tablet 1   furosemide  (LASIX ) 40 MG tablet Take 1 tablet (40 mg total) by mouth daily as needed for fluid. 90 tablet 0   gabapentin  (NEURONTIN ) 600 MG tablet Take 2 tablets (1,200 mg total) by mouth at bedtime. 60 tablet 2   Magnesium Oxide (DIASENSE MAGNESIUM  PO) Take 1,000 mg by mouth daily at 6 (six) AM.     metoprolol  succinate (TOPROL -XL) 100 MG 24 hr tablet Take 1 tablet (100 mg total) by mouth daily. PLEASE NOTE DECREASED DOSE. 90 tablet 1   Multiple Vitamin (MULTIVITAMIN) tablet Take 1 tablet by mouth daily.     nitroGLYCERIN  (NITROSTAT ) 0.4 MG SL tablet Place 1 tablet (0.4 mg total) under the tongue every 5 (five) minutes as needed for chest pain. If second dose is needed to relieve chest pain, seek immediate emergency medical attention. 25 tablet 1   omeprazole  (PRILOSEC) 20 MG capsule Take 1 capsule (20 mg total) by mouth daily. 90 capsule 1   oxyCODONE -acetaminophen  (PERCOCET) 10-325 MG tablet Take 1 tablet by mouth every 8 (eight) hours as needed for pain. 90 tablet 0   [START ON 11/21/2023] oxyCODONE -acetaminophen  (PERCOCET) 10-325 MG tablet Take 1 tablet by mouth every 8 (eight) hours as needed for pain. 90 tablet 0   potassium chloride  (KLOR-CON  M) 10 MEQ tablet TAKE 1 TABLET (10 MEQ TOTAL) BY MOUTH ONCE DAILY. DO NOT CRUSH OR CHEW. 90 tablet 4   rosuvastatin  (CRESTOR ) 40 MG tablet Take 1 tablet (40 mg total) by mouth at bedtime. 90 tablet 1   tiZANidine  (ZANAFLEX ) 4 MG tablet Take 1 tablet (4 mg total) by mouth every 8 (eight) hours as needed. 90 tablet 5   TRELEGY ELLIPTA  100-62.5-25 MCG/ACT AEPB INHALE 1 PUFF INTO THE LUNGS DAILY. 180 each 1   Vibegron  (GEMTESA ) 75 MG TABS Take 1 tablet (75 mg total) by mouth daily.     Vibegron  (GEMTESA ) 75 MG TABS Take 1 tablet (75 mg total) by mouth daily. 30 tablet 11   zolpidem  (AMBIEN  CR) 6.25 MG CR tablet TAKE 1 TABLET (6.25 MG TOTAL) BY MOUTH AT BEDTIME AS NEEDED FOR SLEEP. 30 tablet  0   No current facility-administered medications for this visit.   No results found.  Review of Systems:   A ROS was performed including pertinent positives and negatives as documented in the HPI.  Physical Exam :   Constitutional: NAD and appears stated age Neurological: Alert and oriented Psych: Appropriate affect and cooperative There were no vitals taken for this visit.   Comprehensive Musculoskeletal Exam:    Patient is nonweightbearing with use of a wheelchair.  Left knee appears swollen with a moderate to large effusion.  Diffuse tenderness with palpation.  Active range of motion is from 10 to 40 degrees.  Slightly decreased sensation in the left lower leg.  Ankle dorsiflexion and plantarflexion is intact although elicits pain.  Pedal pulses palpable.  Imaging:   CT left knee from 11/09/2023 Lateral tibial plateau fracture with evidence of some impaction but no significantly displaced fragments.  Chondrocalcinosis noted within the medial and lateral joint spaces.   I personally reviewed and interpreted the radiographs.      Assessment & Plan Tibial plateau fracture   He has an acute fracture of the left lateral tibial plateau. There is concern for associated ligamentous or meniscal injury and/or nerve involvement therefore will proceed with a stat MRI order of the left knee to assess these issues as CT is suboptimal. Continue using a knee brace and maintain non-weight-bearing status on the left leg. Consider physical therapy for crutch training and mobility. Instruct him to ice the knee for 15-20 minutes several times a day. Continue oxycodone  and supplement with extra strength Tylenol  as needed. Do not discontinue Eliquis .      I personally saw and  evaluated the patient, and participated in the management and treatment plan.  Leonce Reveal, PA-C Orthopedics

## 2023-11-13 ENCOUNTER — Telehealth (HOSPITAL_BASED_OUTPATIENT_CLINIC_OR_DEPARTMENT_OTHER): Payer: Self-pay | Admitting: Student

## 2023-11-13 ENCOUNTER — Telehealth (HOSPITAL_BASED_OUTPATIENT_CLINIC_OR_DEPARTMENT_OTHER): Payer: Self-pay

## 2023-11-13 NOTE — Telephone Encounter (Signed)
 Called and spoke with pt regarding appt tomorrow. Pt agreed to move appt time from 3:15 to 4:15pm.

## 2023-11-13 NOTE — Telephone Encounter (Signed)
 Patient needs MRI referral sent to hospital because he has a device. Let patient know where it is sent.  [phone]

## 2023-11-14 ENCOUNTER — Encounter (HOSPITAL_BASED_OUTPATIENT_CLINIC_OR_DEPARTMENT_OTHER): Payer: Self-pay

## 2023-11-14 ENCOUNTER — Encounter (HOSPITAL_BASED_OUTPATIENT_CLINIC_OR_DEPARTMENT_OTHER)

## 2023-11-14 ENCOUNTER — Ambulatory Visit (HOSPITAL_BASED_OUTPATIENT_CLINIC_OR_DEPARTMENT_OTHER): Admitting: Physical Therapy

## 2023-11-14 ENCOUNTER — Telehealth (HOSPITAL_BASED_OUTPATIENT_CLINIC_OR_DEPARTMENT_OTHER): Payer: Self-pay | Admitting: Student

## 2023-11-14 ENCOUNTER — Encounter (HOSPITAL_BASED_OUTPATIENT_CLINIC_OR_DEPARTMENT_OTHER): Payer: Self-pay | Admitting: Physical Therapy

## 2023-11-14 DIAGNOSIS — R262 Difficulty in walking, not elsewhere classified: Secondary | ICD-10-CM | POA: Diagnosis not present

## 2023-11-14 DIAGNOSIS — M5416 Radiculopathy, lumbar region: Secondary | ICD-10-CM | POA: Diagnosis not present

## 2023-11-14 DIAGNOSIS — M545 Low back pain, unspecified: Secondary | ICD-10-CM | POA: Diagnosis not present

## 2023-11-14 DIAGNOSIS — M25562 Pain in left knee: Secondary | ICD-10-CM

## 2023-11-14 DIAGNOSIS — M6281 Muscle weakness (generalized): Secondary | ICD-10-CM

## 2023-11-14 NOTE — Telephone Encounter (Signed)
 Patient states that he has a 3 week wait  for MRI that he needs sent to Hospital because he has a device and wanted to know if that was going to be a problem. Send referel to Hospital and contact patient  [phone]

## 2023-11-14 NOTE — Telephone Encounter (Signed)
 Returned patients call and informed him that he can call to put on the cancellation list so he can get in before 3 weeks. He stated he just calling to cover his basics to make sure that was ok. Stated he understood.

## 2023-11-14 NOTE — Therapy (Signed)
 OUTPATIENT PHYSICAL THERAPY THORACOLUMBAR EVALUATION   Patient Name: Andrew Townsend MRN: [id number] DOB:1950/09/02, 73 y.o., male Today's Date: 11/14/2023  END OF SESSION:  PT End of Session - 11/14/23 1659     Visit Number 4    Number of Visits 18    Date for PT Re-Evaluation 01/27/24    Authorization Type [employer] MCR    PT Start Time 1615    PT Stop Time 1648    PT Time Calculation (min) 33 min    Activity Tolerance Patient tolerated treatment well;Patient limited by pain    Behavior During Therapy WFL for tasks assessed/performed             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)    Medtronic   Myocardial infarction (HCC)    Stroke (HCC)    Past Surgical History:  Procedure Laterality Date   arm surgery Right    broken - pins placed but now removed   COLONOSCOPY     CORONARY ARTERY BYPASS GRAFT     in [place]   EYE SURGERY Bilateral    remove cataracts   HERNIA REPAIR     ICD IMPLANT     Medtronic   INGUINAL HERNIA REPAIR Bilateral 08/19/2023   Procedure: OPEN BILATERAL INGUINAL HERNIA REPAIR WITH MESH;  Surgeon: Rubin Calamity, MD;  Location: [facility] OR;  Service: General;  Laterality: Bilateral;   JOINT REPLACEMENT Right    shoulder in [place]   MANDIBLE SURGERY     lower jaw surgery for placement of dentures per patient   MULTIPLE TOOTH EXTRACTIONS     all teeth pulled - dentures    TONSILLECTOMY     UPPER GI ENDOSCOPY     WISDOM TOOTH EXTRACTION     Patient Active Problem List   Diagnosis Date Noted   COVID 07/22/2023   Bilateral recurrent inguinal hernia without obstruction or gangrene 05/09/2023   Lumbar radiculopathy 03/26/2023   Diplopia 06/26/2022   Hyperkalemia 04/03/2022   Paroxysmal atrial fibrillation (HCC) 02/11/2022   Cerebral infarction, unspecified (HCC) 01/30/2022   Other idiopathic scoliosis, thoracolumbar region 12/21/2021   Arthritis of sacroiliac joint of both sides (HCC) 12/21/2021   Piriformis syndrome of both sides 12/21/2021   Hx of shoulder surgery (left rotator cuff surgery, right rotator cuff then reverse total shoulder arthoplasty) 12/07/2021   Lumbar facet arthropathy 12/07/2021   Bilateral primary osteoarthritis of knee 12/07/2021   Sacroiliac joint pain 12/07/2021   Gastroesophageal reflux disease 10/27/2021   S/P CABG (coronary artery bypass graft) 05/26/2021   ICD (implantable cardioverter-defibrillator) in place 05/26/2021   COPD (chronic obstructive pulmonary disease) (HCC) 05/26/2021   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   Insomnia 05/26/2021   Atherosclerotic heart disease of native coronary artery with unspecified angina pectoris (HCC) 05/26/2021   BPH (benign prostatic hyperplasia) 03/25/2021   Stage 3b chronic kidney  disease (HCC) 06/16/2018   Hypertension 04/03/2018   Hyperlipidemia 04/03/2018   Chronic pain 04/03/2018   Chronic systolic congestive heart failure (HCC) 04/03/2018   Gout 04/03/2018   Renal artery stenosis (HCC) 04/03/2018   Thrombocytopenia (HCC) 04/03/2018   Anemia in chronic kidney disease 04/03/2018   Ischemic dilated cardiomyopathy (HCC) 06/29/2016   VT (ventricular tachycardia) (HCC) 06/28/2016    PCP:  Chandra Toribio POUR, MD     REFERRING PROVIDER:  Chandra Toribio POUR, MD     REFERRING DIAG:  M54.16 (ICD-10-CM) - Lumbar radiculopathy      Rationale for Evaluation and Treatment:  Rehabilitation  THERAPY DIAG:  Pain, lumbar region  Muscle weakness (generalized)  Difficulty walking  Acute pain of left knee  ONSET DATE: >10 yrs  SUBJECTIVE:                                                                                                                                                                                           SUBJECTIVE STATEMENT:  Pt is here today following recent fall and tibial plateau fracture. Pt has been scooting up and down the stairs. He has a hemi walker, walker, and crutches at home.  Pt has shower chair at home. The majority of the floor at home is carpeted.    Eval: Pt reports LBP and R buttocks pain. Injections into the back and piriformis are hit or miss in terms of relief. They are too expensive. Pt does have a [employer] membership. Pt has been dealing with this condition for quite some time with multiple episodes of PT and chiro. Pain will down into the posterior and will be just above the knee and will start having R calf cramps. Pt is unable to walk a mile like he used to , he is only down to .25 miles now with shooting. Pt is limited to 15 mins of sitting before pain starts. Denies cancer red flags.   PERTINENT HISTORY:  CHF, CAD, COPD, HTN, CKD, recent hernia repair, R TSA,   PAIN:  Are you having pain? Yes: NPRS scale: 7/10 consistently Pain location: R QL, middle lower back, sacral/glute on R> L Pain description: sharp Aggravating factors: extension, sitting for too long,   Relieving factors: chiro, resting, meds, movement, lumbar support   PRECAUTIONS: ICD/Pacemaker last shocked registered on Oct 14, 2023  RED FLAGS: None   WEIGHT BEARING RESTRICTIONS: No  FALLS:  Has patient fallen in last 6 months? No  LIVING ENVIRONMENT: Lives with: lives with their family, sister Lives in: House/apartment Stairs: to enter Has following equipment at home: has walking stick and walker available at home, does  not use    OCCUPATION: retired   PLOF: Independent with basic ADLs  PATIENT GOALS: relieve back pain; wants to hike, wants to build out his mini camper to travel   OBJECTIVE:  Note: Objective measures were completed at Evaluation unless otherwise noted.  DIAGNOSTIC FINDINGS:    Moderate atherosclerotic calcifications within the abdominal aorta. A vascular stent overlies the L1-2 disc level.   IMPRESSION: 1. Moderate to severe levocurvature centered at L2. 2. Severe multilevel degenerative disc and endplate changes. 3. Mild retrolisthesis of L1 on L2.  PATIENT SURVEYS:  ODI: ODI Score = 21 points (42%)  Interpretation: 41% to 60% (severe disability): Pain is a primary problem for these patients, but they may also be experiencing significant problems in travel, personal care, social life, sexual activity and sleep. A detailed evaluation is appropriate.  COGNITION:   Overall cognitive status: WFL              SENSATION: NT into R thigh, neuropathy into distal LE      L knee in knee immobilizer     FUNCTIONAL TESTS: STS requires UE assist; L knee locked in knee immobilizer and NWB throughout transfers      GAIT: Bilat axillary crutches and RW with SBA and gait belt  Stairs: step to pattern with single crutch and R sided hand rail   Observation: scoliotic curves noted in lumbar and thoracic spine                                                                                                                                                                            TODAY'S TREATMENT                                                                          DATE:   6/26  Gait training: AD sizing, maintaining WB precautions, stair sequencing and safety; use of RW for level surfaces inside home and crutches for stair navigation   6/16  UPA L2-5 bilat grade III STM bilat paraspinal and QL R LE LAD with lumbar bias 5 min LAD with PPT   6/12 UPA L2-5 bilat grade III STM  bilat paraspinal and QL R LE LAD with lumbar bias 5 min LAD with PPT   LTR 20x R lateral shift correction 2x10 at wall Lumbar flexion seated with L bias 2x10 Postural changes during the day, neck/shoulder/hip positioning  10/29/23  Exercises - Seated Sciatic Nerve  Glide  - 2 x daily - 7 x weekly - 2 sets - 10 reps - 3 hold - Seated Quadratus Lumborum Stretch in Chair  - 2 x daily - 7 x weekly - 1 sets - 3 reps - 30 hold - Sit to Stand with Resistance Around Legs  - 1 x daily - 7 x weekly - 2 sets - 10 reps   PATIENT EDUCATION:    Education details: MOI, diagnosis, prognosis, anatomy, exercise progression, DOMS expectations, muscle firing,  envelope of function, HEP, POC   Person educated: Patient Education method: Explanation, Demonstration, Verbal cues, and Handouts Education comprehension: verbalized understanding, returned demonstration, and verbal cues required   HOME EXERCISE PROGRAM:    Access Code: [email or web address] URL: [email or web address] Date: 10/29/2023 Prepared by: Dale Call     ASSESSMENT:   CLINICAL IMPRESSION:   Pt presents to session with bilat axillary crutches and L knee immobilizer from recent tibial plateau fracture from falling off of ladder. Pt with well managed pain. Session used to give gait training and safety edu with new NWB status. Pt advised on edema management, self care at home with bathing/transfers, and safety at home with stair management and AD usage. Pt to place PT on hold. Progress note done today to add L knee to current plan of care. Pt would benefit from continued skilled therapy in order to reach goals and maximize functional lumbopelvic strength and ROM for return to  normalized ADL and household activities.      OBJECTIVE IMPAIRMENTS: Abnormal gait, decreased activity tolerance, decreased balance, decreased coordination, decreased endurance, decreased mobility, difficulty walking, decreased ROM, decreased strength, hypomobility,  increased edema, increased fascial restrictions, impaired perceived functional ability, impaired flexibility, improper body mechanics, and pain.    ACTIVITY LIMITATIONS: carrying, lifting, bending, sitting, standing, squatting, stairs, transfers, and locomotion level   PARTICIPATION LIMITATIONS: meal prep, cleaning, laundry, interpersonal relationship, driving, shopping, community activity, and yard work   PERSONAL FACTORS: PMH - chf, macular degeneration, DM, cervical CA are also affecting patient's functional outcome.    REHAB POTENTIAL: fair   CLINICAL DECISION MAKING: complicated/unstable   EVALUATION COMPLEXITY: moderate     GOALS:   SHORT TERM GOALS: Target date: 12/10/2023     Pt will become independent with HEP in order to demonstrate synthesis of PT education.     Goal status: INITIAL    2. Pt will demonstrate at least a 12.8 improvement in Oswestry Index in order to demonstrate a clinically significant change in LBP and function.    Goal status: INITIAL     LONG TERM GOALS: Target date: 01/27/2024        Pt  will become independent with final HEP in order to demonstrate synthesis of PT education.    Goal status: INITIAL   2.  Pt will demonstrate at least a 25pt improvement in Oswestry Index in order to demonstrate a clinically significant change in LBP and function. Goal status: INITIAL   3.  Pt will be able to demonstrate/report ability to sit/stand/sleep for extended periods of time without pain in order to demonstrate functional improvement and tolerance to static positioning.    Goal status: INITIAL   4. Pt will be able to demonstrate/report ability to walk >25 mins without pain in order to demonstrate functional improvement and tolerance to exercise and community mobility.     Goal status: INITIAL     PLAN:   PLAN:   PT FREQUENCY: 1-2x/week   PT DURATION: 12 weeks  PLANNED INTERVENTIONS: Therapeutic exercises, Therapeutic activity,  Neuromuscular re-education, Balance training, Gait training, Patient/Family education, Joint manipulation, Joint mobilization, Stair training, Orthotic/Fit training, DME instructions, Aquatic Therapy, Dry Needling, Electrical stimulation, Spinal manipulation, Spinal mobilization, Cryotherapy, Moist heat, scar mobilization, Splintting, Taping, Vasopneumatic device, Traction, Ultrasound, Ionotophoresis 4mg /ml Dexamethasone , Manual therapy, and Re-evaluation.   PLAN FOR NEXT SESSION: mobilizations; weight room progression, lumbar mobility   Dale Call, PT 11/14/2023, 8:41 PM

## 2023-11-15 ENCOUNTER — Other Ambulatory Visit (HOSPITAL_BASED_OUTPATIENT_CLINIC_OR_DEPARTMENT_OTHER): Payer: Self-pay

## 2023-11-15 DIAGNOSIS — S82122A Displaced fracture of lateral condyle of left tibia, initial encounter for closed fracture: Secondary | ICD-10-CM

## 2023-11-18 ENCOUNTER — Encounter (HOSPITAL_BASED_OUTPATIENT_CLINIC_OR_DEPARTMENT_OTHER)

## 2023-11-19 ENCOUNTER — Encounter: Payer: Self-pay | Admitting: Family Medicine

## 2023-11-19 ENCOUNTER — Ambulatory Visit (INDEPENDENT_AMBULATORY_CARE_PROVIDER_SITE_OTHER): Admitting: Family Medicine

## 2023-11-19 ENCOUNTER — Other Ambulatory Visit: Payer: Self-pay | Admitting: Family Medicine

## 2023-11-19 VITALS — BP 131/66 | HR 70 | Ht 66.0 in | Wt 128.0 lb

## 2023-11-19 DIAGNOSIS — N1832 Chronic kidney disease, stage 3b: Secondary | ICD-10-CM

## 2023-11-19 DIAGNOSIS — S82142D Displaced bicondylar fracture of left tibia, subsequent encounter for closed fracture with routine healing: Secondary | ICD-10-CM | POA: Diagnosis not present

## 2023-11-19 DIAGNOSIS — S82142A Displaced bicondylar fracture of left tibia, initial encounter for closed fracture: Secondary | ICD-10-CM | POA: Insufficient documentation

## 2023-11-19 MED ORDER — OXYCODONE HCL 5 MG PO TABS: 5.0000 mg | ORAL_TABLET | Freq: Four times a day (QID) | ORAL | 0 refills | Status: DC | PRN

## 2023-11-19 NOTE — Patient Instructions (Addendum)
 It was nice to see you today,  We addressed the following topics today: -I have added oxycodone  5 mg for you to use in addition to your Percocet and  buprenorphine  patch for chronic pain. - You can either take the 5 mg with your Percocet or you can space it out so that you are taking the oxycodone  halfway between the Percocet dosing.  Which ever makes you feel less pain. - Continue to keep your weight off it until your orthopedist says it is okay. - Continue to use ice as well for pain relief and swelling.  Have a great day,  Rolan Slain, MD

## 2023-11-19 NOTE — Assessment & Plan Note (Signed)
 Has seen nephrology.  No changes to medications.  Most recent creatinine in the ED appears returned to baseline.

## 2023-11-19 NOTE — Progress Notes (Signed)
 Established Patient Office Visit  Subjective   Patient ID: Andrew Townsend, male    DOB: [birth year]  Age: 73 y.o. MRN: [id number]  Chief Complaint  Patient presents with   Hospitalization Follow-up    HPI  Subjective - Clemens off ladder 10 days ago while cutting broken branch, ladder sunk into dirt and tipped sideways - Tibia plateau fracture with severe pain extending through ankle - Foot completely tingling with no feeling, significant swelling persists - Knee experiencing terrible sharp pains, constant aching - Jaw pain from clenching teeth due to pain - Current pain relief inadequate, Tylenol  not helping - Non-weight bearing as instructed, using crutches but prefers walker - Unable to get comfortable, pain interfering with sleep - Concerned about persistent swelling after 10 days - Can move foot slightly but limited toe movement - Reports existing oxycodone  helps for about 1 hour then pain returns  Medications: Tylenol  500mg  two tablets every 4-6 hours, oxycodone  10mg  every 8 hours (chronic back pain), Butrans  patch (chronic back pain), Eliquis   PMHx: chronic back pain, hernia surgery 2 months ago, previous shoulder replacement (6 month recovery), ICD in place, recent kidney function issues (creatinine elevated to 2.5, now back to baseline 1.5), nephrologist attributed to dehydration  ROS: foot tingling and numbness, knee sharp pains, constant aching, jaw pain from teeth clenching, calf tenderness and soreness, limited mobility of foot and toes   The ASCVD Risk score (Arnett DK, et al., 2019) failed to calculate for the following reasons:   Risk score cannot be calculated because patient has a medical history suggesting prior/existing ASCVD  Health Maintenance Due  Topic Date Due   Hepatitis C Screening  Never done   COVID-19 Vaccine (6 - 2024-25 season) 03/01/2023      Objective:     BP 131/66   Pulse 70   Ht 5' 6 (1.676 m)   Wt 128 lb (58.1 kg)   SpO2 94%   BMI  20.66 kg/m    Physical Exam Gen: alert, oriented.  In wheelchair.  Wearing left knee imobilizer.  Msk: TTP over the left anterior tibia.  Mild swelling in the left knee and foot.  Active ROM in the left foot.     No results found for any visits on 11/19/23.      Assessment & Plan:   Closed fracture of left tibial plateau with routine healing, subsequent encounter Assessment & Plan: Tibia plateau fracture with ongoing pain management needs. Has buprenorphine  patch and percocets for chronic pain.  Current pain control with chronic opioids and Tylenol  providing insufficient relief. Persistent swelling and neurologic symptoms 10 days post-injury. MRI scheduled for 11/26/2023 due to ICD. Compartment syndrome ruled out given time course. Cannot use anti-inflammatories due to Eliquis . - Prescribe oxycodone  5mg  tablets, 7-day supply - Can take 1-2 tablets as needed for breakthrough pain - Option to take with regular percocet for chronic pain or space 4 hours apart - Limit Tylenol  to 1000mg  three times daily (every 8 hours) to avoid exceeding 4000mg  daily limit - Continue non-weight bearing - Remove knee brace when resting/sleeping - Keep leg horizontal when possible to reduce swelling - Continue ice therapy 2-3 times daily, - Return if complete numbness, weakness, or inability to move foot develops - Contact orthopedist first for urgent concerns - Follow up after MRI for surgical vs conservative management decision   Stage 3b chronic kidney disease (HCC) Assessment & Plan: Has seen nephrology.  No changes to medications.  Most recent creatinine in the ED  appears returned to baseline.    Other orders -     oxyCODONE  HCl; Take 1 tablet (5 mg total) by mouth every 6 (six) hours as needed for up to 7 days for severe pain (pain score 7-10) or breakthrough pain.  Dispense: 28 tablet; Refill: 0     Return if symptoms worsen or fail to improve.    Toribio MARLA Slain, MD

## 2023-11-19 NOTE — Assessment & Plan Note (Signed)
 Tibia plateau fracture with ongoing pain management needs. Has buprenorphine  patch and percocets for chronic pain.  Current pain control with chronic opioids and Tylenol  providing insufficient relief. Persistent swelling and neurologic symptoms 10 days post-injury. MRI scheduled for 11/26/2023 due to ICD. Compartment syndrome ruled out given time course. Cannot use anti-inflammatories due to Eliquis . - Prescribe oxycodone  5mg  tablets, 7-day supply - Can take 1-2 tablets as needed for breakthrough pain - Option to take with regular percocet for chronic pain or space 4 hours apart - Limit Tylenol  to 1000mg  three times daily (every 8 hours) to avoid exceeding 4000mg  daily limit - Continue non-weight bearing - Remove knee brace when resting/sleeping - Keep leg horizontal when possible to reduce swelling - Continue ice therapy 2-3 times daily, - Return if complete numbness, weakness, or inability to move foot develops - Contact orthopedist first for urgent concerns - Follow up after MRI for surgical vs conservative management decision

## 2023-11-20 ENCOUNTER — Other Ambulatory Visit: Payer: Self-pay | Admitting: Cardiology

## 2023-11-20 DIAGNOSIS — I480 Paroxysmal atrial fibrillation: Secondary | ICD-10-CM

## 2023-11-20 NOTE — Telephone Encounter (Signed)
 Prescription refill request for Eliquis  received. Indication: ICM Last office visit: 05/03/23  Andrew Finder NP Scr: 1.5 on 11/09/23  Epic Age: 73 Weight: 57kg  Based on above findings Eliquis  2.5mg  twice daily is the appropriate dose.  Refill approved.

## 2023-11-21 ENCOUNTER — Encounter (HOSPITAL_BASED_OUTPATIENT_CLINIC_OR_DEPARTMENT_OTHER)

## 2023-11-21 NOTE — CV Procedure (Signed)
  Device system confirmed to be MRI conditional, with implant date > 6 weeks ago, and no evidence of abandoned or epicardial leads in review of most recent CXR  Device last cleared by EP Provider: Daphne Barrack 11/21/23  Clearance is good through for 1 year as long as parameters remain stable at time of check. If pt undergoes a cardiac device procedure during that time, they should be re-cleared.   Tachy-therapies to be programmed off if applicable with device back to pre-MRI settings after completion of exam.  Medtronic - Programming recommendation received through Medtronic App/Tablet  Andrew Townsend, RT  11/21/2023 8:11 AM

## 2023-11-25 ENCOUNTER — Encounter (HOSPITAL_BASED_OUTPATIENT_CLINIC_OR_DEPARTMENT_OTHER): Admitting: Physical Therapy

## 2023-11-25 ENCOUNTER — Telehealth (HOSPITAL_BASED_OUTPATIENT_CLINIC_OR_DEPARTMENT_OTHER): Payer: Self-pay | Admitting: Student

## 2023-11-25 NOTE — Telephone Encounter (Signed)
 [facility] [place] central billing [phone]  wants to know if patient has aurthoztion for [place] it was for [facility] and patient has device and it can not be done there so patient has to go to [facility]. Patient appointment is tomorrow

## 2023-11-25 NOTE — Telephone Encounter (Signed)
 Samule is currently working on this.

## 2023-11-26 ENCOUNTER — Telehealth (HOSPITAL_BASED_OUTPATIENT_CLINIC_OR_DEPARTMENT_OTHER): Payer: Self-pay | Admitting: Student

## 2023-11-26 ENCOUNTER — Ambulatory Visit (HOSPITAL_COMMUNITY)
Admission: RE | Admit: 2023-11-26 | Discharge: 2023-11-26 | Disposition: A | Discharge status: Home / Self Care | Source: Ambulatory Visit | Attending: Student | Admitting: Student

## 2023-11-26 ENCOUNTER — Other Ambulatory Visit: Payer: Self-pay | Admitting: Family Medicine

## 2023-11-26 NOTE — Telephone Encounter (Unsigned)
 Copied from CRM [id number]. Topic: Clinical - Medication Refill >> Nov 26, 2023  3:21 PM Montie POUR wrote: Medication: oxyCODONE  (OXY IR/ROXICODONE ) 5 MG immediate release tablet   Has the patient contacted their pharmacy? Yes (Agent: If no, request that the patient contact the pharmacy for the refill. If patient does not wish to contact the pharmacy document the reason why and proceed with request.) (Agent: If yes, when and what did the pharmacy advise?) Pharmacy needs order to refill  This is the patient's preferred pharmacy:  [facility] - [place], [place] - [address] [place] [address] Phone: [phone] Fax: [phone]  Is this the correct pharmacy for this prescription? Yes If no, delete pharmacy and type the correct one.   Has the prescription been filled recently? No  Is the patient out of the medication? No - He has 2 pills left  Has the patient been seen for an appointment in the last year OR does the patient have an upcoming appointment? Yes  Can we respond through MyChart? Yes  Agent: Please be advised that Rx refills may take up to 3 business days. We ask that you follow-up with your pharmacy.

## 2023-11-26 NOTE — Telephone Encounter (Signed)
 This was not denied.the facility had to be changed-,this was corrected today due to a mishap that was no in my control.

## 2023-11-26 NOTE — Telephone Encounter (Signed)
 The facility has been updated to [place]

## 2023-11-26 NOTE — Telephone Encounter (Signed)
 Patient states he is in a lot of pain and he wants to know why his MRI was denied. I suggest that he call his insurance to find out why they denied this MRI.

## 2023-11-26 NOTE — Telephone Encounter (Signed)
 Darice from [facility] [phone]  called and  gave prior authorization for  MRI [id number] code [id number] lower extr. The location needs to be change from [facility] to [facility] because the patient has a device Patient MRI was cx for today

## 2023-11-26 NOTE — Telephone Encounter (Signed)
 Spoke with Andrew Townsend and informed her the authorization has been taken care of

## 2023-11-27 ENCOUNTER — Encounter (HOSPITAL_BASED_OUTPATIENT_CLINIC_OR_DEPARTMENT_OTHER): Admitting: Physical Therapy

## 2023-11-27 MED ORDER — OXYCODONE HCL 5 MG PO TABS: 5.0000 mg | ORAL_TABLET | Freq: Four times a day (QID) | ORAL | 0 refills | Status: DC | PRN

## 2023-12-02 ENCOUNTER — Encounter (HOSPITAL_BASED_OUTPATIENT_CLINIC_OR_DEPARTMENT_OTHER): Admitting: Physical Therapy

## 2023-12-04 ENCOUNTER — Ambulatory Visit: Payer: Self-pay

## 2023-12-04 NOTE — Telephone Encounter (Signed)
 FYI Only or Action Required?: Action required by provider: medication refill request.  Patient was last seen in primary care on 11/19/2023 by Andrew Toribio POUR, MD.  Called Nurse Triage reporting Medication Refill.  Symptoms began about a month ago.  Interventions attempted: Prescription medications: Oxycodone .  Symptoms are: unchanged.  Triage Disposition: Call PCP When Office is Open  Patient/caregiver understands and will follow disposition?:      Copied from CRM [id number]. Topic: Clinical - Prescription Issue >> Dec 04, 2023  3:11 PM Avram MATSU wrote: Reason for CRM: patient stated he is still having leg pain and needs a refill for oxyCODONE  (OXY IR/ROXICODONE ) 5 MG immediate release tablet [[id number]]. I see that this medication ended today but the oxyCODONE -acetaminophen  (PERCOCET) 10-325 MG tablet [[id number]] ends on 12/21/23     Reason for Disposition  Caller requesting a CONTROLLED substance prescription refill (e.g., narcotics, ADHD medicines)  Answer Assessment - Initial Assessment Questions 1. DRUG NAME: What medicine do you need to have refilled?     oxyCODONE  (OXY IR/ROXICODONE ) 5 MG immediate release tablet  2. REFILLS REMAINING: How many refills are remaining? Notes: The label on the medicine or pill bottle will show how many refills are remaining. If there are no refills remaining, then a renewal may be needed.     0 4. PRESCRIBER: Who prescribed it? Note: The prescribing doctor or group is responsible for refill approvals..     Dr. Chandra 6. SYMPTOMS: Do you have any symptoms?     Leg pain has slightly improved but is still present  Protocols used: Medication Refill and Renewal Call-A-AH

## 2023-12-05 ENCOUNTER — Encounter (HOSPITAL_BASED_OUTPATIENT_CLINIC_OR_DEPARTMENT_OTHER)

## 2023-12-05 ENCOUNTER — Ambulatory Visit: Attending: Internal Medicine | Admitting: Internal Medicine

## 2023-12-05 ENCOUNTER — Encounter: Payer: Self-pay | Admitting: Internal Medicine

## 2023-12-05 VITALS — BP 120/57 | HR 78 | Ht 66.0 in | Wt 125.0 lb

## 2023-12-05 DIAGNOSIS — I480 Paroxysmal atrial fibrillation: Secondary | ICD-10-CM

## 2023-12-05 NOTE — CV Procedure (Signed)
  Device system confirmed to be MRI conditional, with implant date > 6 weeks ago, and no evidence of abandoned or epicardial leads in review of most recent CXR  Device last cleared by EP Provider: Prentice Passey 12/05/23  Clearance is good through for 1 year as long as parameters remain stable at time of check. If pt undergoes a cardiac device procedure during that time, they should be re-cleared.   Tachy-therapies to be programmed off if applicable with device back to pre-MRI settings after completion of exam.  Medtronic - Programming recommendation received through Medtronic App/Tablet  Izetta CHRISTELLA Linen, RT  12/05/2023 10:41 AM

## 2023-12-05 NOTE — Patient Instructions (Addendum)
 Medication Instructions:  Your physician has recommended you make the following change in your medication:  Take lasix  today and tomorrow. Eat foods with plenty of potassium in them.  Lab Work: None ordered.  You may go to any [facility] Location for your lab work:  [facility] - [address] ([facility]) - [address] [phone] [address]  [place] - [address]   [place]  - [address]   [place] - [address] - [address] [employer]  - [address] - [address] ([facility]   If you have labs (blood work) drawn today and your tests are completely normal, you will receive your results only by: [employer] (if you have MyChart)  If you have any lab test that is abnormal or we need to change your treatment, we will call you or send a MyChart message to review the results.  Testing/Procedures: None ordered.  Follow-Up: At [facility], you and your health needs are our priority.  As part of our continuing mission to provide you with exceptional heart care, we have created designated Provider Care Teams.  These Care Teams include your primary Cardiologist (physician) and Advanced Practice Providers (APPs -  Physician Assistants and Nurse Practitioners) who all work together to provide you with the care you need, when you need it.  We recommend signing up for the patient portal called MyChart.  Sign up information is provided on this After Visit Summary.  MyChart is used to connect with patients for Virtual Visits (Telemedicine).  Patients are able to view lab/test results, encounter notes, upcoming appointments, etc.  Non-urgent messages can be sent to your provider as well.   To learn more about what you can do with MyChart, go to [email or web address].    Your next appointment:   1 year(s)  The format for your next appointment:   In  Person  Provider:   Danelle Birmingham, MD{or one of the following Advanced Practice Providers on your designated Care Team:   Charlies Arthur, [place] Ozell Jodie Passey, [place] Leotis Barrack, NP  Note: Remote monitoring is used to monitor your Pacemaker/ ICD from home. This monitoring reduces the number of office visits required to check your device to one time per year. It allows us  to keep an eye on the functioning of your device to ensure it is working properly.

## 2023-12-06 ENCOUNTER — Ambulatory Visit (HOSPITAL_COMMUNITY)

## 2023-12-06 MED ORDER — OXYCODONE HCL 5 MG PO TABS: 5.0000 mg | ORAL_TABLET | Freq: Four times a day (QID) | ORAL | 0 refills | Status: DC | PRN

## 2023-12-06 NOTE — Progress Notes (Signed)
 HPI [name] returns today for ongoing evaluation of ICM, s/p ICD insertion. He has 3 vessel CAD, s/p CABG with mild/mod LV dysfunction s/p ICD insertion. He has class 2 CHF. He has chronic renal insuff, stage 3. He has not had palpitations, chest pain or sob. He has been found to have about 25% PVC's. He denies ICD therapy.  No Known Allergies   Current Outpatient Medications  Medication Sig Dispense Refill   albuterol  (PROVENTIL ) (2.5 MG/3ML) 0.083% nebulizer solution INHALE THE CONTENTS OF 1 VIAL VIA NEBULIZER EVERY 6 HOURS AS NEEDED FOR WHEEZING OR SHORTNESS OF BREATH. 150 mL 1   albuterol  (VENTOLIN  HFA) 108 (90 Base) MCG/ACT inhaler Inhale 2 puffs into the lungs every 6 (six) hours as needed for wheezing or shortness of breath. 24 g 1   allopurinol  (ZYLOPRIM ) 100 MG tablet Take 1 tablet (100 mg total) by mouth daily. 90 tablet 1   buprenorphine  (BUTRANS ) 20 MCG/HR PTWK Place 1 patch onto the skin once a week. 4 patch 2   docusate sodium (COLACE) 100 MG capsule Take 200 mg by mouth daily as needed for mild constipation.     ELIQUIS  2.5 MG TABS tablet TAKE 1 TABLET BY MOUTH 2 TIMES DAILY. 60 tablet 5   empagliflozin  (JARDIANCE ) 10 MG TABS tablet Take 1 tablet (10 mg total) by mouth daily before breakfast. 90 tablet 3   ezetimibe  (ZETIA ) 10 MG tablet Take 1 tablet (10 mg total) by mouth daily. 90 tablet 1   furosemide  (LASIX ) 40 MG tablet Take 1 tablet (40 mg total) by mouth daily as needed for fluid. 90 tablet 0   gabapentin  (NEURONTIN ) 600 MG tablet Take 2 tablets (1,200 mg total) by mouth at bedtime. 60 tablet 2   Magnesium Oxide (DIASENSE MAGNESIUM PO) Take 1,000 mg by mouth daily at 6 (six) AM.     metoprolol  succinate (TOPROL -XL) 100 MG 24 hr tablet Take 1 tablet (100 mg total) by mouth daily. PLEASE NOTE DECREASED DOSE. 90 tablet 1   Multiple Vitamin (MULTIVITAMIN) tablet Take 1 tablet by mouth daily.     nitroGLYCERIN  (NITROSTAT ) 0.4 MG SL tablet Place 1 tablet (0.4 mg total)  under the tongue every 5 (five) minutes as needed for chest pain. If second dose is needed to relieve chest pain, seek immediate emergency medical attention. 25 tablet 1   omeprazole  (PRILOSEC) 20 MG capsule Take 1 capsule (20 mg total) by mouth daily. 90 capsule 1   oxyCODONE -acetaminophen  (PERCOCET) 10-325 MG tablet Take 1 tablet by mouth every 8 (eight) hours as needed for pain. 90 tablet 0   potassium chloride  (KLOR-CON  M) 10 MEQ tablet TAKE 1 TABLET (10 MEQ TOTAL) BY MOUTH ONCE DAILY. DO NOT CRUSH OR CHEW. 90 tablet 4   rosuvastatin  (CRESTOR ) 40 MG tablet Take 1 tablet (40 mg total) by mouth at bedtime. 90 tablet 1   tiZANidine  (ZANAFLEX ) 4 MG tablet Take 1 tablet (4 mg total) by mouth every 8 (eight) hours as needed. 90 tablet 5   TRELEGY ELLIPTA  100-62.5-25 MCG/ACT AEPB INHALE 1 PUFF INTO THE LUNGS DAILY. 180 each 1   Vibegron  (GEMTESA ) 75 MG TABS Take 1 tablet (75 mg total) by mouth daily.     Vibegron  (GEMTESA ) 75 MG TABS Take 1 tablet (75 mg total) by mouth daily. 30 tablet 11   zolpidem  (AMBIEN  CR) 6.25 MG CR tablet TAKE 1 TABLET (6.25 MG TOTAL) BY MOUTH AT BEDTIME AS NEEDED FOR SLEEP. 30 tablet 1  oxyCODONE  (OXY IR/ROXICODONE ) 5 MG immediate release tablet Take 1 tablet (5 mg total) by mouth every 6 (six) hours as needed for up to 7 days for severe pain (pain score 7-10) or breakthrough pain. 28 tablet 0   No current facility-administered medications for this visit.     Past Medical History:  Diagnosis Date   AICD (automatic cardioverter/defibrillator) present    Medtronic   Anemia    Arthritis    Blood transfusion without reported diagnosis    with heart surgery per patient   CAD (coronary artery disease)    Cataract    had surgery to remove   Chronic kidney disease    stage 3 per patient   COPD (chronic obstructive pulmonary disease) (HCC)    GERD (gastroesophageal reflux disease)    History of kidney stones    passed stones, no surgery per patient   Hyperlipidemia     Hypertension    Insomnia    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)    Medtronic   Myocardial infarction (HCC)    Stroke (HCC)     ROS:   All systems reviewed and negative except as noted in the HPI.   Past Surgical History:  Procedure Laterality Date   arm surgery Right    broken - pins placed but now removed   COLONOSCOPY     CORONARY ARTERY BYPASS GRAFT     in [place]   EYE SURGERY Bilateral    remove cataracts   HERNIA REPAIR     ICD IMPLANT     Medtronic   INGUINAL HERNIA REPAIR Bilateral 08/19/2023   Procedure: OPEN BILATERAL INGUINAL HERNIA REPAIR WITH MESH;  Surgeon: Rubin Calamity, MD;  Location: [facility] OR;  Service: General;  Laterality: Bilateral;   JOINT REPLACEMENT Right    shoulder in [place]   MANDIBLE SURGERY     lower jaw surgery for placement of dentures per patient   MULTIPLE TOOTH EXTRACTIONS     all teeth pulled - dentures   TONSILLECTOMY     UPPER GI ENDOSCOPY     WISDOM TOOTH EXTRACTION       No family history on file.   Social History   Socioeconomic History   Marital status: Widowed    Spouse name: Not on file   Number of children: Not on file   Years of education: Not on file   Highest education level: GED or equivalent  Occupational History   Not on file  Tobacco Use   Smoking status: Former    Current packs/day: 1.00    Types: E-cigarettes, Cigarettes    Quit date: 2015   Smokeless tobacco: Current   Tobacco comments:    Quit smoking cigarettes in 2015 - 1 ppd  Vaping Use   Vaping status: Every Day   Substances: Nicotine  Substance and Sexual Activity   Alcohol use: Not Currently   Drug use: Never   Sexual activity: Not Currently  Other Topics Concern   Not on file  Social History Narrative   Not on file   Social Drivers of Health   Financial Resource Strain: Low Risk  (07/11/2023)   Overall Financial Resource Strain (CARDIA)    Difficulty of Paying Living Expenses: Not hard at  all  Food Insecurity: No Food Insecurity (07/11/2023)   Hunger Vital Sign    Worried About Running Out of Food in the Last Year: Never true    Ran Out of Food in the Last Year: Never true  Transportation Needs: No Transportation Needs (07/11/2023)   PRAPARE - Administrator, Civil Service (Medical): No    Lack of Transportation (Non-Medical): No  Physical Activity: Inactive (07/11/2023)   Exercise Vital Sign    Days of Exercise per Week: 0 days    Minutes of Exercise per Session: 0 min  Stress: No Stress Concern Present (07/11/2023)   [employer] of Occupational Health - Occupational Stress Questionnaire    Feeling of Stress : Not at all  Social Connections: Moderately Isolated (07/11/2023)   Social Connection and Isolation Panel    Frequency of Communication with Friends and Family: Twice a week    Frequency of Social Gatherings with Friends and Family: More than three times a week    Attends Religious Services: Never    Database administrator or Organizations: Yes    Attends Engineer, structural: More than 4 times per year    Marital Status: Widowed  Intimate Partner Violence: Not At Risk (07/11/2023)   Humiliation, Afraid, Rape, and Kick questionnaire    Fear of Current or Ex-Partner: No    Emotionally Abused: No    Physically Abused: No    Sexually Abused: No     BP (!) 120/57   Pulse 78   Ht 5' 6 (1.676 m)   Wt 125 lb (56.7 kg)   SpO2 94%   BMI 20.18 kg/m   Physical Exam:  Well appearing NAD HEENT: Unremarkable Neck:  No JVD, no thyromegally Lymphatics:  No adenopathy Back:  No CVA tenderness Lungs:  Clear HEART:  Regular rate rhythm, no murmurs, no rubs, no clicks Abd:  soft, positive bowel sounds, no organomegally, no rebound, no guarding Ext:  2 plus pulses, no edema, no cyanosis, no clubbing Skin:  No rashes no nodules Neuro:  CN II through XII intact, motor grossly intact  EKG - nsr with frequent PVC's  DEVICE  Normal device  function.  See PaceArt for details.   Assess/Plan:  ICM - He is s/p CABG and denies anginal symptoms. No change in his meds. ICD - he denies any ICD shocks. His medtronic single chamber ICD is working normally.  Chronic systolic heart failure - He has class 2 symptoms. Continue medical therapy. He was a little sob and his fluid index is up a little. I asked him to take his lasix  the next 2 days.  PAF - interrogation of his ICD demonstrates PAF. I have recommended Eliquis  and we will start 2.5 mg twice daily. He will stop the ASA. PVC's - he is mostly asymptomatic. He has multiple PVC morphologies on his 12 lead ECG. I have recommended watchful waiting at this point as the risk benefit ratio goes against amiodarone.    Danelle Jamice Carreno,MD

## 2023-12-09 ENCOUNTER — Encounter (HOSPITAL_BASED_OUTPATIENT_CLINIC_OR_DEPARTMENT_OTHER)

## 2023-12-09 ENCOUNTER — Other Ambulatory Visit: Payer: Self-pay | Admitting: Family Medicine

## 2023-12-09 ENCOUNTER — Other Ambulatory Visit: Payer: Self-pay | Admitting: Student in an Organized Health Care Education/Training Program

## 2023-12-09 MED ORDER — OXYCODONE HCL 5 MG PO TABS
5.0000 mg | ORAL_TABLET | Freq: Three times a day (TID) | ORAL | 0 refills | Status: AC | PRN
Start: 2023-12-09 — End: 2023-12-16

## 2023-12-09 MED ORDER — OXYCODONE HCL 5 MG PO TABS
5.0000 mg | ORAL_TABLET | Freq: Three times a day (TID) | ORAL | 0 refills | Status: DC | PRN
Start: 2023-12-09 — End: 2023-12-09

## 2023-12-09 NOTE — Telephone Encounter (Signed)
 Copied from CRM [id number]. Topic: Clinical - Medication Refill >> Dec 09, 2023  9:56 AM Delon HERO wrote: Medication: Patient script was printed instead of e-script. Please resend the below script oxyCODONE  (OXY IR/ROXICODONE ) 5 MG immediate release tablet [[id number]]  Has the patient contacted their pharmacy? Yes (Agent: If no, request that the patient contact the pharmacy for the refill. If patient does not wish to contact the pharmacy document the reason why and proceed with request.) (Agent: If yes, when and what did the pharmacy advise?)  This is the patient's preferred pharmacy:  [facility] - [place], [place] - [address] Andrew Townsend [place] [address] Phone: [phone] Fax: [phone]     Is this the correct pharmacy for this prescription? Yes If no, delete pharmacy and type the correct one.   Has the prescription been filled recently? Yes  Is the patient out of the medication? Yes  Has the patient been seen for an appointment in the last year OR does the patient have an upcoming appointment? Yes  Can we respond through MyChart? Yes  Agent: Please be advised that Rx refills may take up to 3 business days. We ask that you follow-up with your pharmacy.

## 2023-12-10 ENCOUNTER — Ambulatory Visit (HOSPITAL_COMMUNITY)
Admission: RE | Admit: 2023-12-10 | Discharge: 2023-12-10 | Disposition: A | Discharge status: Home / Self Care | Source: Ambulatory Visit | Attending: Student | Admitting: Student

## 2023-12-10 ENCOUNTER — Ambulatory Visit: Payer: Medicare PPO

## 2023-12-10 DIAGNOSIS — S83232A Complex tear of medial meniscus, current injury, left knee, initial encounter: Secondary | ICD-10-CM | POA: Diagnosis not present

## 2023-12-10 DIAGNOSIS — I255 Ischemic cardiomyopathy: Secondary | ICD-10-CM | POA: Diagnosis not present

## 2023-12-10 DIAGNOSIS — S82122A Displaced fracture of lateral condyle of left tibia, initial encounter for closed fracture: Secondary | ICD-10-CM | POA: Diagnosis not present

## 2023-12-10 DIAGNOSIS — S83272A Complex tear of lateral meniscus, current injury, left knee, initial encounter: Secondary | ICD-10-CM | POA: Diagnosis not present

## 2023-12-10 DIAGNOSIS — M65962 Unspecified synovitis and tenosynovitis, left lower leg: Secondary | ICD-10-CM | POA: Diagnosis not present

## 2023-12-10 DIAGNOSIS — S82142A Displaced bicondylar fracture of left tibia, initial encounter for closed fracture: Secondary | ICD-10-CM | POA: Diagnosis not present

## 2023-12-11 ENCOUNTER — Ambulatory Visit: Payer: Self-pay | Admitting: Internal Medicine

## 2023-12-11 ENCOUNTER — Encounter (HOSPITAL_BASED_OUTPATIENT_CLINIC_OR_DEPARTMENT_OTHER)

## 2023-12-11 LAB — CUP PACEART REMOTE DEVICE CHECK
Battery Remaining Longevity: 36 mo
Battery Voltage: 2.96 V
Brady Statistic RV Percent Paced: 8.65 %
Date Time Interrogation Session: 20250722001807
HighPow Impedance: 64 Ohm
Implantable Lead Connection Status: 753985
Implantable Lead Implant Date: 20180209
Implantable Lead Location: 753860
Implantable Lead Model: 6935
Implantable Pulse Generator Implant Date: 20180209
Lead Channel Impedance Value: 247 Ohm
Lead Channel Impedance Value: 304 Ohm
Lead Channel Sensing Intrinsic Amplitude: 2.625 mV
Lead Channel Sensing Intrinsic Amplitude: 2.625 mV
Lead Channel Setting Pacing Amplitude: 2 V
Lead Channel Setting Pacing Pulse Width: 0.5 ms
Lead Channel Setting Sensing Sensitivity: 0.3 mV
Zone Setting Status: 755011

## 2023-12-12 ENCOUNTER — Ambulatory Visit: Attending: Student in an Organized Health Care Education/Training Program | Admitting: Nurse Practitioner

## 2023-12-12 ENCOUNTER — Encounter: Payer: Self-pay | Admitting: Nurse Practitioner

## 2023-12-12 DIAGNOSIS — G5703 Lesion of sciatic nerve, bilateral lower limbs: Secondary | ICD-10-CM | POA: Diagnosis not present

## 2023-12-12 DIAGNOSIS — M47816 Spondylosis without myelopathy or radiculopathy, lumbar region: Secondary | ICD-10-CM | POA: Insufficient documentation

## 2023-12-12 DIAGNOSIS — M4125 Other idiopathic scoliosis, thoracolumbar region: Secondary | ICD-10-CM | POA: Insufficient documentation

## 2023-12-12 DIAGNOSIS — Z9889 Other specified postprocedural states: Secondary | ICD-10-CM | POA: Diagnosis not present

## 2023-12-12 DIAGNOSIS — G629 Polyneuropathy, unspecified: Secondary | ICD-10-CM | POA: Diagnosis not present

## 2023-12-12 DIAGNOSIS — G894 Chronic pain syndrome: Secondary | ICD-10-CM | POA: Diagnosis not present

## 2023-12-12 DIAGNOSIS — M461 Sacroiliitis, not elsewhere classified: Secondary | ICD-10-CM | POA: Diagnosis not present

## 2023-12-12 DIAGNOSIS — Z79891 Long term (current) use of opiate analgesic: Secondary | ICD-10-CM | POA: Diagnosis not present

## 2023-12-12 MED ORDER — OXYCODONE-ACETAMINOPHEN 10-325 MG PO TABS: 1.0000 | ORAL_TABLET | Freq: Three times a day (TID) | ORAL | 0 refills | Status: AC | PRN

## 2023-12-12 MED ORDER — OXYCODONE-ACETAMINOPHEN 10-325 MG PO TABS: 1.0000 | ORAL_TABLET | Freq: Three times a day (TID) | ORAL | 0 refills | Status: DC | PRN

## 2023-12-12 MED ORDER — TIZANIDINE HCL 4 MG PO TABS: 4.0000 mg | ORAL_TABLET | Freq: Four times a day (QID) | ORAL | 5 refills | Status: DC | PRN

## 2023-12-12 NOTE — Progress Notes (Signed)
 Nursing Pain Medication Assessment:  Safety precautions to be maintained throughout the outpatient stay will include: orient to surroundings, keep bed in low position, maintain call bell within reach at all times, provide assistance with transfer out of bed and ambulation.  Medication Inspection Compliance: Pill count conducted under aseptic conditions, in front of the patient. Neither the pills nor the bottle was removed from the patient's sight at any time. Once count was completed pills were immediately returned to the patient in their original bottle.  Medication: Buprenorphine  patch (Butrans ) Pill/Patch Count: 1 of 4 pills/patches remain Pill/Patch Appearance: Markings consistent with prescribed medication Bottle Appearance: Standard pharmacy container. Clearly labeled. Filled Date: 11/19/23 2025 Last Medication intake:  1 week ago  Oxycodone /acetaminophen  27/90 Filled 11-21-23 Today  Oxycodone  5 mg Given by Dr Flynn for fx left tibial plateau

## 2023-12-12 NOTE — Patient Instructions (Signed)
 Fill dates 12-21-23 01-20-24 02-19-2024

## 2023-12-12 NOTE — Progress Notes (Signed)
 PROVIDER NOTE: Interpretation of information contained herein should be left to medically-trained personnel. Specific patient instructions are provided elsewhere under Patient Instructions section of medical record. This document was created in part using AI and STT-dictation technology, any transcriptional errors that may result from this process are unintentional.  Patient: Andrew Townsend  Service: E/M   PCP: Chandra Toribio POUR, MD  DOB: 1951-02-01  DOS: 12/12/2023  Provider: Emmy POUR Blanch, NP  MRN: [id number]  Delivery: Face-to-face  Specialty: Interventional Pain Management  Type: Established Patient  Setting: Ambulatory outpatient facility  Specialty designation: 09  Referring Prov.: Chandra Toribio POUR, MD  Location: Outpatient office facility       History of present illness (HPI) [name], a 73 y.o. year old male, is here today because of his No primary diagnosis found.. [name] primary complain today is Back Pain  Pertinent problems: 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 Location: Back Lower/radiates down buttocks and right leg. Onset: More than a month ago. Quality: Aching, Stabbing. Timing: Constant. Modifying factor(s): meds. Vitals:  height is 5' 6 (1.676 m) and weight is 125 lb (56.7 kg). His temperature is 97.7 F (36.5 C). His blood pressure is 148/73 (abnormal) and his pulse is 66. His respiration is 20 and oxygen saturation is 99%.  BMI: Estimated body mass index is 20.18 kg/m as  calculated from the following:   Height as of this encounter: 5' 6 (1.676 m).   Weight as of this encounter: 125 lb (56.7 kg).  Last encounter: 09/12/2023 Last procedure: Visit date not found.  Reason for encounter: medication management. No change in medical history since last visit.  Patient's pain is at baseline.  Patient continues multimodal pain regimen as prescribed.  States that it provides pain relief and improvement in functional status.   The patient fell from a ladder onto dirt.  He was evaluated at urgent care, where an x-ray was obtained showing concern for tibial plateau fracture.  He was referred to the emergency department (ED), where an MRI of the left knee was performed.  He has a follow-up appointment scheduled with an orthopedic specialist.  The patient presented today with a brace on his left knee and is using a walker for ambulation.  He reports using crutches at home, which aggravate his low back pain.  He requests an increase in the frequency of tizanidine , as he finds it more effective in relieving his low back pain.  Pharmacotherapy Assessment   Oxycodone -acetaminophen  (Percocet) 10-325 mg tablet every 8 hours as needed for pain. MME=45 Tizanidine  (Zanaflex ) 4 mg every 6 hours as needed for musculoskeletal pain. Monitoring: [place] PMP: PDMP reviewed during this encounter.       Pharmacotherapy: No side-effects or adverse reactions reported. Compliance: No problems identified. Effectiveness: Clinically acceptable.  Rebecka Wolm HERO, RN  12/12/2023 11:24 AM  Sign when Signing Visit Nursing Pain Medication Assessment:  Safety precautions to be maintained throughout the outpatient stay will include: orient to surroundings, keep bed in low position, maintain call bell within reach at all times, provide assistance with transfer out of bed  and ambulation.  Medication Inspection Compliance: Pill count conducted under aseptic conditions, in front of the patient. Neither the pills nor  the bottle was removed from the patient's sight at any time. Once count was completed pills were immediately returned to the patient in their original bottle.  Medication: Buprenorphine  patch (Butrans ) Pill/Patch Count: 1 of 4 pills/patches remain Pill/Patch Appearance: Markings consistent with prescribed medication Bottle Appearance: Standard pharmacy container. Clearly labeled. Filled Date: 11/19/23 2025 Last Medication intake:  1 week ago  Oxycodone /acetaminophen  27/90 Filled 11-21-23 Today  Oxycodone  5 mg Given by Dr Flynn for fx left tibial plateau  UDS:  Summary  Date Value Ref Range Status  12/18/2022 Note  Final    Comment:    ==================================================================== [employer] [facility] 13 (MW) ==================================================================== Test                             Result       Flag       Units  Drug Present and Declared for Prescription Verification   Oxycodone                       3473         EXPECTED   ng/mg creat   Oxymorphone                    1617         EXPECTED   ng/mg creat   Noroxycodone                   4722         EXPECTED   ng/mg creat   Noroxymorphone                 992          EXPECTED   ng/mg creat    Sources of oxycodone  are scheduled prescription medications.    Oxymorphone, noroxycodone, and noroxymorphone are expected    metabolites of oxycodone . Oxymorphone is also available as a    scheduled prescription medication.    Buprenorphine                   21           EXPECTED   ng/mg creat   Norbuprenorphine               36           EXPECTED   ng/mg creat    Source of buprenorphine  is a scheduled prescription medication.    Norbuprenorphine is an expected metabolite of buprenorphine .  ==================================================================== Test                      Result    Flag   Units      Ref Range   Creatinine              89               mg/dL       >=79 ==================================================================== Declared Medications:  The flagging and interpretation on this report are based on the  following declared medications.  Unexpected results may arise from  inaccuracies in the declared medications.   **Note: The testing scope of this panel includes these medications:   Oxycodone  (Percocet)   **Note: The testing scope of this panel does not include small to  moderate amounts of these reported  medications:   Buprenorphine  Patch (BuTrans )   **Note: The testing scope of this panel does not include the  following reported medications:   Acetaminophen  (Percocet)  Albuterol   Allopurinol   Apixaban  (Eliquis )  Empagliflozin  (Jardiance )  Ezetimibe  (Zetia )  Fluticasone (Trelegy)  Furosemide  (Lasix )  Gabapentin  (Neurontin )  Magnesium (Mag-Ox)  Metoprolol   Multivitamin  Nitroglycerin  (Nitrostat )  Omeprazole   Oxybutynin   Potassium (Klor-Con )  Rosuvastatin   Tizanidine   Umeclidinium (Trelegy)  Vilanterol (Trelegy)  Zolpidem  (Ambien ) ==================================================================== For clinical consultation, please call [phone]. ====================================================================     No results found for: CBDTHCR No results found for: D8THCCBX No results found for: D9THCCBX  ROS  Constitutional: Denies any fever or chills Gastrointestinal: No reported hemesis, hematochezia, vomiting, or acute GI distress Musculoskeletal: Back pain, left knee pain and swelling Neurological: No reported episodes of acute onset apraxia, aphasia, dysarthria, agnosia, amnesia, paralysis, loss of coordination, or loss of consciousness  Medication Review  Fluticasone-Umeclidin-Vilant, Magnesium Oxide, Vibegron , albuterol , allopurinol , apixaban , buprenorphine , docusate sodium, empagliflozin , ezetimibe , furosemide , gabapentin , metoprolol  succinate, multivitamin, nitroGLYCERIN ,  omeprazole , oxyCODONE , oxyCODONE -acetaminophen , potassium chloride , rosuvastatin , tiZANidine , and zolpidem   History Review  Allergy: [name] has no known allergies. Drug: [name]  reports no history of drug use. Alcohol:  reports that he does not currently use alcohol. Tobacco:  reports that he quit smoking about 10 years ago. His smoking use included e-cigarettes and cigarettes. He uses smokeless tobacco. Social: [name]  reports that he quit smoking about 10 years ago. His smoking use included e-cigarettes and cigarettes. He uses smokeless tobacco. He reports that he does not currently use alcohol. He reports that he does not use drugs. 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 Surgical: [name]  has a past surgical history that includes Coronary artery bypass graft; Hernia repair; Joint replacement (Right); Tonsillectomy; Wisdom tooth extraction; Upper gi endoscopy; Colonoscopy; arm surgery (Right); ICD IMPLANT; Eye surgery (Bilateral); Multiple tooth extractions; Mandible surgery; and Inguinal hernia repair (Bilateral, 08/19/2023). Family: family history is not on file.  Laboratory Chemistry Profile   Renal Lab Results  Component Value Date   BUN 17 11/09/2023   CREATININE 1.50 (H) 11/09/2023   BCR 10 10/18/2023   GFRNONAA 49 (L) 11/09/2023    Hepatic Lab Results  Component Value Date   AST 26 04/17/2023   ALT 32 04/17/2023   ALBUMIN 3.9 10/18/2023   ALKPHOS 99 04/17/2023    Electrolytes Lab Results  Component Value Date   NA  137 11/09/2023   K 4.2 11/09/2023   CL 99 11/09/2023   CALCIUM  9.6 11/09/2023   MG 2.1 07/31/2022   PHOS 3.6 10/18/2023    Bone Lab Results  Component Value Date   VD25OH 64.6 11/07/2022    Inflammation (CRP: Acute Phase) (ESR: Chronic Phase) Lab Results  Component Value Date   CRP 18 (H) 06/26/2022   ESRSEDRATE 6 06/26/2022         Note: Above Lab results reviewed.  Recent Imaging Review  CUP PACEART REMOTE DEVICE CHECK ICD Scheduled remote reviewed. Normal device function.  Presenting rhythm: VS-VP. Observation: VF ATP + Charging therapies will not be delivered. Deliver ATP if last 8 R-R >= in Params/VF Therapies/ATP is outside of the VF detection zone or  overlapped by the FVT Interval.  VT Zone 171-250 bpm, FVT Zone 214-250 ms, VF Zone > 250  bpm.  Routed to Alert group for review of VT/VF Therapy Zones.  1 VT-NS classified episode on 12/06/23 at 09:20, 6 beatsc/w NSVT at 188 bpm.  38 AF classified episodes, longest 52 min on 12/08/23, Burden 7.3% since 12/05/23, on Eliquis  per Epic.  Next remote 91 days. MC, CVRS ICD Scheduled remote reviewed. Normal device function.  Presenting rhythm: VS-VP. Observation: VF ATP + Charging therapies will not be delivered. Deliver ATP if last 8 R-R >= in Params/VF Therapies/ATP is outside of the VF detection zone or overlapped by the FVT Interval.  VT Zone 171-250 bpm, FVT Zone 214-250 ms, VF Zone > 250  bpm.  Routed to Alert group for review of VT/VF Therapy Zones.  1 VT-NS classified episode on 12/06/23 at 09:20, 6 beatsc/w NSVT at 188 bpm.  38 AF classified episodes, longest 52 min on 12/08/23, Burden 7.3% since 12/05/23, on Eliquis  per Epic.  Next remote 91 days.  MC, CVRS Note: Reviewed        Physical Exam  Vitals: BP (!) 148/73   Pulse 66   Temp 97.7 F (36.5 C)   Resp 20   Ht 5' 6 (1.676 m)   Wt 125 lb (56.7 kg)   SpO2 99%   BMI 20.18 kg/m  BMI: Estimated body mass index is 20.18 kg/m as calculated from the  following:   Height as of this encounter: 5' 6 (1.676 m).   Weight as of this encounter: 125 lb (56.7 kg). Ideal: Ideal body weight: 63.8 kg (140 lb 10.5 oz) General appearance: Well nourished, well developed, and well hydrated. In no apparent acute distress Mental status: Alert, oriented x 3 (person, place, & time)       Respiratory: No evidence of acute respiratory distress Eyes: PERLA  Musculoskeletal: Uses walker and crutch for ambulation for left knee injury Assessment   Diagnosis Status  1. 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 Piriformis syndrome of both sides   3. Chronic pain syndrome   4. Lumbar facet arthropathy   5. Encounter for long-term opiate analgesic use   6. Neuropathy   7. Other idiopathic scoliosis, thoracolumbar region   8. Hx of shoulder surgery (left rotator cuff surgery, right rotator cuff then reverse total shoulder arthoplasty)    Controlled Controlled Controlled   Updated Problems: No problems updated.  Plan of Care  Problem-specific:  Assessment and Plan Will continue on current medication regimen.  Prescribing drug monitoring (PDMP) reviewed; findings consistent with the use of prescribed medication and no evidence of narcotic misuse or abuse.  Urine drug screening (UDS up-to-date.  Schedule follow-up in 90 days for medication management.   [name] has a current medication list which includes the following long-term medication(s): albuterol , albuterol , allopurinol , eliquis , ezetimibe , furosemide , gabapentin , metoprolol  succinate, nitroglycerin , omeprazole , potassium chloride , rosuvastatin , tizanidine , and zolpidem .  Pharmacotherapy (Medications Ordered): No orders of the defined types were placed in this encounter.  Orders:  No orders of the defined types were placed in this encounter.       No follow-ups on file.    Recent Visits No visits were found meeting these conditions. Showing recent visits within past 90  days and meeting all other requirements Today's Visits Date Type Provider Dept  12/12/23 Office Visit Bertha Earwood K, NP [facility]  Showing today's visits and meeting all other requirements Future Appointments  No visits were found meeting these conditions. Showing future appointments within next 90 days and meeting all other requirements  I discussed the assessment and treatment plan with the patient. The patient was provided an opportunity to ask questions and all were answered. The patient agreed with the plan and demonstrated an understanding of the instructions.  Patient advised to call back or seek an in-person evaluation if the symptoms or condition worsens.  Duration of encounter: 30 minutes.  Total time on encounter, as per AMA guidelines included both the face-to-face and non-face-to-face time personally spent by the physician and/or other qualified health care professional(s) on the day of the encounter (includes time in activities that require the physician or other qualified health care professional and does not include time in activities normally performed by clinical staff). Physician's time may include the following activities when performed: Preparing to see the patient (e.g., pre-charting review of records, searching for previously ordered imaging, lab work, and nerve conduction tests) Review of prior analgesic pharmacotherapies. Reviewing PMP Interpreting ordered tests (e.g., lab work, imaging, nerve conduction tests) Performing post-procedure evaluations, including interpretation of diagnostic procedures Obtaining and/or reviewing separately obtained history Performing a medically appropriate examination and/or evaluation Counseling and educating the patient/family/caregiver Ordering medications, tests, or procedures Referring and communicating with other health care professionals (when not separately reported) Documenting clinical information in the electronic  or other health record Independently interpreting results (not separately reported) and communicating results to the patient/ family/caregiver Care coordination (not separately reported)  Note by: Francee Setzer K Adamaris King, NP (TTS and AI technology used. I apologize for any typographical errors that were not detected and corrected.) Date: 12/12/2023; Time: 11:30 AM

## 2023-12-16 ENCOUNTER — Ambulatory Visit (HOSPITAL_BASED_OUTPATIENT_CLINIC_OR_DEPARTMENT_OTHER)

## 2023-12-16 ENCOUNTER — Ambulatory Visit (HOSPITAL_BASED_OUTPATIENT_CLINIC_OR_DEPARTMENT_OTHER): Admitting: Orthopaedic Surgery

## 2023-12-16 ENCOUNTER — Ambulatory Visit (HOSPITAL_COMMUNITY)
Admission: RE | Admit: 2023-12-16 | Discharge: 2023-12-16 | Disposition: A | Discharge status: Home / Self Care | Source: Ambulatory Visit | Attending: Orthopaedic Surgery | Admitting: Orthopaedic Surgery

## 2023-12-16 DIAGNOSIS — S82122A Displaced fracture of lateral condyle of left tibia, initial encounter for closed fracture: Secondary | ICD-10-CM | POA: Insufficient documentation

## 2023-12-16 DIAGNOSIS — S82142A Displaced bicondylar fracture of left tibia, initial encounter for closed fracture: Secondary | ICD-10-CM | POA: Diagnosis not present

## 2023-12-16 DIAGNOSIS — M7732 Calcaneal spur, left foot: Secondary | ICD-10-CM | POA: Diagnosis not present

## 2023-12-16 DIAGNOSIS — M19072 Primary osteoarthritis, left ankle and foot: Secondary | ICD-10-CM | POA: Diagnosis not present

## 2023-12-16 NOTE — Progress Notes (Signed)
 Chief Complaint: Left knee injury         History of Present Illness 12/16/2023: Vernal today for follow-up and MRI discussion   Three days ago, he sustained a tibial plateau fracture after falling from the fifth rung of an eight-foot A-frame ladder. X-rays and a CT scan revealed a fracture of the lateral tibial plateau. He experiences significant knee pain, ranging from 3-4 at rest to 8-9 with movement. He has difficulty using crutches and slides on his buttocks to navigate stairs due to fear of falling. A recent fall occurred when he attempted to get out of bed without his brace, resulting in a loss of balance. He is concerned about navigating his home, especially carpeted areas, and accessing the upstairs bathroom.   He reports numbness in his foot and difficulty wiggling his toes, with minimal sensation in the toes and significant numbness in the foot. Ankle movement is limited and painful, with pain radiating up the leg. He is currently using oxycodone  10 mg a couple of times a day for pain management and is on Eliquis .     Surgical History:   None   PMH/PSH/Family History/Social History/Meds/Allergies:         Past Medical History:  Diagnosis Date   AICD (automatic cardioverter/defibrillator) present      Medtronic   Anemia     Arthritis     Blood transfusion without reported diagnosis      with heart surgery per patient   CAD (coronary artery disease)     Cataract      had surgery to remove   Chronic kidney disease      stage 3 per patient   COPD (chronic obstructive pulmonary disease) (HCC)     GERD (gastroesophageal reflux disease)     History of kidney stones      passed stones, no surgery per patient   Hyperlipidemia     Hypertension     Insomnia     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)      Medtronic   Myocardial infarction (HCC)     Stroke (HCC)               Past Surgical History:  Procedure  Laterality Date   arm surgery Right      broken - pins placed but now removed   COLONOSCOPY       CORONARY ARTERY BYPASS GRAFT        in [place]   EYE SURGERY Bilateral      remove cataracts   HERNIA REPAIR       ICD IMPLANT        Medtronic   INGUINAL HERNIA REPAIR Bilateral 08/19/2023    Procedure: OPEN BILATERAL INGUINAL HERNIA REPAIR WITH MESH;  Surgeon: Rubin Calamity, MD;  Location: [facility] OR;  Service: General;  Laterality: Bilateral;   JOINT REPLACEMENT Right      shoulder in [place]   MANDIBLE SURGERY        lower jaw surgery for placement of dentures per patient   MULTIPLE TOOTH EXTRACTIONS        all teeth pulled - dentures   TONSILLECTOMY       UPPER GI ENDOSCOPY       WISDOM TOOTH EXTRACTION            Social History         Socioeconomic History   Marital status: Widowed      Spouse name: Not on file  Number of children: Not on file   Years of education: Not on file   Highest education level: GED or equivalent  Occupational History   Not on file  Tobacco Use   Smoking status: Former      Current packs/day: 1.00      Types: E-cigarettes, Cigarettes      Quit date: 2015   Smokeless tobacco: Current   Tobacco comments:      Quit smoking cigarettes in 2015 - 1 ppd  Vaping Use   Vaping status: Every Day   Substances: Nicotine  Substance and Sexual Activity   Alcohol use: Not Currently   Drug use: Never   Sexual activity: Not Currently  Other Topics Concern   Not on file  Social History Narrative   Not on file    Social Drivers of Health        Financial Resource Strain: Low Risk  (07/11/2023)    Overall Financial Resource Strain (CARDIA)     Difficulty of Paying Living Expenses: Not hard at all  Food Insecurity: No Food Insecurity (07/11/2023)    Hunger Vital Sign     Worried About Running Out of Food in the Last Year: Never true     Ran Out of Food in the Last Year: Never true  Transportation Needs: No Transportation Needs (07/11/2023)     PRAPARE - Therapist, art (Medical): No     Lack of Transportation (Non-Medical): No  Physical Activity: Inactive (07/11/2023)    Exercise Vital Sign     Days of Exercise per Week: 0 days     Minutes of Exercise per Session: 0 min  Stress: No Stress Concern Present (07/11/2023)    [employer] of Occupational Health - Occupational Stress Questionnaire     Feeling of Stress : Not at all  Social Connections: Moderately Isolated (07/11/2023)    Social Connection and Isolation Panel     Frequency of Communication with Friends and Family: Twice a week     Frequency of Social Gatherings with Friends and Family: More than three times a week     Attends Religious Services: Never     Database administrator or Organizations: Yes     Attends Engineer, structural: More than 4 times per year     Marital Status: Widowed    No family history on file.     Allergies  No Known Allergies         Current Outpatient Medications  Medication Sig Dispense Refill   albuterol  (PROVENTIL ) (2.5 MG/3ML) 0.083% nebulizer solution INHALE THE CONTENTS OF 1 VIAL VIA NEBULIZER EVERY 6 HOURS AS NEEDED FOR WHEEZING OR SHORTNESS OF BREATH. 150 mL 1   albuterol  (VENTOLIN  HFA) 108 (90 Base) MCG/ACT inhaler Inhale 2 puffs into the lungs every 6 (six) hours as needed for wheezing or shortness of breath. 24 g 1   allopurinol  (ZYLOPRIM ) 100 MG tablet Take 1 tablet (100 mg total) by mouth daily. 90 tablet 1   apixaban  (ELIQUIS ) 2.5 MG TABS tablet TAKE 1 TABLET BY MOUTH 2 TIMES DAILY. 60 tablet 5   buprenorphine  (BUTRANS ) 20 MCG/HR PTWK Place 1 patch onto the skin once a week. 4 patch 2   docusate sodium (COLACE) 100 MG capsule Take 200 mg by mouth daily as needed for mild constipation.       empagliflozin  (JARDIANCE ) 10 MG TABS tablet Take 1 tablet (10 mg total) by mouth daily before breakfast. 90 tablet  3   ezetimibe  (ZETIA ) 10 MG tablet Take 1 tablet (10 mg total) by mouth daily. 90  tablet 1   furosemide  (LASIX ) 40 MG tablet Take 1 tablet (40 mg total) by mouth daily as needed for fluid. 90 tablet 0   gabapentin  (NEURONTIN ) 600 MG tablet Take 2 tablets (1,200 mg total) by mouth at bedtime. 60 tablet 2   Magnesium Oxide (DIASENSE MAGNESIUM PO) Take 1,000 mg by mouth daily at 6 (six) AM.       metoprolol  succinate (TOPROL -XL) 100 MG 24 hr tablet Take 1 tablet (100 mg total) by mouth daily. PLEASE NOTE DECREASED DOSE. 90 tablet 1   Multiple Vitamin (MULTIVITAMIN) tablet Take 1 tablet by mouth daily.       nitroGLYCERIN  (NITROSTAT ) 0.4 MG SL tablet Place 1 tablet (0.4 mg total) under the tongue every 5 (five) minutes as needed for chest pain. If second dose is needed to relieve chest pain, seek immediate emergency medical attention. 25 tablet 1   omeprazole  (PRILOSEC) 20 MG capsule Take 1 capsule (20 mg total) by mouth daily. 90 capsule 1   oxyCODONE -acetaminophen  (PERCOCET) 10-325 MG tablet Take 1 tablet by mouth every 8 (eight) hours as needed for pain. 90 tablet 0   [START ON 11/21/2023] oxyCODONE -acetaminophen  (PERCOCET) 10-325 MG tablet Take 1 tablet by mouth every 8 (eight) hours as needed for pain. 90 tablet 0   potassium chloride  (KLOR-CON  M) 10 MEQ tablet TAKE 1 TABLET (10 MEQ TOTAL) BY MOUTH ONCE DAILY. DO NOT CRUSH OR CHEW. 90 tablet 4   rosuvastatin  (CRESTOR ) 40 MG tablet Take 1 tablet (40 mg total) by mouth at bedtime. 90 tablet 1   tiZANidine  (ZANAFLEX ) 4 MG tablet Take 1 tablet (4 mg total) by mouth every 8 (eight) hours as needed. 90 tablet 5   TRELEGY ELLIPTA  100-62.5-25 MCG/ACT AEPB INHALE 1 PUFF INTO THE LUNGS DAILY. 180 each 1   Vibegron  (GEMTESA ) 75 MG TABS Take 1 tablet (75 mg total) by mouth daily.       Vibegron  (GEMTESA ) 75 MG TABS Take 1 tablet (75 mg total) by mouth daily. 30 tablet 11   zolpidem  (AMBIEN  CR) 6.25 MG CR tablet TAKE 1 TABLET (6.25 MG TOTAL) BY MOUTH AT BEDTIME AS NEEDED FOR SLEEP. 30 tablet 0      No current facility-administered  medications for this visit.      Imaging Results (Last 48 hours)  No results found.     Review of Systems:   A ROS was performed including pertinent positives and negatives as documented in the HPI.   Physical Exam :   Constitutional: NAD and appears stated age Neurological: Alert and oriented Psych: Appropriate affect and cooperative There were no vitals taken for this visit.    Comprehensive Musculoskeletal Exam:     Patient is nonweightbearing with use of a wheelchair.  Left knee appears swollen with a moderate to large effusion.  Diffuse tenderness with palpation.  Active range of motion is from 10 to 40 degrees.  Slightly decreased sensation in the left lower leg.  Ankle dorsiflexion and plantarflexion is intact although elicits pain.  Pedal pulses palpable.   Imaging:   CT left knee from 11/09/2023 Lateral tibial plateau fracture with evidence of some impaction but no significantly displaced fragments.  Chondrocalcinosis noted within the medial and lateral joint spaces.     I personally reviewed and interpreted the radiographs.          Assessment & Plan Tibial plateau fracture  He has an acute fracture of the left lateral tibial plateau.  Overall MRI does not show significant fracture displacement and as result he may begin to weight-bear as tolerated.  I do believe he ultimately does need aggressive range of motion and weightbearing with physical therapy.  I do believe he would be a candidate for aquatic therapy.  At this time I do not believe he will need any type of surgical intervention.  I will plan to see him back in a month to check his progress.  Also plan to send him for a DVT ultrasound given his calf pain

## 2023-12-18 ENCOUNTER — Other Ambulatory Visit: Payer: Self-pay | Admitting: Student in an Organized Health Care Education/Training Program

## 2023-12-20 ENCOUNTER — Telehealth: Payer: Self-pay | Admitting: Nurse Practitioner

## 2023-12-20 NOTE — Telephone Encounter (Signed)
 Refills were not put in for Gabapentin  and Butrans . Patient lvmail asking for them. He was just here this week for MM with GORMAN Blanch

## 2023-12-20 NOTE — Telephone Encounter (Signed)
 Patient is stating that he was just here on [id number] and Gabapentin  and Butrans  patches were not ordered and needs them. All I saw in chart was ordered by Dr Marcelino on 09/12/23

## 2023-12-23 ENCOUNTER — Other Ambulatory Visit: Payer: Self-pay | Admitting: Nurse Practitioner

## 2023-12-23 MED ORDER — BUPRENORPHINE 20 MCG/HR TD PTWK: 1.0000 | MEDICATED_PATCH | TRANSDERMAL | 2 refills | Status: DC

## 2023-12-23 MED ORDER — GABAPENTIN 600 MG PO TABS: 1200.0000 mg | ORAL_TABLET | Freq: Every day | ORAL | 2 refills | Status: DC

## 2023-12-24 ENCOUNTER — Encounter (HOSPITAL_BASED_OUTPATIENT_CLINIC_OR_DEPARTMENT_OTHER): Payer: Self-pay | Admitting: Physical Therapy

## 2023-12-24 ENCOUNTER — Encounter: Payer: Self-pay | Admitting: Family Medicine

## 2023-12-24 ENCOUNTER — Ambulatory Visit (HOSPITAL_BASED_OUTPATIENT_CLINIC_OR_DEPARTMENT_OTHER): Attending: Family Medicine | Admitting: Physical Therapy

## 2023-12-24 ENCOUNTER — Ambulatory Visit (INDEPENDENT_AMBULATORY_CARE_PROVIDER_SITE_OTHER): Admitting: Family Medicine

## 2023-12-24 VITALS — BP 131/71 | HR 57 | Ht 66.0 in | Wt 118.4 lb

## 2023-12-24 DIAGNOSIS — J449 Chronic obstructive pulmonary disease, unspecified: Secondary | ICD-10-CM | POA: Diagnosis not present

## 2023-12-24 DIAGNOSIS — E785 Hyperlipidemia, unspecified: Secondary | ICD-10-CM

## 2023-12-24 DIAGNOSIS — M25562 Pain in left knee: Secondary | ICD-10-CM | POA: Insufficient documentation

## 2023-12-24 DIAGNOSIS — S82142D Displaced bicondylar fracture of left tibia, subsequent encounter for closed fracture with routine healing: Secondary | ICD-10-CM

## 2023-12-24 DIAGNOSIS — N1832 Chronic kidney disease, stage 3b: Secondary | ICD-10-CM | POA: Diagnosis not present

## 2023-12-24 DIAGNOSIS — R262 Difficulty in walking, not elsewhere classified: Secondary | ICD-10-CM | POA: Diagnosis not present

## 2023-12-24 DIAGNOSIS — M6281 Muscle weakness (generalized): Secondary | ICD-10-CM | POA: Diagnosis not present

## 2023-12-24 DIAGNOSIS — M1A39X0 Chronic gout due to renal impairment, multiple sites, without tophus (tophi): Secondary | ICD-10-CM | POA: Diagnosis not present

## 2023-12-24 DIAGNOSIS — S82122A Displaced fracture of lateral condyle of left tibia, initial encounter for closed fracture: Secondary | ICD-10-CM | POA: Diagnosis not present

## 2023-12-24 DIAGNOSIS — K4021 Bilateral inguinal hernia, without obstruction or gangrene, recurrent: Secondary | ICD-10-CM | POA: Diagnosis not present

## 2023-12-24 DIAGNOSIS — M5459 Other low back pain: Secondary | ICD-10-CM | POA: Insufficient documentation

## 2023-12-24 DIAGNOSIS — K219 Gastro-esophageal reflux disease without esophagitis: Secondary | ICD-10-CM

## 2023-12-24 MED ORDER — ALLOPURINOL 100 MG PO TABS: 100.0000 mg | ORAL_TABLET | Freq: Every day | ORAL | 3 refills | Status: AC

## 2023-12-24 MED ORDER — POTASSIUM CHLORIDE CRYS ER 10 MEQ PO TBCR: 10.0000 meq | EXTENDED_RELEASE_TABLET | Freq: Every day | ORAL | 3 refills | Status: AC

## 2023-12-24 MED ORDER — TRELEGY ELLIPTA 100-62.5-25 MCG/ACT IN AEPB: 1.0000 | INHALATION_SPRAY | Freq: Every day | RESPIRATORY_TRACT | 3 refills | Status: AC

## 2023-12-24 MED ORDER — OMEPRAZOLE 20 MG PO CPDR: 20.0000 mg | DELAYED_RELEASE_CAPSULE | Freq: Every day | ORAL | 3 refills | Status: AC

## 2023-12-24 MED ORDER — ROSUVASTATIN CALCIUM 40 MG PO TABS: 40.0000 mg | ORAL_TABLET | Freq: Every day | ORAL | 3 refills | Status: AC

## 2023-12-24 MED ORDER — EZETIMIBE 10 MG PO TABS: 10.0000 mg | ORAL_TABLET | Freq: Every day | ORAL | 3 refills | Status: AC

## 2023-12-24 MED ORDER — METOPROLOL SUCCINATE ER 100 MG PO TB24: 100.0000 mg | ORAL_TABLET | Freq: Every day | ORAL | 3 refills | Status: AC

## 2023-12-24 MED ORDER — ZOLPIDEM TARTRATE ER 6.25 MG PO TBCR: 6.2500 mg | EXTENDED_RELEASE_TABLET | Freq: Every evening | ORAL | 3 refills | Status: AC | PRN

## 2023-12-24 MED ORDER — EMPAGLIFLOZIN 10 MG PO TABS: 10.0000 mg | ORAL_TABLET | Freq: Every day | ORAL | 3 refills | Status: AC

## 2023-12-24 NOTE — Therapy (Signed)
 OUTPATIENT PHYSICAL THERAPY LOWER EXTREMITY EVALUATION   Patient Name: Andrew Townsend MRN: [id number] DOB:27-Dec-1950, 73 y.o., male Today's Date: 12/24/2023  END OF SESSION:  PT End of Session - 12/24/23 1558     Visit Number 5    Number of Visits 18    Date for PT Re-Evaluation 01/27/24    Authorization Type [employer] MCR    PT Start Time 1315    PT Stop Time 1400    PT Time Calculation (min) 45 min    Activity Tolerance Patient tolerated treatment well;Patient limited by pain    Behavior During Therapy WFL for tasks assessed/performed              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)    Medtronic   Myocardial infarction (HCC)    Stroke (HCC)    Past Surgical History:  Procedure Laterality Date   arm surgery Right    broken - pins placed but now removed   COLONOSCOPY     CORONARY ARTERY BYPASS GRAFT     in [place]   EYE SURGERY Bilateral    remove cataracts   HERNIA REPAIR     ICD IMPLANT     Medtronic   INGUINAL HERNIA REPAIR Bilateral 08/19/2023   Procedure: OPEN BILATERAL INGUINAL HERNIA REPAIR WITH MESH;  Surgeon: Rubin Calamity, MD;  Location: [facility] OR;  Service: General;  Laterality: Bilateral;   JOINT REPLACEMENT Right    shoulder in [place]   MANDIBLE SURGERY     lower jaw surgery for placement of dentures per patient   MULTIPLE TOOTH EXTRACTIONS     all teeth pulled - dentures    TONSILLECTOMY     UPPER GI ENDOSCOPY     WISDOM TOOTH EXTRACTION     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) 12/21/2021   Piriformis syndrome of both sides 12/21/2021   Hx of shoulder surgery (left rotator cuff surgery, right rotator cuff then reverse total shoulder arthoplasty) 12/07/2021   Lumbar facet arthropathy 12/07/2021   Bilateral primary osteoarthritis of knee 12/07/2021   Sacroiliac joint pain 12/07/2021   Gastroesophageal reflux disease 10/27/2021   S/P CABG (coronary artery bypass graft) 05/26/2021   ICD (implantable cardioverter-defibrillator) in place 05/26/2021   COPD (chronic obstructive pulmonary disease) (HCC) 05/26/2021   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   Insomnia 05/26/2021   Atherosclerotic heart disease of native coronary artery with unspecified angina pectoris (HCC) 05/26/2021   BPH (benign  prostatic hyperplasia) 03/25/2021   Stage 3b chronic kidney disease (HCC) 06/16/2018   Hypertension 04/03/2018   Hyperlipidemia 04/03/2018   Chronic pain 04/03/2018   Chronic systolic congestive heart failure (HCC) 04/03/2018   Gout 04/03/2018   Renal artery stenosis (HCC) 04/03/2018   Thrombocytopenia (HCC) 04/03/2018   Anemia in chronic kidney disease 04/03/2018   Ischemic dilated cardiomyopathy (HCC) 06/29/2016   VT (ventricular tachycardia) (HCC) 06/28/2016    PCP:  Chandra Toribio POUR, MD     REFERRING PROVIDER:  Chandra Toribio POUR, MD  12/24/23 LLE : Dr Elspeth Parker   REFERRING DIAG:  M54.16 (ICD-10-CM) - Lumbar  radiculopathy  8/5/25Closed fx of left tibeal plateau    Rationale for Evaluation and Treatment: Rehabilitation  THERAPY DIAG:  Acute pain of left knee  Muscle weakness (generalized)  Other low back pain  Difficulty in walking, not elsewhere classified  ONSET DATE: >10 yrs  SUBJECTIVE:                                                                                                                                                                                           SUBJECTIVE STATEMENT: Andrew Townsend off 5th rung of an eight foot A-frame ladder. Pain in left knee as well as ankle with difficulty wiggling toes. Foot numbness present with radiating pain up the leg.  LBP getting worse with use of fww.    Eval: Pt reports LBP and R buttocks pain. Injections into the back and piriformis are hit or miss in terms of relief. They are too expensive. Pt does have a [employer] membership. Pt has been dealing with this condition for quite some time with multiple episodes of PT and chiro. Pain will down into the posterior and will be just above the knee and will start having R calf cramps. Pt is unable to walk a mile like he used to , he is only down to .25 miles now with shooting. Pt is limited to 15 mins of sitting before pain starts. Denies cancer red flags.   PERTINENT HISTORY:  CHF, CAD, COPD, HTN, CKD, recent hernia repair, R TSA,   PAIN:  Are you having pain? Yes: NPRS scale: 7/10 consistently Pain location: R QL, middle lower back, sacral/glute on R> L Pain description: sharp Aggravating factors: extension, sitting for too long,   Relieving factors: chiro, resting, meds, movement, lumbar support   12/24/23 Are you having pain? Yes: NPRS scale: current 2/10; worst 10/10,  Pain location: Left knee Pain description: achy Aggravating factors: movement and weight bearing Relieving factors: resting , meds, ice   Yes: NPRS scale: current 4/10; worst: 5/10 Pain location: Left  Ankle/foot Pain  description: numbness, tingling and pain with raising pain rising toward knee Aggravating factors: movement and weight bearing Relieving factors: rest, meds, ice PRECAUTIONS: ICD/Pacemaker last shocked registered on Oct 14, 2023  RED FLAGS: None   WEIGHT BEARING RESTRICTIONS: No WBAT 12/16/23  FALLS:  Has patient fallen in last 6 months? Yes. Number of falls 3, Off ladder; off bed; in parking garage  LIVING ENVIRONMENT: Lives with: lives with their family, sister Lives in: House/apartment Stairs: to enter Has following equipment at home: has walking stick and walker available at home, does not use   OCCUPATION: retired   PLOF: Independent with basic ADLs  PATIENT GOALS: relieve back pain; wants to hike, wants to build out his mini camper to travel   12/24/23: heal to finish his mini camper and travel  OBJECTIVE:  Note: Objective measures were completed at Evaluation unless otherwise noted.  DIAGNOSTIC FINDINGS:  CT left knee from 11/09/2023 Lateral tibial plateau fracture with evidence of some impaction but no significantly displaced fragments.  Chondrocalcinosis noted within the medial and lateral joint spaces.    Moderate atherosclerotic calcifications within the abdominal aorta. A vascular stent overlies the L1-2 disc level.   IMPRESSION: 1. Moderate to severe levocurvature centered at L2. 2. Severe multilevel degenerative disc and endplate changes. 3. Mild retrolisthesis of L1 on L2.  PATIENT SURVEYS:  ODI: ODI Score = 21 points (42%)  Interpretation: 41% to 60% (severe disability): Pain is a primary problem for these patients, but they may also be experiencing significant problems in travel, personal care, social life, sexual activity and sleep. A detailed evaluation is appropriate.  12/24/23 LEFS: 30/80  COGNITION:   Overall cognitive status: WFL              SENSATION: NT into R thigh, neuropathy into distal LE      L knee in knee immobilizer  12/24/23:  numbness and tingling left ankle and foot   LOWER EXTREMITY ROM:   Active ROM Right eval Left eval  Hip flexion      Hip extension      Hip abduction      Hip adduction      Hip internal rotation      Hip external rotation      Knee flexion   100  Knee extension   -15  Ankle dorsiflexion   Limited by edema   Ankle plantarflexion   Limited by edema  Ankle inversion     Ankle eversion       (Blank rows = not tested)     FUNCTIONAL TESTS: STS requires UE assist; L knee locked in knee immobilizer and NWB throughout transfers       GAIT: Bilat axillary crutches and RW with SBA and gait belt  Stairs: step to pattern with single crutch and R sided hand rail   Observation: scoliotic curves noted in lumbar and thoracic spine   12/24/23: Amb with RW (adjusted to height) x400 ft.  Cadence slowed gait antalgic. Using knee immobilizer  TODAY'S TREATMENT                                                                          DATE:  12/24/23 Tibial plateau fx left assessment Exercises - Seated Quad Set  - 3-5 x daily - 7 x weekly - 1 sets - 5-10 reps - Long Sitting Quad Set  - 3-5 x daily - 7 x weekly - 1 sets - 5-10 reps - Supine Hip Abduction  - 3-5 x daily - 7 x weekly - 3-5 sets - 5-10 reps - Straight Leg Raise     6/26  Gait training: AD sizing, maintaining WB precautions, stair sequencing and safety; use of RW for level surfaces inside home and crutches for stair navigation   6/16  UPA L2-5 bilat grade III STM bilat paraspinal and QL R LE LAD with lumbar bias 5 min LAD with PPT   6/12 UPA L2-5 bilat grade III STM bilat paraspinal and QL R LE LAD with lumbar bias 5 min LAD with PPT   LTR 20x R lateral shift correction 2x10 at wall Lumbar flexion seated with L bias 2x10 Postural changes during the  day, neck/shoulder/hip positioning  10/29/23  Exercises - Seated Sciatic Nerve Glide  - 2 x daily - 7 x weekly - 2 sets - 10 reps - 3 hold - Seated Quadratus Lumborum Stretch in Chair  - 2 x daily - 7 x weekly - 1 sets - 3 reps - 30 hold - Sit to Stand with Resistance Around Legs  - 1 x daily - 7 x weekly - 2 sets - 10 reps   PATIENT EDUCATION:    Education details: MOI, diagnosis, prognosis, anatomy, exercise progression, DOMS expectations, muscle firing,  envelope of function, HEP, POC   Person educated: Patient Education method: Explanation, Demonstration, Verbal cues, and Handouts Education comprehension: verbalized understanding, returned demonstration, and verbal cues required   HOME EXERCISE PROGRAM:    Access Code: [email or web address] URL: [email or web address] Date: 10/29/2023 Prepared by: Dale Call  Aquatic TBA   Access Code: [facility] URL: [email or web address] Date: 12/24/2023 Prepared by: Matilda Kohut   Exercises - Seated Quad Set  - 3-5 x daily - 7 x weekly - 1 sets - 5-10 reps - Long Sitting Quad Set  - 3-5 x daily - 7 x weekly - 1 sets - 5-10 reps - Supine Hip Abduction  - 3-5 x daily - 7 x weekly - 3-5 sets - 5-10 reps - Straight Leg Raise  - 3-5 x daily - 7 x weekly - 3-5 sets - 5-10 reps     ASSESSMENT:   CLINICAL IMPRESSION:  Patient is a 73 y.o. m who was seen today for physical therapy evaluation and treatment for Closed fracture of lateral portion of left tibial plateau. He is WBAT as per Md as fx is stable. He has been being seen also for LBP which is now on hold due to new fx.  He presents today for aquatic therapy intervention.  He is amb with fww.  Fww height adjusted as he maneuvers 400 ft ot pool setting without difficulty.  His fx is stabile not requiring surgical intervention. Has knee immobilizer that he is wearing when out of home.  He did drive himself here today. He is now WBAT.  Reports climbing multiple steps daily to bed  and bath.  Pt edu on proper technique for stair climbing using crutches. Objective testing demonstrates left knee ROM limited in both flex and extension   Pt presents to session with bilat axillary crutches and L knee immobilizer from recent tibial plateau fracture from falling off of ladder. Pt with well managed pain. Session used to give gait training and safety edu with new NWB status. Pt advised on edema management, self care at home with bathing/transfers, and safety at home with stair management and AD usage. Pt to place PT on hold. Progress note done today to add L knee to current plan of care. Pt would benefit from continued skilled therapy in order to reach goals and maximize functional lumbopelvic strength and ROM for return to  normalized ADL and household activities.      OBJECTIVE IMPAIRMENTS: Abnormal gait, decreased activity tolerance, decreased balance, decreased coordination, decreased endurance, decreased mobility, difficulty walking, decreased ROM, decreased strength, hypomobility, increased edema, increased fascial restrictions, impaired perceived functional ability, impaired flexibility, improper body mechanics, and pain.    ACTIVITY LIMITATIONS: carrying, lifting, bending, sitting, standing, squatting, stairs, transfers, and locomotion level   PARTICIPATION LIMITATIONS: meal prep, cleaning, laundry, interpersonal relationship, driving, shopping, community activity, and yard work   PERSONAL FACTORS: PMH - chf, macular degeneration, DM, cervical CA are also affecting patient's functional outcome.    REHAB POTENTIAL: fair   CLINICAL DECISION MAKING: complicated/unstable   EVALUATION COMPLEXITY: moderate     GOALS:   SHORT TERM GOALS: Target date: 12/10/2023     Pt will become independent with HEP in order to demonstrate synthesis of PT education.     Goal status: INITIAL    2. Pt will demonstrate at least a 12.8 improvement in Oswestry Index in order to demonstrate a  clinically significant change in LBP and function.    Goal status: INITIAL     LONG TERM GOALS: Target date: 01/27/2024        Pt  will become independent with final HEP in order to demonstrate synthesis of PT education.    Goal status: INITIAL   2.  Pt will demonstrate at least a 25pt improvement in Oswestry Index in order to demonstrate a clinically significant change in LBP and function. Goal status: INITIAL   3.  Pt will be able to demonstrate/report ability to sit/stand/sleep for extended periods of time without pain in order to demonstrate functional improvement and tolerance to static positioning.    Goal status: INITIAL   4. Pt will be able to demonstrate/report ability to walk >25 mins without pain in order to demonstrate functional improvement and tolerance to exercise and community mobility.     Goal status: INITIAL     5. Pt to improve on LEFS by at least 9 point  (MCID)to demonstrate statistically significant Improvement in function.   Baseline: 30/80   Goals Status: New   6. Pt will improve left knee extension to 0d and flex to at least 120d   Baseline: See chart   Goal Status: New   7. Pt will amb using SPC community distances without limitation of knee pain   Baseline: Using fww x 400 ft   Goal status: New  8. Pt will report decrease in left knee pain by at least 75% for improved toleration to functional mobility/amb  Baseline:  Goal status: New  PLAN:   PLAN:   PT  FREQUENCY: 1-2x/week   PT DURATION: 12 weeks    PLANNED INTERVENTIONS: Therapeutic exercises, Therapeutic activity, Neuromuscular re-education, Balance training, Gait training, Patient/Family education, Joint manipulation, Joint mobilization, Stair training, Orthotic/Fit training, DME instructions, Aquatic Therapy, Dry Needling, Electrical stimulation, Spinal manipulation, Spinal mobilization, Cryotherapy, Moist heat, scar mobilization, Splintting, Taping, Vasopneumatic device, Traction,  Ultrasound, Ionotophoresis 4mg /ml Dexamethasone , Manual therapy, and Re-evaluation.   PLAN FOR NEXT SESSION: mobilizations; weight room progression, lumbar mobility 12/24/23: aquatic: gait, balance, core and LE strengthening; stretching, pain management   Andrew Townsend) Andrew Townsend MPT 12/24/23 4:05 PM [facility] [address] [place], [place], [address] Phone: [phone]   Fax:  [phone]

## 2023-12-24 NOTE — Patient Instructions (Signed)
 It was nice to see you today,  We addressed the following topics today: -I have sent in your chronic medications prescribed by us  to [facility] as 90-day supplies with 3 refills. -I would talk to your surgeon about whether or not a repeat surgery is necessary.  If you want something like a hernia belt and need a prescription for it let me know and I will send it in.  You can pick these up at medical supply stores.  Have a great day,  Rolan Slain, MD

## 2023-12-24 NOTE — Assessment & Plan Note (Signed)
 Presents with a prominent lump in the right groin and a smaller one on the left after a double inguinal hernia repair on 08/19/2023. This was preceded by a fall in June 2025. Reports intermittent sharp pains. Examination reveals a large, reducible right inguinal hernia. - Continue to monitor. - Advised to seek emergency care for signs of strangulation, including severe pain (10/10), discoloration (purple/red/blue), or if the hernia becomes non-reducible. - Discussed use of an over-the-counter hernia belt for comfort. - Follow up with surgeon on 01/02/2024. Will defer to surgeon's recommendation regarding need for repeat surgery.

## 2023-12-24 NOTE — Assessment & Plan Note (Signed)
 Ongoing left knee swelling and pain with limited extension. Ongoing left ankle pain. Using a walker for ambulation. - Proceeding with evaluation for aquatic therapy today. - Continue tapering pain medication as tolerated.

## 2023-12-24 NOTE — Progress Notes (Unsigned)
 Established Patient Office Visit  Subjective   Patient ID: Andrew Townsend, male    DOB: April 05, 1951  Age: 73 y.o. MRN: [id number]  Chief Complaint  Patient presents with   Medical Management of Chronic Issues    HPI  Subjective - Follow-up post-fall. Still using a walker. Uses crutches at home but cannot ambulate long distances. Has an appointment for aquatic therapy evaluation today. - Reports persistent left knee swelling and pain. Unable to fully extend the knee due to sharp pain. - Left ankle pain when weight-bearing in certain positions. - Reports overall pain is mostly resolved.  - Follow-up on bilateral inguinal hernia repair from 08/19/2023. Reports a large lump on the right side, larger than pre-operatively, and a smaller lump on the left. Experiencing intermittent sharp pains on both sides. The lump is more pronounced when standing and reduces when supine. Denies constant severe pain, discoloration, or irreducibility. Reports occasional gurgling sounds in the area of the hernia. Has an appointment with the surgeon on 01/02/2024.  - Chronic pain management is unchanged.  Medications Allopurinol , Zetia  (ezetimibe ), metoprolol , omeprazole , potassium, Crestor  (rosuvastatin ), Trelegy, and Ambien  (zolpidem ) prescriptions will be sent for a 90-day supply with refills to [facility] pharmacy. Continues to take Eliquis  and Jardiance , which are prescribed by another provider. Takes Percocet for acute pain as needed, but has not required it recently. Will taper Percocet prescription if future refills are needed.  PMH, PSH, FH, Social Hx PSH: Double inguinal hernia repair with mesh on 08/19/2023. Previous hernia repair >15 years ago. Fall in June 2025. Social Hx: Former Special educational needs teacher for 27 years. Plans to travel in a minivan.  ROS Constitutional: Denies fever. GU: Reports lump in the right groin, smaller lump in the left groin. Reports intermittent sharp pains in the groin. MSK: Reports  left knee pain and swelling, left ankle pain.    The ASCVD Risk score (Arnett DK, et al., 2019) failed to calculate for the following reasons:   Risk score cannot be calculated because patient has a medical history suggesting prior/existing ASCVD  Health Maintenance Due  Topic Date Due   Hepatitis C Screening  Never done   COVID-19 Vaccine (6 - 2024-25 season) 03/01/2023   INFLUENZA VACCINE  12/20/2023      Objective:     BP 131/71   Pulse (!) 57   Ht 5' 6 (1.676 m)   Wt 118 lb 6.4 oz (53.7 kg)   SpO2 98%   BMI 19.11 kg/m  {Vitals History (Optional):23777}  Physical Exam Gen: alert, oriented Msk: left knee in brace.  Minimal left ankle swelling.  Ambulating with walker   No results found for any visits on 12/24/23.      Assessment & Plan:   Closed fracture of left tibial plateau with routine healing, subsequent encounter Assessment & Plan: Ongoing left knee swelling and pain with limited extension. Ongoing left ankle pain. Using a walker for ambulation. - Proceeding with evaluation for aquatic therapy today. - Continue tapering pain medication as tolerated.   Chronic gout due to renal impairment of multiple sites without tophus -     Allopurinol ; Take 1 tablet (100 mg total) by mouth daily.  Dispense: 90 tablet; Refill: 3  Hyperlipidemia, unspecified hyperlipidemia type -     Ezetimibe ; Take 1 tablet (10 mg total) by mouth daily.  Dispense: 90 tablet; Refill: 3 -     Rosuvastatin  Calcium ; Take 1 tablet (40 mg total) by mouth at bedtime.  Dispense: 90 tablet; Refill: 3  Stage 3b chronic kidney disease (HCC) -     Metoprolol  Succinate ER; Take 1 tablet (100 mg total) by mouth daily. PLEASE NOTE DECREASED DOSE.  Dispense: 90 tablet; Refill: 3  Gastroesophageal reflux disease, unspecified whether esophagitis present -     Omeprazole ; Take 1 capsule (20 mg total) by mouth daily.  Dispense: 90 capsule; Refill: 3  Chronic obstructive pulmonary disease, unspecified  COPD type (HCC) -     Trelegy Ellipta ; Inhale 1 puff into the lungs daily.  Dispense: 180 each; Refill: 3  Bilateral recurrent inguinal hernia without obstruction or gangrene Assessment & Plan: Presents with a prominent lump in the right groin and a smaller one on the left after a double inguinal hernia repair on 08/19/2023. This was preceded by a fall in June 2025. Reports intermittent sharp pains. Examination reveals a large, reducible right inguinal hernia. - Continue to monitor. - Advised to seek emergency care for signs of strangulation, including severe pain (10/10), discoloration (purple/red/blue), or if the hernia becomes non-reducible. - Discussed use of an over-the-counter hernia belt for comfort. - Follow up with surgeon on 01/02/2024. Will defer to surgeon's recommendation regarding need for repeat surgery.   Other orders -     Empagliflozin ; Take 1 tablet (10 mg total) by mouth daily before breakfast.  Dispense: 90 tablet; Refill: 3 -     Potassium Chloride  Crys ER; Take 1 tablet (10 mEq total) by mouth daily.  Dispense: 90 tablet; Refill: 3 -     Zolpidem  Tartrate ER; Take 1 tablet (6.25 mg total) by mouth at bedtime as needed for sleep.  Dispense: 90 tablet; Refill: 3     Return in about 4 months (around 04/24/2024) for knee, hernia, chronic issues.    Toribio MARLA Slain, MD

## 2023-12-25 DIAGNOSIS — E785 Hyperlipidemia, unspecified: Secondary | ICD-10-CM | POA: Diagnosis not present

## 2023-12-25 DIAGNOSIS — Z95810 Presence of automatic (implantable) cardiac defibrillator: Secondary | ICD-10-CM | POA: Diagnosis not present

## 2023-12-25 DIAGNOSIS — N400 Enlarged prostate without lower urinary tract symptoms: Secondary | ICD-10-CM | POA: Diagnosis not present

## 2023-12-25 DIAGNOSIS — I639 Cerebral infarction, unspecified: Secondary | ICD-10-CM | POA: Diagnosis not present

## 2023-12-25 DIAGNOSIS — N200 Calculus of kidney: Secondary | ICD-10-CM | POA: Diagnosis not present

## 2023-12-25 DIAGNOSIS — Z9889 Other specified postprocedural states: Secondary | ICD-10-CM | POA: Diagnosis not present

## 2023-12-25 DIAGNOSIS — I25119 Atherosclerotic heart disease of native coronary artery with unspecified angina pectoris: Secondary | ICD-10-CM | POA: Diagnosis not present

## 2023-12-25 DIAGNOSIS — M1000 Idiopathic gout, unspecified site: Secondary | ICD-10-CM | POA: Diagnosis not present

## 2023-12-25 DIAGNOSIS — I10 Essential (primary) hypertension: Secondary | ICD-10-CM | POA: Diagnosis not present

## 2023-12-27 ENCOUNTER — Ambulatory Visit (HOSPITAL_BASED_OUTPATIENT_CLINIC_OR_DEPARTMENT_OTHER): Admitting: Physical Therapy

## 2023-12-27 ENCOUNTER — Encounter (HOSPITAL_BASED_OUTPATIENT_CLINIC_OR_DEPARTMENT_OTHER): Payer: Self-pay | Admitting: Physical Therapy

## 2023-12-27 DIAGNOSIS — M5459 Other low back pain: Secondary | ICD-10-CM

## 2023-12-27 DIAGNOSIS — R262 Difficulty in walking, not elsewhere classified: Secondary | ICD-10-CM | POA: Diagnosis not present

## 2023-12-27 DIAGNOSIS — M25562 Pain in left knee: Secondary | ICD-10-CM | POA: Diagnosis not present

## 2023-12-27 DIAGNOSIS — M6281 Muscle weakness (generalized): Secondary | ICD-10-CM

## 2023-12-27 DIAGNOSIS — S82122A Displaced fracture of lateral condyle of left tibia, initial encounter for closed fracture: Secondary | ICD-10-CM | POA: Diagnosis not present

## 2023-12-27 NOTE — Therapy (Signed)
 OUTPATIENT PHYSICAL THERAPY LOWER EXTREMITY EVALUATION   Patient Name: Andrew Townsend MRN: [id number] DOB:1950-08-07, 73 y.o., male Today's Date: 12/27/2023  END OF SESSION:  PT End of Session - 12/27/23 0831     Visit Number 6    Number of Visits 18    Date for PT Re-Evaluation 01/27/24    Authorization Type [employer] MCR    Authorization Time Period 8/5 - 10/3    Authorization - Number of Visits 20    Progress Note Due on Visit 10    PT Start Time 0805    PT Stop Time 0843    PT Time Calculation (min) 38 min    Activity Tolerance Patient tolerated treatment well;Patient limited by pain    Behavior During Therapy WFL for tasks assessed/performed              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)    Medtronic   Myocardial infarction (HCC)    Stroke (HCC)    Past Surgical History:  Procedure Laterality Date   arm surgery Right    broken - pins placed but now removed   COLONOSCOPY     CORONARY ARTERY BYPASS GRAFT     in [place]   EYE SURGERY Bilateral    remove cataracts   HERNIA REPAIR     ICD IMPLANT     Medtronic   INGUINAL HERNIA REPAIR Bilateral 08/19/2023   Procedure: OPEN BILATERAL INGUINAL HERNIA REPAIR WITH MESH;  Surgeon: Rubin Calamity, MD;  Location: [facility] OR;  Service: General;  Laterality: Bilateral;   JOINT REPLACEMENT Right    shoulder in [place]   MANDIBLE SURGERY      lower jaw surgery for placement of dentures per patient   MULTIPLE TOOTH EXTRACTIONS     all teeth pulled - dentures   TONSILLECTOMY     UPPER GI ENDOSCOPY     WISDOM TOOTH EXTRACTION     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) 12/21/2021   Piriformis syndrome of both sides 12/21/2021   Hx of shoulder surgery (left rotator cuff surgery, right rotator cuff then reverse total shoulder arthoplasty) 12/07/2021   Lumbar facet arthropathy 12/07/2021   Bilateral primary osteoarthritis of knee 12/07/2021   Sacroiliac joint pain 12/07/2021   Gastroesophageal reflux disease 10/27/2021   S/P CABG (coronary artery bypass graft) 05/26/2021   ICD (implantable cardioverter-defibrillator) in place 05/26/2021   COPD (chronic obstructive pulmonary disease) (HCC) 05/26/2021  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   Insomnia 05/26/2021   Atherosclerotic heart disease of native coronary artery with unspecified angina pectoris (HCC) 05/26/2021   BPH (benign prostatic hyperplasia) 03/25/2021   Stage 3b chronic kidney disease (HCC) 06/16/2018   Hypertension 04/03/2018   Hyperlipidemia 04/03/2018   Chronic pain 04/03/2018   Chronic systolic congestive heart failure (HCC) 04/03/2018   Gout 04/03/2018   Renal artery stenosis (HCC) 04/03/2018   Thrombocytopenia (HCC) 04/03/2018   Anemia in chronic kidney disease 04/03/2018   Ischemic dilated cardiomyopathy (HCC) 06/29/2016   VT (ventricular tachycardia) (HCC) 06/28/2016    PCP:  Chandra Toribio POUR, MD      REFERRING PROVIDER:  Chandra Toribio POUR, MD  12/24/23 LLE : Dr Elspeth Parker   REFERRING DIAG:  M54.16 (ICD-10-CM) - Lumbar radiculopathy  8/5/25Closed fx of left tibeal plateau    Rationale for Evaluation and Treatment: Rehabilitation  THERAPY DIAG:  Acute pain of left knee  Muscle weakness (generalized)  Other low back pain  ONSET DATE: >10 yrs  SUBJECTIVE:                                                                                                                                                                                           SUBJECTIVE STATEMENT: LB 7/10; left knee 7/10.  I am so gun shy that it is going to hurt.    Eval: Pt reports LBP and R buttocks pain. Injections into the back and piriformis are hit or miss in terms of relief. They are too expensive. Pt does have a [employer] membership. Pt has been dealing with this condition for quite some time with multiple episodes of PT and chiro. Pain will down into the posterior and will be just above the knee and will start having R calf cramps. Pt is unable to walk a mile like he used to , he is only down to .25 miles now with shooting. Pt is limited to 15 mins of sitting before pain starts. Denies cancer red flags.   PERTINENT HISTORY:  CHF, CAD, COPD, HTN, CKD, recent hernia repair, R TSA,   PAIN:  Are you having pain? Yes: NPRS scale: 7/10 consistently Pain location: R QL, middle lower back, sacral/glute on R> L Pain description: sharp Aggravating factors: extension, sitting for too long,   Relieving factors: chiro, resting, meds, movement, lumbar support   12/24/23 Are you having pain? Yes: NPRS scale: current 2/10; worst 10/10,  Pain location: Left knee Pain description: achy Aggravating factors: movement and weight bearing Relieving factors: resting , meds, ice   Yes: NPRS scale: current 4/10; worst: 5/10 Pain location: Left Ankle/foot Pain description:  numbness, tingling and pain with raising pain  rising toward knee Aggravating factors: movement and weight bearing Relieving factors: rest, meds, ice PRECAUTIONS: ICD/Pacemaker last shocked registered on Oct 14, 2023  RED FLAGS: None   WEIGHT BEARING RESTRICTIONS: No WBAT 12/16/23  FALLS:  Has patient fallen in last 6 months? Yes. Number of falls 3, Off ladder; off bed; in parking garage  LIVING ENVIRONMENT: Lives with: lives with their family, sister Lives in: House/apartment Stairs: to enter Has following equipment at home: has walking stick and walker available at home, does not use   OCCUPATION: retired   PLOF: Independent with basic ADLs  PATIENT GOALS: relieve back pain; wants to hike, wants to build out his mini camper to travel   12/24/23: heal to finish his mini camper and travel  OBJECTIVE:  Note: Objective measures were completed at Evaluation unless otherwise noted.  DIAGNOSTIC FINDINGS:  CT left knee from 11/09/2023 Lateral tibial plateau fracture with evidence of some impaction but no significantly displaced fragments.  Chondrocalcinosis noted within the medial and lateral joint spaces.    Moderate atherosclerotic calcifications within the abdominal aorta. A vascular stent overlies the L1-2 disc level.   IMPRESSION: 1. Moderate to severe levocurvature centered at L2. 2. Severe multilevel degenerative disc and endplate changes. 3. Mild retrolisthesis of L1 on L2.  PATIENT SURVEYS:  ODI: ODI Score = 21 points (42%)  Interpretation: 41% to 60% (severe disability): Pain is a primary problem for these patients, but they may also be experiencing significant problems in travel, personal care, social life, sexual activity and sleep. A detailed evaluation is appropriate.  12/24/23 LEFS: 30/80  COGNITION:   Overall cognitive status: WFL              SENSATION: NT into R thigh, neuropathy into distal LE      L knee in knee immobilizer  12/24/23: numbness and tingling left ankle and foot   LOWER EXTREMITY  ROM:   Active ROM Right eval Left eval  Hip flexion      Hip extension      Hip abduction      Hip adduction      Hip internal rotation      Hip external rotation      Knee flexion   100  Knee extension   -15  Ankle dorsiflexion   Limited by edema   Ankle plantarflexion   Limited by edema  Ankle inversion     Ankle eversion       (Blank rows = not tested)     FUNCTIONAL TESTS: STS requires UE assist; L knee locked in knee immobilizer and NWB throughout transfers       GAIT: Bilat axillary crutches and RW with SBA and gait belt  Stairs: step to pattern with single crutch and R sided hand rail   Observation: scoliotic curves noted in lumbar and thoracic spine   12/24/23: Amb with RW (adjusted to height) x400 ft.  Cadence slowed gait antalgic. Using knee immobilizer  TODAY'S TREATMENT                                                                            [facility] Adult PT Treatment:                                                DATE: 12/27/23 Pt seen for aquatic therapy today.  Treatment took place in water 3.5-4.75 ft in depth at the [facility] pool. Temp of water was 91.  Pt entered/exited the pool via stairs with heavy ue support then exits via lift   *Intro to setting *walking forward, back and side stepping in 3.6 -4.4 ft with ue support of barbell multiple widths.  VC for heel and toe strike, knee flex. *Ue support on wall: toe raises; heel raises; HS curls; high knee marching to toleration *seated on lift: cycling; hip add/abd; LAQ *side stepping ue support barbell  - rest periods given throughout.   Pt requires the buoyancy and hydrostatic pressure of water for support, and to offload joints by unweighting joint load by at least 50 % in navel deep water and by at least 75-80% in chest to neck  deep water.  Viscosity of the water is needed for resistance of strengthening. Water current perturbations provides challenge to standing balance requiring increased core activation.     12/24/23 Tibial plateau fx left assessment Exercises - Seated Quad Set  - 3-5 x daily - 7 x weekly - 1 sets - 5-10 reps - Long Sitting Quad Set  - 3-5 x daily - 7 x weekly - 1 sets - 5-10 reps - Supine Hip Abduction  - 3-5 x daily - 7 x weekly - 3-5 sets - 5-10 reps - Straight Leg Raise     6/26  Gait training: AD sizing, maintaining WB precautions, stair sequencing and safety; use of RW for level surfaces inside home and crutches for stair navigation   6/16  UPA L2-5 bilat grade III STM bilat paraspinal and QL R LE LAD with lumbar bias 5 min LAD with PPT   6/12 UPA L2-5 bilat grade III STM bilat paraspinal and QL R LE LAD with lumbar bias 5 min LAD with PPT   LTR 20x R lateral shift correction 2x10 at wall Lumbar flexion seated with L bias 2x10 Postural changes during the day, neck/shoulder/hip positioning  10/29/23  Exercises - Seated Sciatic Nerve Glide  - 2 x daily - 7 x weekly - 2 sets - 10 reps - 3 hold - Seated Quadratus Lumborum Stretch in Chair  - 2 x daily - 7 x weekly - 1 sets - 3 reps - 30 hold - Sit to Stand with Resistance Around Legs  - 1 x daily - 7 x weekly - 2 sets - 10 reps   PATIENT EDUCATION:    Education details: MOI, diagnosis, prognosis, anatomy, exercise progression, DOMS expectations, muscle firing,  envelope of function, HEP, POC   Person educated: Patient Education method: Explanation, Demonstration, Verbal cues, and Handouts Education comprehension: verbalized understanding, returned demonstration, and verbal cues required  HOME EXERCISE PROGRAM:    Access Code: [email or web address] URL: [email or web address] Date: 10/29/2023 Prepared by: Dale Call  Aquatic TBA   Access Code: [facility] URL: [email or web address] Date:  12/24/2023 Prepared by: Matilda Kohut   Exercises - Seated Quad Set  - 3-5 x daily - 7 x weekly - 1 sets - 5-10 reps - Long Sitting Quad Set  - 3-5 x daily - 7 x weekly - 1 sets - 5-10 reps - Supine Hip Abduction  - 3-5 x daily - 7 x weekly - 3-5 sets - 5-10 reps - Straight Leg Raise  - 3-5 x daily - 7 x weekly - 3-5 sets - 5-10 reps     ASSESSMENT:   CLINICAL IMPRESSION:  Pt demonstrates safety and independence in aquatic setting with therapist instructing from deck. He is confident in setting, moving throughout all depths easily but with hesitation bearing weight through lle.  All movements guarded and slowed throughout session.  Pt is directed through various movement patterns and trials in both sitting and standing positions. Multiple rest periods throughout needed. Pain does reduces as session progresses. He is a good candidate for aquatic intervention and will benefit from the properties of water to progress towards functional goals.     Initial impression (tib plat fx) Patient is a 73 y.o. m who was seen today for physical therapy evaluation and treatment for Closed fracture of lateral portion of left tibial plateau. He is WBAT as per Md as fx is stable. He has been being seen also for LBP which is now on hold due to new fx.  He presents today for aquatic therapy intervention.  He is amb with fww.  Fww height adjusted as he maneuvers 400 ft ot pool setting without difficulty.  His fx is stabile not requiring surgical intervention. Has knee immobilizer that he is wearing when out of home. He did drive himself here today. He is now WBAT.  Reports climbing multiple steps daily to bed and bath.  Pt edu on proper technique for stair climbing using crutches. Objective testing demonstrates left knee ROM limited in both flex and extension       OBJECTIVE IMPAIRMENTS: Abnormal gait, decreased activity tolerance, decreased balance, decreased coordination, decreased endurance, decreased mobility,  difficulty walking, decreased ROM, decreased strength, hypomobility, increased edema, increased fascial restrictions, impaired perceived functional ability, impaired flexibility, improper body mechanics, and pain.    ACTIVITY LIMITATIONS: carrying, lifting, bending, sitting, standing, squatting, stairs, transfers, and locomotion level   PARTICIPATION LIMITATIONS: meal prep, cleaning, laundry, interpersonal relationship, driving, shopping, community activity, and yard work   PERSONAL FACTORS: PMH - chf, macular degeneration, DM, cervical CA are also affecting patient's functional outcome.    REHAB POTENTIAL: fair   CLINICAL DECISION MAKING: complicated/unstable   EVALUATION COMPLEXITY: moderate     GOALS:   SHORT TERM GOALS: Target date: 12/10/2023     Pt will become independent with HEP in order to demonstrate synthesis of PT education.     Goal status: INITIAL    2. Pt will demonstrate at least a 12.8 improvement in Oswestry Index in order to demonstrate a clinically significant change in LBP and function.    Goal status: INITIAL     LONG TERM GOALS: Target date: 01/27/2024        Pt  will become independent with final HEP in order to demonstrate synthesis of PT education.    Goal status: INITIAL   2.  Pt will demonstrate at least a 25pt improvement in Oswestry  Index in order to demonstrate a clinically significant change in LBP and function. Goal status: INITIAL   3.  Pt will be able to demonstrate/report ability to sit/stand/sleep for extended periods of time without pain in order to demonstrate functional improvement and tolerance to static positioning.    Goal status: INITIAL   4. Pt will be able to demonstrate/report ability to walk >25 mins without pain in order to demonstrate functional improvement and tolerance to exercise and community mobility.     Goal status: INITIAL     5. Pt to improve on LEFS by at least 9 point  (MCID)to demonstrate statistically  significant Improvement in function.   Baseline: 30/80   Goals Status: New   6. Pt will improve left knee extension to 0d and flex to at least 120d   Baseline: See chart   Goal Status: New   7. Pt will amb using [facility]PC community distances without limitation of knee pain   Baseline: Using fww x 400 ft   Goal status: New  8. Pt will report decrease in left knee pain by at least 75% for improved toleration to functional mobility/amb  Baseline:  Goal status: New  PLAN:   PLAN:   PT FREQUENCY: 1-2x/week   PT DURATION: 12 weeks    PLANNED INTERVENTIONS: Therapeutic exercises, Therapeutic activity, Neuromuscular re-education, Balance training, Gait training, Patient/Family education, Joint manipulation, Joint mobilization, Stair training, , DME instructions, Aquatic Therapy, Dry Needling, Electrical stimulation unattended, Spinal manipulation, Spinal mobilization, Cryotherapy, Moist heat, scar mobilization, Splintting, Taping, Vasopneumatic device, Traction, Ultrasound, Ionotophoresis 4mg /ml Dexamethasone , Manual therapy.   PLAN FOR NEXT SESSION: mobilizations; weight room progression, lumbar mobility 12/24/23: aquatic: gait, balance, core and LE strengthening; stretching, pain management   Ronal Foots) Ahlayah Tarkowski MPT 12/27/23 8:34 AM [facility] [address] [place], [place], [address] Phone: [phone]   Fax:  [phone]

## 2023-12-30 ENCOUNTER — Ambulatory Visit (HOSPITAL_BASED_OUTPATIENT_CLINIC_OR_DEPARTMENT_OTHER): Admitting: Physical Therapy

## 2023-12-30 ENCOUNTER — Encounter: Payer: Self-pay | Admitting: Family Medicine

## 2023-12-30 ENCOUNTER — Encounter (HOSPITAL_BASED_OUTPATIENT_CLINIC_OR_DEPARTMENT_OTHER): Payer: Self-pay | Admitting: Physical Therapy

## 2023-12-30 ENCOUNTER — Other Ambulatory Visit: Payer: Self-pay | Admitting: Family Medicine

## 2023-12-30 DIAGNOSIS — M5459 Other low back pain: Secondary | ICD-10-CM | POA: Diagnosis not present

## 2023-12-30 DIAGNOSIS — R262 Difficulty in walking, not elsewhere classified: Secondary | ICD-10-CM | POA: Diagnosis not present

## 2023-12-30 DIAGNOSIS — M6281 Muscle weakness (generalized): Secondary | ICD-10-CM

## 2023-12-30 DIAGNOSIS — M25562 Pain in left knee: Secondary | ICD-10-CM | POA: Diagnosis not present

## 2023-12-30 DIAGNOSIS — S82122A Displaced fracture of lateral condyle of left tibia, initial encounter for closed fracture: Secondary | ICD-10-CM | POA: Diagnosis not present

## 2023-12-30 MED ORDER — OXYCODONE HCL 5 MG PO TABS: 5.0000 mg | ORAL_TABLET | Freq: Two times a day (BID) | ORAL | 0 refills | Status: AC | PRN

## 2023-12-30 NOTE — Therapy (Signed)
 OUTPATIENT PHYSICAL THERAPY LOWER EXTREMITY TREATMENT   Patient Name: Andrew Townsend MRN: [id number] DOB:11-15-1950, 73 y.o., male Today's Date: 12/30/2023  END OF SESSION:  PT End of Session - 12/30/23 1607     Visit Number 7    Number of Visits 18    Date for PT Re-Evaluation 01/27/24    Authorization Type [employer] MCR    Authorization Time Period 8/5 - 10/3    Authorization - Number of Visits 20    Progress Note Due on Visit 10    PT Start Time 1605    PT Stop Time 1645    PT Time Calculation (min) 40 min    Activity Tolerance Patient tolerated treatment well;Patient limited by pain    Behavior During Therapy WFL for tasks assessed/performed              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)    Medtronic   Myocardial infarction (HCC)    Stroke (HCC)    Past Surgical History:  Procedure Laterality Date   arm surgery Right    broken - pins placed but now removed   COLONOSCOPY     CORONARY ARTERY BYPASS GRAFT     in [place]   EYE SURGERY Bilateral    remove cataracts   HERNIA REPAIR     ICD IMPLANT     Medtronic   INGUINAL HERNIA REPAIR Bilateral 08/19/2023   Procedure: OPEN BILATERAL INGUINAL HERNIA REPAIR WITH MESH;  Surgeon: Rubin Calamity, MD;  Location: [facility] OR;  Service: General;  Laterality: Bilateral;   JOINT REPLACEMENT Right    shoulder in [place]   MANDIBLE SURGERY     lower  jaw surgery for placement of dentures per patient   MULTIPLE TOOTH EXTRACTIONS     all teeth pulled - dentures   TONSILLECTOMY     UPPER GI ENDOSCOPY     WISDOM TOOTH EXTRACTION     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) 12/21/2021   Piriformis syndrome of both sides 12/21/2021   Hx of shoulder surgery (left rotator cuff surgery, right rotator cuff then reverse total shoulder arthoplasty) 12/07/2021   Lumbar facet arthropathy 12/07/2021   Bilateral primary osteoarthritis of knee 12/07/2021   Sacroiliac joint pain 12/07/2021   Gastroesophageal reflux disease 10/27/2021   S/P CABG (coronary artery bypass graft) 05/26/2021   ICD (implantable cardioverter-defibrillator) in place 05/26/2021   COPD (chronic obstructive pulmonary disease) (HCC) 05/26/2021  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   Insomnia 05/26/2021   Atherosclerotic heart disease of native coronary artery with unspecified angina pectoris (HCC) 05/26/2021   BPH (benign prostatic hyperplasia) 03/25/2021   Stage 3b chronic kidney disease (HCC) 06/16/2018   Hypertension 04/03/2018   Hyperlipidemia 04/03/2018   Chronic pain 04/03/2018   Chronic systolic congestive heart failure (HCC) 04/03/2018   Gout 04/03/2018   Renal artery stenosis (HCC) 04/03/2018   Thrombocytopenia (HCC) 04/03/2018   Anemia in chronic kidney disease 04/03/2018   Ischemic dilated cardiomyopathy (HCC) 06/29/2016   VT (ventricular tachycardia) (HCC) 06/28/2016    PCP:  Chandra Toribio POUR, MD     REFERRING  PROVIDER:  Chandra Toribio POUR, MD  12/24/23 LLE : Dr Elspeth Parker   REFERRING DIAG:  M54.16 (ICD-10-CM) - Lumbar radiculopathy  8/5/25Closed fx of left tibeal plateau    Rationale for Evaluation and Treatment: Rehabilitation  THERAPY DIAG:  Acute pain of left knee  Muscle weakness (generalized)  Other low back pain  ONSET DATE: >10 yrs  SUBJECTIVE:                                                                                                                                                                                           SUBJECTIVE STATEMENT: My left foot is hurting so much more than my knee.  7/10 pain walking down hall.  Hard to tell how  much my knee is hurting past my ankle/foot pain I was able to wiggle my toes a little this morning for the first time.. I think the swelling has come down    Eval: Pt reports LBP and R buttocks pain. Injections into the back and piriformis are hit or miss in terms of relief. They are too expensive. Pt does have a [employer] membership. Pt has been dealing with this condition for quite some time with multiple episodes of PT and chiro. Pain will down into the posterior and will be just above the knee and will start having R calf cramps. Pt is unable to walk a mile like he used to , he is only down to .25 miles now with shooting. Pt is limited to 15 mins of sitting before pain starts. Denies cancer red flags.   PERTINENT HISTORY:  CHF, CAD, COPD, HTN, CKD, recent hernia repair, R TSA,   PAIN:  Are you having pain? Yes: NPRS scale: 7/10 consistently Pain location: R QL, middle lower back, sacral/glute on R> L Pain description: sharp Aggravating factors: extension, sitting for too long,   Relieving factors: chiro, resting, meds, movement, lumbar support   12/24/23 Are you having pain? Yes: NPRS scale: current 2/10;  worst 10/10,  Pain location: Left knee Pain description: achy Aggravating factors: movement and weight bearing Relieving  factors: resting , meds, ice   Yes: NPRS scale: current 4/10; worst: 5/10 Pain location: Left Ankle/foot Pain description: numbness, tingling and pain with raising pain rising toward knee Aggravating factors: movement and weight bearing Relieving factors: rest, meds, ice PRECAUTIONS: ICD/Pacemaker last shocked registered on Oct 14, 2023  RED FLAGS: None   WEIGHT BEARING RESTRICTIONS: No WBAT 12/16/23  FALLS:  Has patient fallen in last 6 months? Yes. Number of falls 3, Off ladder; off bed; in parking garage  LIVING ENVIRONMENT: Lives with: lives with their family, sister Lives in: House/apartment Stairs: to enter Has following equipment at home: has walking stick and walker available at home, does not use   OCCUPATION: retired   PLOF: Independent with basic ADLs  PATIENT GOALS: relieve back pain; wants to hike, wants to build out his mini camper to travel   12/24/23: heal to finish his mini camper and travel  OBJECTIVE:  Note: Objective measures were completed at Evaluation unless otherwise noted.  DIAGNOSTIC FINDINGS:  CT left knee from 11/09/2023 Lateral tibial plateau fracture with evidence of some impaction but no significantly displaced fragments.  Chondrocalcinosis noted within the medial and lateral joint spaces.    Moderate atherosclerotic calcifications within the abdominal aorta. A vascular stent overlies the L1-2 disc level.   IMPRESSION: 1. Moderate to severe levocurvature centered at L2. 2. Severe multilevel degenerative disc and endplate changes. 3. Mild retrolisthesis of L1 on L2.  PATIENT SURVEYS:  ODI: ODI Score = 21 points (42%)  Interpretation: 41% to 60% (severe disability): Pain is a primary problem for these patients, but they may also be experiencing significant problems in travel, personal care, social life, sexual activity and sleep. A detailed evaluation is appropriate.  12/24/23 LEFS: 30/80  COGNITION:   Overall cognitive status: WFL               SENSATION: NT into R thigh, neuropathy into distal LE      L knee in knee immobilizer  12/24/23: numbness and tingling left ankle and foot   LOWER EXTREMITY ROM:   Active ROM Right eval Left eval  Hip flexion      Hip extension      Hip abduction      Hip adduction      Hip internal rotation      Hip external rotation      Knee flexion   100  Knee extension   -15  Ankle dorsiflexion   Limited by edema   Ankle plantarflexion   Limited by edema  Ankle inversion     Ankle eversion       (Blank rows = not tested)     FUNCTIONAL TESTS: STS requires UE assist; L knee locked in knee immobilizer and NWB throughout transfers       GAIT: Bilat axillary crutches and RW with SBA and gait belt  Stairs: step to pattern with single crutch and R sided hand rail   Observation: scoliotic curves noted in lumbar and thoracic spine   12/24/23: Amb with RW (adjusted to height) x400 ft.  Cadence slowed gait antalgic. Using knee immobilizer       [facility] Adult PT Treatment:  DATE: 12/30/23 Pt seen for aquatic therapy today.  Treatment took place in water 3.5-4.75 ft in depth at the [facility] pool. Temp of water was 91.  Pt entered/exited the pool via stairs with heavy ue support then exits via lift    *walking forward, back and side stepping in 3.6 -4.4 ft with ue support of barbell multiple widths.   *Ue support on wall: toe raises; heel raises; HS curls; high knee marching ; hip add/abd; relaxed squats x 10 4.2 ft *seated on lift: cycling; hip add/abd; LAQ *forward marching with kick *side stepping ue support barbell  - rest periods given throughout.   Pt requires the buoyancy and hydrostatic pressure of water for support, and to offload joints by unweighting joint load by at least 50 % in navel deep water and by at least 75-80% in chest to neck deep water.  Viscosity of the water is needed for resistance of strengthening.  Water current perturbations provides challenge to standing balance requiring increased core activation.     12/24/23 Tibial plateau fx left assessment Exercises - Seated Quad Set  - 3-5 x daily - 7 x weekly - 1 sets - 5-10 reps - Long Sitting Quad Set  - 3-5 x daily - 7 x weekly - 1 sets - 5-10 reps - Supine Hip Abduction  - 3-5 x daily - 7 x weekly - 3-5 sets - 5-10 reps - Straight Leg Raise     6/26  Gait training: AD sizing, maintaining WB precautions, stair sequencing and safety; use of RW for level surfaces inside home and crutches for stair navigation   6/16  UPA L2-5 bilat grade III STM bilat paraspinal and QL R LE LAD with lumbar bias 5 min LAD with PPT   6/12 UPA L2-5 bilat grade III STM bilat paraspinal and QL R LE LAD with lumbar bias 5 min LAD with PPT   LTR 20x R lateral shift correction 2x10 at wall Lumbar flexion seated with L bias 2x10 Postural changes during the day, neck/shoulder/hip positioning  10/29/23  Exercises - Seated Sciatic Nerve Glide  - 2 x daily - 7 x weekly - 2 sets - 10 reps - 3 hold - Seated Quadratus Lumborum Stretch in Chair  - 2 x daily - 7 x weekly - 1 sets - 3 reps - 30 hold - Sit to Stand with Resistance Around Legs  - 1 x daily - 7 x weekly - 2 sets - 10 reps   PATIENT EDUCATION:    Education details: MOI, diagnosis, prognosis, anatomy, exercise progression, DOMS expectations, muscle firing,  envelope of function, HEP, POC   Person educated: Patient Education method: Explanation, Demonstration, Verbal cues, and Handouts Education comprehension: verbalized understanding, returned demonstration, and verbal cues required   HOME EXERCISE PROGRAM:    Access Code: [email or web address] URL: [email or web address] Date: 10/29/2023 Prepared by: Dale Call  Aquatic TBA   Access Code: [facility] URL: [email or web address] Date: 12/24/2023 Prepared by: Matilda Kohut   Exercises - Seated Quad Set  - 3-5 x daily - 7  x weekly - 1 sets - 5-10 reps - Long Sitting Quad Set  - 3-5 x daily - 7 x weekly - 1 sets - 5-10 reps - Supine Hip Abduction  - 3-5 x daily - 7 x weekly - 3-5 sets - 5-10 reps - Straight Leg Raise  - 3-5 x daily - 7 x weekly - 3-5 sets - 5-10 reps     ASSESSMENT:   CLINICAL IMPRESSION: Pt  reports good response from last/first aquatic session without any residual pain or excessive fatigue.  He complains of increasing Left medial ankle pain that shoots posterior to medial malleolus and around to instep which is more limiting then his knee pain.  He continues to use fww to and from setting but is completing the entire distance amb to and from setting rather than requiring WC assistance.  He reports compliance with land based Hep. He demonstrates less guarded movement submerged with improved confidence and reduces knee discomfort.  Goals ongoing.          Initial impression (tib plat fx) Patient is a 73 y.o. m who was seen today for physical therapy evaluation and treatment for Closed fracture of lateral portion of left tibial plateau. He is WBAT as per Md as fx is stable. He has been being seen also for LBP which is now on hold due to new fx.  He presents today for aquatic therapy intervention.  He is amb with fww.  Fww height adjusted as he maneuvers 400 ft ot pool setting without difficulty.  His fx is stabile not requiring surgical intervention. Has knee immobilizer that he is wearing when out of home. He did drive himself here today. He is now WBAT.  Reports climbing multiple steps daily to bed and bath.  Pt edu on proper technique for stair climbing using crutches. Objective testing demonstrates left knee ROM limited in both flex and extension       OBJECTIVE IMPAIRMENTS: Abnormal gait, decreased activity tolerance, decreased balance, decreased coordination, decreased endurance, decreased mobility, difficulty walking, decreased ROM, decreased strength, hypomobility, increased edema,  increased fascial restrictions, impaired perceived functional ability, impaired flexibility, improper body mechanics, and pain.    ACTIVITY LIMITATIONS: carrying, lifting, bending, sitting, standing, squatting, stairs, transfers, and locomotion level   PARTICIPATION LIMITATIONS: meal prep, cleaning, laundry, interpersonal relationship, driving, shopping, community activity, and yard work   PERSONAL FACTORS: PMH - chf, macular degeneration, DM, cervical CA are also affecting patient's functional outcome.    REHAB POTENTIAL: fair   CLINICAL DECISION MAKING: complicated/unstable   EVALUATION COMPLEXITY: moderate     GOALS:   SHORT TERM GOALS: Target date: 12/10/2023     Pt will become independent with HEP in order to demonstrate synthesis of PT education.     Goal status: Met 12/30/22    2. Pt will demonstrate at least a 12.8 improvement in Oswestry Index in order to demonstrate a clinically significant change in LBP and function.    Goal status: INITIAL     LONG TERM GOALS: Target date: 01/27/2024        Pt  will become independent with final HEP in order to demonstrate synthesis of PT education.    Goal status: INITIAL   2.  Pt will demonstrate at least a 25pt improvement in Oswestry Index in order to demonstrate a clinically significant change in LBP and function. Goal status: INITIAL   3.  Pt will be able to demonstrate/report ability to sit/stand/sleep for extended periods of time without pain in order to demonstrate functional improvement and tolerance to static positioning.    Goal status: INITIAL   4. Pt will be able to demonstrate/report ability to walk >25 mins without pain in order to demonstrate functional improvement and tolerance to exercise and community mobility.     Goal status: INITIAL     5. Pt to improve on LEFS by at least 9 point  (MCID)to demonstrate statistically significant Improvement in function.  Baseline: 30/80   Goals Status: New   6. Pt  will improve left knee extension to 0d and flex to at least 120d   Baseline: See chart   Goal Status: New   7. Pt will amb using [facility] community distances without limitation of knee pain   Baseline: Using fww x 400 ft   Goal status: New  8. Pt will report decrease in left knee pain by at least 75% for improved toleration to functional mobility/amb  Baseline:  Goal status: New  PLAN:   PLAN:   PT FREQUENCY: 1-2x/week   PT DURATION: 12 weeks    PLANNED INTERVENTIONS: Therapeutic exercises, Therapeutic activity, Neuromuscular re-education, Balance training, Gait training, Patient/Family education, Joint manipulation, Joint mobilization, Stair training, , DME instructions, Aquatic Therapy, Dry Needling, Electrical stimulation unattended, Spinal manipulation, Spinal mobilization, Cryotherapy, Moist heat, scar mobilization, Splintting, Taping, Vasopneumatic device, Traction, Ultrasound, Ionotophoresis 4mg /ml Dexamethasone , Manual therapy.   PLAN FOR NEXT SESSION: mobilizations; weight room progression, lumbar mobility 12/24/23: aquatic: gait, balance, core and LE strengthening; stretching, pain management   Ronal Foots) Luther Newhouse MPT 12/30/23 4:22 PM [facility] [address] [place], [place], [address] Phone: [phone]   Fax:  [phone]

## 2024-01-02 ENCOUNTER — Other Ambulatory Visit: Payer: Self-pay | Admitting: General Surgery

## 2024-01-02 DIAGNOSIS — K4021 Bilateral inguinal hernia, without obstruction or gangrene, recurrent: Secondary | ICD-10-CM | POA: Diagnosis not present

## 2024-01-08 ENCOUNTER — Encounter (HOSPITAL_BASED_OUTPATIENT_CLINIC_OR_DEPARTMENT_OTHER): Payer: Self-pay | Admitting: Physical Therapy

## 2024-01-08 ENCOUNTER — Ambulatory Visit (HOSPITAL_BASED_OUTPATIENT_CLINIC_OR_DEPARTMENT_OTHER): Payer: Self-pay | Admitting: Physical Therapy

## 2024-01-08 ENCOUNTER — Ambulatory Visit (HOSPITAL_BASED_OUTPATIENT_CLINIC_OR_DEPARTMENT_OTHER): Admitting: Orthopaedic Surgery

## 2024-01-08 DIAGNOSIS — M5459 Other low back pain: Secondary | ICD-10-CM | POA: Diagnosis not present

## 2024-01-08 DIAGNOSIS — S82122A Displaced fracture of lateral condyle of left tibia, initial encounter for closed fracture: Secondary | ICD-10-CM | POA: Diagnosis not present

## 2024-01-08 DIAGNOSIS — M25562 Pain in left knee: Secondary | ICD-10-CM

## 2024-01-08 DIAGNOSIS — M6281 Muscle weakness (generalized): Secondary | ICD-10-CM

## 2024-01-08 DIAGNOSIS — R262 Difficulty in walking, not elsewhere classified: Secondary | ICD-10-CM | POA: Diagnosis not present

## 2024-01-08 NOTE — Therapy (Signed)
 OUTPATIENT PHYSICAL THERAPY LOWER EXTREMITY TREATMENT   Patient Name: Andrew Townsend MRN: [id number] DOB:June 23, 1950, 73 y.o., male Today's Date: 01/08/2024  END OF SESSION:  PT End of Session - 01/08/24 1419     Visit Number 8    Number of Visits 18    Date for PT Re-Evaluation 01/27/24    Authorization Type [employer] MCR    Authorization Time Period 8/5 - 10/3    Authorization - Number of Visits 20    Progress Note Due on Visit 10    PT Start Time 1415    PT Stop Time 1510    PT Time Calculation (min) 55 min    Activity Tolerance Patient tolerated treatment well;Patient limited by pain    Behavior During Therapy WFL for tasks assessed/performed              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)    Medtronic   Myocardial infarction (HCC)    Stroke (HCC)    Past Surgical History:  Procedure Laterality Date   arm surgery Right    broken - pins placed but now removed   COLONOSCOPY     CORONARY ARTERY BYPASS GRAFT     in [place]   EYE SURGERY Bilateral    remove cataracts   HERNIA REPAIR     ICD IMPLANT     Medtronic   INGUINAL HERNIA REPAIR Bilateral 08/19/2023   Procedure: OPEN BILATERAL INGUINAL HERNIA REPAIR WITH MESH;  Surgeon: Rubin Calamity, MD;  Location: [facility] OR;  Service: General;  Laterality: Bilateral;   JOINT REPLACEMENT Right    shoulder in [place]   MANDIBLE SURGERY     lower  jaw surgery for placement of dentures per patient   MULTIPLE TOOTH EXTRACTIONS     all teeth pulled - dentures   TONSILLECTOMY     UPPER GI ENDOSCOPY     WISDOM TOOTH EXTRACTION     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) 12/21/2021   Piriformis syndrome of both sides 12/21/2021   Hx of shoulder surgery (left rotator cuff surgery, right rotator cuff then reverse total shoulder arthoplasty) 12/07/2021   Lumbar facet arthropathy 12/07/2021   Bilateral primary osteoarthritis of knee 12/07/2021   Sacroiliac joint pain 12/07/2021   Gastroesophageal reflux disease 10/27/2021   S/P CABG (coronary artery bypass graft) 05/26/2021   ICD (implantable cardioverter-defibrillator) in place 05/26/2021   COPD (chronic obstructive pulmonary disease) (HCC) 05/26/2021  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   Insomnia 05/26/2021   Atherosclerotic heart disease of native coronary artery with unspecified angina pectoris (HCC) 05/26/2021   BPH (benign prostatic hyperplasia) 03/25/2021   Stage 3b chronic kidney disease (HCC) 06/16/2018   Hypertension 04/03/2018   Hyperlipidemia 04/03/2018   Chronic pain 04/03/2018   Chronic systolic congestive heart failure (HCC) 04/03/2018   Gout 04/03/2018   Renal artery stenosis (HCC) 04/03/2018   Thrombocytopenia (HCC) 04/03/2018   Anemia in chronic kidney disease 04/03/2018   Ischemic dilated cardiomyopathy (HCC) 06/29/2016   VT (ventricular tachycardia) (HCC) 06/28/2016    PCP:  Chandra Toribio POUR, MD     REFERRING  PROVIDER:  Chandra Toribio POUR, MD  12/24/23 LLE : Dr Elspeth Parker   REFERRING DIAG:  M54.16 (ICD-10-CM) - Lumbar radiculopathy  8/5/25Closed fx of left tibeal plateau    Rationale for Evaluation and Treatment: Rehabilitation  THERAPY DIAG:  Acute pain of left knee  Muscle weakness (generalized)  Other low back pain  ONSET DATE: >10 yrs  SUBJECTIVE:                                                                                                                                                                                           SUBJECTIVE STATEMENT: Saw Dr Parker. He is sending me to another MD for my foot.  May have to have hernia fixed again.  My foot (6/10)hurting more than knee (2/10) .    Eval: Pt reports LBP and R buttocks pain. Injections into the back and piriformis are hit or miss in terms of relief. They are too expensive. Pt does have a [employer] membership. Pt has been dealing with this condition for quite some time with multiple episodes of PT and chiro. Pain will down into the posterior and will be just above the knee and will start having R calf cramps. Pt is unable to walk a mile like he used to , he is only down to .25 miles now with shooting. Pt is limited to 15 mins of sitting before pain starts. Denies cancer red flags.   PERTINENT HISTORY:  CHF, CAD, COPD, HTN, CKD, recent hernia repair, R TSA,   PAIN:  Are you having pain? Yes: NPRS scale: 7/10 consistently Pain location: R QL, middle lower back, sacral/glute on R> L Pain description: sharp Aggravating factors: extension, sitting for too long,   Relieving factors: chiro, resting, meds, movement, lumbar support   12/24/23 Are you having pain? Yes: NPRS scale: current 2/10; worst 10/10,  Pain location: Left knee Pain description: achy Aggravating factors: movement and weight bearing Relieving factors: resting , meds, ice  Yes: NPRS scale: current 4/10; worst: 5/10 Pain location: Left Ankle/foot Pain  description: numbness, tingling and pain with raising pain rising toward knee Aggravating factors: movement and weight bearing Relieving factors: rest, meds, ice PRECAUTIONS: ICD/Pacemaker last shocked registered on Oct 14, 2023  RED FLAGS: None   WEIGHT BEARING RESTRICTIONS: No WBAT 12/16/23  FALLS:  Has patient fallen in last 6 months? Yes. Number of falls 3, Off ladder; off bed; in parking garage  LIVING ENVIRONMENT: Lives with: lives with their family, sister Lives in: House/apartment Stairs: to enter Has following equipment at home: has walking stick and walker available at home, does not use   OCCUPATION: retired   PLOF: Independent with basic ADLs  PATIENT GOALS: relieve back pain; wants to hike, wants to build out his mini camper to travel   12/24/23: heal to finish his mini camper and travel  OBJECTIVE:  Note: Objective measures were completed at Evaluation unless otherwise noted.  DIAGNOSTIC FINDINGS:  CT left knee from 11/09/2023 Lateral tibial plateau fracture with evidence of some impaction but no significantly displaced fragments.  Chondrocalcinosis noted within the medial and lateral joint spaces.    Moderate atherosclerotic calcifications within the abdominal aorta. A vascular stent overlies the L1-2 disc level.   IMPRESSION: 1. Moderate to severe levocurvature centered at L2. 2. Severe multilevel degenerative disc and endplate changes. 3. Mild retrolisthesis of L1 on L2.  PATIENT SURVEYS:  ODI: ODI Score = 21 points (42%)  Interpretation: 41% to 60% (severe disability): Pain is a primary problem for these patients, but they may also be experiencing significant problems in travel, personal care, social life, sexual activity and sleep. A detailed evaluation is appropriate.  12/24/23 LEFS: 30/80  COGNITION:   Overall cognitive status: WFL              SENSATION: NT into R thigh, neuropathy into distal LE      L knee in knee immobilizer  12/24/23:  numbness and tingling left ankle and foot   LOWER EXTREMITY ROM:   Active ROM Right eval Left eval  Hip flexion      Hip extension      Hip abduction      Hip adduction      Hip internal rotation      Hip external rotation      Knee flexion   100  Knee extension   -15  Ankle dorsiflexion   Limited by edema   Ankle plantarflexion   Limited by edema  Ankle inversion     Ankle eversion       (Blank rows = not tested)     FUNCTIONAL TESTS: STS requires UE assist; L knee locked in knee immobilizer and NWB throughout transfers       GAIT: Bilat axillary crutches and RW with SBA and gait belt  Stairs: step to pattern with single crutch and R sided hand rail   Observation: scoliotic curves noted in lumbar and thoracic spine   12/24/23: Amb with RW (adjusted to height) x400 ft.  Cadence slowed gait antalgic. Using knee immobilizer       [facility] Adult PT Treatment:                                                DATE: 01/08/24  Gait training using single crutch  Pt seen for aquatic therapy  today.  Treatment took place in water 3.5-4.75 ft in depth at the [facility] pool. Temp of water was 91.  Pt entered/exited the pool via stairs with heavy ue support then exits via lift    *walking forward, back and side stepping in 3.6 -4.4 ft with ue support of barbell multiple widths.   *forward march with knee kick *L stretch x 3 *walking across pool unsupported cues for arm swing. (Posture guarded) *Ue support on wall: toe raises; heel raises; hip add/abd; relaxed squats x 10 4.2 ft; hip flex/ext alternating *walking across pool unsupported cues for arm swing. *STS from water bench onto pool floor x 10 unsupported.  VC and demonstration for execution. Glute set once standing *seated on bench: cycling; hip add/abd; LAQ  - rest periods given throughout.   Pt requires the buoyancy and hydrostatic pressure of water for support, and to offload joints by unweighting joint load by at  least 50 % in navel deep water and by at least 75-80% in chest to neck deep water.  Viscosity of the water is needed for resistance of strengthening. Water current perturbations provides challenge to standing balance requiring increased core activation.     12/24/23 Tibial plateau fx left assessment Exercises - Seated Quad Set  - 3-5 x daily - 7 x weekly - 1 sets - 5-10 reps - Long Sitting Quad Set  - 3-5 x daily - 7 x weekly - 1 sets - 5-10 reps - Supine Hip Abduction  - 3-5 x daily - 7 x weekly - 3-5 sets - 5-10 reps - Straight Leg Raise     6/26  Gait training: AD sizing, maintaining WB precautions, stair sequencing and safety; use of RW for level surfaces inside home and crutches for stair navigation   6/16  UPA L2-5 bilat grade III STM bilat paraspinal and QL R LE LAD with lumbar bias 5 min LAD with PPT   6/12 UPA L2-5 bilat grade III STM bilat paraspinal and QL R LE LAD with lumbar bias 5 min LAD with PPT   LTR 20x R lateral shift correction 2x10 at wall Lumbar flexion seated with L bias 2x10 Postural changes during the day, neck/shoulder/hip positioning  10/29/23  Exercises - Seated Sciatic Nerve Glide  - 2 x daily - 7 x weekly - 2 sets - 10 reps - 3 hold - Seated Quadratus Lumborum Stretch in Chair  - 2 x daily - 7 x weekly - 1 sets - 3 reps - 30 hold - Sit to Stand with Resistance Around Legs  - 1 x daily - 7 x weekly - 2 sets - 10 reps   PATIENT EDUCATION:    Education details: MOI, diagnosis, prognosis, anatomy, exercise progression, DOMS expectations, muscle firing,  envelope of function, HEP, POC   Person educated: Patient Education method: Explanation, Demonstration, Verbal cues, and Handouts Education comprehension: verbalized understanding, returned demonstration, and verbal cues required   HOME EXERCISE PROGRAM:    Access Code: [email or web address] URL: [email or web address] Date: 10/29/2023 Prepared by: Dale Call  Aquatic TBA   Access Code:  [facility] URL: [email or web address] Date: 12/24/2023 Prepared by: Matilda Kohut   Exercises - Seated Quad Set  - 3-5 x daily - 7 x weekly - 1 sets - 5-10 reps - Long Sitting Quad Set  - 3-5 x daily - 7 x weekly - 1 sets - 5-10 reps - Supine Hip Abduction  - 3-5 x daily - 7 x weekly - 3-5 sets - 5-10 reps -  Straight Leg Raise  - 3-5 x daily - 7 x weekly - 3-5 sets - 5-10 reps     ASSESSMENT:   CLINICAL IMPRESSION: Pt arrives using 1 crutch.  He is given vc for proper pacing and coordination.  Gait :step length and cadence improved although pt continues with guarded posture. He reports numbness continues in right foot decreasing his ability to balance.  Dr Genelle is sending him to a foot specialist.  Pt reports he continues to wake frequently at night due to pain associated with left ft, knee, LB and abdomin but that knee has been overall reducing in pain. Pt has progressed today with amb submerged completing without ue support engaging ue arm swing mimicking more normal pattern. Goals ongoing            Initial impression (tib plat fx) Patient is a 73 y.o. m who was seen today for physical therapy evaluation and treatment for Closed fracture of lateral portion of left tibial plateau. He is WBAT as per Md as fx is stable. He has been being seen also for LBP which is now on hold due to new fx.  He presents today for aquatic therapy intervention.  He is amb with fww.  Fww height adjusted as he maneuvers 400 ft ot pool setting without difficulty.  His fx is stabile not requiring surgical intervention. Has knee immobilizer that he is wearing when out of home. He did drive himself here today. He is now WBAT.  Reports climbing multiple steps daily to bed and bath.  Pt edu on proper technique for stair climbing using crutches. Objective testing demonstrates left knee ROM limited in both flex and extension       OBJECTIVE IMPAIRMENTS: Abnormal gait, decreased activity tolerance,  decreased balance, decreased coordination, decreased endurance, decreased mobility, difficulty walking, decreased ROM, decreased strength, hypomobility, increased edema, increased fascial restrictions, impaired perceived functional ability, impaired flexibility, improper body mechanics, and pain.    ACTIVITY LIMITATIONS: carrying, lifting, bending, sitting, standing, squatting, stairs, transfers, and locomotion level   PARTICIPATION LIMITATIONS: meal prep, cleaning, laundry, interpersonal relationship, driving, shopping, community activity, and yard work   PERSONAL FACTORS: PMH - chf, macular degeneration, DM, cervical CA are also affecting patient's functional outcome.    REHAB POTENTIAL: fair   CLINICAL DECISION MAKING: complicated/unstable   EVALUATION COMPLEXITY: moderate     GOALS:   SHORT TERM GOALS: Target date: 12/10/2023     Pt will become independent with HEP in order to demonstrate synthesis of PT education.     Goal status: Met 12/30/22    2. Pt will demonstrate at least a 12.8 improvement in Oswestry Index in order to demonstrate a clinically significant change in LBP and function.    Goal status: INITIAL     LONG TERM GOALS: Target date: 01/27/2024        Pt  will become independent with final HEP in order to demonstrate synthesis of PT education.    Goal status: INITIAL   2.  Pt will demonstrate at least a 25pt improvement in Oswestry Index in order to demonstrate a clinically significant change in LBP and function. Goal status: INITIAL   3.  Pt will be able to demonstrate/report ability to sit/stand/sleep for extended periods of time without pain in order to demonstrate functional improvement and tolerance to static positioning.    Goal status: INITIAL   4. Pt will be able to demonstrate/report ability to walk >25 mins without pain in order to demonstrate functional  improvement and tolerance to exercise and community mobility.     Goal status: INITIAL      5. Pt to improve on LEFS by at least 9 point  (MCID)to demonstrate statistically significant Improvement in function.   Baseline: 30/80   Goals Status: New   6. Pt will improve left knee extension to 0d and flex to at least 120d   Baseline: See chart   Goal Status: New   7. Pt will amb using SPC community distances without limitation of knee pain   Baseline: Using fww x 400 ft; progressed to single crutch   Goal status: In progress 01/08/24  8. Pt will report decrease in left knee pain by at least 75% for improved toleration to functional mobility/amb  Baseline:  Goal status: New  PLAN:   PLAN:   PT FREQUENCY: 1-2x/week   PT DURATION: 12 weeks    PLANNED INTERVENTIONS: Therapeutic exercises, Therapeutic activity, Neuromuscular re-education, Balance training, Gait training, Patient/Family education, Joint manipulation, Joint mobilization, Stair training, , DME instructions, Aquatic Therapy, Dry Needling, Electrical stimulation unattended, Spinal manipulation, Spinal mobilization, Cryotherapy, Moist heat, scar mobilization, Splintting, Taping, Vasopneumatic device, Traction, Ultrasound, Ionotophoresis 4mg /ml Dexamethasone , Manual therapy.   PLAN FOR NEXT SESSION: mobilizations; weight room progression, lumbar mobility 12/24/23: aquatic: gait, balance, core and LE strengthening; stretching, pain management   Ronal Foots) Janaiya Beauchesne MPT 01/08/24 3:18 PM [facility] [address] [place], [place], [address] Phone: [phone]   Fax:  [phone]

## 2024-01-08 NOTE — Progress Notes (Signed)
 Chief Complaint: Left knee injury         History of Present Illness 01/08/2024: Presents today for follow-up of his left knee.  Overall he has been making progress in aquatic therapy.  Range of motion is improving nicely.  He has been out of his brace   Three days ago, he sustained a tibial plateau fracture after falling from the fifth rung of an eight-foot A-frame ladder. X-rays and a CT scan revealed a fracture of the lateral tibial plateau. He experiences significant knee pain, ranging from 3-4 at rest to 8-9 with movement. He has difficulty using crutches and slides on his buttocks to navigate stairs due to fear of falling. A recent fall occurred when he attempted to get out of bed without his brace, resulting in a loss of balance. He is concerned about navigating his home, especially carpeted areas, and accessing the upstairs bathroom.   He reports numbness in his foot and difficulty wiggling his toes, with minimal sensation in the toes and significant numbness in the foot. Ankle movement is limited and painful, with pain radiating up the leg. He is currently using oxycodone  10 mg a couple of times a day for pain management and is on Eliquis .     Surgical History:   None   PMH/PSH/Family History/Social History/Meds/Allergies:         Past Medical History:  Diagnosis Date   AICD (automatic cardioverter/defibrillator) present      Medtronic   Anemia     Arthritis     Blood transfusion without reported diagnosis      with heart surgery per patient   CAD (coronary artery disease)     Cataract      had surgery to remove   Chronic kidney disease      stage 3 per patient   COPD (chronic obstructive pulmonary disease) (HCC)     GERD (gastroesophageal reflux disease)     History of kidney stones      passed stones, no surgery per patient   Hyperlipidemia     Hypertension     Insomnia     Ischemic cardiomyopathy with implantable  cardioverter-defibrillator (ICD)      Medtronic   Myocardial infarction (HCC)     Stroke [facility])               Past Surgical History:  Procedure Laterality Date   arm surgery Right      broken - pins placed but now removed   COLONOSCOPY       CORONARY ARTERY BYPASS GRAFT        in [place]   EYE SURGERY Bilateral      remove cataracts   HERNIA REPAIR       ICD IMPLANT        Medtronic   INGUINAL HERNIA REPAIR Bilateral 08/19/2023    Procedure: OPEN BILATERAL INGUINAL HERNIA REPAIR WITH MESH;  Surgeon: Rubin Calamity, MD;  Location: [facility] OR;  Service: General;  Laterality: Bilateral;   JOINT REPLACEMENT Right      shoulder in [place]   MANDIBLE SURGERY        lower jaw surgery for placement of dentures per patient   MULTIPLE TOOTH EXTRACTIONS        all teeth pulled - dentures   TONSILLECTOMY       UPPER GI ENDOSCOPY       WISDOM TOOTH EXTRACTION            Social  History         Socioeconomic History   Marital status: Widowed      Spouse name: Not on file   Number of children: Not on file   Years of education: Not on file   Highest education level: GED or equivalent  Occupational History   Not on file  Tobacco Use   Smoking status: Former      Current packs/day: 1.00      Types: E-cigarettes, Cigarettes      Quit date: 2015   Smokeless tobacco: Current   Tobacco comments:      Quit smoking cigarettes in 2015 - 1 ppd  Vaping Use   Vaping status: Every Day   Substances: Nicotine  Substance and Sexual Activity   Alcohol use: Not Currently   Drug use: Never   Sexual activity: Not Currently  Other Topics Concern   Not on file  Social History Narrative   Not on file    Social Drivers of Health        Financial Resource Strain: Low Risk  (07/11/2023)    Overall Financial Resource Strain (CARDIA)     Difficulty of Paying Living Expenses: Not hard at all  Food Insecurity: No Food Insecurity (07/11/2023)    Hunger Vital Sign     Worried About Running Out  of Food in the Last Year: Never true     Ran Out of Food in the Last Year: Never true  Transportation Needs: No Transportation Needs (07/11/2023)    PRAPARE - Therapist, art (Medical): No     Lack of Transportation (Non-Medical): No  Physical Activity: Inactive (07/11/2023)    Exercise Vital Sign     Days of Exercise per Week: 0 days     Minutes of Exercise per Session: 0 min  Stress: No Stress Concern Present (07/11/2023)    [employer] of Occupational Health - Occupational Stress Questionnaire     Feeling of Stress : Not at all  Social Connections: Moderately Isolated (07/11/2023)    Social Connection and Isolation Panel     Frequency of Communication with Friends and Family: Twice a week     Frequency of Social Gatherings with Friends and Family: More than three times a week     Attends Religious Services: Never     Database administrator or Organizations: Yes     Attends Engineer, structural: More than 4 times per year     Marital Status: Widowed    No family history on file.     Allergies  No Known Allergies         Current Outpatient Medications  Medication Sig Dispense Refill   albuterol  (PROVENTIL ) (2.5 MG/3ML) 0.083% nebulizer solution INHALE THE CONTENTS OF 1 VIAL VIA NEBULIZER EVERY 6 HOURS AS NEEDED FOR WHEEZING OR SHORTNESS OF BREATH. 150 mL 1   albuterol  (VENTOLIN  HFA) 108 (90 Base) MCG/ACT inhaler Inhale 2 puffs into the lungs every 6 (six) hours as needed for wheezing or shortness of breath. 24 g 1   allopurinol  (ZYLOPRIM ) 100 MG tablet Take 1 tablet (100 mg total) by mouth daily. 90 tablet 1   apixaban  (ELIQUIS ) 2.5 MG TABS tablet TAKE 1 TABLET BY MOUTH 2 TIMES DAILY. 60 tablet 5   buprenorphine  (BUTRANS ) 20 MCG/HR PTWK Place 1 patch onto the skin once a week. 4 patch 2   docusate sodium (COLACE) 100 MG capsule Take 200 mg by mouth daily as needed  for mild constipation.       empagliflozin  (JARDIANCE ) 10 MG TABS tablet Take 1  tablet (10 mg total) by mouth daily before breakfast. 90 tablet 3   ezetimibe  (ZETIA ) 10 MG tablet Take 1 tablet (10 mg total) by mouth daily. 90 tablet 1   furosemide  (LASIX ) 40 MG tablet Take 1 tablet (40 mg total) by mouth daily as needed for fluid. 90 tablet 0   gabapentin  (NEURONTIN ) 600 MG tablet Take 2 tablets (1,200 mg total) by mouth at bedtime. 60 tablet 2   Magnesium Oxide (DIASENSE MAGNESIUM PO) Take 1,000 mg by mouth daily at 6 (six) AM.       metoprolol  succinate (TOPROL -XL) 100 MG 24 hr tablet Take 1 tablet (100 mg total) by mouth daily. PLEASE NOTE DECREASED DOSE. 90 tablet 1   Multiple Vitamin (MULTIVITAMIN) tablet Take 1 tablet by mouth daily.       nitroGLYCERIN  (NITROSTAT ) 0.4 MG SL tablet Place 1 tablet (0.4 mg total) under the tongue every 5 (five) minutes as needed for chest pain. If second dose is needed to relieve chest pain, seek immediate emergency medical attention. 25 tablet 1   omeprazole  (PRILOSEC) 20 MG capsule Take 1 capsule (20 mg total) by mouth daily. 90 capsule 1   oxyCODONE -acetaminophen  (PERCOCET) 10-325 MG tablet Take 1 tablet by mouth every 8 (eight) hours as needed for pain. 90 tablet 0   [START ON 11/21/2023] oxyCODONE -acetaminophen  (PERCOCET) 10-325 MG tablet Take 1 tablet by mouth every 8 (eight) hours as needed for pain. 90 tablet 0   potassium chloride  (KLOR-CON  M) 10 MEQ tablet TAKE 1 TABLET (10 MEQ TOTAL) BY MOUTH ONCE DAILY. DO NOT CRUSH OR CHEW. 90 tablet 4   rosuvastatin  (CRESTOR ) 40 MG tablet Take 1 tablet (40 mg total) by mouth at bedtime. 90 tablet 1   tiZANidine  (ZANAFLEX ) 4 MG tablet Take 1 tablet (4 mg total) by mouth every 8 (eight) hours as needed. 90 tablet 5   TRELEGY ELLIPTA  100-62.5-25 MCG/ACT AEPB INHALE 1 PUFF INTO THE LUNGS DAILY. 180 each 1   Vibegron  (GEMTESA ) 75 MG TABS Take 1 tablet (75 mg total) by mouth daily.       Vibegron  (GEMTESA ) 75 MG TABS Take 1 tablet (75 mg total) by mouth daily. 30 tablet 11   zolpidem  (AMBIEN  CR) 6.25  MG CR tablet TAKE 1 TABLET (6.25 MG TOTAL) BY MOUTH AT BEDTIME AS NEEDED FOR SLEEP. 30 tablet 0      No current facility-administered medications for this visit.      Imaging Results (Last 48 hours)  No results found.     Review of Systems:   A ROS was performed including pertinent positives and negatives as documented in the HPI.   Physical Exam :   Constitutional: NAD and appears stated age Neurological: Alert and oriented Psych: Appropriate affect and cooperative There were no vitals taken for this visit.    Comprehensive Musculoskeletal Exam:     Patient is nonweightbearing with use of a wheelchair.  Left knee appears swollen with a moderate to large effusion.  Diffuse tenderness with palpation.  Active range of motion is from 10 to 40 degrees.  Slightly decreased sensation in the left lower leg.  Ankle dorsiflexion and plantarflexion is intact although elicits pain.  Pedal pulses palpable.   Imaging:   CT left knee from 11/09/2023 Lateral tibial plateau fracture with evidence of some impaction but no significantly displaced fragments.  Chondrocalcinosis noted within the medial and lateral joint  spaces.     I personally reviewed and interpreted the radiographs.          Assessment & Plan Tibial plateau fracture   He has an acute fracture of the left lateral tibial plateau.  Range of motion is improving nicely.  His weightbearing is progressively getting better with aquatic therapy.  He has a session today.  I will plan to see him back in 8 weeks for reassessment  Return to clinic 8 weeks for reassessment

## 2024-01-09 ENCOUNTER — Ambulatory Visit
Admission: RE | Admit: 2024-01-09 | Discharge: 2024-01-09 | Disposition: A | Discharge status: Home / Self Care | Source: Ambulatory Visit | Attending: General Surgery | Admitting: General Surgery

## 2024-01-09 DIAGNOSIS — K4021 Bilateral inguinal hernia, without obstruction or gangrene, recurrent: Secondary | ICD-10-CM | POA: Diagnosis not present

## 2024-01-10 ENCOUNTER — Ambulatory Visit (HOSPITAL_BASED_OUTPATIENT_CLINIC_OR_DEPARTMENT_OTHER): Payer: Self-pay | Admitting: Physical Therapy

## 2024-01-10 ENCOUNTER — Encounter (HOSPITAL_BASED_OUTPATIENT_CLINIC_OR_DEPARTMENT_OTHER): Payer: Self-pay | Admitting: Physical Therapy

## 2024-01-10 DIAGNOSIS — M6281 Muscle weakness (generalized): Secondary | ICD-10-CM

## 2024-01-10 DIAGNOSIS — S82122A Displaced fracture of lateral condyle of left tibia, initial encounter for closed fracture: Secondary | ICD-10-CM | POA: Diagnosis not present

## 2024-01-10 DIAGNOSIS — R262 Difficulty in walking, not elsewhere classified: Secondary | ICD-10-CM | POA: Diagnosis not present

## 2024-01-10 DIAGNOSIS — M25562 Pain in left knee: Secondary | ICD-10-CM | POA: Diagnosis not present

## 2024-01-10 DIAGNOSIS — M5459 Other low back pain: Secondary | ICD-10-CM

## 2024-01-10 NOTE — Therapy (Signed)
 OUTPATIENT PHYSICAL THERAPY LOWER EXTREMITY TREATMENT   Patient Name: Andrew Townsend MRN: [id number] DOB:September 07, 1950, 73 y.o., male Today's Date: 01/10/2024  END OF SESSION:  PT End of Session - 01/10/24 1113     Visit Number 9    Number of Visits 18    Date for PT Re-Evaluation 01/27/24    Authorization Type [employer] MCR    Authorization Time Period 8/5 - 10/3    Authorization - Number of Visits 20    Progress Note Due on Visit 10    PT Start Time 1102    PT Stop Time 1145    PT Time Calculation (min) 43 min    Activity Tolerance Patient tolerated treatment well;Patient limited by pain    Behavior During Therapy WFL for tasks assessed/performed               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)    Medtronic   Myocardial infarction (HCC)    Stroke (HCC)    Past Surgical History:  Procedure Laterality Date   arm surgery Right    broken - pins placed but now removed   COLONOSCOPY     CORONARY ARTERY BYPASS GRAFT     in [place]   EYE SURGERY Bilateral    remove cataracts   HERNIA REPAIR     ICD IMPLANT     Medtronic   INGUINAL HERNIA REPAIR Bilateral 08/19/2023   Procedure: OPEN BILATERAL INGUINAL HERNIA REPAIR WITH MESH;  Surgeon: Rubin Calamity, MD;  Location: [facility] OR;  Service: General;  Laterality: Bilateral;   JOINT REPLACEMENT Right    shoulder in [place]   MANDIBLE SURGERY      lower jaw surgery for placement of dentures per patient   MULTIPLE TOOTH EXTRACTIONS     all teeth pulled - dentures   TONSILLECTOMY     UPPER GI ENDOSCOPY     WISDOM TOOTH EXTRACTION     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) 12/21/2021   Piriformis syndrome of both sides 12/21/2021   Hx of shoulder surgery (left rotator cuff surgery, right rotator cuff then reverse total shoulder arthoplasty) 12/07/2021   Lumbar facet arthropathy 12/07/2021   Bilateral primary osteoarthritis of knee 12/07/2021   Sacroiliac joint pain 12/07/2021   Gastroesophageal reflux disease 10/27/2021   S/P CABG (coronary artery bypass graft) 05/26/2021   ICD (implantable cardioverter-defibrillator) in place 05/26/2021   COPD (chronic obstructive pulmonary disease) (HCC) 05/26/2021  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   Insomnia 05/26/2021   Atherosclerotic heart disease of native coronary artery with unspecified angina pectoris (HCC) 05/26/2021   BPH (benign prostatic hyperplasia) 03/25/2021   Stage 3b chronic kidney disease (HCC) 06/16/2018   Hypertension 04/03/2018   Hyperlipidemia 04/03/2018   Chronic pain 04/03/2018   Chronic systolic congestive heart failure (HCC) 04/03/2018   Gout 04/03/2018   Renal artery stenosis (HCC) 04/03/2018   Thrombocytopenia (HCC) 04/03/2018   Anemia in chronic kidney disease 04/03/2018   Ischemic dilated cardiomyopathy (HCC) 06/29/2016   VT (ventricular tachycardia) (HCC) 06/28/2016    PCP:  Chandra Toribio POUR, MD      REFERRING PROVIDER:  Chandra Toribio POUR, MD  12/24/23 LLE : Dr Elspeth Parker   REFERRING DIAG:  M54.16 (ICD-10-CM) - Lumbar radiculopathy  8/5/25Closed fx of left tibeal plateau    Rationale for Evaluation and Treatment: Rehabilitation  THERAPY DIAG:  Acute pain of left knee  Muscle weakness (generalized)  Other low back pain  ONSET DATE: >10 yrs  SUBJECTIVE:                                                                                                                                                                                           SUBJECTIVE STATEMENT: Shockwave therapy in 1 month. Felt good after last session.  Walking well with crutch. Left knee 2/10; left foot 5/10; lb 6-7/10   Eval: Pt reports LBP and R buttocks pain. Injections into the back and piriformis are hit or miss in terms of relief. They are too expensive. Pt does have a [employer] membership. Pt has been dealing with this condition for quite some time with multiple episodes of PT and chiro. Pain will down into the posterior and will be just above the knee and will start having R calf cramps. Pt is unable to walk a mile like he used to , he is only down to .25 miles now with shooting. Pt is limited to 15 mins of sitting before pain starts. Denies cancer red flags.   PERTINENT HISTORY:  CHF, CAD, COPD, HTN, CKD, recent hernia repair, R TSA,   PAIN:  Are you having pain? Yes: NPRS scale: 7/10 consistently Pain location: R QL, middle lower back, sacral/glute on R> L Pain description: sharp Aggravating factors: extension, sitting for too long,   Relieving factors: chiro, resting, meds, movement, lumbar support   12/24/23 Are you having pain? Yes: NPRS scale: current 2/10; worst 10/10,  Pain location: Left knee Pain description: achy Aggravating factors: movement and weight bearing Relieving factors: resting , meds, ice   Yes: NPRS scale: current 4/10; worst: 5/10 Pain  location: Left Ankle/foot Pain  description: numbness, tingling and pain with raising pain rising toward knee Aggravating factors: movement and weight bearing Relieving factors: rest, meds, ice PRECAUTIONS: ICD/Pacemaker last shocked registered on Oct 14, 2023  RED FLAGS: None   WEIGHT BEARING RESTRICTIONS: No WBAT 12/16/23  FALLS:  Has patient fallen in last 6 months? Yes. Number of falls 3, Off ladder; off bed; in parking garage  LIVING ENVIRONMENT: Lives with: lives with their family, sister Lives in: House/apartment Stairs: to enter Has following equipment at home: has walking stick and walker available at home, does not use   OCCUPATION: retired   PLOF: Independent with basic ADLs  PATIENT GOALS: relieve back pain; wants to hike, wants to build out his mini camper to travel   12/24/23: heal to finish his mini camper and travel  OBJECTIVE:  Note: Objective measures were completed at Evaluation unless otherwise noted.  DIAGNOSTIC FINDINGS:  CT left knee from 11/09/2023 Lateral tibial plateau fracture with evidence of some impaction but no significantly displaced fragments.  Chondrocalcinosis noted within the medial and lateral joint spaces.    Moderate atherosclerotic calcifications within the abdominal aorta. A vascular stent overlies the L1-2 disc level.   IMPRESSION: 1. Moderate to severe levocurvature centered at L2. 2. Severe multilevel degenerative disc and endplate changes. 3. Mild retrolisthesis of L1 on L2.  PATIENT SURVEYS:  ODI: ODI Score = 21 points (42%)  Interpretation: 41% to 60% (severe disability): Pain is a primary problem for these patients, but they may also be experiencing significant problems in travel, personal care, social life, sexual activity and sleep. A detailed evaluation is appropriate.  12/24/23 LEFS: 30/80  COGNITION:   Overall cognitive status: WFL              SENSATION: NT into R thigh, neuropathy into distal LE      L knee in knee immobilizer  12/24/23:  numbness and tingling left ankle and foot   LOWER EXTREMITY ROM:   Active ROM Right eval Left eval  Hip flexion      Hip extension      Hip abduction      Hip adduction      Hip internal rotation      Hip external rotation      Knee flexion   100  Knee extension   -15  Ankle dorsiflexion   Limited by edema   Ankle plantarflexion   Limited by edema  Ankle inversion     Ankle eversion       (Blank rows = not tested)     FUNCTIONAL TESTS: STS requires UE assist; L knee locked in knee immobilizer and NWB throughout transfers       GAIT: Bilat axillary crutches and RW with SBA and gait belt  Stairs: step to pattern with single crutch and R sided hand rail   Observation: scoliotic curves noted in lumbar and thoracic spine   12/24/23: Amb with RW (adjusted to height) x400 ft.  Cadence slowed gait antalgic. Using knee immobilizer       [facility] Adult PT Treatment:                                                DATE: 01/10/24   Self care:Instruction on use of a frozen water bottle to ice plantar aspect left foot, increasing pressure as  tolerated for 5 mins 2-3 x daily  Pt seen for aquatic therapy today.  Treatment took place in water 3.5-4.75 ft in depth at the [facility] pool. Temp of water was 91.  Pt entered/exited the pool via stairs with heavy ue support then exits via lift    *walking forward, back and side stepping in 3.6 -4.4 ft with ue support of barbell multiple widths.   *forward march with knee kick *L stretch x 3 *walking across pool unsupported cues for arm swing. (Reduced guarded posture) Cues for heel strike *Ue support on wall 4.3 ft: toe raises; heel raises; hip add/abd; relaxed squats x 10 4.2 ft; hip flex/ext alternating *Hamstring and gastroc stretching 2nd step lle.  (Pt instructed on decreased intensity for improved toleration of stretch) *walking as above   Pt requires the buoyancy and hydrostatic pressure of water for support, and to offload  joints by unweighting joint load by at least 50 % in navel deep water and by at least 75-80% in chest to neck deep water.  Viscosity of the water is needed for resistance of strengthening. Water current perturbations provides challenge to standing balance requiring increased core activation.     12/24/23 Tibial plateau fx left assessment Exercises - Seated Quad Set  - 3-5 x daily - 7 x weekly - 1 sets - 5-10 reps - Long Sitting Quad Set  - 3-5 x daily - 7 x weekly - 1 sets - 5-10 reps - Supine Hip Abduction  - 3-5 x daily - 7 x weekly - 3-5 sets - 5-10 reps - Straight Leg Raise     6/26  Gait training: AD sizing, maintaining WB precautions, stair sequencing and safety; use of RW for level surfaces inside home and crutches for stair navigation   6/16  UPA L2-5 bilat grade III STM bilat paraspinal and QL R LE LAD with lumbar bias 5 min LAD with PPT   6/12 UPA L2-5 bilat grade III STM bilat paraspinal and QL R LE LAD with lumbar bias 5 min LAD with PPT   LTR 20x R lateral shift correction 2x10 at wall Lumbar flexion seated with L bias 2x10 Postural changes during the day, neck/shoulder/hip positioning  10/29/23  Exercises - Seated Sciatic Nerve Glide  - 2 x daily - 7 x weekly - 2 sets - 10 reps - 3 hold - Seated Quadratus Lumborum Stretch in Chair  - 2 x daily - 7 x weekly - 1 sets - 3 reps - 30 hold - Sit to Stand with Resistance Around Legs  - 1 x daily - 7 x weekly - 2 sets - 10 reps   PATIENT EDUCATION:    Education details: MOI, diagnosis, prognosis, anatomy, exercise progression, DOMS expectations, muscle firing,  envelope of function, HEP, POC   Person educated: Patient Education method: Explanation, Demonstration, Verbal cues, and Handouts Education comprehension: verbalized understanding, returned demonstration, and verbal cues required   HOME EXERCISE PROGRAM:    Access Code: [email or web address] URL: [email or web address] Date: 10/29/2023 Prepared by:  Dale Call  Aquatic TBA   Access Code: [facility] URL: [email or web address] Date: 12/24/2023 Prepared by: Matilda Kohut   Exercises - Seated Quad Set  - 3-5 x daily - 7 x weekly - 1 sets - 5-10 reps - Long Sitting Quad Set  - 3-5 x daily - 7 x weekly - 1 sets - 5-10 reps - Supine Hip Abduction  - 3-5 x daily - 7 x weekly - 3-5 sets - 5-10 reps -  Straight Leg Raise  - 3-5 x daily - 7 x weekly - 3-5 sets - 5-10 reps     ASSESSMENT:   CLINICAL IMPRESSION: Pt arrives using 1 crutch with good execution and safety. Instructed pt to use ise massage on plantar aspect left foot using frozen water bottle. Messaged MD to clairfy for pt if he can come out of brace at pt's request. PA responded affirming that he can start weaning out. Pt demonstrating improvement in left foot/ankle and toe volitional movement/ROM. Pain continues along lateral malleolus ligaments and tendons reaching into plantar aspect of foot hoping to be minimized by use of ice. Actual knee pain minimal.  Gait submerged more normalized in chest deep water (~70% unloaded).  Goals ongoing    Initial impression (tib plat fx) Patient is a 73 y.o. m who was seen today for physical therapy evaluation and treatment for Closed fracture of lateral portion of left tibial plateau. He is WBAT as per Md as fx is stable. He has been being seen also for LBP which is now on hold due to new fx.  He presents today for aquatic therapy intervention.  He is amb with fww.  Fww height adjusted as he maneuvers 400 ft ot pool setting without difficulty.  His fx is stabile not requiring surgical intervention. Has knee immobilizer that he is wearing when out of home. He did drive himself here today. He is now WBAT.  Reports climbing multiple steps daily to bed and bath.  Pt edu on proper technique for stair climbing using crutches. Objective testing demonstrates left knee ROM limited in both flex and extension       OBJECTIVE IMPAIRMENTS:  Abnormal gait, decreased activity tolerance, decreased balance, decreased coordination, decreased endurance, decreased mobility, difficulty walking, decreased ROM, decreased strength, hypomobility, increased edema, increased fascial restrictions, impaired perceived functional ability, impaired flexibility, improper body mechanics, and pain.    ACTIVITY LIMITATIONS: carrying, lifting, bending, sitting, standing, squatting, stairs, transfers, and locomotion level   PARTICIPATION LIMITATIONS: meal prep, cleaning, laundry, interpersonal relationship, driving, shopping, community activity, and yard work   PERSONAL FACTORS: PMH - chf, macular degeneration, DM, cervical CA are also affecting patient's functional outcome.    REHAB POTENTIAL: fair   CLINICAL DECISION MAKING: complicated/unstable   EVALUATION COMPLEXITY: moderate     GOALS:   SHORT TERM GOALS: Target date: 12/10/2023     Pt will become independent with HEP in order to demonstrate synthesis of PT education.     Goal status: Met 12/30/22    2. Pt will demonstrate at least a 12.8 improvement in Oswestry Index in order to demonstrate a clinically significant change in LBP and function.    Goal status: INITIAL     LONG TERM GOALS: Target date: 01/27/2024        Pt  will become independent with final HEP in order to demonstrate synthesis of PT education.    Goal status: INITIAL   2.  Pt will demonstrate at least a 25pt improvement in Oswestry Index in order to demonstrate a clinically significant change in LBP and function. Goal status: INITIAL   3.  Pt will be able to demonstrate/report ability to sit/stand/sleep for extended periods of time without pain in order to demonstrate functional improvement and tolerance to static positioning.    Goal status: INITIAL   4. Pt will be able to demonstrate/report ability to walk >25 mins without pain in order to demonstrate functional improvement and tolerance to exercise and  community mobility.  Goal status: INITIAL     5. Pt to improve on LEFS by at least 9 point  (MCID)to demonstrate statistically significant Improvement in function.   Baseline: 30/80   Goals Status: New   6. Pt will improve left knee extension to 0d and flex to at least 120d   Baseline: See chart   Goal Status: New   7. Pt will amb using SPC community distances without limitation of knee pain   Baseline: Using fww x 400 ft; progressed to single crutch   Goal status: In progress 01/08/24  8. Pt will report decrease in left knee pain by at least 75% for improved toleration to functional mobility/amb  Baseline:  Goal status: New  PLAN:   PLAN:   PT FREQUENCY: 1-2x/week   PT DURATION: 12 weeks    PLANNED INTERVENTIONS: Therapeutic exercises, Therapeutic activity, Neuromuscular re-education, Balance training, Gait training, Patient/Family education, Joint manipulation, Joint mobilization, Stair training, , DME instructions, Aquatic Therapy, Dry Needling, Electrical stimulation unattended, Spinal manipulation, Spinal mobilization, Cryotherapy, Moist heat, scar mobilization, Splintting, Taping, Vasopneumatic device, Traction, Ultrasound, Ionotophoresis 4mg /ml Dexamethasone , Manual therapy.   PLAN FOR NEXT SESSION: mobilizations; weight room progression, lumbar mobility 12/24/23: aquatic: gait, balance, core and LE strengthening; stretching, pain management   Ronal Foots) Shanta Hartner MPT 01/10/24 11:14 AM [facility] [address] [place], [place], [address] Phone: [phone]   Fax:  [phone]

## 2024-01-13 ENCOUNTER — Ambulatory Visit (HOSPITAL_BASED_OUTPATIENT_CLINIC_OR_DEPARTMENT_OTHER): Admitting: Orthopaedic Surgery

## 2024-01-14 ENCOUNTER — Ambulatory Visit: Attending: Cardiology

## 2024-01-14 DIAGNOSIS — I255 Ischemic cardiomyopathy: Secondary | ICD-10-CM

## 2024-01-14 DIAGNOSIS — I420 Dilated cardiomyopathy: Secondary | ICD-10-CM

## 2024-01-14 NOTE — Patient Instructions (Signed)
 Follow up as scheduled.

## 2024-01-14 NOTE — Progress Notes (Signed)
 Full device interrogation not done. Reprogrammed ATP cycle length to . See attachment for details.

## 2024-01-22 ENCOUNTER — Ambulatory Visit (HOSPITAL_BASED_OUTPATIENT_CLINIC_OR_DEPARTMENT_OTHER): Attending: Family Medicine | Admitting: Physical Therapy

## 2024-01-22 ENCOUNTER — Encounter (HOSPITAL_BASED_OUTPATIENT_CLINIC_OR_DEPARTMENT_OTHER): Payer: Self-pay | Admitting: Physical Therapy

## 2024-01-22 DIAGNOSIS — M5459 Other low back pain: Secondary | ICD-10-CM | POA: Insufficient documentation

## 2024-01-22 DIAGNOSIS — M6281 Muscle weakness (generalized): Secondary | ICD-10-CM | POA: Insufficient documentation

## 2024-01-22 DIAGNOSIS — R262 Difficulty in walking, not elsewhere classified: Secondary | ICD-10-CM | POA: Diagnosis not present

## 2024-01-22 DIAGNOSIS — M25562 Pain in left knee: Secondary | ICD-10-CM | POA: Insufficient documentation

## 2024-01-22 NOTE — Therapy (Signed)
 OUTPATIENT PHYSICAL THERAPY LOWER EXTREMITY TREATMENT Progress Note/re-cert Reporting Period 10/29/23 to 01/22/24  See note below for Objective Data and Assessment of Progress/Goals.      Patient Name: Andrew Townsend MRN: [id number] DOB:12/18/1950, 73 y.o., male Today's Date: 01/22/2024  END OF SESSION:  PT End of Session - 01/22/24 1156     Visit Number 10    Number of Visits 20    Date for PT Re-Evaluation 03/06/24    Authorization Type [employer] MCR    Authorization Time Period 8/5 - 10/3    Authorization - Visit Number 6    Authorization - Number of Visits 20    Progress Note Due on Visit 20    PT Start Time 1146    PT Stop Time 1230    PT Time Calculation (min) 44 min    Activity Tolerance Patient tolerated treatment well    Behavior During Therapy WFL for tasks assessed/performed                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)    Medtronic   Myocardial infarction (HCC)    Stroke (HCC)    Past Surgical History:  Procedure Laterality Date   arm surgery Right    broken - pins placed but now removed   COLONOSCOPY     CORONARY ARTERY BYPASS GRAFT     in [place]   EYE SURGERY Bilateral    remove cataracts   HERNIA REPAIR     ICD IMPLANT     Medtronic   INGUINAL HERNIA REPAIR Bilateral 08/19/2023   Procedure: OPEN BILATERAL INGUINAL HERNIA REPAIR WITH MESH;  Surgeon: Rubin Calamity, MD;  Location: [facility] OR;  Service: General;  Laterality: Bilateral;   JOINT REPLACEMENT Right    shoulder in [place]   MANDIBLE SURGERY     lower jaw surgery for placement of dentures per patient   MULTIPLE TOOTH EXTRACTIONS     all teeth pulled - dentures   TONSILLECTOMY     UPPER GI ENDOSCOPY     WISDOM TOOTH EXTRACTION     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) 12/21/2021   Piriformis syndrome of both sides 12/21/2021   Hx of shoulder surgery (left rotator cuff surgery, right rotator cuff then reverse total shoulder arthoplasty) 12/07/2021   Lumbar facet arthropathy 12/07/2021   Bilateral primary osteoarthritis of knee 12/07/2021   Sacroiliac joint pain 12/07/2021   Gastroesophageal reflux disease  10/27/2021   S/P CABG (coronary artery bypass graft) 05/26/2021   ICD (implantable cardioverter-defibrillator) in place 05/26/2021   COPD (chronic obstructive pulmonary disease) (HCC) 05/26/2021   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   Insomnia 05/26/2021   Atherosclerotic heart disease of native coronary artery with unspecified angina pectoris (HCC) 05/26/2021   BPH (benign prostatic hyperplasia) 03/25/2021   Stage 3b chronic kidney disease (HCC) 06/16/2018   Hypertension 04/03/2018   Hyperlipidemia 04/03/2018   Chronic pain 04/03/2018   Chronic systolic congestive heart failure (HCC) 04/03/2018   Gout 04/03/2018   Renal artery stenosis (HCC) 04/03/2018   Thrombocytopenia (HCC) 04/03/2018   Anemia in chronic kidney disease  04/03/2018   Ischemic dilated cardiomyopathy (HCC) 06/29/2016   VT (ventricular tachycardia) (HCC) 06/28/2016    PCP:  Chandra Toribio POUR, MD     REFERRING PROVIDER:  Chandra Toribio POUR, MD  12/24/23 LLE : Dr Elspeth Parker   REFERRING DIAG:  M54.16 (ICD-10-CM) - Lumbar radiculopathy  8/5/25Closed fx of left tibeal plateau    Rationale for Evaluation and Treatment: Rehabilitation  THERAPY DIAG:  Acute pain of left knee  Muscle weakness (generalized)  Other low back pain  ONSET DATE: >10 yrs  SUBJECTIVE:                                                                                                                                                                                           SUBJECTIVE STATEMENT: Not using my crutch in home only when going out.  Left foot is keping from moving around like I would like. Left knee 1/10; left foot 5/10; lb 6-7/10   Eval: Pt reports LBP and R buttocks pain. Injections into the back and piriformis are hit or miss in terms of relief. They are too expensive. Pt does have a [employer] membership. Pt has been dealing with this condition for quite some time with multiple episodes of PT and chiro. Pain will down into the posterior and will be just above the knee and will start having R calf cramps. Pt is unable to walk a mile like he used to , he is only down to .25 miles now with shooting. Pt is limited to 15 mins of sitting before pain starts. Denies cancer red flags.   PERTINENT HISTORY:  CHF, CAD, COPD, HTN, CKD, recent hernia repair, R TSA,   PAIN:  Are you having pain? Yes: NPRS scale: 7/10 consistently Pain location: R QL, middle lower back, sacral/glute on R> L Pain description: sharp Aggravating factors: extension, sitting for too long,   Relieving factors: chiro, resting, meds, movement, lumbar support   12/24/23 Are you having pain? Yes:  NPRS scale: current 2/10; worst 10/10,  Pain location: Left knee Pain description: achy Aggravating  factors: movement and weight bearing Relieving factors: resting , meds, ice   Yes: NPRS scale: current 4/10; worst: 5/10 Pain location: Left Ankle/foot Pain description: numbness, tingling and pain with raising pain rising toward knee Aggravating factors: movement and weight bearing Relieving factors: rest, meds, ice PRECAUTIONS: ICD/Pacemaker last shocked registered on Oct 14, 2023  RED FLAGS: None   WEIGHT BEARING RESTRICTIONS: No WBAT 12/16/23  FALLS:  Has patient fallen in last 6 months? Yes. Number of falls 3, Off ladder; off bed; in parking garage  LIVING ENVIRONMENT: Lives with: lives with their family, sister Lives in: House/apartment Stairs: to enter Has following equipment at home: has walking stick and walker available at home, does not use   OCCUPATION: retired   PLOF: Independent with basic ADLs  PATIENT GOALS: relieve back pain; wants to hike, wants to build out his mini camper to travel   12/24/23: heal to finish his mini camper and travel  OBJECTIVE:  Note: Objective measures were completed at Evaluation unless otherwise noted.  DIAGNOSTIC FINDINGS:  CT left knee from 11/09/2023 Lateral tibial plateau fracture with evidence of some impaction but no significantly displaced fragments.  Chondrocalcinosis noted within the medial and lateral joint spaces.    Moderate atherosclerotic calcifications within the abdominal aorta. A vascular stent overlies the L1-2 disc level.   IMPRESSION: 1. Moderate to severe levocurvature centered at L2. 2. Severe multilevel degenerative disc and endplate changes. 3. Mild retrolisthesis of L1 on L2.  PATIENT SURVEYS:  ODI: ODI Score = 21 points (42%) 9/3 = 23 Interpretation: 41% to 60% (severe disability): Pain is a primary problem for these patients, but they may also be experiencing significant problems in travel, personal care, social life, sexual activity and sleep. A detailed evaluation is appropriate.  12/24/23 LEFS:  30/80 9/3 = 44/80  COGNITION:   Overall cognitive status: WFL              SENSATION: NT into R thigh, neuropathy into distal LE      L knee in knee immobilizer  12/24/23: numbness and tingling left ankle and foot   LOWER EXTREMITY ROM:   Active ROM Right eval Left eval Left  01/22/24  Hip flexion       Hip extension       Hip abduction       Hip adduction       Hip internal rotation       Hip external rotation       Knee flexion   100 120d  Knee extension   -15 -9 active  Ankle dorsiflexion   Limited by edema  15  Ankle plantarflexion   Limited by edema 40  Ankle inversion      Ankle eversion        (Blank rows = not tested)     FUNCTIONAL TESTS: STS requires UE assist; L knee locked in knee immobilizer and NWB throughout transfers       GAIT: Bilat axillary crutches and RW with SBA and gait belt  Stairs: step to pattern with single crutch and R sided hand rail   Observation: scoliotic curves noted in lumbar and thoracic spine   12/24/23: Amb with RW (adjusted to height) x400 ft.  Cadence slowed gait antalgic. Using knee immobilizer       [facility] Adult PT Treatment:  DATE: 01/22/24 Self care/edu: re-instruct on use of frozen water bottle for plantar foot.  Increase pressure on area as foot numbs Nerve pain; nerve impingement; pictures of plantar fascia  Re-cert testing: ROM  Pt seen for aquatic therapy today.  Treatment took place in water 3.5-4.75 ft in depth at the [facility] pool. Temp of water was 91.  Pt entered/exited the pool via stairs with heavy ue support then exits via lift    *walking forward, back and side stepping in 3.6 -4.4 ft with unsupported; unguarded posture with normal arm swing   *forward march with knee kick *L stretch x 3 *Ue support on wall 4.3 ft- ue support HB: toe raises; heel raises; hip add/abd; relaxed squats x 10 4.2 ft;  *Hamstring and gastroc stretching 2nd step lle.  (Pt  instructed on decreased intensity for improved toleration of stretch) *walking as above   Pt requires the buoyancy and hydrostatic pressure of water for support, and to offload joints by unweighting joint load by at least 50 % in navel deep water and by at least 75-80% in chest to neck deep water.  Viscosity of the water is needed for resistance of strengthening. Water current perturbations provides challenge to standing balance requiring increased core activation.     12/24/23 Tibial plateau fx left assessment Exercises - Seated Quad Set  - 3-5 x daily - 7 x weekly - 1 sets - 5-10 reps - Long Sitting Quad Set  - 3-5 x daily - 7 x weekly - 1 sets - 5-10 reps - Supine Hip Abduction  - 3-5 x daily - 7 x weekly - 3-5 sets - 5-10 reps - Straight Leg Raise     6/26  Gait training: AD sizing, maintaining WB precautions, stair sequencing and safety; use of RW for level surfaces inside home and crutches for stair navigation   6/16  UPA L2-5 bilat grade III STM bilat paraspinal and QL R LE LAD with lumbar bias 5 min LAD with PPT   6/12 UPA L2-5 bilat grade III STM bilat paraspinal and QL R LE LAD with lumbar bias 5 min LAD with PPT   LTR 20x R lateral shift correction 2x10 at wall Lumbar flexion seated with L bias 2x10 Postural changes during the day, neck/shoulder/hip positioning  10/29/23  Exercises - Seated Sciatic Nerve Glide  - 2 x daily - 7 x weekly - 2 sets - 10 reps - 3 hold - Seated Quadratus Lumborum Stretch in Chair  - 2 x daily - 7 x weekly - 1 sets - 3 reps - 30 hold - Sit to Stand with Resistance Around Legs  - 1 x daily - 7 x weekly - 2 sets - 10 reps   PATIENT EDUCATION:    Education details: MOI, diagnosis, prognosis, anatomy, exercise progression, DOMS expectations, muscle firing,  envelope of function, HEP, POC   Person educated: Patient Education method: Explanation, Demonstration, Verbal cues, and Handouts Education comprehension: verbalized  understanding, returned demonstration, and verbal cues required   HOME EXERCISE PROGRAM:    Access Code: [email or web address] URL: [email or web address] Date: 10/29/2023 Prepared by: Dale Call  Aquatic TBA   Access Code: [facility] URL: [email or web address] Date: 12/24/2023 Prepared by: Matilda Kohut   Exercises - Seated Quad Set  - 3-5 x daily - 7 x weekly - 1 sets - 5-10 reps - Long Sitting Quad Set  - 3-5 x daily - 7 x weekly - 1 sets - 5-10 reps - Supine Hip Abduction  -  3-5 x daily - 7 x weekly - 3-5 sets - 5-10 reps - Straight Leg Raise  - 3-5 x daily - 7 x weekly - 3-5 sets - 5-10 reps     ASSESSMENT:   CLINICAL IMPRESSION: PN: pt using SPC when out of home only. He reports left foot is limiting his amb > than LB or left knee.  He has been using frozen water bottle which has provided releif for ~ 1 hour after completed to plantar aspect left foot. He reports left knee pain has improved by 85% since onset. LBP has had no measureable change. ODI demonstrates a small increase in disability likely due to addition of tibeal plateau fx and use of crutch  Pt arrives using 1 crutch with good execution and safety. Instructed pt to use ise massage on plantar aspect left foot using frozen water bottle. Messaged MD to clairfy for pt if he can come out of brace at pt's request. PA responded affirming that he can start weaning out. Pt demonstrating improvement in left foot/ankle and toe volitional movement/ROM. Pain continues along lateral malleolus ligaments and tendons reaching into plantar aspect of foot hoping to be minimized by use of ice. Actual knee pain minimal.  Gait submerged more normalized in chest deep water (~70% unloaded).  Goals ongoing    Initial impression (tib plat fx) Patient is a 73 y.o. m who was seen today for physical therapy evaluation and treatment for Closed fracture of lateral portion of left tibial plateau. He is WBAT as per Md as fx is stable. He  has been being seen also for LBP which is now on hold due to new fx.  He presents today for aquatic therapy intervention.  He is amb with fww.  Fww height adjusted as he maneuvers 400 ft ot pool setting without difficulty.  His fx is stabile not requiring surgical intervention. Has knee immobilizer that he is wearing when out of home. He did drive himself here today. He is now WBAT.  Reports climbing multiple steps daily to bed and bath.  Pt edu on proper technique for stair climbing using crutches. Objective testing demonstrates left knee ROM limited in both flex and extension       OBJECTIVE IMPAIRMENTS: Abnormal gait, decreased activity tolerance, decreased balance, decreased coordination, decreased endurance, decreased mobility, difficulty walking, decreased ROM, decreased strength, hypomobility, increased edema, increased fascial restrictions, impaired perceived functional ability, impaired flexibility, improper body mechanics, and pain.    ACTIVITY LIMITATIONS: carrying, lifting, bending, sitting, standing, squatting, stairs, transfers, and locomotion level   PARTICIPATION LIMITATIONS: meal prep, cleaning, laundry, interpersonal relationship, driving, shopping, community activity, and yard work   PERSONAL FACTORS: PMH - chf, macular degeneration, DM, cervical CA are also affecting patient's functional outcome.    REHAB POTENTIAL: fair   CLINICAL DECISION MAKING: complicated/unstable   EVALUATION COMPLEXITY: moderate     GOALS:   SHORT TERM GOALS: Target date: 12/10/2023     Pt will become independent with HEP in order to demonstrate synthesis of PT education.     Goal status: Met 12/30/22    2. Pt will demonstrate at least a 12.8 improvement in Oswestry Index in order to demonstrate a clinically significant change in LBP and function.   Status: 23/50 Goal status: In progress     LONG TERM GOALS: Target date: 03/06/2024        Pt  will become independent with final HEP in  order to demonstrate synthesis of PT education.  Goal status: INITIAL   2.  Pt will demonstrate at least a 25pt improvement in Oswestry Index in order to demonstrate a clinically significant change in LBP and function. Goal status: In progress 01/22/24   3.  Pt will be able to demonstrate/report ability to sit/stand/sleep for extended periods of time without pain in order to demonstrate functional improvement and tolerance to static positioning.    Goal status: In progress 02/11/24   4. Pt will be able to demonstrate/report ability to walk >25 mins without pain in order to demonstrate functional improvement and tolerance to exercise and community mobility.     Goal status: INITIAL     5. Pt to improve on LEFS by at least 9 point  (MCID)to demonstrate statistically significant Improvement in function.   Baseline: 30/80; 44/80   Goals Status: Met 01/22/24   6. Pt will improve left knee extension to 0d and flex to at least 120d   Baseline: See chart   Goal Status: In progress 01/22/24   7. Pt will amb using SPC community distances without limitation of knee pain   Baseline: Using fww x 400 ft; progressed to single crutch   Goal status: In progress 01/08/24. Met 01/22/24 (pt limited by left foot pain not knee)  8. Pt will report decrease in left knee pain by at least 75% for improved toleration to functional mobility/amb  Baseline:  Goal status: Met 02/11/24 (85)  PLAN:   PLAN:   PT FREQUENCY: 1-2x/week   PT DURATION: 6 weeks    PLANNED INTERVENTIONS: Therapeutic exercises, Therapeutic activity, Neuromuscular re-education, Balance training, Gait training, Patient/Family education, Joint manipulation, Joint mobilization, Stair training, , DME instructions, Aquatic Therapy, Dry Needling, Electrical stimulation unattended, Spinal manipulation, Spinal mobilization, Cryotherapy, Moist heat, scar mobilization, Splintting, Taping, Vasopneumatic device, Traction, Ultrasound, Ionotophoresis 4mg /ml  Dexamethasone , Manual therapy.   PLAN FOR NEXT SESSION: mobilizations; weight room progression, lumbar mobility 12/24/23: aquatic: gait, balance, core and LE strengthening; stretching, pain management   Ronal Foots) Teion Ballin MPT 01/22/24 1:44 PM [facility] [address] [place], [place], [address] Phone: [phone]   Fax:  [phone]

## 2024-01-23 ENCOUNTER — Ambulatory Visit: Payer: Self-pay | Admitting: General Surgery

## 2024-01-23 DIAGNOSIS — K4140 Unilateral femoral hernia, with gangrene, not specified as recurrent: Secondary | ICD-10-CM | POA: Diagnosis not present

## 2024-01-23 NOTE — H&P (Signed)
 Chief Complaint: Follow-up     History of Present Illness: Nigil Braman is a 73 y.o. male who is seen today as an office consultation at the request of Dr. Arch for evaluation of Follow-up .    History of Present Illness Goodwin Kamphaus is a 73 year old male who presents with concerns about a new right-sided femoral hernia.  He has a new right-sided groin bulge with occasional pain, following a recent fall from a ladder that also resulted in a broken leg. He is concerned about the potential for the hernia to worsen, especially while traveling, as he plans to convert a minivan into a camper for travel across the [place] . He is undergoing rehabilitation for his broken leg and is considering coordinating the hernia repair with his recovery to avoid emergency situations while traveling. He is on oxycodone  for chronic back pain, with an increased dosage following his leg injury. He prefers effective pain management post-surgery due to his high tolerance to pain medications. No generalized abdominal pain, nausea, or vomiting. Occasional pain in the hernia area.    Review of Systems: A complete review of systems was obtained from the patient.  I have reviewed this information and discussed as appropriate with the patient.  See HPI as well for other ROS.  Review of Systems  Constitutional:  Negative for fever.  HENT:  Negative for congestion.   Eyes:  Negative for blurred vision.  Respiratory:  Negative for cough, shortness of breath and wheezing.   Cardiovascular:  Negative for chest pain and palpitations.  Gastrointestinal:  Negative for heartburn.  Genitourinary:  Negative for dysuria.  Musculoskeletal:  Negative for myalgias.  Skin:  Negative for rash.  Neurological:  Negative for dizziness and headaches.  Psychiatric/Behavioral:  Negative for depression and suicidal ideas.   All other systems reviewed and are negative.     Medical History: Past Medical  History: Diagnosis Date  Arthritis   CHF (congestive heart failure) (CMS/HHS-HCC)   COPD (chronic obstructive pulmonary disease) (CMS/HHS-HCC)   GERD (gastroesophageal reflux disease)   History of stroke   Hyperlipidemia   Hypertension    There is no problem list on file for this patient.   Past Surgical History: Procedure Laterality Date  OPEN BILATERAL INGUINAL HERNIA REPAIR WITH MESH  08/19/2023  Dr. Rubin  ENDOSCOPIC HARVEST RADIAL ARTERY FOR CABG    HERNIA REPAIR    JOINT REPLACEMENT    Right Kidney Stent      No Known Allergies  Current Outpatient Medications on File Prior to Visit Medication Sig Dispense Refill  albuterol  (PROVENTIL ) 2.5 mg /3 mL (0.083 %) nebulizer solution INHALE THE CONTENTS OF 1 VIAL VIA NEBULIZER EVERY 6 HOURS AS NEEDED FOR WHEEZING OR SHORTNESS OF BREATH.    allopurinoL  (ZYLOPRIM ) 100 MG tablet Take 100 mg by mouth once daily    buprenorphine  (BUTRANS ) 15 mcg/hour Place 1 patch onto the skin every 7 (seven) days    ELIQUIS  2.5 mg tablet Take 1 tablet by mouth 2 (two) times daily    empagliflozin  (JARDIANCE ) 10 mg tablet Take 10 mg by mouth once daily    ezetimibe  (ZETIA ) 10 mg tablet Take 1 tablet by mouth once daily    gabapentin  (NEURONTIN ) 300 MG capsule Take 900 mg by mouth at bedtime    multivitamin tablet Take 1 tablet by mouth once daily    nitroGLYcerin  (NITROSTAT ) 0.4 MG SL tablet Place 0.4 mg under the tongue    oxyCODONE -acetaminophen  (PERCOCET) 10-325 mg tablet TAKE 1  TABLET BY MOUTH EVERY 8 HOURS AS NEEDED FOR PAIN. EFFECTIVE 05/25/2023    potassium chloride  (KLOR-CON  M10) 10 mEq ER tablet Take 10 mEq by mouth once daily    rosuvastatin  (CRESTOR ) 40 MG tablet Take 40 mg by mouth at bedtime    tiZANidine  (ZANAFLEX ) 4 MG capsule Take 4 mg by mouth    TRELEGY ELLIPTA  100-62.5-25 mcg inhaler Inhale 1 Puff into the lungs once daily    FUROsemide  (LASIX ) 40 MG tablet Take 40 mg by mouth    metoprolol  SUCCinate (TOPROL -XL) 100 MG XL  tablet Take 100 mg by mouth    oxyBUTYnin  (DITROPAN -XL) 10 MG XL tablet Take 1 tablet by mouth once daily    ramelteon  (ROZEREM ) 8 mg tablet Take 8 mg by mouth    traMADoL  (ULTRAM ) 50 mg tablet Take 1 tablet (50 mg total) by mouth every 6 (six) hours as needed 20 tablet 0  No current facility-administered medications on file prior to visit.   History reviewed. No pertinent family history.   Social History  Tobacco Use Smoking Status Former  Types: Cigarettes Smokeless Tobacco Not on file    Social History  Socioeconomic History  Marital status: Widowed Tobacco Use  Smoking status: Former   Types: Cigarettes Vaping Use  Vaping status: Some Days Substance and Sexual Activity  Alcohol use: Not Currently  Drug use: Never  Social Drivers of Health  Financial Resource Strain: Low Risk  (12/20/2023)  Received from [facility]  Overall Financial Resource Strain (CARDIA)   How hard is it for you to pay for the very basics like food, housing, medical care, and heating?: Not very hard Food Insecurity: No Food Insecurity (12/20/2023)  Received from [facility]  Hunger Vital Sign   Within the past 12 months, you worried that your food would run out before you got the money to buy more.: Never true   Within the past 12 months, the food you bought just didn't last and you didn't have money to get more.: Never true Transportation Needs: Patient Declined (12/20/2023)  Received from [facility] - Transportation   In the past 12 months, has lack of transportation kept you from medical appointments or from getting medications?: Patient declined   In the past 12 months, has lack of transportation kept you from meetings, work, or from getting things needed for daily living?: Patient declined Physical Activity: Inactive (07/11/2023)  Received from [facility]  Exercise Vital Sign   On average, how many days per week do you engage in moderate to strenuous exercise (like a brisk walk)?: 0  days   On average, how many minutes do you engage in exercise at this level?: 0 min Stress: No Stress Concern Present (07/11/2023)  Received from [facility] - Occupational Stress Questionnaire   Feeling of Stress : Not at all Social Connections: Moderately Integrated (12/20/2023)  Received from [facility]  Social Connection and Isolation Panel   In a typical week, how many times do you talk on the phone with family, friends, or neighbors?: Twice a week   How often do you get together with friends or relatives?: More than three times a week   How often do you attend church or religious services?: 1 to 4 times per year   Do you belong to any clubs or organizations such as church groups, unions, fraternal or athletic groups, or school groups?: Yes   How often do you attend meetings of the  clubs or organizations you belong to?: 1 to 4 times per year   Are you married, widowed, divorced, separated, never married, or living with a partner?: Widowed Housing Stability: Unknown (07/11/2023)  Received from [facility]  Housing Stability Vital Sign   Unable to Pay for Housing in the Last Year: No   Homeless in the Last Year: No   Objective:   There were no vitals filed for this visit.  There is no height or weight on file to calculate BMI. Physical Exam Constitutional:      Appearance: Normal appearance.  HENT:     Head: Normocephalic and atraumatic.     Nose: Nose normal. No congestion.     Mouth/Throat:     Mouth: Mucous membranes are moist.     Pharynx: Oropharynx is clear.  Eyes:     Pupils: Pupils are equal, round, and reactive to light.  Cardiovascular:     Rate and Rhythm: Normal rate and regular rhythm.     Pulses: Normal pulses.     Heart sounds: Normal heart sounds. No murmur heard.    No friction rub. No gallop.  Pulmonary:     Effort: Pulmonary effort is normal. No respiratory distress.     Breath sounds: Normal breath sounds. No  stridor. No wheezing, rhonchi or rales.  Abdominal:     General: Abdomen is flat.     Hernia: A hernia is present. Hernia is present in the left inguinal area and right inguinal area.  Musculoskeletal:        General: Normal range of motion.     Cervical back: Normal range of motion.  Skin:    General: Skin is warm and dry.  Neurological:     General: No focal deficit present.     Mental Status: He is alert and oriented to person, place, and time.  Psychiatric:        Mood and Affect: Mood normal.        Thought Content: Thought content normal.       Assessment and Plan: Diagnoses and all orders for this visit:  Unilateral femoral hernia with gangrene, recurrence not specified    Lenzie Sandler is a 73 y.o. male   1.  We will proceed to the OR for a OPEN RIGHT inguinal hernia repair with mesh. 2. All risks and benefits were discussed with the patient, to generally include infection, bleeding, damage to surrounding structures, acute and chronic nerve pain, and recurrence. Alternatives were offered and described.  All questions were answered and the patient voiced understanding of the procedure and wishes to proceed at this point.       No follow-ups on file.  Lynda Leos, MD, [facility], [place] [facility]imally Invasive Surgery

## 2024-01-23 NOTE — H&P (View-Only) (Signed)
 REFERRING PHYSICIAN:  Self  PROVIDER:  LYNDA LEOS, MD  MRN: [id number] DOB: 07-19-1950 DATE OF ENCOUNTER: 01/23/2024  Subjective   Chief Complaint: Follow-up     History of Present Illness: Andrew Townsend is a 73 y.o. male who is seen today as an office consultation at the request of Dr. Arch for evaluation of Follow-up .    History of Present Illness Andrew Townsend is a 73 year old male who presents with concerns about a new right-sided femoral hernia.  He has a new right-sided groin bulge with occasional pain, following a recent fall from a ladder that also resulted in a broken leg. He is concerned about the potential for the hernia to worsen, especially while traveling, as he plans to convert a minivan into a camper for travel across the [place] . He is undergoing rehabilitation for his broken leg and is considering coordinating the hernia repair with his recovery to avoid emergency situations while traveling. He is on oxycodone  for chronic back pain, with an increased dosage following his leg injury. He prefers effective pain management post-Townsend due to his high tolerance to pain medications. No generalized abdominal pain, nausea, or vomiting. Occasional pain in the hernia area.   Review of Systems: A complete review of systems was obtained from the patient.  I have reviewed this information and discussed as appropriate with the patient.  See HPI as well for other ROS.  Review of Systems  Constitutional:  Negative for fever.  HENT:  Negative for congestion.   Eyes:  Negative for blurred vision.  Respiratory:  Negative for cough, shortness of breath and wheezing.   Cardiovascular:  Negative for chest pain and palpitations.  Gastrointestinal:  Negative for heartburn.  Genitourinary:  Negative for dysuria.  Musculoskeletal:  Negative for myalgias.  Skin:  Negative for rash.  Neurological:  Negative for dizziness and headaches.  Psychiatric/Behavioral:  Negative for  depression and suicidal ideas.   All other systems reviewed and are negative.     Medical History: Past Medical History:  Diagnosis Date  . Arthritis   . CHF (congestive heart failure) (CMS/HHS-HCC)   . COPD (chronic obstructive pulmonary disease) (CMS/HHS-HCC)   . GERD (gastroesophageal reflux disease)   . History of stroke   . Hyperlipidemia   . Hypertension     There is no problem list on file for this patient.   Past Surgical History:  Procedure Laterality Date  . OPEN BILATERAL INGUINAL HERNIA REPAIR WITH MESH  08/19/2023   Dr. LEOS  . ENDOSCOPIC HARVEST RADIAL ARTERY FOR CABG    . HERNIA REPAIR    . JOINT REPLACEMENT    . Right Kidney Stent       No Known Allergies  Current Outpatient Medications on File Prior to Visit  Medication Sig Dispense Refill  . albuterol  (PROVENTIL ) 2.5 mg /3 mL (0.083 %) nebulizer solution INHALE THE CONTENTS OF 1 VIAL VIA NEBULIZER EVERY 6 HOURS AS NEEDED FOR WHEEZING OR SHORTNESS OF BREATH.    . allopurinoL  (ZYLOPRIM ) 100 MG tablet Take 100 mg by mouth once daily    . buprenorphine  (BUTRANS ) 15 mcg/hour Place 1 patch onto the skin every 7 (seven) days    . ELIQUIS  2.5 mg tablet Take 1 tablet by mouth 2 (two) times daily    . empagliflozin  (JARDIANCE ) 10 mg tablet Take 10 mg by mouth once daily    . ezetimibe  (ZETIA ) 10 mg tablet Take 1 tablet by mouth once daily    .  gabapentin  (NEURONTIN ) 300 MG capsule Take 900 mg by mouth at bedtime    . multivitamin tablet Take 1 tablet by mouth once daily    . nitroGLYcerin  (NITROSTAT ) 0.4 MG SL tablet Place 0.4 mg under the tongue    . oxyCODONE -acetaminophen  (PERCOCET) 10-325 mg tablet TAKE 1 TABLET BY MOUTH EVERY 8 HOURS AS NEEDED FOR PAIN. EFFECTIVE 05/25/2023    . potassium chloride  (KLOR-CON  M10) 10 mEq ER tablet Take 10 mEq by mouth once daily    . rosuvastatin  (CRESTOR ) 40 MG tablet Take 40 mg by mouth at bedtime    . tiZANidine  (ZANAFLEX ) 4 MG capsule Take 4 mg by mouth    . TRELEGY  ELLIPTA 100-62.5-25 mcg inhaler Inhale 1 Puff into the lungs once daily    . FUROsemide  (LASIX ) 40 MG tablet Take 40 mg by mouth    . metoprolol  SUCCinate (TOPROL -XL) 100 MG XL tablet Take 100 mg by mouth    . oxyBUTYnin  (DITROPAN -XL) 10 MG XL tablet Take 1 tablet by mouth once daily    . ramelteon  (ROZEREM ) 8 mg tablet Take 8 mg by mouth    . traMADoL  (ULTRAM ) 50 mg tablet Take 1 tablet (50 mg total) by mouth every 6 (six) hours as needed 20 tablet 0   No current facility-administered medications on file prior to visit.    History reviewed. No pertinent family history.   Social History   Tobacco Use  Smoking Status Former  . Types: Cigarettes  Smokeless Tobacco Not on file     Social History   Socioeconomic History  . Marital status: Widowed  Tobacco Use  . Smoking status: Former    Types: Cigarettes  Vaping Use  . Vaping status: Some Days  Substance and Sexual Activity  . Alcohol use: Not Currently  . Drug use: Never   Social Drivers of Corporate investment banker Strain: Low Risk  (12/20/2023)   Received from [facility]   Overall Financial Resource Strain (CARDIA)   . How hard is it for you to pay for the very basics like food, housing, medical care, and heating?: Not very hard  Food Insecurity: No Food Insecurity (12/20/2023)   Received from [facility]   Hunger Vital Sign   . Within the past 12 months, you worried that your food would run out before you got the money to buy more.: Never true   . Within the past 12 months, the food you bought just didn't last and you didn't have money to get more.: Never true  Transportation Needs: Patient Declined (12/20/2023)   Received from [facility] - Transportation   . In the past 12 months, has lack of transportation kept you from medical appointments or from getting medications?: Patient declined   . In the past 12 months, has lack of transportation kept you from meetings, work, or from getting things needed for daily  living?: Patient declined  Physical Activity: Inactive (07/11/2023)   Received from [facility]   Exercise Vital Sign   . On average, how many days per week do you engage in moderate to strenuous exercise (like a brisk walk)?: 0 days   . On average, how many minutes do you engage in exercise at this level?: 0 min  Stress: No Stress Concern Present (07/11/2023)   Received from [facility] of Occupational Health - Occupational Stress Questionnaire   . Feeling of Stress : Not at all  Social Connections: Moderately Integrated (  12/20/2023)   Received from [facility]   Social Connection and Isolation Panel   . In a typical week, how many times do you talk on the phone with family, friends, or neighbors?: Twice a week   . How often do you get together with friends or relatives?: More than three times a week   . How often do you attend church or religious services?: 1 to 4 times per year   . Do you belong to any clubs or organizations such as church groups, unions, fraternal or athletic groups, or school groups?: Yes   . How often do you attend meetings of the clubs or organizations you belong to?: 1 to 4 times per year   . Are you married, widowed, divorced, separated, never married, or living with a partner?: Widowed  Housing Stability: Unknown (07/11/2023)   Received from [facility] Stability Vital Sign   . Unable to Pay for Housing in the Last Year: No   . Homeless in the Last Year: No    Objective:    There were no vitals filed for this visit.  There is no height or weight on file to calculate BMI. Physical Exam Constitutional:      Appearance: Normal appearance.  HENT:     Head: Normocephalic and atraumatic.     Nose: Nose normal. No congestion.     Mouth/Throat:     Mouth: Mucous membranes are moist.     Pharynx: Oropharynx is clear.  Eyes:     Pupils: Pupils are equal, round, and reactive to light.  Cardiovascular:     Rate and Rhythm: Normal rate  and regular rhythm.     Pulses: Normal pulses.     Heart sounds: Normal heart sounds. No murmur heard.    No friction rub. No gallop.  Pulmonary:     Effort: Pulmonary effort is normal. No respiratory distress.     Breath sounds: Normal breath sounds. No stridor. No wheezing, rhonchi or rales.  Abdominal:     General: Abdomen is flat.     Hernia: A hernia is present. Hernia is present in the left inguinal area and right inguinal area.  Musculoskeletal:        General: Normal range of motion.     Cervical back: Normal range of motion.  Skin:    General: Skin is warm and dry.  Neurological:     General: No focal deficit present.     Mental Status: He is alert and oriented to person, place, and time.  Psychiatric:        Mood and Affect: Mood normal.        Thought Content: Thought content normal.       Assessment and Plan:  Diagnoses and all orders for this visit:  Unilateral femoral hernia with gangrene, recurrence not specified    Andrew Townsend is a 73 y.o. male    We will proceed to the OR for a OPEN RIGHT inguinal hernia repair with mesh. All risks and benefits were discussed with the patient, to generally include infection, bleeding, damage to surrounding structures, acute and chronic nerve pain, and recurrence. Alternatives were offered and described.  All questions were answered and the patient voiced understanding of the procedure and wishes to proceed at this point.       No follow-ups on file.  Lynda Leos, MD, [facility], [facility]

## 2024-01-24 ENCOUNTER — Encounter (HOSPITAL_BASED_OUTPATIENT_CLINIC_OR_DEPARTMENT_OTHER): Payer: Self-pay | Admitting: Physical Therapy

## 2024-01-24 ENCOUNTER — Ambulatory Visit (HOSPITAL_BASED_OUTPATIENT_CLINIC_OR_DEPARTMENT_OTHER): Payer: Self-pay | Admitting: Physical Therapy

## 2024-01-24 ENCOUNTER — Telehealth: Payer: Self-pay

## 2024-01-24 DIAGNOSIS — M5459 Other low back pain: Secondary | ICD-10-CM

## 2024-01-24 DIAGNOSIS — R262 Difficulty in walking, not elsewhere classified: Secondary | ICD-10-CM | POA: Diagnosis not present

## 2024-01-24 DIAGNOSIS — M6281 Muscle weakness (generalized): Secondary | ICD-10-CM | POA: Diagnosis not present

## 2024-01-24 DIAGNOSIS — M25562 Pain in left knee: Secondary | ICD-10-CM | POA: Diagnosis not present

## 2024-01-24 NOTE — Telephone Encounter (Signed)
   Pre-operative Risk Assessment    Patient Name: Prayan Ulin  DOB: 1950/07/07 MRN: [id number]   Date of last office visit: 12/05/23 Date of next office visit: N/A   Request for Surgical Clearance    Procedure:  hernia surgery   Date of Surgery:  Clearance TBD                                 Surgeon:  Lynda Leos, MD  Surgeon's Group or Practice Name:  [facility]  Phone number:  [phone] Fax number:  [phone]   Type of Clearance Requested:   - Medical  - Pharmacy:  Hold Apixaban  (Eliquis ) Not indicated    Type of Anesthesia:  General    Additional requests/questions:    Bonney Rebeca Blight   01/24/2024, 4:49 PM

## 2024-01-24 NOTE — Therapy (Signed)
 OUTPATIENT PHYSICAL THERAPY LOWER EXTREMITY TREATMENT  Patient Name: Andrew Townsend MRN: [id number] DOB:August 26, 1950, 73 y.o., male Today's Date: 01/24/2024  END OF SESSION:  PT End of Session - 01/24/24 1609     Visit Number 11    Date for PT Re-Evaluation 03/06/24    Authorization Type [employer] MCR    Authorization Time Period 8/5 - 10/3    Authorization - Number of Visits 20    PT Start Time 1600    PT Stop Time 1640    PT Time Calculation (min) 40 min    Activity Tolerance Patient tolerated treatment well    Behavior During Therapy WFL for tasks assessed/performed                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)    Medtronic   Myocardial infarction (HCC)    Stroke (HCC)    Past Surgical History:  Procedure Laterality Date   arm surgery Right    broken - pins placed but now removed   COLONOSCOPY     CORONARY ARTERY BYPASS GRAFT     in [place]   EYE SURGERY Bilateral    remove cataracts   HERNIA REPAIR     ICD IMPLANT     Medtronic   INGUINAL HERNIA REPAIR Bilateral 08/19/2023   Procedure: OPEN BILATERAL INGUINAL HERNIA REPAIR WITH MESH;  Surgeon: Rubin Calamity, MD;  Location: [facility] OR;  Service: General;  Laterality: Bilateral;   JOINT REPLACEMENT Right    shoulder in [place]   MANDIBLE SURGERY     lower jaw surgery for placement of dentures per patient   MULTIPLE TOOTH EXTRACTIONS      all teeth pulled - dentures   TONSILLECTOMY     UPPER GI ENDOSCOPY     WISDOM TOOTH EXTRACTION     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) 12/21/2021   Piriformis syndrome of both sides 12/21/2021   Hx of shoulder surgery (left rotator cuff surgery, right rotator cuff then reverse total shoulder arthoplasty) 12/07/2021   Lumbar facet arthropathy 12/07/2021   Bilateral primary osteoarthritis of knee 12/07/2021   Sacroiliac joint pain 12/07/2021   Gastroesophageal reflux disease 10/27/2021   S/P CABG (coronary artery bypass graft) 05/26/2021   ICD (implantable cardioverter-defibrillator) in place 05/26/2021   COPD (chronic obstructive pulmonary disease) (HCC) 05/26/2021   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   Insomnia 05/26/2021   Atherosclerotic heart disease of native coronary artery with  unspecified angina pectoris (HCC) 05/26/2021   BPH (benign prostatic hyperplasia) 03/25/2021   Stage 3b chronic kidney disease (HCC) 06/16/2018   Hypertension 04/03/2018   Hyperlipidemia 04/03/2018   Chronic pain 04/03/2018   Chronic systolic congestive heart failure (HCC) 04/03/2018   Gout 04/03/2018   Renal artery stenosis (HCC) 04/03/2018   Thrombocytopenia (HCC) 04/03/2018   Anemia in chronic kidney disease 04/03/2018   Ischemic dilated cardiomyopathy (HCC) 06/29/2016   VT (ventricular tachycardia) (HCC) 06/28/2016    PCP:  Chandra Toribio POUR, MD     REFERRING PROVIDER:  Chandra Toribio POUR, MD  12/24/23 LLE : Dr Elspeth Parker   REFERRING  DIAG:  M54.16 (ICD-10-CM) - Lumbar radiculopathy  8/5/25Closed fx of left tibeal plateau    Rationale for Evaluation and Treatment: Rehabilitation  THERAPY DIAG:  Acute pain of left knee  Muscle weakness (generalized)  Other low back pain  ONSET DATE: >10 yrs  SUBJECTIVE:                                                                                                                                                                                           SUBJECTIVE STATEMENT: Pt reports some soreness in knee from hamstring stretches last session.  He reports he is having femoral hernia repaired 02/13/24. Pt reports he has been hesitant to do exercises at home as he doesn't want to irritate the hernia.    Eval: Pt reports LBP and R buttocks pain. Injections into the back and piriformis are hit or miss in terms of relief. They are too expensive. Pt does have a [employer] membership. Pt has been dealing with this condition for quite some time with multiple episodes of PT and chiro. Pain will down into the posterior and will be just above the knee and will start having R calf cramps. Pt is unable to walk a mile like he used to , he is only down to .25 miles now with shooting. Pt is limited to 15 mins of sitting before pain starts. Denies cancer red flags.   PERTINENT HISTORY:  CHF, CAD, COPD, HTN, CKD, recent hernia repair, R TSA,   PAIN:  Are you having pain? Yes: NPRS scale: 6-7/10 consistently Pain location: R QL, middle lower back, sacral/glute on R> L Pain description: sharp Aggravating factors: extension, sitting for too long,   Relieving factors: chiro, resting, meds, movement, lumbar support    Are you having pain? Yes: NPRS scale: current 2/10; worst 10/10,  Pain location: Left knee Pain description: achy Aggravating factors: movement and weight bearing Relieving factors: resting , meds, ice   Yes: NPRS scale: current 4/10; worst: 5/10  Pain location: Left Ankle/foot Pain  description: numbness, tingling and pain with raising pain rising toward knee Aggravating factors: movement and weight bearing Relieving factors: rest, meds, ice PRECAUTIONS: ICD/Pacemaker last shocked registered on Oct 14, 2023  RED FLAGS: None   WEIGHT BEARING RESTRICTIONS: No WBAT 12/16/23  FALLS:  Has patient fallen in last 6 months? Yes. Number of falls 3, Off ladder; off bed; in parking garage  LIVING ENVIRONMENT: Lives with: lives with their family, sister Lives in: House/apartment Stairs: to enter Has following equipment at home: has walking stick and walker available at home, does not use   OCCUPATION: retired   PLOF: Independent with basic ADLs  PATIENT GOALS: relieve back pain; wants to hike, wants to build out his mini camper to travel   12/24/23: heal to finish his mini camper and travel  OBJECTIVE:  Note: Objective measures were completed at Evaluation unless otherwise noted.  DIAGNOSTIC FINDINGS:  CT left knee from 11/09/2023 Lateral tibial plateau fracture with evidence of some impaction but no significantly displaced fragments.  Chondrocalcinosis noted within the medial and lateral joint spaces.    Moderate atherosclerotic calcifications within the abdominal aorta. A vascular stent overlies the L1-2 disc level.   IMPRESSION: 1. Moderate to severe levocurvature centered at L2. 2. Severe multilevel degenerative disc and endplate changes. 3. Mild retrolisthesis of L1 on L2.  PATIENT SURVEYS:  ODI: ODI Score = 21 points (42%) 9/3 = 23 Interpretation: 41% to 60% (severe disability): Pain is a primary problem for these patients, but they may also be experiencing significant problems in travel, personal care, social life, sexual activity and sleep. A detailed evaluation is appropriate.  12/24/23 LEFS: 30/80 9/3 = 44/80  COGNITION:   Overall cognitive status: WFL              SENSATION: NT into R thigh, neuropathy into distal LE      L knee in knee  immobilizer  12/24/23: numbness and tingling left ankle and foot   LOWER EXTREMITY ROM:   Active ROM Right eval Left eval Left  01/22/24  Hip flexion       Hip extension       Hip abduction       Hip adduction       Hip internal rotation       Hip external rotation       Knee flexion   100 120d  Knee extension   -15 -9 active  Ankle dorsiflexion   Limited by edema  15  Ankle plantarflexion   Limited by edema 40  Ankle inversion      Ankle eversion        (Blank rows = not tested)     FUNCTIONAL TESTS: STS requires UE assist; L knee locked in knee immobilizer and NWB throughout transfers       GAIT: Bilat axillary crutches and RW with SBA and gait belt  Stairs: step to pattern with single crutch and R sided hand rail   Observation: scoliotic curves noted in lumbar and thoracic spine   12/24/23: Amb with RW (adjusted to height) x400 ft.  Cadence slowed gait antalgic. Using knee immobilizer       [facility] Adult PT Treatment:  DATE: 01/24/24 Pt seen for aquatic therapy today.  Treatment took place in water 3.5-4.75 ft in depth at the [facility] pool. Temp of water was 91.  Pt entered/exited the pool via stairs with heavy ue support then exits via lift    *UE on barbell: walking forward, backward and side stepping in 4 ft - multiple laps of each * marching with normal arm swing   *forward walking with small knee kick * UE on wall:  heel/toe raises x 10 * Lower back stretch (ext and flexion) * UE on yellow hand floats:  hip abdct/ add x 10 each, toe taps front and back for hip ROM and balance  * TrA set with 1/2 hollow noodle pull down to thighs  * walking forward/ backward with row motion holding noodle * applied sensitive skin rock tape to Lt ankle (2 stirrups, one crossing at talus); pt instructed on safe tape removal.    Pt requires the buoyancy and hydrostatic pressure of water for support, and to offload joints by  unweighting joint load by at least 50 % in navel deep water and by at least 75-80% in chest to neck deep water.  Viscosity of the water is needed for resistance of strengthening. Water current perturbations provides challenge to standing balance requiring increased core activation.     12/24/23 Tibial plateau fx left assessment Exercises - Seated Quad Set  - 3-5 x daily - 7 x weekly - 1 sets - 5-10 reps - Long Sitting Quad Set  - 3-5 x daily - 7 x weekly - 1 sets - 5-10 reps - Supine Hip Abduction  - 3-5 x daily - 7 x weekly - 3-5 sets - 5-10 reps - Straight Leg Raise     6/26  Gait training: AD sizing, maintaining WB precautions, stair sequencing and safety; use of RW for level surfaces inside home and crutches for stair navigation   6/16  UPA L2-5 bilat grade III STM bilat paraspinal and QL R LE LAD with lumbar bias 5 min LAD with PPT   6/12 UPA L2-5 bilat grade III STM bilat paraspinal and QL R LE LAD with lumbar bias 5 min LAD with PPT   LTR 20x R lateral shift correction 2x10 at wall Lumbar flexion seated with L bias 2x10 Postural changes during the day, neck/shoulder/hip positioning  10/29/23  Exercises - Seated Sciatic Nerve Glide  - 2 x daily - 7 x weekly - 2 sets - 10 reps - 3 hold - Seated Quadratus Lumborum Stretch in Chair  - 2 x daily - 7 x weekly - 1 sets - 3 reps - 30 hold - Sit to Stand with Resistance Around Legs  - 1 x daily - 7 x weekly - 2 sets - 10 reps   PATIENT EDUCATION:    Education details: MOI, diagnosis, prognosis, anatomy, exercise progression, DOMS expectations, muscle firing,  envelope of function, HEP, POC   Person educated: Patient Education method: Explanation, Demonstration, Verbal cues,  Education comprehension: verbalized understanding, returned demonstration, and verbal cues required   HOME EXERCISE PROGRAM:    Access Code: [email or web address] URL: [email or web address] Date: 10/29/2023 Prepared by: Dale Call  Aquatic  TBA   Access Code: [facility] URL: [email or web address] Date: 12/24/2023 Prepared by: Matilda Kohut   Exercises - Seated Quad Set  - 3-5 x daily - 7 x weekly - 1 sets - 5-10 reps - Long Sitting Quad Set  - 3-5 x daily - 7 x weekly - 1 sets -  5-10 reps - Supine Hip Abduction  - 3-5 x daily - 7 x weekly - 3-5 sets - 5-10 reps - Straight Leg Raise  - 3-5 x daily - 7 x weekly - 3-5 sets - 5-10 reps     ASSESSMENT:   CLINICAL IMPRESSION: Pt reported no change in pain level in back, knee or ankle while exercising in water.  He did report some increase in lower back pain when leg moved past neutral into extension. Trial of sensitive skin rock tape applied to Lt ankle to assist with edema reduction and increased proprioception. Pt is progressing gradually towards remaining goals.     PN: pt using SPC when out of home only. He reports left foot is limiting his amb > than LB or left knee.  He has been using frozen water bottle which has provided releif for ~ 1 hour after completed to plantar aspect left foot. He reports left knee pain has improved by 85% since onset. LBP has had no measureable change. ODI demonstrates a small increase in disability likely due to addition of tibeal plateau fx and use of crutch     Initial impression (tib plat fx) Patient is a 73 y.o. m who was seen today for physical therapy evaluation and treatment for Closed fracture of lateral portion of left tibial plateau. He is WBAT as per Md as fx is stable. He has been being seen also for LBP which is now on hold due to new fx.  He presents today for aquatic therapy intervention.  He is amb with fww.  Fww height adjusted as he maneuvers 400 ft ot pool setting without difficulty.  His fx is stabile not requiring surgical intervention. Has knee immobilizer that he is wearing when out of home. He did drive himself here today. He is now WBAT.  Reports climbing multiple steps daily to bed and bath.  Pt edu on proper  technique for stair climbing using crutches. Objective testing demonstrates left knee ROM limited in both flex and extension       OBJECTIVE IMPAIRMENTS: Abnormal gait, decreased activity tolerance, decreased balance, decreased coordination, decreased endurance, decreased mobility, difficulty walking, decreased ROM, decreased strength, hypomobility, increased edema, increased fascial restrictions, impaired perceived functional ability, impaired flexibility, improper body mechanics, and pain.    ACTIVITY LIMITATIONS: carrying, lifting, bending, sitting, standing, squatting, stairs, transfers, and locomotion level   PARTICIPATION LIMITATIONS: meal prep, cleaning, laundry, interpersonal relationship, driving, shopping, community activity, and yard work   PERSONAL FACTORS: PMH - chf, macular degeneration, DM, cervical CA are also affecting patient's functional outcome.    REHAB POTENTIAL: fair   CLINICAL DECISION MAKING: complicated/unstable   EVALUATION COMPLEXITY: moderate     GOALS:   SHORT TERM GOALS: Target date: 12/10/2023     Pt will become independent with HEP in order to demonstrate synthesis of PT education.     Goal status: Met 12/30/22    2. Pt will demonstrate at least a 12.8 improvement in Oswestry Index in order to demonstrate a clinically significant change in LBP and function.   Status: 23/50 Goal status: In progress     LONG TERM GOALS: Target date: 03/06/2024        Pt  will become independent with final HEP in order to demonstrate synthesis of PT education.    Goal status: INITIAL   2.  Pt will demonstrate at least a 25pt improvement in Oswestry Index in order to demonstrate a clinically significant change in LBP and function. Goal  status: In progress 01/22/24   3.  Pt will be able to demonstrate/report ability to sit/stand/sleep for extended periods of time without pain in order to demonstrate functional improvement and tolerance to static positioning.     Goal status: In progress 02/11/24   4. Pt will be able to demonstrate/report ability to walk >25 mins without pain in order to demonstrate functional improvement and tolerance to exercise and community mobility.     Goal status: INITIAL     5. Pt to improve on LEFS by at least 9 point  (MCID)to demonstrate statistically significant Improvement in function.   Baseline: 30/80; 44/80   Goals Status: Met 01/22/24   6. Pt will improve left knee extension to 0d and flex to at least 120d   Baseline: See chart   Goal Status: In progress 01/22/24   7. Pt will amb using SPC community distances without limitation of knee pain   Baseline: Using fww x 400 ft; progressed to single crutch   Goal status:  Met 01/22/24 (pt limited by left foot pain not knee)  8. Pt will report decrease in left knee pain by at least 75% for improved toleration to functional mobility/amb  Baseline:  Goal status: Met 02/11/24 (85)  PLAN:   PLAN:   PT FREQUENCY: 1-2x/week   PT DURATION: 6 weeks    PLANNED INTERVENTIONS: Therapeutic exercises, Therapeutic activity, Neuromuscular re-education, Balance training, Gait training, Patient/Family education, Joint manipulation, Joint mobilization, Stair training, , DME instructions, Aquatic Therapy, Dry Needling, Electrical stimulation unattended, Spinal manipulation, Spinal mobilization, Cryotherapy, Moist heat, scar mobilization, Splintting, Taping, Vasopneumatic device, Traction, Ultrasound, Ionotophoresis 4mg /ml Dexamethasone , Manual therapy.   PLAN FOR NEXT SESSION: mobilizations; weight room progression, lumbar mobility 12/24/23: aquatic: gait, balance, core and LE strengthening; stretching, pain management  Delon Aquas, PTA 01/24/24 4:54 PM [facility] [address] [place], [place], [address] Phone: [phone]   Fax:  [phone]

## 2024-01-28 ENCOUNTER — Encounter: Payer: Self-pay | Admitting: Internal Medicine

## 2024-01-28 NOTE — Progress Notes (Signed)
 PERIOPERATIVE PRESCRIPTION FOR IMPLANTED CARDIAC DEVICE PROGRAMMING   Patient Information:  Patient: Andrew Townsend  MRN: [id number]  Date of Birth: Oct 02, 1950   Procedure:  hernia surgery    Date of Surgery:  Clearance TBD                                  Surgeon:  Lynda Leos, MD  Surgeon's Group or Practice Name:  [facility]  Phone number:  [phone] Fax number:  [phone]    Device Information:   Clinic EP Physician:   Dr. Ole Holts Device Type:  Defibrillator Manufacturer and Phone #:  Medtronic: [id number] Pacemaker Dependent?:  No Date of Last Device Check:  01/14/2024         Normal Device Function?:  Yes     Electrophysiologist's Recommendations:   Have magnet available. Provide continuous ECG monitoring when magnet is used or reprogramming is to be performed.  Procedure may interfere with device function.  Magnet should be placed over device during procedure.  Per Device Clinic Standing Orders, Andrew Townsend  01/28/2024 3:57 PM

## 2024-01-28 NOTE — Telephone Encounter (Signed)
 Patient with diagnosis of afib on Eliquis  for anticoagulation.    Procedure: Hernia repair surgery Date of procedure: 02/12/24   CHA2DS2-VASc Score = 6   This indicates a 9.7% annual risk of stroke. The patient's score is based upon: CHF History: 1 HTN History: 1 Diabetes History: 0 Stroke History: 2 Vascular Disease History: 1 Age Score: 1 Gender Score: 0      CrCl 33.3 mL/min Platelet count 143  Patient has not had an Afib/aflutter ablation or Watchman within the last 3 months or DCCV within the last 30 days   Per office protocol, patient can hold Eliquis  for 2 days prior to procedure.    **This guidance is not considered finalized until pre-operative APP has relayed final recommendations.**

## 2024-01-28 NOTE — Telephone Encounter (Signed)
 Dr. Waddell,  Andrew Townsend was recently seen in clinic by you on 12/05/2023.  He remained stable from a cardiac standpoint at that time.  His PMH includes paroxysmal atrial fibrillation, ICM, and he is status post ICD insertion.  He also has coronary artery disease and is status post CABG.  Would you be able to comment on cardiac risk for upcoming procedure?  Thank you for your help.  Please direct your response to CV DIV preop pool.  Andrew Townsend. [name] NP-C     01/28/2024, 3:42 PM [facility] [address] [place], [place] [address] Office [phone]

## 2024-01-29 ENCOUNTER — Ambulatory Visit (HOSPITAL_BASED_OUTPATIENT_CLINIC_OR_DEPARTMENT_OTHER): Admitting: Physical Therapy

## 2024-01-29 ENCOUNTER — Encounter (HOSPITAL_BASED_OUTPATIENT_CLINIC_OR_DEPARTMENT_OTHER): Payer: Self-pay | Admitting: Physical Therapy

## 2024-01-29 DIAGNOSIS — M5459 Other low back pain: Secondary | ICD-10-CM | POA: Diagnosis not present

## 2024-01-29 DIAGNOSIS — M6281 Muscle weakness (generalized): Secondary | ICD-10-CM | POA: Diagnosis not present

## 2024-01-29 DIAGNOSIS — M25562 Pain in left knee: Secondary | ICD-10-CM | POA: Diagnosis not present

## 2024-01-29 DIAGNOSIS — R262 Difficulty in walking, not elsewhere classified: Secondary | ICD-10-CM | POA: Diagnosis not present

## 2024-01-29 NOTE — Therapy (Signed)
 OUTPATIENT PHYSICAL THERAPY LOWER EXTREMITY TREATMENT  Patient Name: Andrew Townsend MRN: [id number] DOB:1950-10-16, 73 y.o., male Today's Date: 01/29/2024  END OF SESSION:  PT End of Session - 01/29/24 1146     Visit Number 12    Number of Visits 20    Date for PT Re-Evaluation 03/06/24    Authorization Type [employer] MCR    Authorization Time Period 8/5 - 10/3    Authorization - Visit Number 8    Authorization - Number of Visits 20    PT Start Time 1135    PT Stop Time 1215    PT Time Calculation (min) 40 min    Activity Tolerance Patient tolerated treatment well    Behavior During Therapy WFL for tasks assessed/performed                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)    Medtronic   Myocardial infarction (HCC)    Stroke (HCC)    Past Surgical History:  Procedure Laterality Date   arm surgery Right    broken - pins placed but now removed   COLONOSCOPY     CORONARY ARTERY BYPASS GRAFT     in [place]   EYE SURGERY Bilateral    remove cataracts   HERNIA REPAIR     ICD IMPLANT     Medtronic   INGUINAL HERNIA REPAIR Bilateral 08/19/2023   Procedure: OPEN BILATERAL INGUINAL HERNIA REPAIR WITH MESH;  Surgeon: Rubin Calamity, MD;  Location: [facility] OR;  Service: General;  Laterality: Bilateral;   JOINT REPLACEMENT Right    shoulder in [place]   MANDIBLE SURGERY     lower jaw surgery for  placement of dentures per patient   MULTIPLE TOOTH EXTRACTIONS     all teeth pulled - dentures   TONSILLECTOMY     UPPER GI ENDOSCOPY     WISDOM TOOTH EXTRACTION     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) 12/21/2021   Piriformis syndrome of both sides 12/21/2021   Hx of shoulder surgery (left rotator cuff surgery, right rotator cuff then reverse total shoulder arthoplasty) 12/07/2021   Lumbar facet arthropathy 12/07/2021   Bilateral primary osteoarthritis of knee 12/07/2021   Sacroiliac joint pain 12/07/2021   Gastroesophageal reflux disease 10/27/2021   S/P CABG (coronary artery bypass graft) 05/26/2021   ICD (implantable cardioverter-defibrillator) in place 05/26/2021   COPD (chronic obstructive pulmonary disease) (HCC) 05/26/2021   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   Insomnia 05/26/2021   Atherosclerotic heart disease of native coronary artery with unspecified angina pectoris (HCC) 05/26/2021   BPH (benign prostatic hyperplasia) 03/25/2021   Stage 3b chronic kidney disease (HCC) 06/16/2018   Hypertension 04/03/2018   Hyperlipidemia 04/03/2018   Chronic pain 04/03/2018   Chronic systolic congestive heart failure (HCC) 04/03/2018   Gout 04/03/2018   Renal artery stenosis (HCC) 04/03/2018   Thrombocytopenia (HCC) 04/03/2018   Anemia in chronic kidney disease 04/03/2018   Ischemic dilated cardiomyopathy (HCC) 06/29/2016   VT (ventricular tachycardia) (HCC) 06/28/2016    PCP:  Chandra Toribio POUR, MD     REFERRING PROVIDER:  Chandra Toribio POUR, MD  12/24/23 LLE : Dr Elspeth Parker   REFERRING DIAG:  M54.16 (ICD-10-CM) - Lumbar radiculopathy  8/5/25Closed fx of left tibeal plateau    Rationale for Evaluation and Treatment: Rehabilitation  THERAPY DIAG:  Acute pain of left knee  Muscle weakness (generalized)  Other low back pain  ONSET DATE: >10 yrs  SUBJECTIVE:                                                                                                                                                                                           SUBJECTIVE STATEMENT: Pt reports some reduction in numbness left foot not constant throughout day. Left knee pain 2/10    Eval: Pt reports LBP and R buttocks pain. Injections into the back and piriformis are hit or miss in terms of relief. They are too expensive. Pt does have a [employer] membership. Pt has been dealing with this condition for quite some time with multiple episodes of PT and chiro. Pain will down into the posterior and will be just above the knee and will start having R calf cramps. Pt is unable to walk a mile like he used to , he is only down to .25 miles now with shooting. Pt is limited to 15 mins of sitting before pain starts. Denies cancer red flags.   PERTINENT HISTORY:  CHF, CAD, COPD, HTN, CKD, recent hernia repair, R TSA,   PAIN:  Are you having pain? Yes: NPRS scale: 6-7/10 consistently Pain location: R QL, middle lower back, sacral/glute on R> L Pain description: sharp Aggravating factors: extension, sitting for too long,   Relieving factors: chiro, resting, meds, movement, lumbar support    Are you having pain? Yes: NPRS scale: current 2/10; worst 10/10,  Pain location: Left knee Pain description: achy Aggravating factors: movement and weight bearing Relieving factors: resting , meds, ice   Yes: NPRS scale: current 4/10; worst: 5/10 Pain location: Left Ankle/foot Pain description: numbness, tingling and  pain with raising pain rising toward  knee Aggravating factors: movement and weight bearing Relieving factors: rest, meds, ice PRECAUTIONS: ICD/Pacemaker last shocked registered on Oct 14, 2023  RED FLAGS: None   WEIGHT BEARING RESTRICTIONS: No WBAT 12/16/23  FALLS:  Has patient fallen in last 6 months? Yes. Number of falls 3, Off ladder; off bed; in parking garage  LIVING ENVIRONMENT: Lives with: lives with their family, sister Lives in: House/apartment Stairs: to enter Has following equipment at home: has walking stick and walker available at home, does not use   OCCUPATION: retired   PLOF: Independent with basic ADLs  PATIENT GOALS: relieve back pain; wants to hike, wants to build out his mini camper to travel   12/24/23: heal to finish his mini camper and travel  OBJECTIVE:  Note: Objective measures were completed at Evaluation unless otherwise noted.  DIAGNOSTIC FINDINGS:  CT left knee from 11/09/2023 Lateral tibial plateau fracture with evidence of some impaction but no significantly displaced fragments.  Chondrocalcinosis noted within the medial and lateral joint spaces.    Moderate atherosclerotic calcifications within the abdominal aorta. A vascular stent overlies the L1-2 disc level.   IMPRESSION: 1. Moderate to severe levocurvature centered at L2. 2. Severe multilevel degenerative disc and endplate changes. 3. Mild retrolisthesis of L1 on L2.  PATIENT SURVEYS:  ODI: ODI Score = 21 points (42%) 9/3 = 23 Interpretation: 41% to 60% (severe disability): Pain is a primary problem for these patients, but they may also be experiencing significant problems in travel, personal care, social life, sexual activity and sleep. A detailed evaluation is appropriate.  12/24/23 LEFS: 30/80 9/3 = 44/80  COGNITION:   Overall cognitive status: WFL              SENSATION: NT into R thigh, neuropathy into distal LE      L knee in knee immobilizer  12/24/23: numbness and tingling left ankle and foot   LOWER  EXTREMITY ROM:   Active ROM Right eval Left eval Left  01/22/24  Hip flexion       Hip extension       Hip abduction       Hip adduction       Hip internal rotation       Hip external rotation       Knee flexion   100 120d  Knee extension   -15 -9 active  Ankle dorsiflexion   Limited by edema  15  Ankle plantarflexion   Limited by edema 40  Ankle inversion      Ankle eversion        (Blank rows = not tested)     FUNCTIONAL TESTS: STS requires UE assist; L knee locked in knee immobilizer and NWB throughout transfers       GAIT: Bilat axillary crutches and RW with SBA and gait belt  Stairs: step to pattern with single crutch and R sided hand rail   Observation: scoliotic curves noted in lumbar and thoracic spine   12/24/23: Amb with RW (adjusted to height) x400 ft.  Cadence slowed gait antalgic. Using knee immobilizer       [facility] Adult PT Treatment:                                                DATE: 01/29/24 Pt seen for aquatic therapy today.  Treatment took place in water 3.5-4.75 ft in depth at the [facility] pool. Temp of water was 91.  Pt entered/exited the pool via stairs with heavy ue support then exits via lift    *Unsupported walking forward, backward and side stepping in 3.6 -4.0 - multiple laps of each *step ups onto bottom step leading R/L 2 x 5.  Cues for TKE left with rising. (Some discomfort initially but reduces with reps) *lateral step ups left x 8 *alternating step downs 2 x 5 (no pain) *L stretch *forward walking with small knee kick *Solid noodle pull down for TrA engagement wide stance then staggered x 10   Pt requires the buoyancy and hydrostatic pressure of water for support, and to offload joints by unweighting joint load by at least 50 % in navel deep water and by at least 75-80% in chest to neck deep water.  Viscosity of the water is needed for resistance of strengthening. Water current perturbations provides challenge to standing  balance requiring increased core activation.     12/24/23 Tibial plateau fx left assessment Exercises - Seated Quad Set  - 3-5 x daily - 7 x weekly - 1 sets - 5-10 reps - Long Sitting Quad Set  - 3-5 x daily - 7 x weekly - 1 sets - 5-10 reps - Supine Hip Abduction  - 3-5 x daily - 7 x weekly - 3-5 sets - 5-10 reps - Straight Leg Raise     6/26  Gait training: AD sizing, maintaining WB precautions, stair sequencing and safety; use of RW for level surfaces inside home and crutches for stair navigation   6/16  UPA L2-5 bilat grade III STM bilat paraspinal and QL R LE LAD with lumbar bias 5 min LAD with PPT   6/12 UPA L2-5 bilat grade III STM bilat paraspinal and QL R LE LAD with lumbar bias 5 min LAD with PPT   LTR 20x R lateral shift correction 2x10 at wall Lumbar flexion seated with L bias 2x10 Postural changes during the day, neck/shoulder/hip positioning  10/29/23  Exercises - Seated Sciatic Nerve Glide  - 2 x daily - 7 x weekly - 2 sets - 10 reps - 3 hold - Seated Quadratus Lumborum Stretch in Chair  - 2 x daily - 7 x weekly - 1 sets - 3 reps - 30 hold - Sit to Stand with Resistance Around Legs  - 1 x daily - 7 x weekly - 2 sets - 10 reps   PATIENT EDUCATION:    Education details: MOI, diagnosis, prognosis, anatomy, exercise progression, DOMS expectations, muscle firing,  envelope of function, HEP, POC   Person educated: Patient Education method: Explanation, Demonstration, Verbal cues,  Education comprehension: verbalized understanding, returned demonstration, and verbal cues required   HOME EXERCISE PROGRAM:    Access Code: [email or web address] URL: [email or web address] Date: 10/29/2023 Prepared by: Dale Call  Aquatic TBA   Access Code: [facility] URL: [email or web address] Date: 12/24/2023 Prepared by: Matilda Kohut   Exercises - Seated Quad Set  - 3-5 x daily - 7 x weekly - 1 sets - 5-10 reps - Long Sitting Quad Set  - 3-5 x daily  - 7 x weekly - 1 sets - 5-10 reps - Supine Hip Abduction  - 3-5 x daily - 7 x weekly - 3-5 sets - 5-10 reps - Straight Leg Raise  - 3-5 x daily - 7 x weekly - 3-5 sets - 5-10 reps     ASSESSMENT:   CLINICAL IMPRESSION:  Pt presents without AD today in setting. He demonstrates several improvements with walking toleration (distance and without need for ad), pain level reduced in knee and not limiting amb , reduction slightly in numbness left foot, and improving strength.  Pt reports he is not certain tape helped ankle pain/numbness . Good progression towards goals.   PN: pt using SPC when out of home only. He reports left foot is limiting his amb > than LB or left knee.  He has been using frozen water bottle which has provided releif for ~ 1 hour after completed to plantar aspect left foot. He reports left knee pain has improved by 85% since onset. LBP has had no measureable change. ODI demonstrates a small increase in disability likely due to addition of tibeal plateau fx and use of crutch     Initial impression (tib plat fx) Patient is a 73 y.o. m who was seen today for physical therapy evaluation and treatment for Closed fracture of lateral portion of left tibial plateau. He is WBAT as per Md as fx is stable. He has been being seen also for LBP which is now on hold due to new fx.  He presents today for aquatic therapy intervention.  He is amb with fww.  Fww height adjusted as he maneuvers 400 ft ot pool setting without difficulty.  His fx is stabile not requiring surgical intervention. Has knee immobilizer that he is wearing when out of home. He did drive himself here today. He is now WBAT.  Reports climbing multiple steps daily to bed and bath.  Pt edu on proper technique for stair climbing using crutches. Objective testing demonstrates left knee ROM limited in both flex and extension       OBJECTIVE IMPAIRMENTS: Abnormal gait, decreased activity tolerance, decreased balance, decreased  coordination, decreased endurance, decreased mobility, difficulty walking, decreased ROM, decreased strength, hypomobility, increased edema, increased fascial restrictions, impaired perceived functional ability, impaired flexibility, improper body mechanics, and pain.    ACTIVITY LIMITATIONS: carrying, lifting, bending, sitting, standing, squatting, stairs, transfers, and locomotion level   PARTICIPATION LIMITATIONS: meal prep, cleaning, laundry, interpersonal relationship, driving, shopping, community activity, and yard work   PERSONAL FACTORS: PMH - chf, macular degeneration, DM, cervical CA are also affecting patient's functional outcome.    REHAB POTENTIAL: fair   CLINICAL DECISION MAKING: complicated/unstable   EVALUATION COMPLEXITY: moderate     GOALS:   SHORT TERM GOALS: Target date: 12/10/2023     Pt will become independent with HEP in order to demonstrate synthesis of PT education.     Goal status: Met 12/30/22    2. Pt will demonstrate at least a 12.8 improvement in Oswestry Index in order to demonstrate a clinically significant change in LBP and function.   Status: 23/50 Goal status: In progress     LONG TERM GOALS: Target date: 03/06/2024        Pt  will become independent with final HEP in order to demonstrate synthesis of PT education.    Goal status: INITIAL   2.  Pt will demonstrate at least a 25pt improvement in Oswestry Index in order to demonstrate a clinically significant change in LBP and function. Goal status: In progress 01/22/24   3.  Pt will be able to demonstrate/report ability to sit/stand/sleep for extended periods of time without pain in order to demonstrate functional improvement and tolerance to static positioning.    Goal status: In progress 02/11/24   4. Pt will be able to demonstrate/report ability to  walk >25 mins without pain in order to demonstrate functional improvement and tolerance to exercise and community mobility.     Goal status:  INITIAL     5. Pt to improve on LEFS by at least 9 point  (MCID)to demonstrate statistically significant Improvement in function.   Baseline: 30/80; 44/80   Goals Status: Met 01/22/24   6. Pt will improve left knee extension to 0d and flex to at least 120d   Baseline: See chart   Goal Status: In progress 01/22/24   7. Pt will amb using SPC community distances without limitation of knee pain   Baseline: Using fww x 400 ft; progressed to single crutch   Goal status:  Met 01/22/24 (pt limited by left foot pain not knee)  8. Pt will report decrease in left knee pain by at least 75% for improved toleration to functional mobility/amb  Baseline:  Goal status: Met 02/11/24 (85)  PLAN:   PLAN:   PT FREQUENCY: 1-2x/week   PT DURATION: 6 weeks    PLANNED INTERVENTIONS: Therapeutic exercises, Therapeutic activity, Neuromuscular re-education, Balance training, Gait training, Patient/Family education, Joint manipulation, Joint mobilization, Stair training, , DME instructions, Aquatic Therapy, Dry Needling, Electrical stimulation unattended, Spinal manipulation, Spinal mobilization, Cryotherapy, Moist heat, scar mobilization, Splintting, Taping, Vasopneumatic device, Traction, Ultrasound, Ionotophoresis 4mg /ml Dexamethasone , Manual therapy.   PLAN FOR NEXT SESSION: mobilizations; weight room progression, lumbar mobility 12/24/23: aquatic: gait, balance, core and LE strengthening; stretching, pain management  Ronal Foots) Ennis Heavner MPT 01/29/24 12:46 PM [facility] [address] [place], [place], [address] Phone: [phone]   Fax:  [phone]

## 2024-01-31 ENCOUNTER — Encounter (HOSPITAL_BASED_OUTPATIENT_CLINIC_OR_DEPARTMENT_OTHER): Payer: Self-pay | Admitting: Physical Therapy

## 2024-01-31 ENCOUNTER — Ambulatory Visit (HOSPITAL_BASED_OUTPATIENT_CLINIC_OR_DEPARTMENT_OTHER): Admitting: Physical Therapy

## 2024-01-31 DIAGNOSIS — R262 Difficulty in walking, not elsewhere classified: Secondary | ICD-10-CM | POA: Diagnosis not present

## 2024-01-31 DIAGNOSIS — M5459 Other low back pain: Secondary | ICD-10-CM

## 2024-01-31 DIAGNOSIS — M25562 Pain in left knee: Secondary | ICD-10-CM

## 2024-01-31 DIAGNOSIS — M6281 Muscle weakness (generalized): Secondary | ICD-10-CM

## 2024-01-31 NOTE — Therapy (Signed)
 OUTPATIENT PHYSICAL THERAPY LOWER EXTREMITY TREATMENT  Patient Name: Andrew Townsend MRN: [id number] DOB:1950-07-01, 73 y.o., male Today's Date: 01/31/2024  END OF SESSION:  PT End of Session - 01/31/24 1147     Visit Number 13    Number of Visits 20    Date for PT Re-Evaluation 03/06/24    Authorization Type [employer] MCR    Authorization Time Period 8/5 - 10/3    Authorization - Visit Number 9    Authorization - Number of Visits 20    Progress Note Due on Visit 20    PT Start Time 1141    PT Stop Time 1220    PT Time Calculation (min) 39 min    Behavior During Therapy WFL for tasks assessed/performed                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)    Medtronic   Myocardial infarction (HCC)    Stroke (HCC)    Past Surgical History:  Procedure Laterality Date   arm surgery Right    broken - pins placed but now removed   COLONOSCOPY     CORONARY ARTERY BYPASS GRAFT     in [place]   EYE SURGERY Bilateral    remove cataracts   HERNIA REPAIR     ICD IMPLANT     Medtronic   INGUINAL HERNIA REPAIR Bilateral 08/19/2023   Procedure: OPEN BILATERAL INGUINAL HERNIA REPAIR WITH MESH;  Surgeon: Rubin Calamity, MD;  Location: [facility] OR;  Service: General;  Laterality: Bilateral;   JOINT REPLACEMENT Right    shoulder in [place]   MANDIBLE SURGERY     lower jaw surgery for placement of dentures per  patient   MULTIPLE TOOTH EXTRACTIONS     all teeth pulled - dentures   TONSILLECTOMY     UPPER GI ENDOSCOPY     WISDOM TOOTH EXTRACTION     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) 12/21/2021   Piriformis syndrome of both sides 12/21/2021   Hx of shoulder surgery (left rotator cuff surgery, right rotator cuff then reverse total shoulder arthoplasty) 12/07/2021   Lumbar facet arthropathy 12/07/2021   Bilateral primary osteoarthritis of knee 12/07/2021   Sacroiliac joint pain 12/07/2021   Gastroesophageal reflux disease 10/27/2021   S/P CABG (coronary artery bypass graft) 05/26/2021   ICD (implantable cardioverter-defibrillator) in place 05/26/2021   COPD (chronic obstructive pulmonary disease) (HCC) 05/26/2021   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   Insomnia 05/26/2021   Atherosclerotic heart disease of native coronary artery with unspecified angina pectoris (HCC) 05/26/2021   BPH (benign prostatic hyperplasia) 03/25/2021   Stage 3b chronic kidney disease (HCC) 06/16/2018   Hypertension 04/03/2018   Hyperlipidemia 04/03/2018   Chronic pain 04/03/2018   Chronic systolic congestive heart failure (HCC) 04/03/2018   Gout 04/03/2018   Renal artery stenosis (HCC) 04/03/2018   Thrombocytopenia (HCC) 04/03/2018   Anemia in chronic kidney disease 04/03/2018   Ischemic dilated cardiomyopathy (HCC) 06/29/2016   VT (ventricular tachycardia) (HCC) 06/28/2016    PCP:  Chandra Toribio POUR, MD     REFERRING PROVIDER:  Chandra Toribio POUR, MD  12/24/23 LLE  : Dr Elspeth Parker   REFERRING DIAG:  M54.16 (ICD-10-CM) - Lumbar radiculopathy  8/5/25Closed fx of left tibeal plateau    Rationale for Evaluation and Treatment: Rehabilitation  THERAPY DIAG:  Acute pain of left knee  Muscle weakness (generalized)  Other low back pain  ONSET DATE: >10 yrs  SUBJECTIVE:                                                                                                                                                                                           SUBJECTIVE STATEMENT: Pt reports that his Lt knee feels the same.  He reports intermittent Lt ankle swelling.  He reports he has been working on reciprocal pattern on stairs at home, but sometimes needs to revert back to step-to pattern. He reports some soreness from last aquatic session.     Eval: Pt reports LBP and R buttocks pain. Injections into the back and piriformis are hit or miss in terms of relief. They are too expensive. Pt does have a [employer] membership. Pt has been dealing with this condition for quite some time with multiple episodes of PT and chiro. Pain will down into the posterior and will be just above the knee and will start having R calf cramps. Pt is unable to walk a mile like he used to , he is only down to .25 miles now with shooting. Pt is limited to 15 mins of sitting before pain starts. Denies cancer red flags.   PERTINENT HISTORY:  CHF, CAD, COPD, HTN, CKD, recent hernia repair, R TSA,   PAIN:  Are you having pain? Yes: NPRS scale: 6-7/10 consistently Pain location: R QL, middle lower back, sacral/glute on R> L Pain description: sharp Aggravating factors: extension, sitting for too long,   Relieving factors: chiro, resting, meds, movement, lumbar support    Are you having pain? Yes: NPRS scale: current 2/10 Pain location: Left knee Pain description: achy Aggravating  factors: movement and weight bearing Relieving factors: resting , meds, ice  PRECAUTIONS:  ICD/Pacemaker last shocked registered on Oct 14, 2023  RED FLAGS: None   WEIGHT BEARING RESTRICTIONS: No WBAT 12/16/23  FALLS:  Has patient fallen in last 6 months? Yes. Number of falls 3, Off ladder; off bed; in parking garage  LIVING ENVIRONMENT: Lives with: lives with their family, sister Lives in: House/apartment Stairs: to enter Has following equipment at home: has walking stick and walker available at home, does not use   OCCUPATION: retired   PLOF: Independent with basic ADLs  PATIENT GOALS: relieve back pain; wants to hike, wants to build out his mini camper to travel   12/24/23: heal to finish his mini camper and travel  OBJECTIVE:  Note: Objective measures were completed at Evaluation unless otherwise noted.  DIAGNOSTIC FINDINGS:  CT left knee from 11/09/2023 Lateral tibial plateau fracture with evidence of some impaction but no significantly displaced fragments.  Chondrocalcinosis noted within the medial and lateral joint spaces.    Moderate atherosclerotic calcifications within the abdominal aorta. A vascular stent overlies the L1-2 disc level.   IMPRESSION: 1. Moderate to severe levocurvature centered at L2. 2. Severe multilevel degenerative disc and endplate changes. 3. Mild retrolisthesis of L1 on L2.  PATIENT SURVEYS:  ODI: ODI Score = 21 points (42%) 9/3 = 23 Interpretation: 41% to 60% (severe disability): Pain is a primary problem for these patients, but they may also be experiencing significant problems in travel, personal care, social life, sexual activity and sleep. A detailed evaluation is appropriate.  12/24/23 LEFS: 30/80 9/3 = 44/80  COGNITION:   Overall cognitive status: WFL              SENSATION: NT into R thigh, neuropathy into distal LE      L knee in knee immobilizer  12/24/23: numbness and tingling left ankle and foot   LOWER EXTREMITY ROM:   Active ROM Right eval Left eval Left  01/22/24  Hip flexion       Hip extension        Hip abduction       Hip adduction       Hip internal rotation       Hip external rotation       Knee flexion   100 120d  Knee extension   -15 -9 active  Ankle dorsiflexion   Limited by edema  15  Ankle plantarflexion   Limited by edema 40  Ankle inversion      Ankle eversion        (Blank rows = not tested)     FUNCTIONAL TESTS: STS requires UE assist; L knee locked in knee immobilizer and NWB throughout transfers       GAIT: Bilat axillary crutches and RW with SBA and gait belt  Stairs: step to pattern with single crutch and R sided hand rail   Observation: scoliotic curves noted in lumbar and thoracic spine   12/24/23: Amb with RW (adjusted to height) x400 ft.  Cadence slowed gait antalgic. Using knee immobilizer      [facility] Adult PT Treatment:                                                DATE: 01/31/24 Pt seen for aquatic therapy today.  Treatment took place in water 3.5-4.75 ft  in depth at the [facility] pool. Temp of water was 91.  Pt entered/exited the pool via stairs with heavy ue support then exits via lift   *Unsupported walking forward/ backward with cues for heel/toe and toe/heel pattern * unsupported side stepping  * UE On wall:  heel/toe raises x 10; hip abdct/add x 10 each *forward walking with small knee kick * L stretch at pool wall  * return to walking forward/backward with UE on yellow hand floats (reports reduced tension in LB) * side stepping with arm add/abdct with rainbow hand floats *Lt forward step ups/ Rt retro step down  x10; (x 5 with RLE)  *Lt lateral step ups x 10 * staggered stance with solid noodle pull down to thighs for core engagement * UE on yellow hand floats: tandem gait forward/ backward *L stretch / trunk ext stretch * plank position with hands on bench in water and alternating hip ext * applied sensitive skin rock tape to Lt ankle (2 stirrups, one crossing at talus); pt reminded of safe tape removal    [facility] Adult PT  Treatment:                                                DATE: 01/29/24 Pt seen for aquatic therapy today.  Treatment took place in water 3.5-4.75 ft in depth at the [facility] pool. Temp of water was 91.  Pt entered/exited the pool via stairs with heavy ue support then exits via lift    *Unsupported walking forward, backward and side stepping in 3.6 -4.0 - multiple laps of each *step ups onto bottom step leading R/L 2 x 5.  Cues for TKE left with rising. (Some discomfort initially but reduces with reps) *lateral step ups left x 8 *alternating step downs 2 x 5 (no pain) *L stretch *forward walking with small knee kick *Solid noodle pull down for TrA engagement wide stance then staggered x 10   12/24/23 Tibial plateau fx left assessment Exercises - Seated Quad Set  - 3-5 x daily - 7 x weekly - 1 sets - 5-10 reps - Long Sitting Quad Set  - 3-5 x daily - 7 x weekly - 1 sets - 5-10 reps - Supine Hip Abduction  - 3-5 x daily - 7 x weekly - 3-5 sets - 5-10 reps - Straight Leg Raise     6/26  Gait training: AD sizing, maintaining WB precautions, stair sequencing and safety; use of RW for level surfaces inside home and crutches for stair navigation   6/16  UPA L2-5 bilat grade III STM bilat paraspinal and QL R LE LAD with lumbar bias 5 min LAD with PPT   6/12 UPA L2-5 bilat grade III STM bilat paraspinal and QL R LE LAD with lumbar bias 5 min LAD with PPT   LTR 20x R lateral shift correction 2x10 at wall Lumbar flexion seated with L bias 2x10 Postural changes during the day, neck/shoulder/hip positioning  10/29/23  Exercises - Seated Sciatic Nerve Glide  - 2 x daily - 7 x weekly - 2 sets - 10 reps - 3 hold - Seated Quadratus Lumborum Stretch in Chair  - 2 x daily - 7 x weekly - 1 sets - 3 reps - 30 hold - Sit to Stand with Resistance Around Legs  - 1 x daily - 7 x  weekly - 2 sets - 10 reps   PATIENT EDUCATION:    Education details: exercise rationale  /progression/ modification, DOMS expectations, muscle firing Person educated: Patient Education method: Explanation, Demonstration, Verbal cues,  Education comprehension: verbalized understanding, returned demonstration, and verbal cues required   HOME EXERCISE PROGRAM:    Access Code: [email or web address] URL: [email or web address] Date: 10/29/2023 Prepared by: Dale Call  Aquatic TBA   Access Code: [facility] URL: [email or web address] Date: 12/24/2023 Prepared by: Matilda Kohut   Exercises - Seated Quad Set  - 3-5 x daily - 7 x weekly - 1 sets - 5-10 reps - Long Sitting Quad Set  - 3-5 x daily - 7 x weekly - 1 sets - 5-10 reps - Supine Hip Abduction  - 3-5 x daily - 7 x weekly - 3-5 sets - 5-10 reps - Straight Leg Raise  - 3-5 x daily - 7 x weekly - 3-5 sets - 5-10 reps     ASSESSMENT:   CLINICAL IMPRESSION: Back pain remains unchanged when in water. He requires frequent short rest breaks to stretch lower back at pool wall. Lt knee pain slightly reduced.  Lt knee ROM gradually improving. Steps ups tolerated well without increase in pain. 2nd trial of kinesiology tape applied to Lt ankle for increased proprioception and decreased swelling.  Good progression towards goals. Land therapist to update HEP next visit per pt request.    PN: pt using SPC when out of home only. He reports left foot is limiting his amb > than LB or left knee.  He has been using frozen water bottle which has provided releif for ~ 1 hour after completed to plantar aspect left foot. He reports left knee pain has improved by 85% since onset. LBP has had no measureable change. ODI demonstrates a small increase in disability likely due to addition of tibeal plateau fx and use of crutch     Initial impression (tib plat fx) Patient is a 73 y.o. m who was seen today for physical therapy evaluation and treatment for Closed fracture of lateral portion of left tibial plateau. He is WBAT as per Md as fx  is stable. He has been being seen also for LBP which is now on hold due to new fx.  He presents today for aquatic therapy intervention.  He is amb with fww.  Fww height adjusted as he maneuvers 400 ft ot pool setting without difficulty.  His fx is stabile not requiring surgical intervention. Has knee immobilizer that he is wearing when out of home. He did drive himself here today. He is now WBAT.  Reports climbing multiple steps daily to bed and bath.  Pt edu on proper technique for stair climbing using crutches. Objective testing demonstrates left knee ROM limited in both flex and extension       OBJECTIVE IMPAIRMENTS: Abnormal gait, decreased activity tolerance, decreased balance, decreased coordination, decreased endurance, decreased mobility, difficulty walking, decreased ROM, decreased strength, hypomobility, increased edema, increased fascial restrictions, impaired perceived functional ability, impaired flexibility, improper body mechanics, and pain.    ACTIVITY LIMITATIONS: carrying, lifting, bending, sitting, standing, squatting, stairs, transfers, and locomotion level   PARTICIPATION LIMITATIONS: meal prep, cleaning, laundry, interpersonal relationship, driving, shopping, community activity, and yard work   PERSONAL FACTORS: PMH - chf, macular degeneration, DM, cervical CA are also affecting patient's functional outcome.    REHAB POTENTIAL: fair   CLINICAL DECISION MAKING: complicated/unstable   EVALUATION COMPLEXITY: moderate     GOALS:   SHORT TERM GOALS: Target  date: 12/10/2023     Pt will become independent with HEP in order to demonstrate synthesis of PT education.     Goal status: Met 12/30/22    2. Pt will demonstrate at least a 12.8 improvement in Oswestry Index in order to demonstrate a clinically significant change in LBP and function.   Status: see above  Goal status: In progress -01/22/24     LONG TERM GOALS: Target date: 03/06/2024        Pt  will become  independent with final HEP in order to demonstrate synthesis of PT education.    Goal status: INITIAL   2.  Pt will demonstrate at least a 25pt improvement in Oswestry Index in order to demonstrate a clinically significant change in LBP and function. Goal status: In progress 01/22/24   3.  Pt will be able to demonstrate/report ability to sit/stand/sleep for extended periods of time without pain in order to demonstrate functional improvement and tolerance to static positioning.    Goal status: In progress 02/11/24   4. Pt will be able to demonstrate/report ability to walk >25 mins without pain in order to demonstrate functional improvement and tolerance to exercise and community mobility.     Goal status: INITIAL     5. Pt to improve on LEFS by at least 9 point  (MCID)to demonstrate statistically significant Improvement in function.   Baseline: 30/80; 44/80   Goals Status: Met 01/22/24   6. Pt will improve left knee extension to 0d and flex to at least 120d   Baseline: See chart   Goal Status: In progress 01/22/24   7. Pt will amb using SPC community distances without limitation of knee pain   Baseline: Using fww x 400 ft; progressed to single crutch   Goal status:  Met 01/22/24 (pt limited by left foot pain not knee)  8. Pt will report decrease in left knee pain by at least 75% for improved toleration to functional mobility/amb  Baseline:  Goal status: Met 02/11/24 (85)  PLAN:   PLAN:   PT FREQUENCY: 1-2x/week   PT DURATION: 6 weeks    PLANNED INTERVENTIONS: Therapeutic exercises, Therapeutic activity, Neuromuscular re-education, Balance training, Gait training, Patient/Family education, Joint manipulation, Joint mobilization, Stair training, , DME instructions, Aquatic Therapy, Dry Needling, Electrical stimulation unattended, Spinal manipulation, Spinal mobilization, Cryotherapy, Moist heat, scar mobilization, Splintting, Taping, Vasopneumatic device, Traction, Ultrasound,  Ionotophoresis 4mg /ml Dexamethasone , Manual therapy.   PLAN FOR NEXT SESSION: mobilizations; weight room progression, lumbar mobility 12/24/23: aquatic: gait, balance, core and LE strengthening; stretching, pain management  Delon Aquas, PTA 01/31/24 12:32 PM [facility] [address] [place], [place], [address] Phone: [phone]   Fax:  [phone]

## 2024-02-03 ENCOUNTER — Encounter (HOSPITAL_BASED_OUTPATIENT_CLINIC_OR_DEPARTMENT_OTHER): Payer: Self-pay | Admitting: Physical Therapy

## 2024-02-03 ENCOUNTER — Ambulatory Visit (HOSPITAL_BASED_OUTPATIENT_CLINIC_OR_DEPARTMENT_OTHER): Admitting: Physical Therapy

## 2024-02-03 DIAGNOSIS — M5459 Other low back pain: Secondary | ICD-10-CM | POA: Diagnosis not present

## 2024-02-03 DIAGNOSIS — M6281 Muscle weakness (generalized): Secondary | ICD-10-CM | POA: Diagnosis not present

## 2024-02-03 DIAGNOSIS — M25562 Pain in left knee: Secondary | ICD-10-CM

## 2024-02-03 DIAGNOSIS — R262 Difficulty in walking, not elsewhere classified: Secondary | ICD-10-CM | POA: Diagnosis not present

## 2024-02-03 NOTE — Therapy (Signed)
 OUTPATIENT PHYSICAL THERAPY LOWER EXTREMITY TREATMENT  Patient Name: Andrew Townsend MRN: [id number] DOB:[birth year], 73 y.o., male Today's Date: 02/03/2024  END OF SESSION:  PT End of Session - 02/03/24 1145     Visit Number 14    Number of Visits 20    Date for PT Re-Evaluation 03/06/24    Authorization Type [employer] M[employer]    Authorization Time Period 8/5 - 10/3    Authorization - Number of Visits 20    Progress Note Due on Visit 20    PT Start Time 1145    PT Stop Time 1225    PT Time Calculation (min) 40 min    Activity Tolerance Patient tolerated treatment well    Behavior During Therapy WFL for tasks assessed/performed                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)    Medtronic   Myocardial infarction (HCC)    Stroke (HCC)    Past Surgical History:  Procedure Laterality Date   arm surgery Right    broken - pins placed but now removed   COLONOSCOPY     CORONARY ARTERY BYPASS GRAFT     in [place]   EYE SURGERY Bilateral    remove cataracts   HERNIA REPAIR     ICD IMPLANT     Medtronic   INGUINAL HERNIA REPAIR Bilateral 08/19/2023   Procedure: OPEN BILATERAL INGUINAL HERNIA REPAIR WITH MESH;  Surgeon: Rubin Calamity, MD;  Location: [facility] OR;  Service: General;  Laterality: Bilateral;   JOINT REPLACEMENT Right    shoulder in [place]   MANDIBLE SURGERY     lower jaw surgery for  placement of dentures per patient   MULTIPLE TOOTH EXTRACTIONS     all teeth pulled - dentures   TONSILLECTOMY     UPPER GI ENDOSCOPY     WISDOM TOOTH EXTRACTION     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) 12/21/2021   Piriformis syndrome of both sides 12/21/2021   Hx of shoulder surgery (left rotator cuff surgery, right rotator cuff then reverse total shoulder arthoplasty) 12/07/2021   Lumbar facet arthropathy 12/07/2021   Bilateral primary osteoarthritis of knee 12/07/2021   Sacroiliac joint pain 12/07/2021   Gastroesophageal reflux disease 10/27/2021   S/P CABG (coronary artery bypass graft) 05/26/2021   ICD (implantable cardioverter-defibrillator) in place 05/26/2021   COPD (chronic obstructive pulmonary disease) (HCC) 05/26/2021   Cluneal  neuropathy 05/26/2021   Insomnia 05/26/2021   Atherosclerotic heart disease of native coronary artery with unspecified angina pectoris (HCC) 05/26/2021   BPH (benign prostatic hyperplasia) 03/25/2021   Stage 3b chronic kidney disease (HCC) 06/16/2018   Hypertension 04/03/2018   Hyperlipidemia 04/03/2018   Chronic pain 04/03/2018   Chronic systolic congestive heart failure (HCC) 04/03/2018   Gout 04/03/2018   Renal artery stenosis (HCC) 04/03/2018   Thrombocytopenia (HCC) 04/03/2018   Anemia in chronic kidney disease 04/03/2018   Ischemic dilated cardiomyopathy (HCC) 06/29/2016   VT (ventricular tachycardia) (HCC) 06/28/2016    PCP:  Chandra Toribio POUR, MD     REFERRING PROVIDER:  Chandra Toribio POUR, MD  12/24/23 LLE : Dr Elspeth Parker   REFERRING DIAG:  M54.16 (ICD-10-CM) - Lumbar radiculopathy  8/5/25Closed fx of left tibeal plateau    Rationale for Evaluation and Treatment: Rehabilitation  THERAPY DIAG:  Acute pain of left knee  Muscle weakness (generalized)  Other low back pain  Difficulty in walking, not elsewhere classified  ONSET DATE: >10 yrs  SUBJECTIVE:                                                                                                                                                                                           SUBJECTIVE STATEMENT: Pt reports the back is about the same. The knee still does not tolerate walking long distances over the weekend. Pt reports foot pain is present and knee feels achey.     Eval: Pt reports LBP and R buttocks pain. Injections into the back and piriformis are hit or miss in terms of relief. They are too expensive. Pt does have a [employer] membership. Pt has been dealing with this condition for quite some time with multiple episodes of PT and chiro. Pain will down into the posterior and will be just above the knee and will start having R calf cramps. Pt is unable to walk a mile like he used to , he is only down to .25 miles now with shooting. Pt is limited to 15 mins of sitting before pain starts. Denies cancer red flags.   PERTINENT HISTORY:  CHF, CAD, COPD, HTN, CKD, recent hernia repair, R TSA,   PAIN:  Are you having pain? Yes: NPRS scale: 6-7/10 consistently Pain location: R QL, middle lower back, sacral/glute on R> L Pain description: sharp Aggravating factors: extension, sitting for too long,   Relieving factors: chiro, resting, meds, movement, lumbar support    Are you having pain? Yes: NPRS scale: current 2/10 Pain location: Left knee Pain description: achy Aggravating factors: movement and weight bearing Relieving factors: resting ,  meds, ice  PRECAUTIONS: ICD/Pacemaker last shocked  registered on Oct 14, 2023  RED FLAGS: None   WEIGHT BEARING RESTRICTIONS: No WBAT 12/16/23  FALLS:  Has patient fallen in last 6 months? Yes. Number of falls 3, Off ladder; off bed; in parking garage  LIVING ENVIRONMENT: Lives with: lives with their family, sister Lives in: House/apartment Stairs: to enter Has following equipment at home: has walking stick and walker available at home, does not use   OCCUPATION: retired   PLOF: Independent with basic ADLs  PATIENT GOALS: relieve back pain; wants to hike, wants to build out his mini camper to travel   12/24/23: heal to finish his mini camper and travel  OBJECTIVE:  Note: Objective measures were completed at Evaluation unless otherwise noted.  DIAGNOSTIC FINDINGS:  CT left knee from 11/09/2023 Lateral tibial plateau fracture with evidence of some impaction but no significantly displaced fragments.  Chondrocalcinosis noted within the medial and lateral joint spaces.    Moderate atherosclerotic calcifications within the abdominal aorta. A vascular stent overlies the L1-2 disc level.   IMPRESSION: 1. Moderate to severe levocurvature centered at L2. 2. Severe multilevel degenerative disc and endplate changes. 3. Mild retrolisthesis of L1 on L2.  PATIENT SURVEYS:  ODI: ODI Score = 21 points (42%) 9/3 = 23 Interpretation: 41% to 60% (severe disability): Pain is a primary problem for these patients, but they may also be experiencing significant problems in travel, personal care, social life, sexual activity and sleep. A detailed evaluation is appropriate.  12/24/23 LEFS: 30/80 9/3 = 44/80  COGNITION:   Overall cognitive status: WFL              SENSATION: NT into R thigh, neuropathy into distal LE      L knee in knee immobilizer  12/24/23: numbness and tingling left ankle and foot   LOWER EXTREMITY ROM:   Active ROM Right eval Left eval Left  01/22/24  Hip flexion       Hip extension       Hip abduction       Hip  adduction       Hip internal rotation       Hip external rotation       Knee flexion   100 120d  Knee extension   -15 -9 active  Ankle dorsiflexion   Limited by edema  15  Ankle plantarflexion   Limited by edema 40  Ankle inversion      Ankle eversion        (Blank rows = not tested)     FUNCTIONAL TESTS: STS requires UE assist; L knee locked in knee immobilizer and NWB throughout transfers       GAIT: Bilat axillary crutches and RW with SBA and gait belt  Stairs: step to pattern with single crutch and R sided hand rail   Observation: scoliotic curves noted in lumbar and thoracic spine   12/24/23: Amb with RW (adjusted to height) x400 ft.  Cadence slowed gait antalgic. Using knee immobilizer      [facility] Adult PT Treatment:                                                DATE:  9/15  Calf stretch with quad set 3s 2x10 Knee flexion gapping 2x10 2s LAQ GTB 3x10 GTB HS curl 3x10 Bridge with  blue band 3x10 2 lateral step down 3x8 Standing HR 2x20 DL    0/87/74 Pt seen for aquatic therapy today.  Treatment took place in water 3.5-4.75 ft in depth at the [facility] pool. Temp of water was 91.  Pt entered/exited the pool via stairs with heavy ue support then exits via lift   *Unsupported walking forward/ backward with cues for heel/toe and toe/heel pattern * unsupported side stepping  * UE On wall:  heel/toe raises x 10; hip abdct/add x 10 each *forward walking with small knee kick * L stretch at pool wall  * return to walking forward/backward with UE on yellow hand floats (reports reduced tension in LB) * side stepping with arm add/abdct with rainbow hand floats *Lt forward step ups/ Rt retro step down  x10; (x 5 with RLE)  *Lt lateral step ups x 10 * staggered stance with solid noodle pull down to thighs for core engagement * UE on yellow hand floats: tandem gait forward/ backward *L stretch / trunk ext stretch * plank position with hands on bench in water and  alternating hip ext * applied sensitive skin rock tape to Lt ankle (2 stirrups, one crossing at talus); pt reminded of safe tape removal    [facility] Adult PT Treatment:                                                DATE: 01/29/24 Pt seen for aquatic therapy today.  Treatment took place in water 3.5-4.75 ft in depth at the [facility] pool. Temp of water was 91.  Pt entered/exited the pool via stairs with heavy ue support then exits via lift    *Unsupported walking forward, backward and side stepping in 3.6 -4.0 - multiple laps of each *step ups onto bottom step leading R/L 2 x 5.  Cues for TKE left with rising. (Some discomfort initially but reduces with reps) *lateral step ups left x 8 *alternating step downs 2 x 5 (no pain) *L stretch *forward walking with small knee kick *Solid noodle pull down for TrA engagement wide stance then staggered x 10   12/24/23 Tibial plateau fx left assessment Exercises - Seated Quad Set  - 3-5 x daily - 7 x weekly - 1 sets - 5-10 reps - Long Sitting Quad Set  - 3-5 x daily - 7 x weekly - 1 sets - 5-10 reps - Supine Hip Abduction  - 3-5 x daily - 7 x weekly - 3-5 sets - 5-10 reps - Straight Leg Raise     6/26  Gait training: AD sizing, maintaining WB precautions, stair sequencing and safety; use of RW for level surfaces inside home and crutches for stair navigation   6/16  UPA L2-5 bilat grade III STM bilat paraspinal and QL R LE LAD with lumbar bias 5 min LAD with PPT   6/12 UPA L2-5 bilat grade III STM bilat paraspinal and QL R LE LAD with lumbar bias 5 min LAD with PPT   LTR 20x R lateral shift correction 2x10 at wall Lumbar flexion seated with L bias 2x10 Postural changes during the day, neck/shoulder/hip positioning  10/29/23  Exercises - Seated Sciatic Nerve Glide  - 2 x daily - 7 x weekly - 2 sets - 10 reps - 3 hold - Seated Quadratus Lumborum Stretch in Chair  - 2 x  daily - 7 x weekly - 1 sets - 3 reps - 30 hold - Sit to  Stand with Resistance Around Legs  - 1 x daily - 7 x weekly - 2 sets - 10 reps   PATIENT EDUCATION:    Education details: exercise rationale /progression/ modification, DOMS expectations, muscle firing Person educated: Patient Education method: Programmer, multimedia, Demonstration, Verbal cues,  Education comprehension: verbalized understanding, returned demonstration, and verbal cues required   HOME EXERCISE PROGRAM:    Access Code: [email or web address] URL: [email or web address] Date: 10/29/2023 Prepared by: Dale Call  Aquatic TBA   Access Code: [facility] URL: [email or web address] Date: 12/24/2023 Prepared by: Matilda Kohut   Exercises - Seated Quad Set  - 3-5 x daily - 7 x weekly - 1 sets - 5-10 reps - Long Sitting Quad Set  - 3-5 x daily - 7 x weekly - 1 sets - 5-10 reps - Supine Hip Abduction  - 3-5 x daily - 7 x weekly - 3-5 sets - 5-10 reps - Straight Leg Raise  - 3-5 x daily - 7 x weekly - 3-5 sets - 5-10 reps     ASSESSMENT:   CLINICAL IMPRESSION: Pt session focused on progressing land based L LE strength at the knee and hip. Pt still lacking full TKE and end range flexion which may be related to continued edema. Pt edu provided on swelling management strategies.HEP updated with resistance exercise for the L knee. Plan to continue with land based strengthening progression as pt begins to transition out of aquatic environment. No pain noted with exercise.   PN: pt using SPC when out of home only. He reports left foot is limiting his amb > than LB or left knee.  He has been using frozen water bottle which has provided releif for ~ 1 hour after completed to plantar aspect left foot. He reports left knee pain has improved by 85% since onset. LBP has had no measureable change. ODI demonstrates a small increase in disability likely due to addition of tibeal plateau fx and use of crutch     Initial impression (tib plat fx) Patient is a 73 y.o. m who was seen today for  physical therapy evaluation and treatment for Closed fracture of lateral portion of left tibial plateau. He is WBAT as per Md as fx is stable. He has been being seen also for LBP which is now on hold due to new fx.  He presents today for aquatic therapy intervention.  He is amb with fww.  Fww height adjusted as he maneuvers 400 ft ot pool setting without difficulty.  His fx is stabile not requiring surgical intervention. Has knee immobilizer that he is wearing when out of home. He did drive himself here today. He is now WBAT.  Reports climbing multiple steps daily to bed and bath.  Pt edu on proper technique for stair climbing using crutches. Objective testing demonstrates left knee ROM limited in both flex and extension       OBJECTIVE IMPAIRMENTS: Abnormal gait, decreased activity tolerance, decreased balance, decreased coordination, decreased endurance, decreased mobility, difficulty walking, decreased ROM, decreased strength, hypomobility, increased edema, increased fascial restrictions, impaired perceived functional ability, impaired flexibility, improper body mechanics, and pain.    ACTIVITY LIMITATIONS: carrying, lifting, bending, sitting, standing, squatting, stairs, transfers, and locomotion level   PARTICIPATION LIMITATIONS: meal prep, cleaning, laundry, interpersonal relationship, driving, shopping, community activity, and yard work   PERSONAL FACTORS: PMH - chf, macular degeneration, DM, cervical CA are also affecting patient's functional outcome.  REHAB POTENTIAL: fair   CLINICAL DECISION MAKING: complicated/unstable   EVALUATION COMPLEXITY: moderate     GOALS:   SHORT TERM GOALS: Target date: 12/10/2023     Pt will become independent with HEP in order to demonstrate synthesis of PT education.     Goal status: Met 12/30/22    2. Pt will demonstrate at least a 12.8 improvement in Oswestry Index in order to demonstrate a clinically significant change in LBP and function.    Status: see above  Goal status: In progress -01/22/24     LONG TERM GOALS: Target date: 03/06/2024        Pt  will become independent with final HEP in order to demonstrate synthesis of PT education.    Goal status: INITIAL   2.  Pt will demonstrate at least a 25pt improvement in Oswestry Index in order to demonstrate a clinically significant change in LBP and function. Goal status: In progress 01/22/24   3.  Pt will be able to demonstrate/report ability to sit/stand/sleep for extended periods of time without pain in order to demonstrate functional improvement and tolerance to static positioning.    Goal status: In progress 02/11/24   4. Pt will be able to demonstrate/report ability to walk >25 mins without pain in order to demonstrate functional improvement and tolerance to exercise and community mobility.     Goal status: INITIAL     5. Pt to improve on LEFS by at least 9 point  (MCID)to demonstrate statistically significant Improvement in function.   Baseline: 30/80; 44/80   Goals Status: Met 01/22/24   6. Pt will improve left knee extension to 0d and flex to at least 120d   Baseline: See chart   Goal Status: In progress 01/22/24   7. Pt will amb using SPC community distances without limitation of knee pain   Baseline: Using fww x 400 ft; progressed to single crutch   Goal status:  Met 01/22/24 (pt limited by left foot pain not knee)  8. Pt will report decrease in left knee pain by at least 75% for improved toleration to functional mobility/amb  Baseline:  Goal status: Met 02/11/24 (85)  PLAN:   PLAN:   PT FREQUENCY: 1-2x/week   PT DURATION: 6 weeks    PLANNED INTERVENTIONS: Therapeutic exercises, Therapeutic activity, Neuromuscular re-education, Balance training, Gait training, Patient/Family education, Joint manipulation, Joint mobilization, Stair training, , DME instructions, Aquatic Therapy, Dry Needling, Electrical stimulation unattended, Spinal manipulation, Spinal  mobilization, Cryotherapy, Moist heat, scar mobilization, Splintting, Taping, Vasopneumatic device, Traction, Ultrasound, Ionotophoresis 4mg /ml Dexamethasone , Manual therapy.   PLAN FOR NEXT SESSION: mobilizations; weight room progression, lumbar mobility 12/24/23: aquatic: gait, balance, core and LE strengthening; stretching, pain management   9/15 Pt needs to schedule more appts   Dale Call PT, DPT 02/03/24 12:56 PM

## 2024-02-05 ENCOUNTER — Ambulatory Visit (HOSPITAL_BASED_OUTPATIENT_CLINIC_OR_DEPARTMENT_OTHER): Admitting: Physical Therapy

## 2024-02-05 ENCOUNTER — Encounter (HOSPITAL_BASED_OUTPATIENT_CLINIC_OR_DEPARTMENT_OTHER): Payer: Self-pay | Admitting: Physical Therapy

## 2024-02-05 DIAGNOSIS — M25562 Pain in left knee: Secondary | ICD-10-CM

## 2024-02-05 DIAGNOSIS — M5459 Other low back pain: Secondary | ICD-10-CM | POA: Diagnosis not present

## 2024-02-05 DIAGNOSIS — R262 Difficulty in walking, not elsewhere classified: Secondary | ICD-10-CM | POA: Diagnosis not present

## 2024-02-05 DIAGNOSIS — M6281 Muscle weakness (generalized): Secondary | ICD-10-CM | POA: Diagnosis not present

## 2024-02-05 NOTE — Therapy (Signed)
 OUTPATIENT PHYSICAL THERAPY LOWER EXTREMITY TREATMENT  Patient Name: Andrew Townsend MRN: [id number] DOB:May 07, 1951, 73 y.o., male Today's Date: 02/05/2024  END OF SESSION:  PT End of Session - 02/05/24 1319     Visit Number 15    Number of Visits 20    Date for PT Re-Evaluation 03/06/24    Authorization Type [employer] MCR    Authorization Time Period 8/5 - 10/3    Authorization - Number of Visits 20    Progress Note Due on Visit 20    PT Start Time 1315    PT Stop Time 1355    PT Time Calculation (min) 40 min    Activity Tolerance Patient tolerated treatment well    Behavior During Therapy WFL for tasks assessed/performed                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)    Medtronic   Myocardial infarction (HCC)    Stroke (HCC)    Past Surgical History:  Procedure Laterality Date   arm surgery Right    broken - pins placed but now removed   COLONOSCOPY     CORONARY ARTERY BYPASS GRAFT     in [place]   EYE SURGERY Bilateral    remove cataracts   HERNIA REPAIR     ICD IMPLANT     Medtronic   INGUINAL HERNIA REPAIR Bilateral 08/19/2023   Procedure: OPEN BILATERAL INGUINAL HERNIA REPAIR WITH MESH;  Surgeon: Rubin Calamity, MD;  Location: [facility] OR;  Service: General;  Laterality: Bilateral;   JOINT REPLACEMENT Right    shoulder in [place]   MANDIBLE SURGERY     lower jaw surgery for  placement of dentures per patient   MULTIPLE TOOTH EXTRACTIONS     all teeth pulled - dentures   TONSILLECTOMY     UPPER GI ENDOSCOPY     WISDOM TOOTH EXTRACTION     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) 12/21/2021   Piriformis syndrome of both sides 12/21/2021   Hx of shoulder surgery (left rotator cuff surgery, right rotator cuff then reverse total shoulder arthoplasty) 12/07/2021   Lumbar facet arthropathy 12/07/2021   Bilateral primary osteoarthritis of knee 12/07/2021   Sacroiliac joint pain 12/07/2021   Gastroesophageal reflux disease 10/27/2021   S/P CABG (coronary artery bypass graft) 05/26/2021   ICD (implantable cardioverter-defibrillator) in place 05/26/2021   COPD (chronic obstructive pulmonary disease) (HCC) 05/26/2021   Cluneal  neuropathy 05/26/2021   Insomnia 05/26/2021   Atherosclerotic heart disease of native coronary artery with unspecified angina pectoris (HCC) 05/26/2021   BPH (benign prostatic hyperplasia) 03/25/2021   Stage 3b chronic kidney disease (HCC) 06/16/2018   Hypertension 04/03/2018   Hyperlipidemia 04/03/2018   Chronic pain 04/03/2018   Chronic systolic congestive heart failure (HCC) 04/03/2018   Gout 04/03/2018   Renal artery stenosis (HCC) 04/03/2018   Thrombocytopenia (HCC) 04/03/2018   Anemia in chronic kidney disease 04/03/2018   Ischemic dilated cardiomyopathy (HCC) 06/29/2016   VT (ventricular tachycardia) (HCC) 06/28/2016    PCP:  Chandra Toribio POUR, MD     REFERRING PROVIDER:  Chandra Toribio POUR, MD  12/24/23 LLE : Dr Elspeth Parker   REFERRING DIAG:  M54.16 (ICD-10-CM) - Lumbar radiculopathy  8/5/25Closed fx of left tibeal plateau    Rationale for Evaluation and Treatment: Rehabilitation  THERAPY DIAG:  Acute pain of left knee  Muscle weakness (generalized)  Other low back pain  Difficulty in walking, not elsewhere classified  ONSET DATE: >10 yrs  SUBJECTIVE:                                                                                                                                                                                           SUBJECTIVE STATEMENT: Pt reports left foot pain 5/10 still numb; LBP 6/10 no reprieve.     Eval: Pt reports LBP and R buttocks pain. Injections into the back and piriformis are hit or miss in terms of relief. They are too expensive. Pt does have a [employer] membership. Pt has been dealing with this condition for quite some time with multiple episodes of PT and chiro. Pain will down into the posterior and will be just above the knee and will start having R calf cramps. Pt is unable to walk a mile like he used to , he is only down to .25 miles now with shooting. Pt is limited to 15 mins of sitting before pain starts. Denies cancer red flags.   PERTINENT HISTORY:  CHF, CAD, COPD, HTN, CKD, recent hernia repair, R TSA,   PAIN:  Are you having pain? Yes: NPRS scale: 6-7/10 consistently Pain location: R QL, middle lower back, sacral/glute on R> L Pain description: sharp Aggravating factors: extension, sitting for too long,   Relieving factors: chiro, resting, meds, movement, lumbar support    Are you having pain? Yes: NPRS scale: current 2/10 Pain location: Left knee Pain description: achy Aggravating factors: movement and weight bearing Relieving factors: resting , meds, ice  PRECAUTIONS: ICD/Pacemaker last shocked registered on Oct 14, 2023  RED FLAGS: None  WEIGHT BEARING RESTRICTIONS: No WBAT 12/16/23  FALLS:   Has patient fallen in last 6 months? Yes. Number of falls 3, Off ladder; off bed; in parking garage  LIVING ENVIRONMENT: Lives with: lives with their family, sister Lives in: House/apartment Stairs: to enter Has following equipment at home: has walking stick and walker available at home, does not use   OCCUPATION: retired   PLOF: Independent with basic ADLs  PATIENT GOALS: relieve back pain; wants to hike, wants to build out his mini camper to travel   12/24/23: heal to finish his mini camper and travel  OBJECTIVE:  Note: Objective measures were completed at Evaluation unless otherwise noted.  DIAGNOSTIC FINDINGS:  CT left knee from 11/09/2023 Lateral tibial plateau fracture with evidence of some impaction but no significantly displaced fragments.  Chondrocalcinosis noted within the medial and lateral joint spaces.    Moderate atherosclerotic calcifications within the abdominal aorta. A vascular stent overlies the L1-2 disc level.   IMPRESSION: 1. Moderate to severe levocurvature centered at L2. 2. Severe multilevel degenerative disc and endplate changes. 3. Mild retrolisthesis of L1 on L2.  PATIENT SURVEYS:  ODI: ODI Score = 21 points (42%) 9/3 = 23 Interpretation: 41% to 60% (severe disability): Pain is a primary problem for these patients, but they may also be experiencing significant problems in travel, personal care, social life, sexual activity and sleep. A detailed evaluation is appropriate.  12/24/23 LEFS: 30/80 9/3 = 44/80  COGNITION:   Overall cognitive status: WFL              SENSATION: NT into R thigh, neuropathy into distal LE      L knee in knee immobilizer  12/24/23: numbness and tingling left ankle and foot   LOWER EXTREMITY ROM:   Active ROM Right eval Left eval Left  01/22/24  Hip flexion       Hip extension       Hip abduction       Hip adduction       Hip internal rotation       Hip external rotation       Knee flexion   100 120d  Knee  extension   -15 -9 active  Ankle dorsiflexion   Limited by edema  15  Ankle plantarflexion   Limited by edema 40  Ankle inversion      Ankle eversion        (Blank rows = not tested)     FUNCTIONAL TESTS: STS requires UE assist; L knee locked in knee immobilizer and NWB throughout transfers       GAIT: Bilat axillary crutches and RW with SBA and gait belt  Stairs: step to pattern with single crutch and R sided hand rail   Observation: scoliotic curves noted in lumbar and thoracic spine   12/24/23: Amb with RW (adjusted to height) x400 ft.  Cadence slowed gait antalgic. Using knee immobilizer      [facility] Adult PT Treatment:                                                DATE:  01/31/24 Pt seen for aquatic therapy today.  Treatment took place in water 3.5-4.75 ft in depth at the [facility] pool. Temp of water was 91.  Pt entered/exited the pool via stairs with heavy ue support then exits via  lift   *Unsupported walking forward/ backward with cues for heel/toe and toe/heel pattern * unsupported side stepping  *forward march with kick  * UE support yellow HB:  heel/toe raises x 15 *toe walking forward then back *heel walking forward then back *HB carry RBHB forward and back unilaterally then bilaterally 3.14ft * L stretch at pool wall / trunk ext stretch 3.6 ft * UE on yellow hand floats: tandem gait forward/ backward 3.6 ft * plank position with hands on bench in water and alternating hip ext *seated: LAQ *forward step up leading R/L 2 x 5 * staggered then wide stance with solid black noodle pull down to thighs for core engagement x 5-10 * return to walking forward/backward with UE unsupported    9/15  Calf stretch with quad set 3s 2x10 Knee flexion gapping 2x10 2s LAQ GTB 3x10 GTB HS curl 3x10 Bridge with blue band 3x10 2 lateral step down 3x8 Standing HR 2x20 DL     [facility] Adult PT Treatment:                                                DATE: 01/29/24 Pt  seen for aquatic therapy today.  Treatment took place in water 3.5-4.75 ft in depth at the [facility] pool. Temp of water was 91.  Pt entered/exited the pool via stairs with heavy ue support then exits via lift    *Unsupported walking forward, backward and side stepping in 3.6 -4.0 - multiple laps of each *step ups onto bottom step leading R/L 2 x 5.  Cues for TKE left with rising. (Some discomfort initially but reduces with reps) *lateral step ups left x 8 *alternating step downs 2 x 5 (no pain) *L stretch *forward walking with small knee kick *Solid noodle pull down for TrA engagement wide stance then staggered x 10     PATIENT EDUCATION:    Education details: exercise rationale /progression/ modification, DOMS expectations, muscle firing Person educated: Patient Education method: Programmer, multimedia, Demonstration, Verbal cues,  Education comprehension: verbalized understanding, returned demonstration, and verbal cues required   HOME EXERCISE PROGRAM:    Access Code: [email or web address] URL: [email or web address] Date: 10/29/2023 Prepared by: Dale Call  Aquatic TBA   Access Code: [facility] URL: [email or web address] Date: 12/24/2023 Prepared by: Matilda Kohut   Exercises - Seated Quad Set  - 3-5 x daily - 7 x weekly - 1 sets - 5-10 reps - Long Sitting Quad Set  - 3-5 x daily - 7 x weekly - 1 sets - 5-10 reps - Supine Hip Abduction  - 3-5 x daily - 7 x weekly - 3-5 sets - 5-10 reps - Straight Leg Raise  - 3-5 x daily - 7 x weekly - 3-5 sets - 5-10 reps     ASSESSMENT:   CLINICAL IMPRESSION: Reduced submersion for added load with exercises not tolerated well due to increasing LB discomfort. Seated rest period resolves some. Returned to deeper submersion with improved toleration. He tolerates several exercises today with a few rest periods. Goals lngoing    PN: pt using SPC when out of home only. He reports left foot is limiting his amb > than LB or  left knee.  He has been using frozen water bottle which has provided releif for ~ 1 hour after completed to plantar aspect left foot. He reports left knee pain has improved  by 85% since onset. LBP has had no measureable change. ODI demonstrates a small increase in disability likely due to addition of tibeal plateau fx and use of crutch     Initial impression (tib plat fx) Patient is a 73 y.o. m who was seen today for physical therapy evaluation and treatment for Closed fracture of lateral portion of left tibial plateau. He is WBAT as per Md as fx is stable. He has been being seen also for LBP which is now on hold due to new fx.  He presents today for aquatic therapy intervention.  He is amb with fww.  Fww height adjusted as he maneuvers 400 ft ot pool setting without difficulty.  His fx is stabile not requiring surgical intervention. Has knee immobilizer that he is wearing when out of home. He did drive himself here today. He is now WBAT.  Reports climbing multiple steps daily to bed and bath.  Pt edu on proper technique for stair climbing using crutches. Objective testing demonstrates left knee ROM limited in both flex and extension       OBJECTIVE IMPAIRMENTS: Abnormal gait, decreased activity tolerance, decreased balance, decreased coordination, decreased endurance, decreased mobility, difficulty walking, decreased ROM, decreased strength, hypomobility, increased edema, increased fascial restrictions, impaired perceived functional ability, impaired flexibility, improper body mechanics, and pain.    ACTIVITY LIMITATIONS: carrying, lifting, bending, sitting, standing, squatting, stairs, transfers, and locomotion level   PARTICIPATION LIMITATIONS: meal prep, cleaning, laundry, interpersonal relationship, driving, shopping, community activity, and yard work   PERSONAL FACTORS: PMH - chf, macular degeneration, DM, cervical CA are also affecting patient's functional outcome.    REHAB POTENTIAL: fair    CLINICAL DECISION MAKING: complicated/unstable   EVALUATION COMPLEXITY: moderate     GOALS:   SHORT TERM GOALS: Target date: 12/10/2023     Pt will become independent with HEP in order to demonstrate synthesis of PT education.     Goal status: Met 12/30/22    2. Pt will demonstrate at least a 12.8 improvement in Oswestry Index in order to demonstrate a clinically significant change in LBP and function.   Status: see above  Goal status: In progress -01/22/24     LONG TERM GOALS: Target date: 03/06/2024        Pt  will become independent with final HEP in order to demonstrate synthesis of PT education.    Goal status: INITIAL   2.  Pt will demonstrate at least a 25pt improvement in Oswestry Index in order to demonstrate a clinically significant change in LBP and function. Goal status: In progress 01/22/24   3.  Pt will be able to demonstrate/report ability to sit/stand/sleep for extended periods of time without pain in order to demonstrate functional improvement and tolerance to static positioning.    Goal status: In progress 02/11/24   4. Pt will be able to demonstrate/report ability to walk >25 mins without pain in order to demonstrate functional improvement and tolerance to exercise and community mobility.     Goal status: INITIAL     5. Pt to improve on LEFS by at least 9 point  (MCID)to demonstrate statistically significant Improvement in function.   Baseline: 30/80; 44/80   Goals Status: Met 01/22/24   6. Pt will improve left knee extension to 0d and flex to at least 120d   Baseline: See chart   Goal Status: In progress 01/22/24   7. Pt will amb using SPC community distances without limitation of knee pain   Baseline: Using  fww x 400 ft; progressed to single crutch   Goal status:  Met 01/22/24 (pt limited by left foot pain not knee)  8. Pt will report decrease in left knee pain by at least 75% for improved toleration to functional mobility/amb  Baseline:  Goal status:  Met 02/11/24 (85)  PLAN:   PLAN:   PT FREQUENCY: 1-2x/week   PT DURATION: 6 weeks    PLANNED INTERVENTIONS: Therapeutic exercises, Therapeutic activity, Neuromuscular re-education, Balance training, Gait training, Patient/Family education, Joint manipulation, Joint mobilization, Stair training, , DME instructions, Aquatic Therapy, Dry Needling, Electrical stimulation unattended, Spinal manipulation, Spinal mobilization, Cryotherapy, Moist heat, scar mobilization, Splintting, Taping, Vasopneumatic device, Traction, Ultrasound, Ionotophoresis 4mg /ml Dexamethasone , Manual therapy.   PLAN FOR NEXT SESSION: mobilizations; weight room progression, lumbar mobility 12/24/23: aquatic: gait, balance, core and LE strengthening; stretching, pain management   9/15 Pt needs to schedule more appts   Ronal Kem) Nathaniel Yaden MPT 02/05/24 1:21 PM [facility] [address] [place], [place], [address] Phone: [phone]   Fax:  [phone]

## 2024-02-06 NOTE — Progress Notes (Addendum)
 Device order's placed on chart and the medtronic rep's have been notified of upcoming surgery.

## 2024-02-06 NOTE — Progress Notes (Signed)
 Surgical Instructions   Your procedure is scheduled on Wednesday, September 24th. Report to [facility] Main Entrance A at 6:30 A.M., then check in with the Admitting office. Any questions or running late day of surgery: call [phone]  Questions prior to your surgery date: call [phone], Monday-Friday, 8am-4pm. If you experience any cold or flu symptoms such as cough, fever, chills, shortness of breath, etc. between now and your scheduled surgery, please notify us  at the above number.     Remember:  Do not eat after midnight the night before your surgery   You may drink clear liquids until 5:30 the morning of your surgery.   Clear liquids allowed are: Water, Non-Citrus Juices (without pulp), Carbonated Beverages, Clear Tea (no milk, honey, etc.), Black Coffee Only (NO MILK, CREAM OR POWDERED CREAMER of any kind), and Gatorade.    Take these medicines the morning of surgery with A SIP OF WATER  allopurinol  (ZYLOPRIM )  ezetimibe  (ZETIA )  Fluticasone-Umeclidin-Vilant (TRELEGY ELLIPTA ) inhaler  metoprolol  succinate (TOPROL -XL)  omeprazole  (PRILOSEC)    May take these medicines IF NEEDED: albuterol  (PROVENTIL ) nebulizer solution  albuterol  (VENTOLIN  HFA) inhaler - bring with you on day of surgery  nitroGLYCERIN  (NITROSTAT ) - call [phone] after taking this medication  oxyCODONE -acetaminophen  (PERCOCET)  tiZANidine  (ZANAFLEX )    Follow your physician's instruction and HOLD your Eliquis  for 2 day's prior to surgery.  Last dose should be on Sunday, September 21st.     One week prior to surgery, STOP taking any Aspirin (unless otherwise instructed by your surgeon) Aleve, Naproxen, Ibuprofen, Motrin, Advil, Goody's, BC's, all herbal medications, fish oil, and non-prescription vitamins.  WHAT DO I DO ABOUT MY DIABETES MEDICATION?   Do not take oral diabetes medicines (pills) the morning of surgery.  HOLD your empagliflozin  (JARDIANCE ) for 72 hours prior to surgery.   Last dose on Saturday, September 20th.   The day of surgery, do not take other diabetes injectables, including Byetta (exenatide), Bydureon (exenatide ER), Victoza (liraglutide), or Trulicity (dulaglutide).  HOW TO MANAGE YOUR DIABETES BEFORE AND AFTER SURGERY  Why is it important to control my blood sugar before and after surgery? Improving blood sugar levels before and after surgery helps healing and can limit problems. A way of improving blood sugar control is eating a healthy diet by:  Eating less sugar and carbohydrates  Increasing activity/exercise  Talking with your doctor about reaching your blood sugar goals High blood sugars (greater than 180 mg/dL) can raise your risk of infections and slow your recovery, so you will need to focus on controlling your diabetes during the weeks before surgery. Make sure that the doctor who takes care of your diabetes knows about your planned surgery including the date and location.  How do I manage my blood sugar before surgery? Check your blood sugar at least 4 times a day, starting 2 days before surgery, to make sure that the level is not too high or low.  Check your blood sugar the morning of your surgery when you wake up and every 2 hours until you get to the Short Stay unit.  If your blood sugar is less than 70 mg/dL, you will need to treat for low blood sugar: Do not take insulin. Treat a low blood sugar (less than 70 mg/dL) with  cup of clear juice (cranberry or apple), 4 glucose tablets, OR glucose gel. Recheck blood sugar in 15 minutes after treatment (to make sure it is greater than 70 mg/dL). If your blood sugar is not greater  than 70 mg/dL on recheck, call [phone] for further instructions. Report your blood sugar to the short stay nurse when you get to Short Stay.  If you are admitted to the hospital after surgery: Your blood sugar will be checked by the staff and you will probably be given insulin after surgery (instead of oral  diabetes medicines) to make sure you have good blood sugar levels. The goal for blood sugar control after surgery is 80-180 mg/dL.                     Do NOT Smoke (Tobacco/Vaping) for 24 hours prior to your procedure.  If you use a CPAP at night, you may bring your mask/headgear for your overnight stay.   You will be asked to remove any contacts, glasses, piercing's, hearing aid's, dentures/partials prior to surgery. Please bring cases for these items if needed.    Patients discharged the day of surgery will not be allowed to drive home, and someone needs to stay with them for 24 hours.  SURGICAL WAITING ROOM VISITATION Patients may have no more than 2 support people in the waiting area - these visitors may rotate.   Pre-op nurse will coordinate an appropriate time for 1 ADULT support person, who may not rotate, to accompany patient in pre-op.  Children under the age of 75 must have an adult with them who is not the patient and must remain in the main waiting area with an adult.  If the patient needs to stay at the hospital during part of their recovery, the visitor guidelines for inpatient rooms apply.  Please refer to the [facility] website for the visitor guidelines for any additional information.   If you received a COVID test during your pre-op visit  it is requested that you wear a mask when out in public, stay away from anyone that may not be feeling well and notify your surgeon if you develop symptoms. If you have been in contact with anyone that has tested positive in the last 10 days please notify you surgeon.      Pre-operative CHG Bathing Instructions   You can play a key role in reducing the risk of infection after surgery. Your skin needs to be as free of germs as possible. You can reduce the number of germs on your skin by washing with CHG (chlorhexidine  gluconate) soap before surgery. CHG is an antiseptic soap that kills germs and continues to kill germs even after  washing.   DO NOT use if you have an allergy to chlorhexidine /CHG or antibacterial soaps. If your skin becomes reddened or irritated, stop using the CHG and notify one of our RNs at [phone].              TAKE A SHOWER THE NIGHT BEFORE SURGERY AND THE DAY OF SURGERY    Please keep in mind the following:  DO NOT shave, including legs and underarms, 48 hours prior to surgery.   You may shave your face before/day of surgery.  Place clean sheets on your bed the night before surgery Use a clean washcloth (not used since being washed) for each shower. DO NOT sleep with pet's night before surgery.  CHG Shower Instructions:  Wash your face and private area with normal soap. If you choose to wash your hair, wash first with your normal shampoo.  After you use shampoo/soap, rinse your hair and body thoroughly to remove shampoo/soap residue.  Turn the water OFF and apply half  the bottle of CHG soap to a CLEAN washcloth.  Apply CHG soap ONLY FROM YOUR NECK DOWN TO YOUR TOES (washing for 3-5 minutes)  DO NOT use CHG soap on face, private areas, open wounds, or sores.  Pay special attention to the area where your surgery is being performed.  If you are having back surgery, having someone wash your back for you may be helpful. Wait 2 minutes after CHG soap is applied, then you may rinse off the CHG soap.  Pat dry with a clean towel  Put on clean pajamas    Additional instructions for the day of surgery: DO NOT APPLY any lotions, deodorants, cologne, or perfumes.   Do not wear jewelry or makeup Do not wear nail polish, gel polish, artificial nails, or any other type of covering on natural nails (fingers and toes) Do not bring valuables to the hospital. [facility] is not responsible for valuables/personal belongings. Put on clean/comfortable clothes.  Please brush your teeth.  Ask your nurse before applying any prescription medications to the skin.

## 2024-02-07 ENCOUNTER — Encounter (HOSPITAL_COMMUNITY): Payer: Self-pay

## 2024-02-07 ENCOUNTER — Other Ambulatory Visit: Payer: Self-pay

## 2024-02-07 ENCOUNTER — Encounter (HOSPITAL_COMMUNITY)
Admission: RE | Admit: 2024-02-07 | Discharge: 2024-02-07 | Disposition: A | Discharge status: Home / Self Care | Source: Ambulatory Visit | Attending: General Surgery | Admitting: General Surgery

## 2024-02-07 VITALS — BP 154/69 | HR 58 | Temp 98.3°F | Resp 17 | Ht 66.0 in | Wt 123.9 lb

## 2024-02-07 DIAGNOSIS — E785 Hyperlipidemia, unspecified: Secondary | ICD-10-CM | POA: Diagnosis not present

## 2024-02-07 DIAGNOSIS — Z95810 Presence of automatic (implantable) cardiac defibrillator: Secondary | ICD-10-CM | POA: Diagnosis not present

## 2024-02-07 DIAGNOSIS — Z01812 Encounter for preprocedural laboratory examination: Secondary | ICD-10-CM | POA: Diagnosis not present

## 2024-02-07 DIAGNOSIS — Z7901 Long term (current) use of anticoagulants: Secondary | ICD-10-CM | POA: Insufficient documentation

## 2024-02-07 DIAGNOSIS — G8929 Other chronic pain: Secondary | ICD-10-CM | POA: Diagnosis not present

## 2024-02-07 DIAGNOSIS — I252 Old myocardial infarction: Secondary | ICD-10-CM | POA: Insufficient documentation

## 2024-02-07 DIAGNOSIS — K219 Gastro-esophageal reflux disease without esophagitis: Secondary | ICD-10-CM | POA: Diagnosis not present

## 2024-02-07 DIAGNOSIS — I2510 Atherosclerotic heart disease of native coronary artery without angina pectoris: Secondary | ICD-10-CM | POA: Insufficient documentation

## 2024-02-07 DIAGNOSIS — D696 Thrombocytopenia, unspecified: Secondary | ICD-10-CM | POA: Diagnosis not present

## 2024-02-07 DIAGNOSIS — Z951 Presence of aortocoronary bypass graft: Secondary | ICD-10-CM | POA: Diagnosis not present

## 2024-02-07 DIAGNOSIS — I480 Paroxysmal atrial fibrillation: Secondary | ICD-10-CM | POA: Insufficient documentation

## 2024-02-07 DIAGNOSIS — Z8673 Personal history of transient ischemic attack (TIA), and cerebral infarction without residual deficits: Secondary | ICD-10-CM | POA: Insufficient documentation

## 2024-02-07 DIAGNOSIS — Z01818 Encounter for other preprocedural examination: Secondary | ICD-10-CM

## 2024-02-07 DIAGNOSIS — N400 Enlarged prostate without lower urinary tract symptoms: Secondary | ICD-10-CM | POA: Diagnosis not present

## 2024-02-07 DIAGNOSIS — I255 Ischemic cardiomyopathy: Secondary | ICD-10-CM | POA: Insufficient documentation

## 2024-02-07 DIAGNOSIS — I129 Hypertensive chronic kidney disease with stage 1 through stage 4 chronic kidney disease, or unspecified chronic kidney disease: Secondary | ICD-10-CM | POA: Diagnosis not present

## 2024-02-07 DIAGNOSIS — N183 Chronic kidney disease, stage 3 unspecified: Secondary | ICD-10-CM | POA: Diagnosis not present

## 2024-02-07 DIAGNOSIS — J449 Chronic obstructive pulmonary disease, unspecified: Secondary | ICD-10-CM | POA: Insufficient documentation

## 2024-02-07 DIAGNOSIS — Z87891 Personal history of nicotine dependence: Secondary | ICD-10-CM | POA: Diagnosis not present

## 2024-02-07 DIAGNOSIS — G629 Polyneuropathy, unspecified: Secondary | ICD-10-CM | POA: Insufficient documentation

## 2024-02-07 LAB — BASIC METABOLIC PANEL WITH GFR
Anion gap: 7 (ref 5–15)
BUN: 20 mg/dL (ref 8–23)
CO2: 24 mmol/L (ref 22–32)
Calcium: 9.1 mg/dL (ref 8.9–10.3)
Chloride: 108 mmol/L (ref 98–111)
Creatinine, Ser: 1.42 mg/dL — ABNORMAL HIGH (ref 0.61–1.24)
GFR, Estimated: 52 mL/min — ABNORMAL LOW (ref >60)
Glucose, Bld: 58 mg/dL — ABNORMAL LOW (ref 70–99)
Potassium: 4.6 mmol/L (ref 3.5–5.1)
Sodium: 139 mmol/L (ref 135–145)

## 2024-02-07 LAB — CBC
HCT: 46.6 % (ref 39.0–52.0)
Hemoglobin: 14.5 g/dL (ref 13.0–17.0)
MCH: 29.7 pg (ref 26.0–34.0)
MCHC: 31.1 g/dL (ref 30.0–36.0)
MCV: 95.5 fL (ref 80.0–100.0)
Platelets: 181 K/uL (ref 150–400)
RBC: 4.88 MIL/uL (ref 4.22–5.81)
RDW: 13.5 % (ref 11.5–15.5)
WBC: 7.9 K/uL (ref 4.0–10.5)
nRBC: 0 % (ref 0.0–0.2)

## 2024-02-07 NOTE — Progress Notes (Signed)
 PCP - Toribio Slain Cardiologist - Dr. Cathlyn Birmingham  PPM/ICD - Med[employer] ICD Device Orders - yes, placed in chart Rep Notified - Donley James and Leontine Pizza have been notified via email  Chest x-ray - n/a EKG - 12-05-23 Stress Test - Denies ECHO - 08-24-22 Cardiac Cath - 08-05-20 CE  Sleep Study - Denies CPAP - n/a  Fasting Blood Sugar - Denies Checks Blood Sugar _____ times a day - denies  Last dose of GLP1 agonist-  denies GLP1 instructions: n/a  Blood Thinner Instructions: Eliquis  - per patient per physician hold for 2 days prior.  Last dose on Sunday 9-21 Aspirin Instructions: Denies  ERAS Protcol - Clears until 0530 PRE-SURGERY Ensure or G2- n/a  COVID TEST- n/a   Anesthesia review: Yes, HTN, MI, CKD, COPD, Stroke, ICD - medtronic  Patient denies shortness of breath, fever, cough and chest pain at PAT appointment. Patient denies any respiratory issues at this time.    All instructions explained to the patient, with a verbal understanding of the material. Patient agrees to go over the instructions while at home for a better understanding. Patient also instructed to self quarantine after being tested for COVID-19. The opportunity to ask questions was provided.

## 2024-02-10 ENCOUNTER — Ambulatory Visit (HOSPITAL_BASED_OUTPATIENT_CLINIC_OR_DEPARTMENT_OTHER)

## 2024-02-10 ENCOUNTER — Encounter: Payer: Self-pay | Admitting: Sports Medicine

## 2024-02-10 ENCOUNTER — Ambulatory Visit: Admitting: Sports Medicine

## 2024-02-10 ENCOUNTER — Encounter (HOSPITAL_BASED_OUTPATIENT_CLINIC_OR_DEPARTMENT_OTHER): Payer: Self-pay

## 2024-02-10 DIAGNOSIS — M2142 Flat foot [pes planus] (acquired), left foot: Secondary | ICD-10-CM | POA: Diagnosis not present

## 2024-02-10 DIAGNOSIS — M76822 Posterior tibial tendinitis, left leg: Secondary | ICD-10-CM

## 2024-02-10 DIAGNOSIS — M62562 Muscle wasting and atrophy, not elsewhere classified, left lower leg: Secondary | ICD-10-CM

## 2024-02-10 DIAGNOSIS — M5459 Other low back pain: Secondary | ICD-10-CM

## 2024-02-10 DIAGNOSIS — M25562 Pain in left knee: Secondary | ICD-10-CM

## 2024-02-10 DIAGNOSIS — M2141 Flat foot [pes planus] (acquired), right foot: Secondary | ICD-10-CM | POA: Diagnosis not present

## 2024-02-10 DIAGNOSIS — M25572 Pain in left ankle and joints of left foot: Secondary | ICD-10-CM | POA: Diagnosis not present

## 2024-02-10 DIAGNOSIS — R262 Difficulty in walking, not elsewhere classified: Secondary | ICD-10-CM | POA: Diagnosis not present

## 2024-02-10 DIAGNOSIS — M6281 Muscle weakness (generalized): Secondary | ICD-10-CM

## 2024-02-10 NOTE — Therapy (Signed)
 OUTPATIENT PHYSICAL THERAPY LOWER EXTREMITY TREATMENT  Patient Name: Andrew Townsend MRN: [id number] DOB:Aug 19, 1950, 73 y.o., male Today's Date: 02/10/2024  END OF SESSION:  PT End of Session - 02/10/24 1306     Visit Number 16    Number of Visits 20    Date for Recertification  03/06/24    Authorization Type [employer] MCR    Authorization Time Period 8/5 - 10/3    Authorization - Visit Number 10    Authorization - Number of Visits 20    Progress Note Due on Visit 20    PT Start Time 1304    PT Stop Time 1345    PT Time Calculation (min) 41 min    Activity Tolerance Patient tolerated treatment well    Behavior During Therapy WFL for tasks assessed/performed                 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)    Medtronic   Myocardial infarction (HCC)    Stroke (HCC)    Past Surgical History:  Procedure Laterality Date   arm surgery Right    broken - pins placed but now removed   COLONOSCOPY     CORONARY ARTERY BYPASS GRAFT     in [place]   EYE SURGERY Bilateral    remove cataracts   HERNIA REPAIR     ICD IMPLANT     Medtronic   INGUINAL HERNIA REPAIR Bilateral 08/19/2023   Procedure: OPEN BILATERAL INGUINAL HERNIA REPAIR WITH MESH;  Surgeon: Rubin Calamity, MD;  Location: [facility] OR;  Service: General;  Laterality: Bilateral;   JOINT REPLACEMENT Right    shoulder in [place]   MANDIBLE  SURGERY     lower jaw surgery for placement of dentures per patient   MULTIPLE TOOTH EXTRACTIONS     all teeth pulled - dentures   TONSILLECTOMY     UPPER GI ENDOSCOPY     WISDOM TOOTH EXTRACTION     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) 12/21/2021   Piriformis syndrome of both sides 12/21/2021   Hx of shoulder surgery (left rotator cuff surgery, right rotator cuff then reverse total shoulder arthoplasty) 12/07/2021   Lumbar facet arthropathy 12/07/2021   Bilateral primary osteoarthritis of knee 12/07/2021   Sacroiliac joint pain 12/07/2021   Gastroesophageal reflux disease 10/27/2021   S/P CABG (coronary artery bypass graft) 05/26/2021   ICD (implantable cardioverter-defibrillator) in place 05/26/2021   COPD (  chronic obstructive pulmonary disease) (HCC) 05/26/2021   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   Insomnia 05/26/2021   Atherosclerotic heart disease of native coronary artery with unspecified angina pectoris (HCC) 05/26/2021   BPH (benign prostatic hyperplasia) 03/25/2021   Stage 3b chronic kidney disease (HCC) 06/16/2018   Hypertension 04/03/2018   Hyperlipidemia 04/03/2018   Chronic pain 04/03/2018   Chronic systolic congestive heart failure (HCC) 04/03/2018   Gout 04/03/2018   Renal artery stenosis 04/03/2018   Thrombocytopenia 04/03/2018   Anemia in chronic kidney disease 04/03/2018   Ischemic dilated cardiomyopathy (HCC) 06/29/2016   VT (ventricular tachycardia) (HCC) 06/28/2016    PCP:  Chandra Toribio POUR, MD      REFERRING PROVIDER:  Chandra Toribio POUR, MD  12/24/23 LLE : Dr Elspeth Parker   REFERRING DIAG:  M54.16 (ICD-10-CM) - Lumbar radiculopathy  8/5/25Closed fx of left tibeal plateau    Rationale for Evaluation and Treatment: Rehabilitation  THERAPY DIAG:  Acute pain of left knee  Muscle weakness (generalized)  Other low back pain  Difficulty in walking, not elsewhere classified  ONSET DATE: >10 yrs  SUBJECTIVE:                                                                                                                                                                                           SUBJECTIVE STATEMENT: Pt reports aquatic PT has been going pretty good. No changes in back pain. I want to get where I can walk a mile again.    Eval: Pt reports LBP and R buttocks pain. Injections into the back and piriformis are hit or miss in terms of relief. They are too expensive. Pt does have a [employer] membership. Pt has been dealing with this condition for quite some time with multiple episodes of PT and chiro. Pain will down into the posterior and will be just above the knee and will start having R calf cramps. Pt is unable to walk a mile like he used to , he is only down to .25 miles now with shooting. Pt is limited to 15 mins of sitting before pain starts. Denies cancer red flags.   PERTINENT HISTORY:  CHF, CAD, COPD, HTN, CKD, recent hernia repair, R TSA,   PAIN:  Are you having pain? Yes: NPRS scale: 6-7/10 consistently Pain location: R QL, middle lower back, sacral/glute on R> L Pain description: sharp Aggravating factors: extension, sitting for too long,   Relieving factors: chiro, resting, meds, movement, lumbar support    Are you having pain? Yes: NPRS scale: current 2/10 Pain location: Left knee Pain description: achy Aggravating factors: movement and weight bearing Relieving factors: resting ,  meds, ice  PRECAUTIONS: ICD/Pacemaker last shocked registered on Oct 14, 2023  RED FLAGS: None   WEIGHT BEARING RESTRICTIONS: No WBAT 12/16/23  FALLS:  Has patient fallen in last 6 months? Yes. Number of falls 3, Off ladder; off bed; in parking garage  LIVING ENVIRONMENT: Lives with: lives with their family, sister Lives in: House/apartment Stairs: to enter Has following equipment at home: has walking stick and walker available at home, does not use   OCCUPATION: retired   PLOF: Independent with basic ADLs  PATIENT GOALS: relieve back pain; wants to hike, wants to build out his mini camper to travel   12/24/23: heal to finish his mini camper and travel  OBJECTIVE:  Note: Objective measures were completed at Evaluation unless otherwise noted.  DIAGNOSTIC FINDINGS:  CT left knee from 11/09/2023 Lateral tibial plateau fracture with evidence of some impaction but no significantly displaced fragments.  Chondrocalcinosis noted within the medial and lateral joint spaces.    Moderate atherosclerotic calcifications within the abdominal aorta. A vascular stent overlies the L1-2 disc level.   IMPRESSION: 1. Moderate to severe levocurvature centered at L2. 2. Severe multilevel degenerative disc and endplate changes. 3. Mild retrolisthesis of L1 on L2.  PATIENT SURVEYS:  ODI: ODI Score = 21 points (42%) 9/3 = 23 Interpretation: 41% to 60% (severe disability): Pain is a primary problem for these patients, but they may also be experiencing significant problems in travel, personal care, social life, sexual activity and sleep. A detailed evaluation is appropriate.  12/24/23 LEFS: 30/80 9/3 = 44/80  COGNITION:   Overall cognitive status: WFL              SENSATION: NT into R thigh, neuropathy into distal LE      L knee in knee immobilizer  12/24/23: numbness and tingling left ankle and foot   LOWER EXTREMITY ROM:   Active ROM Right eval Left eval Left  01/22/24  Hip flexion       Hip extension       Hip abduction       Hip adduction        Hip internal rotation       Hip external rotation       Knee flexion   100 120d  Knee extension   -15 -9 active  Ankle dorsiflexion   Limited by edema  15  Ankle plantarflexion   Limited by edema 40  Ankle inversion      Ankle eversion        (Blank rows = not tested)     FUNCTIONAL TESTS: STS requires UE assist; L knee locked in knee immobilizer and NWB throughout transfers       GAIT: Bilat axillary crutches and RW with SBA and gait belt  Stairs: step to pattern with single crutch and R sided hand rail   Observation: scoliotic curves noted in lumbar and thoracic spine   12/24/23: Amb with RW (adjusted to height) x400 ft.  Cadence slowed gait antalgic. Using knee immobilizer      [facility] Adult PT Treatment:                                                DATE:   02/10/24 Calf stretch ins tanding 20sec x3L Partial squats at rail x15 Side stepping along rail x2laps L knee PROM Roller to  L calf, STM Supine SLR (low range) x15L Sidelying hip abduction 2x10 bil LE Standing HR 2x10 DL Instructed in self IASTM to R hip and L calf  01/31/24 Pt seen for aquatic therapy today.  Treatment took place in water 3.5-4.75 ft in depth at the [facility] pool. Temp of water was 91.  Pt entered/exited the pool via stairs with heavy ue support then exits via lift   *Unsupported walking forward/ backward with cues for heel/toe and toe/heel pattern * unsupported side stepping  *forward march with kick  * UE support yellow HB:  heel/toe raises x 15 *toe walking forward then back *heel walking forward then back *HB carry RBHB forward and back unilaterally then bilaterally 3.91ft * L stretch at pool wall / trunk ext stretch 3.6 ft * UE on yellow hand floats: tandem gait forward/ backward 3.6 ft * plank position with hands on bench in water and alternating hip ext *seated: LAQ *forward step up leading R/L 2 x 5 * staggered then wide stance with solid black noodle pull down to thighs  for core engagement x 5-10 * return to walking forward/backward with UE unsupported    9/15  Calf stretch with quad set 3s 2x10 Knee flexion gapping 2x10 2s LAQ GTB 3x10 GTB HS curl 3x10 Bridge with blue band 3x10 2 lateral step down 3x8 Standing HR 2x20 DL     [facility] Adult PT Treatment:                                                DATE: 01/29/24 Pt seen for aquatic therapy today.  Treatment took place in water 3.5-4.75 ft in depth at the [facility] pool. Temp of water was 91.  Pt entered/exited the pool via stairs with heavy ue support then exits via lift    *Unsupported walking forward, backward and side stepping in 3.6 -4.0 - multiple laps of each *step ups onto bottom step leading R/L 2 x 5.  Cues for TKE left with rising. (Some discomfort initially but reduces with reps) *lateral step ups left x 8 *alternating step downs 2 x 5 (no pain) *L stretch *forward walking with small knee kick *Solid noodle pull down for TrA engagement wide stance then staggered x 10     PATIENT EDUCATION:    Education details: exercise rationale /progression/ modification, DOMS expectations, muscle firing Person educated: Patient Education method: Programmer, multimedia, Demonstration, Verbal cues,  Education comprehension: verbalized understanding, returned demonstration, and verbal cues required   HOME EXERCISE PROGRAM:    Access Code: [email or web address] URL: [email or web address] Date: 10/29/2023 Prepared by: Dale Call  Aquatic TBA   Access Code: [facility] URL: [email or web address] Date: 12/24/2023 Prepared by: Matilda Kohut   Exercises - Seated Quad Set  - 3-5 x daily - 7 x weekly - 1 sets - 5-10 reps - Long Sitting Quad Set  - 3-5 x daily - 7 x weekly - 1 sets - 5-10 reps - Supine Hip Abduction  - 3-5 x daily - 7 x weekly - 3-5 sets - 5-10 reps - Straight Leg Raise  - 3-5 x daily - 7 x weekly - 3-5 sets - 5-10 reps     ASSESSMENT:   CLINICAL  IMPRESSION: Pt c/o calf tightness especially at end range knee flexion. Spent time on IASTM using roller to calf with improved ability for knee flexion following  this. Worked on quad and glute medius strength today with god tolerance. He did report increased fatigue/mild discomfort in R hip with this compared to L. Pt having upcoming hernia repair surgery.     PN: pt using SPC when out of home only. He reports left foot is limiting his amb > than LB or left knee.  He has been using frozen water bottle which has provided releif for ~ 1 hour after completed to plantar aspect left foot. He reports left knee pain has improved by 85% since onset. LBP has had no measureable change. ODI demonstrates a small increase in disability likely due to addition of tibeal plateau fx and use of crutch     Initial impression (tib plat fx) Patient is a 73 y.o. m who was seen today for physical therapy evaluation and treatment for Closed fracture of lateral portion of left tibial plateau. He is WBAT as per Md as fx is stable. He has been being seen also for LBP which is now on hold due to new fx.  He presents today for aquatic therapy intervention.  He is amb with fww.  Fww height adjusted as he maneuvers 400 ft ot pool setting without difficulty.  His fx is stabile not requiring surgical intervention. Has knee immobilizer that he is wearing when out of home. He did drive himself here today. He is now WBAT.  Reports climbing multiple steps daily to bed and bath.  Pt edu on proper technique for stair climbing using crutches. Objective testing demonstrates left knee ROM limited in both flex and extension       OBJECTIVE IMPAIRMENTS: Abnormal gait, decreased activity tolerance, decreased balance, decreased coordination, decreased endurance, decreased mobility, difficulty walking, decreased ROM, decreased strength, hypomobility, increased edema, increased fascial restrictions, impaired perceived functional ability, impaired  flexibility, improper body mechanics, and pain.    ACTIVITY LIMITATIONS: carrying, lifting, bending, sitting, standing, squatting, stairs, transfers, and locomotion level   PARTICIPATION LIMITATIONS: meal prep, cleaning, laundry, interpersonal relationship, driving, shopping, community activity, and yard work   PERSONAL FACTORS: PMH - chf, macular degeneration, DM, cervical CA are also affecting patient's functional outcome.    REHAB POTENTIAL: fair   CLINICAL DECISION MAKING: complicated/unstable   EVALUATION COMPLEXITY: moderate     GOALS:   SHORT TERM GOALS: Target date: 12/10/2023     Pt will become independent with HEP in order to demonstrate synthesis of PT education.     Goal status: Met 12/30/22    2. Pt will demonstrate at least a 12.8 improvement in Oswestry Index in order to demonstrate a clinically significant change in LBP and function.   Status: see above  Goal status: In progress -01/22/24     LONG TERM GOALS: Target date: 03/06/2024        Pt  will become independent with final HEP in order to demonstrate synthesis of PT education.    Goal status: INITIAL   2.  Pt will demonstrate at least a 25pt improvement in Oswestry Index in order to demonstrate a clinically significant change in LBP and function. Goal status: In progress 01/22/24   3.  Pt will be able to demonstrate/report ability to sit/stand/sleep for extended periods of time without pain in order to demonstrate functional improvement and tolerance to static positioning.    Goal status: In progress 02/11/24   4. Pt will be able to demonstrate/report ability to walk >25 mins without pain in order to demonstrate functional improvement and tolerance to exercise and community mobility.  Goal status: INITIAL     5. Pt to improve on LEFS by at least 9 point  (MCID)to demonstrate statistically significant Improvement in function.   Baseline: 30/80; 44/80   Goals Status: Met 01/22/24   6. Pt will improve  left knee extension to 0d and flex to at least 120d   Baseline: See chart   Goal Status: In progress 01/22/24   7. Pt will amb using SPC community distances without limitation of knee pain   Baseline: Using fww x 400 ft; progressed to single crutch   Goal status:  Met 01/22/24 (pt limited by left foot pain not knee)  8. Pt will report decrease in left knee pain by at least 75% for improved toleration to functional mobility/amb  Baseline:  Goal status: Met 02/11/24 (85)  PLAN:   PLAN:   PT FREQUENCY: 1-2x/week   PT DURATION: 6 weeks    PLANNED INTERVENTIONS: Therapeutic exercises, Therapeutic activity, Neuromuscular re-education, Balance training, Gait training, Patient/Family education, Joint manipulation, Joint mobilization, Stair training, , DME instructions, Aquatic Therapy, Dry Needling, Electrical stimulation unattended, Spinal manipulation, Spinal mobilization, Cryotherapy, Moist heat, scar mobilization, Splintting, Taping, Vasopneumatic device, Traction, Ultrasound, Ionotophoresis 4mg /ml Dexamethasone , Manual therapy.   PLAN FOR NEXT SESSION: mobilizations; weight room progression, lumbar mobility 12/24/23: aquatic: gait, balance, core and LE strengthening; stretching, pain management    Asberry Rodes, PTA  02/10/24 3:00 PM [facility] [address] [place], [place], [address] Phone: [phone]   Fax:  [phone]

## 2024-02-10 NOTE — Progress Notes (Signed)
 Patient says that he began having pain, numbness, and tingling in his left foot after he fractured his tibial plateau in June. He says that he did not have trouble with his left foot at all before he had that fall. He says that it is the worst in the arch of his foot, although he will feel it through the toes and into the heel as well. He has tried a frozen water bottle, and is currently in physical therapy as well. He is here today for shockwave evaluation.

## 2024-02-10 NOTE — Progress Notes (Signed)
 Andrew Townsend - 73 y.o. male MRN [id number]  Date of birth: [birth year]  Office Visit Note: Visit Date: 02/10/2024 PCP: Chandra Toribio POUR, MD Referred by: Genelle Standing, MD  Subjective: Chief Complaint  Patient presents with   Left Foot - Pain   HPI: Andrew Townsend is a pleasant 73 y.o. male who presents today for chronic left foot and ankle pain.  Andrew Townsend unfortunately suffered a tibial plateau fracture back in June 2025.  This was treated nonoperatively by Dr. Genelle.  He had been having medial sided left foot and ankle pain before this but his pain certainly worsened after this injury.  He had time off of the leg and did develop atrophy.  He notices still swelling in the left foot and ankle but this has been improved from previous months.  He has done both aquatic-based physical therapy and has transition to land therapy which he continues twice weekly.  He does have chronic low back pain and is managed on Percocet 10-325 mg every 8 hours as needed for pain.  This does help the foot to an extent but still has pain.  He has been trying a frozen water bottle on the arch of the foot.  Occasionally has tingling and pain over the plantar aspect of the foot and medial ankle.  He does have orthotics that he purchased years ago that are firm, has not worn these given the swelling since his previous injury.  Pertinent ROS were reviewed with the patient and found to be negative unless otherwise specified above in HPI.   Assessment & Plan: Visit Diagnoses:  1. Pain in left ankle and joints of left foot   2. Atrophy of muscle of left lower leg   3. Posterior tibial tendon dysfunction (PTTD) of left lower extremity   4. Pes planus of both feet    Plan: Impression is acute on chronic left foot and ankle pain which emanates mostly within the posterior tibial tendon as well as the surrounding plantar fascia and medial arch of the foot.  I do believe this worsened since his injury as he does have atrophy of the  surrounding calf musculature and did develop a functional gait abnormality.  He also has flatfeet and has not been wearing his orthotics given the swelling in his legs from his previous fracture.  Through shared decision making, we did proceed with extracorporeal shockwave therapy for the plantar arch, medial ankle and the course of the posterior tibial tendon.  I would like to see how he responds to this after a few treatments, he will see us  back over the next 1-2 weeks for repeat treatment and further evaluation.  May continue his chronic Percocet 10-325 mg every 8 hours as needed in the interim.  I did want him to add some ankle and calf exercises to his physical therapy, referral sent today.  I would like him to bring his orthotics at his next visit to evaluate how this supports his longitudinal arch and the medial ankle at future follow-ups.  Follow-up: Return in about 2 weeks (around 02/24/2024) for Left foot (bring orthotics to visit).   Meds & Orders: No orders of the defined types were placed in this encounter.   Orders Placed This Encounter  Procedures   Ambulatory referral to Physical Therapy     Procedures: Procedure: ECSWT Indications:  Calf atrophy, PT tendinopathy/dysfunction   Procedure Details Consent: Risks of procedure as well as the alternatives and risks of each were explained to the  patient.  Verbal consent for procedure obtained. Time Out: Verified patient identification, verified procedure, site was marked, verified correct patient position. The area was cleaned with alcohol swab.     The Posterior tibial tendon was targeted for Extracorporeal shockwave therapy.    Preset: Tendinitis Power Level: 90 mJ Frequency: 10 Hz Impulse/cycles: 2000 Head size: Regular  The left medial calf was targeted for Extracorporeal shockwave therapy.    Preset: Muscle atrophy/Restriction Power Level: 80-90 mJ Frequency: 10 Hz Impulse/cycles: 1000 Head size: Regular   Patient  tolerated procedure well without immediate complications.         Clinical History: No specialty comments available.  He reports that he quit smoking about 10 years ago. His smoking use included e-cigarettes and cigarettes. He uses smokeless tobacco.  Recent Labs    10/15/23 0912  LABURIC 3.6*    Objective:    Physical Exam  Gen: Well-appearing, in no acute distress; non-toxic CV: Well-perfused. Warm.  Resp: Breathing unlabored on room air; no wheezing. Psych: Fluid speech in conversation; appropriate affect; normal thought process  Ortho Exam - Left foot/leg: There is still an effusion of the left knee compared to the right knee without effusion.  There is soft tissue swelling from the level of the knee distally.  There is Atrophy left greater than right.  There is tenderness along the course of the posterior tibial tendon near the calf musculature and more so over the arch and medial plantar aspect of the foot.  No specific plantar fascia pain.  There is PT tendon dysfunction with inability to perform plantarflexion unilaterally.  Imaging:  *I did review his left foot x-ray as well as previous left knee MRI and CT scan.  X-ray of the foot shows a mild plantar calcaneal spur there is no significant degenerative change about the midfoot other than minimal talonavicular OA.  Narrative & Impression  CLINICAL DATA:  Pain.   Closed fracture of lateral left tibial plateau, initial encounter.   EXAM: LEFT FOOT - COMPLETE 3+ VIEW   COMPARISON:  None Available.   FINDINGS: There is no evidence of fracture or dislocation. Mild osteoarthritis at the first metatarsal phalangeal joint. Minimal dorsal spurring at the talonavicular joint. Small plantar calcaneal spur. Soft tissues are unremarkable.   IMPRESSION: 1. Mild osteoarthritis at the first metatarsophalangeal joint. 2. Small plantar calcaneal spur.     Electronically Signed   By: Andrea Gasman M.D.   On: 12/20/2023  17:52    Narrative & Impression  CLINICAL DATA:  73 year old male with history of lateral tibial plateau fracture following a fall from a ladder on 11/09/2023. Concern for meniscal injury.   EXAM: MRI OF THE LEFT KNEE WITHOUT CONTRAST   TECHNIQUE: Multiplanar, multisequence MR imaging of the knee was performed. No intravenous contrast was administered.   COMPARISON:  11/09/2023.   FINDINGS: MENISCI   Medial: Complex tear of the posterior horn of the medial meniscus.   Lateral: Linear T2 intermediate to hyperintense signal involving the body of the lateral meniscus extending to the superior and inferior articular surfaces, extending to the posterior horn root junction, most compatible with a complex tear.   LIGAMENTS   Cruciates: Known lateral tibial plateau fracture extends to the tibial spines near the level of the ACL insertion and posteriorly near the level of the PCL insertion with increased ligamentous T2 signal, most compatible with a sprain.   Collaterals: Medial collateral ligament is intact. Lateral collateral ligament complex is intact.   CARTILAGE  Patellofemoral: There is a 8 x 6 mm full-thickness chondral defect along the medial patellar facet with underlying subchondral marrow edema. A subjacent 9 x 4 mm intermediate to low signal focus within the anterior joint space could represent a displaced chondral fragment (series 9, image 16 and series 8, image 16).   Medial: Mild partial-thickness cartilage loss of the medial femorotibial compartment with superimposed regions of moderate to high-grade fissuring along the posterior aspect of the medial femoral condyle. A 7 x 6 x 4 mm intermediate T2 signal intensity focus within the anterior aspect of the medial femorotibial compartment is suspicious for a displaced intra-articular body (series 9, image 14 series 8, image 14).   Lateral: Known lateral tibial plateau fracture extends to the posterior margin  of the lateral tibial plateau with associated subchondral depression. Moderate cartilage loss of the lateral tibial plateau.   JOINT: Large joint effusion with synovitis. Edema in Hoffa's fat-pad.   POPLITEAL FOSSA: Soft tissue edema throughout the popliteal fossa. Popliteus tendon is intact. Small Baker's cyst.   EXTENSOR MECHANISM: Intact quadriceps tendon. Intact patellar tendon.   BONES: Known lateral tibial plateau fracture with associated marrow edema demonstrates subchondral depression along the posterior margin and extension to the level of the tibial spines. There is inferior linear extension along the posterior aspect of the tibial metaphysis. No discrete marrow replacing lesion.   Other: Soft tissue interstitial and intramuscular edema extending throughout the posterior compartment as well as the proximal anterior compartment musculature of the shin.   IMPRESSION: 1. Complex tear of the body of the lateral meniscus extending to the posterior horn root junction. 2. Complex tear of the posterior horn of the medial meniscus. 3. Subacute lateral tibial plateau fracture with associated marrow edema demonstrates subchondral depression along the posterior lateral tibial plateau and extends to the level of the tibial spines. There is inferior linear extension along the posterior aspect of the tibial metaphysis. 4. Findings suspicious for sprains of the ACL and PCL at the level of the tibial insertions. 5. 8 x 6 mm full-thickness chondral defect along the medial patellar facet. A subjacent 9 mm intermediate to low signal focus within the anterior joint space could represent a displaced chondral fragment. 6. Findings suspicious for a displaced intra-articular body in the medial femorotibial compartment with cartilage abnormalities as described above. 7. Large knee joint effusion with synovitis. 8. Soft tissue interstitial and intramuscular edema extending throughout the  posterior compartment as well as the proximal anterior compartment musculature of the shin.     Electronically Signed   By: Harrietta Sherry M.D.   On: 12/10/2023 10:51   Narrative & Impression  CLINICAL DATA:  Recent knee injury with difficulty ambulating and likely lateral tibial plateau fracture on recent plain film   EXAM: CT OF THE LEFT KNEE WITHOUT CONTRAST   TECHNIQUE: Multidetector CT imaging of the left knee was performed according to the standard protocol. Multiplanar CT image reconstructions were also generated.   RADIATION DOSE REDUCTION: This exam was performed according to the departmental dose-optimization program which includes automated exposure control, adjustment of the mA and/or kV according to patient size and/or use of iterative reconstruction technique.   COMPARISON:  Plain film from earlier in the same day.   FINDINGS: Bones/Joint/Cartilage   The distal femur demonstrates osteoporotic changes without acute fracture. The patella is within normal limits. Medial tibial plateau is unremarkable. The lateral tibial plateau shows fragmentation primarily along the posterior margin of the lateral tibial plateau. Impaction of  the dominant fracture fragment is noted of a few mm. Some extension into the intercondylar eminence is seen as well as inferior linear extension along the posterior aspect of the tibial metaphysis. Large joint effusion is noted with fat-fluid and fluid-fluid levels consistent with lipohemarthrosis.   Ligaments   Suboptimally assessed by CT.   Muscles and Tendons   Surrounding musculature is within normal limits.   Soft tissues   Joint effusion is noted. Small posteromedial Baker's cyst is seen. Vascular calcifications are noted.   IMPRESSION: Fracture involving the lateral tibial plateau primarily with evidence of impaction. Extension of the fracture lines into the intercondylar eminence and inferiorly along the posterior  margin of the tibia is seen as well.   Degenerative changes of the knee joint are seen as well as some osteoporotic change.     Electronically Signed   By: Oneil Devonshire M.D.   On: 11/09/2023 22:53    Past Medical/Family/Surgical/Social History: Medications & Allergies reviewed per EMR, new medications updated. 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) 12/21/2021   Piriformis syndrome of both sides 12/21/2021   Hx of shoulder surgery (left rotator cuff surgery, right rotator cuff then reverse total shoulder arthoplasty) 12/07/2021   Lumbar facet arthropathy 12/07/2021   Bilateral primary osteoarthritis of knee 12/07/2021   Sacroiliac joint pain 12/07/2021   Gastroesophageal reflux disease 10/27/2021   S/P CABG (coronary artery bypass graft) 05/26/2021   ICD (implantable cardioverter-defibrillator) in place 05/26/2021   COPD (chronic obstructive pulmonary disease) (HCC) 05/26/2021   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   Insomnia 05/26/2021   Atherosclerotic heart disease of native coronary artery with unspecified angina pectoris (HCC) 05/26/2021   BPH (benign prostatic hyperplasia) 03/25/2021   Stage 3b chronic kidney disease (HCC) 06/16/2018   Hypertension 04/03/2018   Hyperlipidemia 04/03/2018   Chronic pain 04/03/2018   Chronic systolic congestive heart failure (HCC) 04/03/2018   Gout 04/03/2018   Renal artery stenosis 04/03/2018   Thrombocytopenia 04/03/2018   Anemia in chronic kidney disease 04/03/2018   Ischemic dilated  cardiomyopathy (HCC) 06/29/2016   VT (ventricular tachycardia) (HCC) 06/28/2016   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)    Medtronic   Myocardial infarction [facility])    Stroke (HCC)    History reviewed. No pertinent family history. Past Surgical History:  Procedure Laterality Date   arm surgery Right    broken - pins placed but now removed   COLONOSCOPY  CORONARY ARTERY BYPASS GRAFT     in [place]   EYE SURGERY Bilateral    remove cataracts   HERNIA REPAIR     ICD IMPLANT     Medtronic   INGUINAL HERNIA REPAIR Bilateral 08/19/2023   Procedure: OPEN BILATERAL INGUINAL HERNIA REPAIR WITH MESH;  Surgeon: Rubin Calamity, MD;  Location: [facility] OR;  Service: General;  Laterality: Bilateral;   JOINT REPLACEMENT Right    shoulder in [place]   MANDIBLE SURGERY     lower jaw surgery for placement of dentures per patient   MULTIPLE TOOTH EXTRACTIONS     all teeth pulled - dentures   TONSILLECTOMY     UPPER GI ENDOSCOPY     WISDOM TOOTH EXTRACTION     Social History   Occupational History   Not on file  Tobacco Use   Smoking status: Former    Types: E-cigarettes, Cigarettes    Quit date: 2015    Years since quitting: 10.7   Smokeless tobacco: Current   Tobacco comments:    Quit smoking cigarettes in 2015 - 1 ppd  Vaping Use   Vaping status: Every Day   Substances: Nicotine  Substance and Sexual  Activity   Alcohol use: Not Currently   Drug use: Never   Sexual activity: Not Currently

## 2024-02-11 ENCOUNTER — Telehealth: Payer: Self-pay

## 2024-02-11 ENCOUNTER — Ambulatory Visit: Attending: Nurse Practitioner | Admitting: Nurse Practitioner

## 2024-02-11 DIAGNOSIS — Z01810 Encounter for preprocedural cardiovascular examination: Secondary | ICD-10-CM

## 2024-02-11 NOTE — Progress Notes (Signed)
 Anesthesia Chart Review:  73 year old male follows with cardiology for history of hypertension, hyperlipidemia, CAD s/p MI with three-vessel CABG in 2008, ICM s/p Medtronic ICD with subsequent recovery of EF to low normal, CVA, PAF on Eliquis , PVC, aortic dilation, RAS s/p right renal artery stent. Echo 08/2022 low normal LVEF 50 to 55%, no RWMA, mild LVH, indeterminate diastolic parameters, RV mildly enlarged, bilateral atria severely dilated, mild MR, mild dilation of aortic root 42 mm.  Seen by Damien Braver, NP 02/10/2024 for preop evaluation.  Per note, According to the Revised Cardiac Risk Index (RCRI), his Perioperative Risk of Major Cardiac Event is (%): 0.9. His Functional Capacity in METs is: 5.62 according to the Duke Activity Status Index (DASI). Therefore, based on ACC/AHA guidelines, patient would be at acceptable risk for the planned procedure without further cardiovascular testing. The patient was advised that if he develops new symptoms prior to surgery to contact our office to arrange for a follow-up visit, and he verbalized understanding. Per office protocol, patient can hold Eliquis  for 2 days prior to procedure. Please resume Eliquis  as soon as possible postprocedure, at the discretion of the surgeon.  Other pertinent history includes former smoker with associated COPD (maintained on Trelegy and as needed albuterol ), GERD on PPI, CKDIII, chronic pain, neuropathy, BPH.   Patient reports last dose of Eliquis  02/09/2024.  Preop labs reviewed, creatinine mildly elevated 1.37 consistent with history of CKD, mild thrombocytopenia platelets 124, otherwise unremarkable.   EKG 12/05/2023: Sinus rhythm with frequent PVCs.  Rate 78.   Perioperative prescription for implanted cardiac device programming per progress note 01/28/2024: Device Information:   Clinic EP Physician:   Dr. Ole Holts Device Type:  Defibrillator Manufacturer and Phone #:  Medtronic: [id number] Pacemaker  Dependent?:  No Date of Last Device Check:  01/14/2024         Normal Device Function?:  Yes     Electrophysiologist's Recommendations:   Have magnet available. Provide continuous ECG monitoring when magnet is used or reprogramming is to be performed.  Procedure may interfere with device function.  Magnet should be placed over device during procedure.   TTE 08/24/2022: 1. Left ventricular ejection fraction, by estimation, is 50 to 55%. Left  ventricular ejection fraction by 3D volume is 52 %. The left ventricle has  low normal function. The left ventricle has no regional wall motion  abnormalities. There is mild left  ventricular hypertrophy of the basal-septal segment. Left ventricular  diastolic parameters are indeterminate.   2. Right ventricular systolic function is normal. The right ventricular  size is mildly enlarged.   3. Left atrial size was severely dilated.   4. Right atrial size was severely dilated.   5. The mitral valve is normal in structure. Mild mitral valve  regurgitation. No evidence of mitral stenosis.   6. The aortic valve is normal in structure. Aortic valve regurgitation is  not visualized. No aortic stenosis is present.   7. Aortic dilatation noted. There is mild dilatation of the aortic root,  measuring 42 mm.   8. The inferior vena cava is normal in size with greater than 50%  respiratory variability, suggesting right atrial pressure of 3 mmHg.    Cath 08/05/2020 (Care Everywhere): Findings:  --LM: Distal LM has a 95% stenosis  --LAD: The proximal LAD has diffuse moderate-severe disease. The mid LAD  is 100% occluded. The LIMA-LAD is patent. The distal LAD has mild diffuse  disease. The SVG-diagonal is known to be occluded and was not  injected.  --LCx: The proximal LCx is 100% occluded. The SVG-OM is widely patent. The  distal OM territory it serves has only minimal luminal disease.  --RCA: Not engaged. Known to be 100% occluded in the proximal segment.  The  SVG-PDA is patent but has TIMI II flow. There are R-L collaterals.   Intervention: none   Comments:  After informed consent was obtained, the patient was brought to the cath  lab in a fasting state and prepped and draped in a sterile fashion. Access  to the right femoral artery was obtained via micropuncture technique under  ultrasound and fluoroscopic guidance. Diagnostic coronary angiography was  obtained of the left coronary artery with a 57F JL3.5 catheter. Unable to  engage the SVG-RCA with the JR4 diagnostic catheter. This was exchanged  for a 57F IM catheter and angiography of the LIMA-LAD was obtained. Next a  57F multipurpose catheter was used to selectively engage the SVG-RCA. The  native RCA and SVG-diagonal were not selectively engaged as they are known  to both be 100% occluded and he has underlying CKD.   Recommendations:  Native disease may have progressed somewhat but 3/4 grafts are still  patent with good distal flow. No indication for PCI prior to elective  non-cardiac surgery. It is reasonable to proceed, continuing aspirin 81 mg  and beta blocker uninterrupted.       Lynwood Geofm RIGGERS [facility]/Anesthesiology Phone [phone] 02/11/2024 4:15 PM

## 2024-02-11 NOTE — Telephone Encounter (Signed)
 Preop tele appt now scheduled, med rec and consent done. Pt added on today as he states procedure is tomorrow. Slot ok per APP

## 2024-02-11 NOTE — Progress Notes (Signed)
 Virtual Visit via Telephone Note   Because of [name] co-morbid illnesses, he is at least at moderate risk for complications without adequate follow up.  This format is felt to be most appropriate for this patient at this time.  Due to technical limitations with video connection (technology), today's appointment will be conducted as an audio only telehealth visit, and [name] verbally agreed to proceed in this manner.   All issues noted in this document were discussed and addressed.  No physical exam could be performed with this format.  Evaluation Performed:  Preoperative cardiovascular risk assessment _____________   Date:  02/11/2024   Patient ID:  Andrew Townsend, DOB April 05, 1951, MRN [id number] Patient Location:  Home Provider location:   Office  Primary Care Provider:  Chandra Toribio POUR, MD Primary Cardiologist:  Shelda Bruckner, MD  Chief Complaint / Patient Profile   73 y.o. y/o male with a h/o CAD s/p CABG, ICM s/p ICD, paroxysmal atrial fibrillation, PVCs, aortic dilation, renal artery stenosis s/p right renal artery stenting, hypertension, hyperlipidemia, CKD stage III, COPD, and gout who is pending hernia surgery on 02/12/2024 with Dr. Lynda Leos of [facility] and presents today for telephonic preoperative cardiovascular risk assessment.  History of Present Illness    Andrew Townsend is a 73 y.o. male who presents via audio/video conferencing for a telehealth visit today.  Pt was last seen in cardiology clinic on 12/05/2023 by Dr. Waddell.  At that time [name] was doing well.  The patient is now pending procedure as outlined above. Since his last visit, he has been stable from a cardiac standpoint.  He broke his leg in June 2025, this has somewhat limited his mobility.  He denies chest pain, palpitations, dyspnea, pnd, orthopnea, n, v, dizziness, syncope, edema, weight gain, or early satiety. All other systems reviewed and are otherwise negative except as noted  above.   Past Medical History    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)    Medtronic   Myocardial infarction (HCC)    Stroke [facility])    Past Surgical History:  Procedure Laterality Date   arm surgery Right    broken - pins placed but now removed   COLONOSCOPY     CORONARY ARTERY BYPASS GRAFT     in [place]   EYE SURGERY Bilateral    remove cataracts   HERNIA REPAIR     ICD IMPLANT     Medtronic   INGUINAL HERNIA REPAIR Bilateral 08/19/2023   Procedure: OPEN BILATERAL INGUINAL HERNIA REPAIR WITH MESH;  Surgeon: Leos Lynda, MD;  Location: [facility] OR;  Service: General;  Laterality: Bilateral;   JOINT REPLACEMENT Right    shoulder in [place]   MANDIBLE SURGERY     lower jaw surgery for placement of dentures per patient   MULTIPLE TOOTH EXTRACTIONS  all teeth pulled - dentures   TONSILLECTOMY     UPPER GI ENDOSCOPY     WISDOM TOOTH EXTRACTION      Allergies  No Known Allergies  Home Medications    Prior to Admission medications   Medication Sig Start Date End Date Taking? Authorizing Provider  albuterol  (PROVENTIL ) (2.5 MG/3ML) 0.083% nebulizer solution INHALE THE CONTENTS OF 1 VIAL VIA NEBULIZER EVERY 6 HOURS AS NEEDED FOR WHEEZING OR SHORTNESS OF BREATH. 07/09/22   Hanford Powell BRAVO, NP  albuterol  (VENTOLIN  HFA) 108 (90 Base) MCG/ACT inhaler Inhale 2 puffs into the lungs every 6 (six) hours  as needed for wheezing or shortness of breath. 05/09/23   Wallace Joesph LABOR, PA  allopurinol  (ZYLOPRIM ) 100 MG tablet Take 1 tablet (100 mg total) by mouth daily. 12/24/23   Chandra Toribio POUR, MD  buprenorphine  (BUTRANS ) 20 MCG/HR PTWK Place 1 patch onto the skin once a week. 12/23/23   Patel, Seema K, NP  docusate sodium (COLACE) 100 MG capsule Take 200 mg by mouth daily.    [provider]  ELIQUIS  2.5 MG TABS tablet TAKE 1 TABLET BY MOUTH 2 TIMES DAILY. 11/20/23   Lonni Slain, MD  empagliflozin  (JARDIANCE ) 10 MG TABS tablet Take 1 tablet (10 mg total) by mouth daily before breakfast. 12/24/23   Chandra Toribio POUR, MD  ezetimibe  (ZETIA ) 10 MG tablet Take 1 tablet (10 mg total) by mouth daily. 12/24/23   Chandra Toribio POUR, MD  Fluticasone-Umeclidin-Vilant (TRELEGY ELLIPTA ) 100-62.5-25 MCG/ACT AEPB Inhale 1 puff into the lungs daily. 12/24/23   Chandra Toribio POUR, MD  furosemide  (LASIX ) 40 MG tablet Take 1 tablet (40 mg total) by mouth daily as needed for fluid. 07/31/22 02/11/24  Leverne Charlies Helling, PA-C  gabapentin  (NEURONTIN ) 600 MG tablet Take 2 tablets (1,200 mg total) by mouth at bedtime. 12/23/23 03/22/24  Patel, Seema K, NP  Magnesium Oxide (DIASENSE MAGNESIUM PO) Take 1,000 mg by mouth daily.    [provider]  metoprolol  succinate (TOPROL -XL) 100 MG 24 hr tablet Take 1 tablet (100 mg total) by mouth daily. PLEASE NOTE DECREASED DOSE. 12/24/23 03/23/24  Chandra Toribio POUR, MD  Multiple Vitamin (MULTIVITAMIN) tablet Take 1 tablet by mouth daily.    [provider]  nitroGLYCERIN  (NITROSTAT ) 0.4 MG SL tablet Place 1 tablet (0.4 mg total) under the tongue every 5 (five) minutes as needed for chest pain. If second dose is needed to relieve chest pain, seek immediate emergency medical attention. 02/07/23   Wallace Joesph LABOR, PA  omeprazole  (PRILOSEC) 20 MG capsule Take 1 capsule (20 mg total) by mouth daily. 12/24/23   Chandra Toribio POUR, MD  oxyCODONE -acetaminophen  (PERCOCET) 10-325 MG tablet Take  1 tablet by mouth every 8 (eight) hours as needed for pain. 01/20/24 02/19/24  Patel, Seema K, NP  oxyCODONE -acetaminophen  (PERCOCET) 10-325 MG tablet Take 1 tablet by mouth every 8 (eight) hours as needed for pain. 02/19/24 03/20/24  Patel, Seema K, NP  potassium chloride  (KLOR-CON  M) 10 MEQ tablet Take 1 tablet (10 mEq total) by mouth daily. 12/24/23   Chandra Toribio POUR, MD  rosuvastatin  (CRESTOR ) 40 MG tablet Take 1 tablet (40 mg total) by mouth at bedtime. 12/24/23   Chandra Toribio POUR, MD  tiZANidine  (ZANAFLEX ) 4 MG tablet Take 1 tablet (4 mg total) by mouth every 6 (six) hours as needed. 12/12/23   Patel, Seema K, NP  Vibegron  (GEMTESA ) 75 MG TABS Take 1 tablet (75 mg total) by mouth daily. 09/19/23   McGowan,  Shannon A, PA-C  Vibegron  (GEMTESA ) 75 MG TABS Take 1 tablet (75 mg total) by mouth daily. 10/30/23   Helon Kirsch A, PA-C  zolpidem  (AMBIEN  CR) 6.25 MG CR tablet Take 1 tablet (6.25 mg total) by mouth at bedtime as needed for sleep. 12/24/23   Chandra Toribio POUR, MD    Physical Exam    Vital Signs:  Andrew Townsend does not have vital signs available for review today.  Given telephonic nature of communication, physical exam is limited. AAOx3. NAD. Normal affect.  Speech and respirations are unlabored.  Accessory Clinical Findings    None  Assessment & Plan    1.  Preoperative Cardiovascular Risk Assessment:  According to the Revised Cardiac Risk Index (RCRI), his Perioperative Risk of Major Cardiac Event is (%): 0.9. His Functional Capacity in METs is: 5.62 according to the Duke Activity Status Index (DASI). Therefore, based on ACC/AHA guidelines, patient would be at acceptable risk for the planned procedure without further cardiovascular testing.   The patient was advised that if he develops new symptoms prior to surgery to contact our office to arrange for a follow-up visit, and he verbalized understanding.  Per office protocol, patient can hold Eliquis  for 2 days prior to procedure. Please  resume Eliquis  as soon as possible postprocedure, at the discretion of the surgeon.   A copy of this note will be routed to requesting surgeon.  Time:   Today, I have spent 5 minutes with the patient with telehealth technology discussing medical history, symptoms, and management plan.     Damien JAYSON Braver, NP  02/11/2024, 1:07 PM

## 2024-02-11 NOTE — Telephone Encounter (Signed)
 I was added on to a secure chat this morning, see the notes below.   Me: I will review and send back to preop APP today to review   PAC-C: Thanks for your help      Lynwood Hope, PA-C: Erskin Sow, I work in the preop department and am reviewing the attached pt for surgery tomorrow. It looks like a cardiac clearance was initiated by the surgeon's office on 9/5 and Josefa Beauvais forwarded it to Dr. Waddell on 9/9 for input, but I dont see that it was ever completed. Is there possibly some communication that I'm not seeing? Ah I see that Delon just forwarded to Charlies Arthur for review.    Sow Blight, CMA: So sorry I wasn't here yesterday just got back today and I been in clinic I attached Niels she is in preop she be the best help.

## 2024-02-11 NOTE — Anesthesia Preprocedure Evaluation (Addendum)
 Anesthesia Evaluation  Patient identified by MRN, date of birth, ID band Patient awake    Reviewed: Allergy & Precautions, NPO status , Patient's Chart, lab work & pertinent test results  History of Anesthesia Complications Negative for: history of anesthetic complications  Airway Mallampati: III  TM Distance: >3 FB Neck ROM: Full   Comment: Previous grade I view with Miller 2, easy mask Dental  (+) Dental Advisory Given, Edentulous Upper, Edentulous Lower   Pulmonary neg shortness of breath, neg sleep apnea, COPD (used Trelegy this morning),  COPD inhaler, neg recent URI, Patient abstained from smoking., former smoker   Pulmonary exam normal breath sounds clear to auscultation       Cardiovascular hypertension (metoprolol ), Pt. on home beta blockers (-) angina + CAD, + Past MI, + CABG (2008) and +CHF  + dysrhythmias Ventricular Tachycardia + Cardiac Defibrillator  Rhythm:Regular Rate:Normal  HLD  TTE 08/24/2022: IMPRESSIONS    1. Left ventricular ejection fraction, by estimation, is 50 to 55%. Left  ventricular ejection fraction by 3D volume is 52 %. The left ventricle has  low normal function. The left ventricle has no regional wall motion  abnormalities. There is mild left  ventricular hypertrophy of the basal-septal segment. Left ventricular  diastolic parameters are indeterminate.   2. Right ventricular systolic function is normal. The right ventricular  size is mildly enlarged.   3. Left atrial size was severely dilated.   4. Right atrial size was severely dilated.   5. The mitral valve is normal in structure. Mild mitral valve  regurgitation. No evidence of mitral stenosis.   6. The aortic valve is normal in structure. Aortic valve regurgitation is  not visualized. No aortic stenosis is present.   7. Aortic dilatation noted. There is mild dilatation of the aortic root,  measuring 42 mm.   8. The inferior vena cava is  normal in size with greater than 50%  respiratory variability, suggesting right atrial pressure of 3 mmHg.     Neuro/Psych  Neuromuscular disease (neuropathy, lumbar radiculopathy) CVA, No Residual Symptoms    GI/Hepatic Neg liver ROS,GERD  ,,  Endo/Other  negative endocrine ROS    Renal/GU CRFRenal disease   BPH    Musculoskeletal  (+) Arthritis , Osteoarthritis,  Scoliosis    Abdominal   Peds  Hematology  (+) Blood dyscrasia, anemia Lab Results      Component                Value               Date                      WBC                      7.9                 02/07/2024                HGB                      14.5                02/07/2024                HCT                      46.6  02/07/2024                MCV                      95.5                02/07/2024                PLT                      181                 02/07/2024              Anesthesia Other Findings 73 y.o. y/o male with a h/o CAD s/p CABG, ICM s/p ICD, paroxysmal atrial fibrillation, PVCs, aortic dilation, renal artery stenosis s/p right renal artery stenting, hypertension, hyperlipidemia, CKD stage III, COPD, and gout who is pending hernia surgery on 02/12/2024 with Dr. Lynda Leos  Electrophysiologist's Recommendations:    Have magnet available.  Provide continuous ECG monitoring when magnet is used or reprogramming is to be performed.   Procedure may interfere with device function.  Magnet should be placed over device during procedure.   On buprenorphine  patch  Last Eliquis :  Last Jardiance :  Reproductive/Obstetrics                              Anesthesia Physical Anesthesia Plan  ASA: 3  Anesthesia Plan: General   Post-op Pain Management: Regional block* and Tylenol  PO (pre-op)*   Induction: Intravenous  PONV Risk Score and Plan: 2 and Ondansetron , Dexamethasone  and Treatment may vary due to age or medical condition  Airway  Management Planned: Oral ETT  Additional Equipment:   Intra-op Plan:   Post-operative Plan: Extubation in OR  Informed Consent: I have reviewed the patients History and Physical, chart, labs and discussed the procedure including the risks, benefits and alternatives for the proposed anesthesia with the patient or authorized representative who has indicated his/her understanding and acceptance.     Dental advisory given  Plan Discussed with: CRNA and Anesthesiologist  Anesthesia Plan Comments: (Discussed potential risks of nerve blocks including, but not limited to, infection, bleeding, nerve damage, seizures, pneumothorax, respiratory depression, and potential failure of the block. Alternatives to nerve blocks discussed. All questions answered.  Risks of general anesthesia discussed including, but not limited to, sore throat, hoarse voice, chipped/damaged teeth, injury to vocal cords, nausea and vomiting, allergic reactions, lung infection, heart attack, stroke, and death. All questions answered.   PAT note by Lynwood Hope, PA-C: 73 year old male follows with cardiology for history of hypertension, hyperlipidemia, CAD s/p MI with three-vessel CABG in 2008, ICM s/p Medtronic ICD with subsequent recovery of EF to low normal, CVA, PAF on Eliquis , PVC, aortic dilation, RAS s/p right renal artery stent. Echo 08/2022 low normal LVEF 50 to 55%, no RWMA, mild LVH, indeterminate diastolic parameters, RV mildly enlarged, bilateral atria severely dilated, mild MR, mild dilation of aortic root 42 mm.  Seen by Damien Braver, NP 02/10/2024 for preop evaluation.  Per note, According to the Revised Cardiac Risk Index (RCRI), his Perioperative Risk of Major Cardiac Event is (%): 0.9. His Functional Capacity in METs is: 5.62 according to the Duke Activity Status Index (DASI). Therefore, based on ACC/AHA guidelines, patient would be at acceptable risk for the planned procedure without further cardiovascular testing.  The patient was  advised that if he develops new symptoms prior to surgery to contact our office to arrange for a follow-up visit, and he verbalized understanding. Per office protocol, patient can hold Eliquis  for 2 days prior to procedure. Please resume Eliquis  as soon as possible postprocedure, at the discretion of the surgeon.  Other pertinent history includes former smoker with associated COPD (maintained on Trelegy and as needed albuterol ), GERD on PPI, CKDIII, chronic pain, neuropathy, BPH.  Patient reports last dose of Eliquis  02/09/2024.  Preop labs reviewed, creatinine mildly elevated 1.37 consistent with history of CKD, mild thrombocytopenia platelets 124, otherwise unremarkable.  EKG 12/05/2023: Sinus rhythm with frequent PVCs.  Rate 78.  Perioperative prescription for implanted cardiac device programming per progress note 01/28/2024: Device Information:  Clinic EP Physician:Dr. Ole Holts Device Type:Defibrillator Manufacturer and Phone #:Medtronic: [phone] Pacemaker Dependent?:No Date of Last Device Check:01/14/2024 Normal Device Function?:Yes  Electrophysiologist's Recommendations:   Have magnet available.  Provide continuous ECG monitoring when magnet is used or reprogramming is to be performed.  Procedure may interfere with device function.  Magnet should be placed over device during procedure.  TTE 08/24/2022: 1. Left ventricular ejection fraction, by estimation, is 50 to 55%. Left  ventricular ejection fraction by 3D volume is 52 %. The left ventricle has  low normal function. The left ventricle has no regional wall motion  abnormalities. There is mild left  ventricular hypertrophy of the basal-septal segment. Left ventricular  diastolic parameters are indeterminate.  2. Right ventricular systolic function is normal. The right ventricular  size is mildly enlarged.  3. Left atrial size was severely dilated.  4. Right  atrial size was severely dilated.  5. The mitral valve is normal in structure. Mild mitral valve  regurgitation. No evidence of mitral stenosis.  6. The aortic valve is normal in structure. Aortic valve regurgitation is  not visualized. No aortic stenosis is present.  7. Aortic dilatation noted. There is mild dilatation of the aortic root,  measuring 42 mm.  8. The inferior vena cava is normal in size with greater than 50%  respiratory variability, suggesting right atrial pressure of 3 mmHg.   Cath 08/05/2020 (Care Everywhere): Findings:  --LM: Distal LM has a 95% stenosis  --LAD: The proximal LAD has diffuse moderate-severe disease. The mid LAD  is 100% occluded. The LIMA-LAD is patent. The distal LAD has mild diffuse  disease. The SVG-diagonal is known to be occluded and was not injected.  --LCx: The proximal LCx is 100% occluded. The SVG-OM is widely patent. The  distal OM territory it serves has only minimal luminal disease.  --RCA: Not engaged. Known to be 100% occluded in the proximal segment. The  SVG-PDA is patent but has TIMI II flow. There are R-L collaterals.   Intervention: none   Comments:  After informed consent was obtained, the patient was brought to the cath  lab in a fasting state and prepped and draped in a sterile fashion. Access  to the right femoral artery was obtained via micropuncture technique under  ultrasound and fluoroscopic guidance. Diagnostic coronary angiography was  obtained of the left coronary artery with a 51F JL3.5 catheter. Unable to  engage the SVG-RCA with the JR4 diagnostic catheter. This was exchanged  for a 51F IM catheter and angiography of the LIMA-LAD was obtained. Next a  51F multipurpose catheter was used to selectively engage the SVG-RCA. The  native RCA and SVG-diagonal were not selectively engaged as they are known  to both be 100% occluded and  he has underlying CKD.   Recommendations:  Native disease may have progressed somewhat  but 3/4 grafts are still  patent with good distal flow. No indication for PCI prior to elective  non-cardiac surgery. It is reasonable to proceed, continuing aspirin 81 mg  and beta blocker uninterrupted.    )         Anesthesia Quick Evaluation

## 2024-02-11 NOTE — Telephone Encounter (Signed)
  Patient Consent for Virtual Visit        Andrew Townsend has provided verbal consent on 02/11/2024 for a virtual visit (video or telephone).   CONSENT FOR VIRTUAL VISIT FOR:  Andrew Townsend  By participating in this virtual visit I agree to the following:  I hereby voluntarily request, consent and authorize [facility] and its employed or contracted physicians, physician assistants, nurse practitioners or other licensed health care professionals (the Practitioner), to provide me with telemedicine health care services (the "Services) as deemed necessary by the treating Practitioner. I acknowledge and consent to receive the Services by the Practitioner via telemedicine. I understand that the telemedicine visit will involve communicating with the Practitioner through live audiovisual communication technology and the disclosure of certain medical information by electronic transmission. I acknowledge that I have been given the opportunity to request an in-person assessment or other available alternative prior to the telemedicine visit and am voluntarily participating in the telemedicine visit.  I understand that I have the right to withhold or withdraw my consent to the use of telemedicine in the course of my care at any time, without affecting my right to future care or treatment, and that the Practitioner or I may terminate the telemedicine visit at any time. I understand that I have the right to inspect all information obtained and/or recorded in the course of the telemedicine visit and may receive copies of available information for a reasonable fee.  I understand that some of the potential risks of receiving the Services via telemedicine include:  Delay or interruption in medical evaluation due to technological equipment failure or disruption; Information transmitted may not be sufficient (e.g. poor resolution of images) to allow for appropriate medical decision making by the Practitioner; and/or   In rare instances, security protocols could fail, causing a breach of personal health information.  Furthermore, I acknowledge that it is my responsibility to provide information about my medical history, conditions and care that is complete and accurate to the best of my ability. I acknowledge that Practitioner's advice, recommendations, and/or decision may be based on factors not within their control, such as incomplete or inaccurate data provided by me or distortions of diagnostic images or specimens that may result from electronic transmissions. I understand that the practice of medicine is not an exact science and that Practitioner makes no warranties or guarantees regarding treatment outcomes. I acknowledge that a copy of this consent can be made available to me via my patient portal [facility] MyChart), or I can request a printed copy by calling the office of [facility].    I understand that my insurance will be billed for this visit.   I have read or had this consent read to me. I understand the contents of this consent, which adequately explains the benefits and risks of the Services being provided via telemedicine.  I have been provided ample opportunity to ask questions regarding this consent and the Services and have had my questions answered to my satisfaction. I give my informed consent for the services to be provided through the use of telemedicine in my medical care

## 2024-02-11 NOTE — Telephone Encounter (Signed)
   Name: Andrew Townsend  DOB: [birth year]  MRN: [id number]  Primary Cardiologist: Shelda Bruckner, MD   Preoperative team, please contact this patient and set up a phone call appointment for further preoperative risk assessment. Please obtain consent and complete medication review. Thank you for your help.  I confirm that guidance regarding antiplatelet and oral anticoagulation therapy has been completed and, if necessary, noted below.Per office protocol, patient can hold Eliquis  for 2 days prior to procedure.  I also confirmed the patient resides in the state of [place] . As per [facility] telemedicine laws, the patient must reside in the state in which the provider is licensed.   [name], PA-C 02/11/2024, 10:19 AM [facility]

## 2024-02-12 ENCOUNTER — Other Ambulatory Visit: Payer: Self-pay

## 2024-02-12 ENCOUNTER — Encounter (HOSPITAL_COMMUNITY)
Admission: RE | Disposition: A | Discharge status: Home / Self Care | Payer: Self-pay | Source: Home / Self Care | Attending: General Surgery

## 2024-02-12 ENCOUNTER — Ambulatory Visit (HOSPITAL_COMMUNITY)
Admission: RE | Admit: 2024-02-12 | Discharge: 2024-02-12 | Disposition: A | Discharge status: Home / Self Care | Attending: General Surgery | Admitting: General Surgery

## 2024-02-12 ENCOUNTER — Ambulatory Visit (HOSPITAL_COMMUNITY): Payer: Self-pay | Admitting: Physician Assistant

## 2024-02-12 ENCOUNTER — Encounter (HOSPITAL_COMMUNITY): Payer: Self-pay | Admitting: General Surgery

## 2024-02-12 ENCOUNTER — Ambulatory Visit (HOSPITAL_BASED_OUTPATIENT_CLINIC_OR_DEPARTMENT_OTHER): Payer: Self-pay | Admitting: Anesthesiology

## 2024-02-12 DIAGNOSIS — I252 Old myocardial infarction: Secondary | ICD-10-CM | POA: Insufficient documentation

## 2024-02-12 DIAGNOSIS — K4090 Unilateral inguinal hernia, without obstruction or gangrene, not specified as recurrent: Secondary | ICD-10-CM | POA: Diagnosis not present

## 2024-02-12 DIAGNOSIS — I480 Paroxysmal atrial fibrillation: Secondary | ICD-10-CM | POA: Diagnosis not present

## 2024-02-12 DIAGNOSIS — Z79891 Long term (current) use of opiate analgesic: Secondary | ICD-10-CM | POA: Diagnosis not present

## 2024-02-12 DIAGNOSIS — J449 Chronic obstructive pulmonary disease, unspecified: Secondary | ICD-10-CM | POA: Diagnosis not present

## 2024-02-12 DIAGNOSIS — I509 Heart failure, unspecified: Secondary | ICD-10-CM | POA: Diagnosis not present

## 2024-02-12 DIAGNOSIS — Z951 Presence of aortocoronary bypass graft: Secondary | ICD-10-CM | POA: Diagnosis not present

## 2024-02-12 DIAGNOSIS — N183 Chronic kidney disease, stage 3 unspecified: Secondary | ICD-10-CM | POA: Insufficient documentation

## 2024-02-12 DIAGNOSIS — G8918 Other acute postprocedural pain: Secondary | ICD-10-CM | POA: Diagnosis not present

## 2024-02-12 DIAGNOSIS — I472 Ventricular tachycardia, unspecified: Secondary | ICD-10-CM | POA: Insufficient documentation

## 2024-02-12 DIAGNOSIS — E785 Hyperlipidemia, unspecified: Secondary | ICD-10-CM | POA: Insufficient documentation

## 2024-02-12 DIAGNOSIS — K4140 Unilateral femoral hernia, with gangrene, not specified as recurrent: Secondary | ICD-10-CM | POA: Insufficient documentation

## 2024-02-12 DIAGNOSIS — Z8673 Personal history of transient ischemic attack (TIA), and cerebral infarction without residual deficits: Secondary | ICD-10-CM | POA: Insufficient documentation

## 2024-02-12 DIAGNOSIS — Z87891 Personal history of nicotine dependence: Secondary | ICD-10-CM | POA: Diagnosis not present

## 2024-02-12 DIAGNOSIS — F17290 Nicotine dependence, other tobacco product, uncomplicated: Secondary | ICD-10-CM | POA: Insufficient documentation

## 2024-02-12 DIAGNOSIS — I130 Hypertensive heart and chronic kidney disease with heart failure and stage 1 through stage 4 chronic kidney disease, or unspecified chronic kidney disease: Secondary | ICD-10-CM | POA: Insufficient documentation

## 2024-02-12 DIAGNOSIS — I2510 Atherosclerotic heart disease of native coronary artery without angina pectoris: Secondary | ICD-10-CM | POA: Diagnosis not present

## 2024-02-12 DIAGNOSIS — Z950 Presence of cardiac pacemaker: Secondary | ICD-10-CM | POA: Insufficient documentation

## 2024-02-12 DIAGNOSIS — I10 Essential (primary) hypertension: Secondary | ICD-10-CM | POA: Diagnosis not present

## 2024-02-12 DIAGNOSIS — Z7951 Long term (current) use of inhaled steroids: Secondary | ICD-10-CM | POA: Diagnosis not present

## 2024-02-12 DIAGNOSIS — G8929 Other chronic pain: Secondary | ICD-10-CM | POA: Diagnosis not present

## 2024-02-12 DIAGNOSIS — Z95828 Presence of other vascular implants and grafts: Secondary | ICD-10-CM | POA: Diagnosis not present

## 2024-02-12 HISTORY — PX: INGUINAL HERNIA REPAIR: SHX194

## 2024-02-12 LAB — GLUCOSE, CAPILLARY: Glucose-Capillary: 99 mg/dL (ref 70–99)

## 2024-02-12 SURGERY — REPAIR, HERNIA, INGUINAL, ADULT
Anesthesia: General | Site: Abdomen | Laterality: Right

## 2024-02-12 MED ORDER — CEFAZOLIN SODIUM-DEXTROSE 2-4 GM/100ML-% IV SOLN
2.0000 g | INTRAVENOUS | Status: AC
Start: 2024-02-12 — End: 2024-02-12
  Administered 2024-02-12: 2 g via INTRAVENOUS

## 2024-02-12 MED ORDER — FENTANYL CITRATE (PF) 100 MCG/2ML IJ SOLN: 25.0000 ug | INTRAMUSCULAR | Status: DC | PRN

## 2024-02-12 MED ORDER — BUPIVACAINE HCL (PF) 0.25 % IJ SOLN
INTRAMUSCULAR | Status: DC | PRN
  Administered 2024-02-12: 30 mL via PERINEURAL

## 2024-02-12 MED ORDER — EPHEDRINE 5 MG/ML INJ
INTRAVENOUS | Status: AC
  Filled 2024-02-12: qty 5

## 2024-02-12 MED ORDER — OXYCODONE HCL 5 MG/5ML PO SOLN: 5.0000 mg | Freq: Once | ORAL | Status: AC | PRN

## 2024-02-12 MED ORDER — CHLORHEXIDINE GLUCONATE 0.12 % MT SOLN
OROMUCOSAL | Status: AC
  Administered 2024-02-12: 15 mL via OROMUCOSAL
  Filled 2024-02-12: qty 15

## 2024-02-12 MED ORDER — OXYCODONE-ACETAMINOPHEN 5-325 MG PO TABS: 1.0000 | ORAL_TABLET | ORAL | 0 refills | Status: DC | PRN

## 2024-02-12 MED ORDER — LIDOCAINE 2% (20 MG/ML) 5 ML SYRINGE
INTRAMUSCULAR | Status: DC | PRN
  Administered 2024-02-12: 80 mg via INTRAVENOUS

## 2024-02-12 MED ORDER — ROCURONIUM BROMIDE 10 MG/ML (PF) SYRINGE
PREFILLED_SYRINGE | INTRAVENOUS | Status: DC | PRN
  Administered 2024-02-12: 10 mg via INTRAVENOUS
  Administered 2024-02-12: 40 mg via INTRAVENOUS

## 2024-02-12 MED ORDER — PROPOFOL 10 MG/ML IV BOLUS
INTRAVENOUS | Status: AC
Start: 2024-02-12 — End: 2024-02-12
  Filled 2024-02-12: qty 20

## 2024-02-12 MED ORDER — BUPIVACAINE-EPINEPHRINE (PF) 0.25% -1:200000 IJ SOLN
INTRAMUSCULAR | Status: AC
  Filled 2024-02-12: qty 30

## 2024-02-12 MED ORDER — FENTANYL CITRATE (PF) 250 MCG/5ML IJ SOLN
INTRAMUSCULAR | Status: DC | PRN
  Administered 2024-02-12: 100 ug via INTRAVENOUS

## 2024-02-12 MED ORDER — ONDANSETRON HCL 4 MG/2ML IJ SOLN
INTRAMUSCULAR | Status: AC
  Filled 2024-02-12: qty 2

## 2024-02-12 MED ORDER — EPHEDRINE SULFATE-NACL 50-0.9 MG/10ML-% IV SOSY
PREFILLED_SYRINGE | INTRAVENOUS | Status: DC | PRN
  Administered 2024-02-12: 5 mg via INTRAVENOUS
  Administered 2024-02-12: 10 mg via INTRAVENOUS

## 2024-02-12 MED ORDER — PROPOFOL 10 MG/ML IV BOLUS
INTRAVENOUS | Status: DC | PRN
  Administered 2024-02-12: 150 mg via INTRAVENOUS

## 2024-02-12 MED ORDER — BUPIVACAINE-EPINEPHRINE 0.25% -1:200000 IJ SOLN
INTRAMUSCULAR | Status: DC | PRN
  Administered 2024-02-12: 9 mL

## 2024-02-12 MED ORDER — PHENYLEPHRINE HCL-NACL 20-0.9 MG/250ML-% IV SOLN
INTRAVENOUS | Status: DC | PRN
  Administered 2024-02-12: 40 ug/min via INTRAVENOUS

## 2024-02-12 MED ORDER — ENSURE PRE-SURGERY PO LIQD: 296.0000 mL | Freq: Once | ORAL | Status: DC

## 2024-02-12 MED ORDER — LACTATED RINGERS IV SOLN: INTRAVENOUS | Status: DC

## 2024-02-12 MED ORDER — CHLORHEXIDINE GLUCONATE CLOTH 2 % EX PADS: 6.0000 | MEDICATED_PAD | Freq: Once | CUTANEOUS | Status: DC

## 2024-02-12 MED ORDER — CEFAZOLIN SODIUM-DEXTROSE 2-4 GM/100ML-% IV SOLN
INTRAVENOUS | Status: AC
  Filled 2024-02-12: qty 100

## 2024-02-12 MED ORDER — ACETAMINOPHEN 500 MG PO TABS: 1000.0000 mg | ORAL_TABLET | ORAL | Status: AC

## 2024-02-12 MED ORDER — OXYCODONE HCL 5 MG PO TABS
5.0000 mg | ORAL_TABLET | Freq: Once | ORAL | Status: AC | PRN
  Administered 2024-02-12: 5 mg via ORAL

## 2024-02-12 MED ORDER — SUGAMMADEX SODIUM 200 MG/2ML IV SOLN
INTRAVENOUS | Status: DC | PRN
  Administered 2024-02-12: 125 mg via INTRAVENOUS

## 2024-02-12 MED ORDER — MIDAZOLAM HCL 2 MG/2ML IJ SOLN
INTRAMUSCULAR | Status: DC | PRN
  Administered 2024-02-12: 2 mg via INTRAVENOUS

## 2024-02-12 MED ORDER — FENTANYL CITRATE (PF) 250 MCG/5ML IJ SOLN
INTRAMUSCULAR | Status: AC
  Filled 2024-02-12: qty 5

## 2024-02-12 MED ORDER — 0.9 % SODIUM CHLORIDE (POUR BTL) OPTIME
TOPICAL | Status: DC | PRN
  Administered 2024-02-12: 1000 mL

## 2024-02-12 MED ORDER — PROPOFOL 10 MG/ML IV BOLUS
INTRAVENOUS | Status: AC
  Filled 2024-02-12: qty 20

## 2024-02-12 MED ORDER — AMISULPRIDE (ANTIEMETIC) 5 MG/2ML IV SOLN: 10.0000 mg | Freq: Once | INTRAVENOUS | Status: DC | PRN

## 2024-02-12 MED ORDER — MIDAZOLAM HCL 2 MG/2ML IJ SOLN
INTRAMUSCULAR | Status: AC
  Filled 2024-02-12: qty 2

## 2024-02-12 MED ORDER — OXYCODONE HCL 5 MG PO TABS: 5.0000 mg | ORAL_TABLET | Freq: Three times a day (TID) | ORAL | 0 refills | Status: DC | PRN

## 2024-02-12 MED ORDER — ONDANSETRON HCL 4 MG/2ML IJ SOLN
INTRAMUSCULAR | Status: DC | PRN
  Administered 2024-02-12: 4 mg via INTRAVENOUS

## 2024-02-12 MED ORDER — OXYCODONE HCL 5 MG PO TABS
ORAL_TABLET | ORAL | Status: AC
  Filled 2024-02-12: qty 1

## 2024-02-12 MED ORDER — ORAL CARE MOUTH RINSE: 15.0000 mL | Freq: Once | OROMUCOSAL | Status: AC

## 2024-02-12 MED ORDER — ROCURONIUM BROMIDE 10 MG/ML (PF) SYRINGE
PREFILLED_SYRINGE | INTRAVENOUS | Status: AC
  Filled 2024-02-12: qty 10

## 2024-02-12 MED ORDER — ACETAMINOPHEN 500 MG PO TABS
ORAL_TABLET | ORAL | Status: AC
  Administered 2024-02-12: 1000 mg via ORAL
  Filled 2024-02-12: qty 2

## 2024-02-12 MED ORDER — LIDOCAINE 2% (20 MG/ML) 5 ML SYRINGE
INTRAMUSCULAR | Status: AC
  Filled 2024-02-12: qty 5

## 2024-02-12 MED ORDER — DEXAMETHASONE SODIUM PHOSPHATE 10 MG/ML IJ SOLN
INTRAMUSCULAR | Status: DC | PRN
  Administered 2024-02-12: 10 mg via INTRAVENOUS

## 2024-02-12 MED ORDER — DEXAMETHASONE SODIUM PHOSPHATE 10 MG/ML IJ SOLN
INTRAMUSCULAR | Status: AC
  Filled 2024-02-12: qty 1

## 2024-02-12 MED ORDER — CHLORHEXIDINE GLUCONATE 0.12 % MT SOLN: 15.0000 mL | Freq: Once | OROMUCOSAL | Status: AC

## 2024-02-12 SURGICAL SUPPLY — 36 items
BAG COUNTER SPONGE SURGICOUNT (BAG) ×1 IMPLANT
BLADE CLIPPER SURG (BLADE) IMPLANT
CANISTER SUCTION 3000ML PPV (SUCTIONS) IMPLANT
CHLORAPREP W/TINT 26 (MISCELLANEOUS) ×1 IMPLANT
COVER SURGICAL LIGHT HANDLE (MISCELLANEOUS) ×1 IMPLANT
DERMABOND ADVANCED .7 DNX12 (GAUZE/BANDAGES/DRESSINGS) ×1 IMPLANT
DRAIN PENROSE .5X12 LATEX STL (DRAIN) IMPLANT
DRAPE LAPAROSCOPIC ABDOMINAL (DRAPES) ×1 IMPLANT
ELECTRODE REM PT RTRN 9FT ADLT (ELECTROSURGICAL) ×1 IMPLANT
GAUZE 4X4 16PLY ~~LOC~~+RFID DBL (SPONGE) ×1 IMPLANT
GLOVE BIO SURGEON STRL SZ7.5 (GLOVE) ×2 IMPLANT
GLOVE BIOGEL PI IND STRL 8 (GLOVE) ×1 IMPLANT
GOWN STRL REUS W/ TWL LRG LVL3 (GOWN DISPOSABLE) ×1 IMPLANT
GOWN STRL REUS W/ TWL XL LVL3 (GOWN DISPOSABLE) ×1 IMPLANT
KIT BASIN OR (CUSTOM PROCEDURE TRAY) ×1 IMPLANT
KIT TURNOVER KIT B (KITS) ×1 IMPLANT
MESH ULTRAPRO 3X6 7.6X15CM (Mesh General) IMPLANT
NDL HYPO 25GX1X1/2 BEV (NEEDLE) ×1 IMPLANT
NEEDLE HYPO 25GX1X1/2 BEV (NEEDLE) ×1 IMPLANT
PACK GENERAL/GYN (CUSTOM PROCEDURE TRAY) ×1 IMPLANT
PAD ARMBOARD POSITIONER FOAM (MISCELLANEOUS) ×2 IMPLANT
PENCIL SMOKE EVACUATOR (MISCELLANEOUS) ×1 IMPLANT
SOLN 0.9% NACL 1000 ML (IV SOLUTION) ×1 IMPLANT
SOLN 0.9% NACL POUR BTL 1000ML (IV SOLUTION) ×1 IMPLANT
SPONGE INTESTINAL PEANUT (DISPOSABLE) IMPLANT
SUT MNCRL AB 4-0 PS2 18 (SUTURE) ×1 IMPLANT
SUT PROLENE 2 0 SH DA (SUTURE) ×1 IMPLANT
SUT SILK 0 TIES 10X30 (SUTURE) ×1 IMPLANT
SUT VIC AB 2-0 SH 27X BRD (SUTURE) ×1 IMPLANT
SUT VIC AB 3-0 SH 27XBRD (SUTURE) ×1 IMPLANT
SUT VICRYL AB 2 0 TIES (SUTURE) ×1 IMPLANT
SYR CONTROL 10ML LL (SYRINGE) ×1 IMPLANT
SYRINGE TOOMEY DISP (SYRINGE) ×1 IMPLANT
TOWEL GREEN STERILE (TOWEL DISPOSABLE) ×1 IMPLANT
TOWEL GREEN STERILE FF (TOWEL DISPOSABLE) ×1 IMPLANT
TRAY FOL W/BAG SLVR 16FR STRL (SET/KITS/TRAYS/PACK) IMPLANT

## 2024-02-12 NOTE — Anesthesia Procedure Notes (Signed)
 Procedure Name: Intubation Date/Time: 02/12/2024 8:06 AM  Performed by: Claudene Florina Boga, CRNAPre-anesthesia Checklist: Patient identified, Emergency Drugs available, Suction available and Patient being monitored Patient Re-evaluated:Patient Re-evaluated prior to induction Oxygen Delivery Method: Circle System Utilized Preoxygenation: Pre-oxygenation with 100% oxygen Induction Type: IV induction Ventilation: Mask ventilation without difficulty Laryngoscope Size: Mac and 3 Grade View: Grade I Tube type: Oral Tube size: 7.5 mm Number of attempts: 1 Airway Equipment and Method: Stylet Placement Confirmation: ETT inserted through vocal cords under direct vision, positive ETCO2 and breath sounds checked- equal and bilateral Secured at: 22 cm Tube secured with: Tape Dental Injury: Teeth and Oropharynx as per pre-operative assessment

## 2024-02-12 NOTE — Op Note (Addendum)
 02/12/2024  8:59 AM  PATIENT:  Andrew Townsend  73 y.o. male  PRE-OPERATIVE DIAGNOSIS:  RIGHT FEMORAL HERNIA  POST-OPERATIVE DIAGNOSIS:  RIGHT FEMORAL HERNIA  PROCEDURE:  Procedure(s): OPEN RIGHT FEMORAL HERNIA REPAIR WITH MESH (Right)  SURGEON:  Surgeons and Role:    DEWAINE Rubin Calamity, MD - Primary  ASSISTANTS: none   ANESTHESIA:   local and general  EBL:  10 mL   BLOOD ADMINISTERED:none  DRAINS: none   LOCAL MEDICATIONS USED:  BUPIVICAINE   SPECIMEN:  No Specimen  DISPOSITION OF SPECIMEN:  N/A  COUNTS:  YES  TOURNIQUET:  * No tourniquets in log *  DICTATION: .Dragon Dictation  Details of the procedure: The patient was taken back to the operating room. The patient was placed in supine position with bilateral SCDs in place. The patient was prepped and draped in the usual sterile fashion.  After appropriate anitbiotics were confirmed, a time-out was confirmed and all facts were verified.  Quarter percent Marcaine  was used to infiltrate the area of the incision.  A 4 cm incision was made just over the area of the femoral hernia.  Dissection was taken down to the hernia sac using cautery.  This was dissected from the surrounding structures.  The abductor muscles were easily seen.  The hernia sac was cleaned away from the surrounding fascia.  This was reduced back into the abdominal cavity.  At this time a piece of UltraPro mesh was shaped into a cylinder and placed into the femoral canal.  This was anchored superficially, medially and laterally to the fascia as well as the ilio pubic tract.  There was an area of weakness just lateral to this area.  This was reinforced using a 2-0 Prolene in a running fashion.  This reapproximated the greater fascia as well as the iliopubic tract.  There was a small release of the external oblique superficial to the iliopubic tract.  At this time the area is injected with quarter percent Marcaine .  Scarpa's fascia was reapproximated using 3-0 Vicryl in  a standard running fashion.  The skin was then reapproximated with 4 Monocryl in a subcuticular fashion. The skin was then dressed with Dermabond.  The patient was taken to the recovery room in stable condition.   PLAN OF CARE: Discharge to home after PACU  PATIENT DISPOSITION:  PACU - hemodynamically stable.   Delay start of Pharmacological VTE agent (>24hrs) due to surgical blood loss or risk of bleeding: not applicable

## 2024-02-12 NOTE — Interval H&P Note (Signed)
 History and Physical Interval Note:  02/12/2024 7:37 AM  Andrew Townsend  has presented today for surgery, with the diagnosis of RIGHT FEMORAL HERNIA.  The various methods of treatment have been discussed with the patient and family. After consideration of risks, benefits and other options for treatment, the patient has consented to  Procedure(s) with comments: REPAIR, HERNIA, INGUINAL, ADULT (Right) - WITH MESH as a surgical intervention.  The patient's history has been reviewed, patient examined, no change in status, stable for surgery.  I have reviewed the patient's chart and labs.  Questions were answered to the patient's satisfaction.     Aowyn Rozeboom

## 2024-02-12 NOTE — Anesthesia Procedure Notes (Signed)
 Anesthesia Regional Block: TAP block   Pre-Anesthetic Checklist: , timeout performed,  Correct Patient, Correct Site, Correct Laterality,  Correct Procedure, Correct Position, site marked,  Risks and benefits discussed,  Surgical consent,  Pre-op evaluation,  At surgeon's request and post-op pain management  Laterality: Right  Prep: chloraprep       Needles:  Injection technique: Single-shot  Needle Type: Echogenic Stimulator Needle     Needle Length: 9cm  Needle Gauge: 21     Additional Needles:   Procedures:,,,, ultrasound used (permanent image in chart),,    Narrative:  Start time: 02/12/2024 8:07 AM End time: 02/12/2024 8:10 AM Injection made incrementally with aspirations every 5 mL.  Performed by: Personally  Anesthesiologist: Peggye Delon Brunswick, MD  Additional Notes: Discussed risks and benefits of nerve block including, but not limited to, prolonged and/or permanent nerve injury involving sensory and/or motor function. Monitors were applied and a time-out was performed. The nerve and associated structures were visualized under ultrasound guidance. After negative aspiration, local anesthetic was slowly injected around the nerve. There was no evidence of high pressure during the procedure. There were no paresthesias. VSS remained stable and the patient tolerated the procedure well.

## 2024-02-12 NOTE — Discharge Instructions (Signed)

## 2024-02-12 NOTE — Anesthesia Postprocedure Evaluation (Signed)
 Anesthesia Post Note  Patient: Andrew Townsend  Procedure(s) Performed: OPEN RIGHT INGUINAL HERNIA WITH MESH (Right: Abdomen)     Patient location during evaluation: PACU Anesthesia Type: General Level of consciousness: awake Pain management: pain level controlled Vital Signs Assessment: post-procedure vital signs reviewed and stable Respiratory status: spontaneous breathing, nonlabored ventilation and respiratory function stable Cardiovascular status: blood pressure returned to baseline and stable Postop Assessment: no apparent nausea or vomiting Anesthetic complications: no   No notable events documented.  Last Vitals:  Vitals:   02/12/24 0930 02/12/24 0945  BP: (!) 145/59 (!) 150/75  Pulse: (!) 58 (!) 56  Resp: 15 12  Temp:  36.4 C  SpO2: 96% 96%    Last Pain:  Vitals:   02/12/24 0915  TempSrc:   PainSc: Asleep                 Delon Aisha Arch

## 2024-02-12 NOTE — Transfer of Care (Signed)
 Immediate Anesthesia Transfer of Care Note  Patient: Andrew Townsend  Procedure(s) Performed: OPEN RIGHT INGUINAL HERNIA WITH MESH (Right: Abdomen)  Patient Location: PACU  Anesthesia Type:General  Level of Consciousness: drowsy  Airway & Oxygen Therapy: Patient Spontanous Breathing and Patient connected to face mask oxygen  Post-op Assessment: Report given to RN and Post -op Vital signs reviewed and stable  Post vital signs: Reviewed and stable  Last Vitals:  Vitals Value Taken Time  BP 125/57 02/12/24 09:15  Temp    Pulse 52 02/12/24 09:17  Resp 13 02/12/24 09:17  SpO2 100 % 02/12/24 09:17  Vitals shown include unfiled device data.  Last Pain:  Vitals:   02/12/24 0659  TempSrc:   PainSc: 0-No pain         Complications: No notable events documented.

## 2024-02-13 ENCOUNTER — Encounter (HOSPITAL_COMMUNITY): Payer: Self-pay | Admitting: General Surgery

## 2024-02-13 ENCOUNTER — Ambulatory Visit (HOSPITAL_BASED_OUTPATIENT_CLINIC_OR_DEPARTMENT_OTHER): Admitting: Physical Therapy

## 2024-02-18 ENCOUNTER — Encounter (HOSPITAL_BASED_OUTPATIENT_CLINIC_OR_DEPARTMENT_OTHER)

## 2024-02-20 ENCOUNTER — Ambulatory Visit (HOSPITAL_BASED_OUTPATIENT_CLINIC_OR_DEPARTMENT_OTHER): Admitting: Physical Therapy

## 2024-02-21 NOTE — Progress Notes (Signed)
 Remote ICD Transmission

## 2024-02-25 ENCOUNTER — Telehealth: Payer: Self-pay | Admitting: Gastroenterology

## 2024-02-25 NOTE — Telephone Encounter (Signed)
 Good afternoon Dr. Stacia,   DOD for today 10/7 PM  Patient called stating that his PCP office had sent over a referral for him to have a repeat colonoscopy. Patient states that his last colonoscopy was in 2021. Patient states that he is not currently having any issues and is requesting the transfer because his PCP recommended our practice to him. Patients records are in epic.  Will you please review and advise on scheduling patient?  Thank you.

## 2024-02-27 ENCOUNTER — Ambulatory Visit (HOSPITAL_BASED_OUTPATIENT_CLINIC_OR_DEPARTMENT_OTHER): Attending: Family Medicine | Admitting: Physical Therapy

## 2024-02-27 ENCOUNTER — Encounter (HOSPITAL_BASED_OUTPATIENT_CLINIC_OR_DEPARTMENT_OTHER): Payer: Self-pay | Admitting: Physical Therapy

## 2024-02-27 DIAGNOSIS — M25572 Pain in left ankle and joints of left foot: Secondary | ICD-10-CM | POA: Insufficient documentation

## 2024-02-27 DIAGNOSIS — R2689 Other abnormalities of gait and mobility: Secondary | ICD-10-CM | POA: Insufficient documentation

## 2024-02-27 DIAGNOSIS — M25562 Pain in left knee: Secondary | ICD-10-CM | POA: Insufficient documentation

## 2024-02-27 DIAGNOSIS — M5459 Other low back pain: Secondary | ICD-10-CM | POA: Insufficient documentation

## 2024-02-27 DIAGNOSIS — R262 Difficulty in walking, not elsewhere classified: Secondary | ICD-10-CM | POA: Insufficient documentation

## 2024-02-27 DIAGNOSIS — M76822 Posterior tibial tendinitis, left leg: Secondary | ICD-10-CM | POA: Insufficient documentation

## 2024-02-27 DIAGNOSIS — M62562 Muscle wasting and atrophy, not elsewhere classified, left lower leg: Secondary | ICD-10-CM | POA: Insufficient documentation

## 2024-02-27 DIAGNOSIS — M6281 Muscle weakness (generalized): Secondary | ICD-10-CM | POA: Insufficient documentation

## 2024-02-27 NOTE — Therapy (Signed)
 Pt with scheduled appt today for aquatics presents with questions if he should get into pool post right inguinal hernia surgery 2 weeks ago.  He has not yet seen md for post op visit.  Hospital DC summary does not indicate return to therapy.  I checked incision which has been glued.  Appears healing well and without infection. Minor scabbing present. He decides with my agreement to wait on getting into pool until after seeing MD.  His next appts for therapy are land based.  A new referral has been attached with similar dx from initial. End of cert is 89/82.  He will message MD to schedule a post op appt (doesn't have one yet) and ask for release for PT.

## 2024-02-28 NOTE — Telephone Encounter (Signed)
 Patient has been scheduled for 03/06/2024 with Ellouise Console.

## 2024-03-03 ENCOUNTER — Ambulatory Visit (HOSPITAL_BASED_OUTPATIENT_CLINIC_OR_DEPARTMENT_OTHER): Payer: Self-pay | Admitting: Physical Therapy

## 2024-03-03 ENCOUNTER — Encounter (HOSPITAL_BASED_OUTPATIENT_CLINIC_OR_DEPARTMENT_OTHER): Admitting: Physical Therapy

## 2024-03-03 ENCOUNTER — Encounter (HOSPITAL_BASED_OUTPATIENT_CLINIC_OR_DEPARTMENT_OTHER): Payer: Self-pay | Admitting: Physical Therapy

## 2024-03-03 DIAGNOSIS — M25572 Pain in left ankle and joints of left foot: Secondary | ICD-10-CM

## 2024-03-03 DIAGNOSIS — M5459 Other low back pain: Secondary | ICD-10-CM | POA: Diagnosis not present

## 2024-03-03 DIAGNOSIS — R2689 Other abnormalities of gait and mobility: Secondary | ICD-10-CM | POA: Diagnosis not present

## 2024-03-03 DIAGNOSIS — M6281 Muscle weakness (generalized): Secondary | ICD-10-CM | POA: Diagnosis not present

## 2024-03-03 DIAGNOSIS — M25562 Pain in left knee: Secondary | ICD-10-CM

## 2024-03-03 DIAGNOSIS — R262 Difficulty in walking, not elsewhere classified: Secondary | ICD-10-CM | POA: Diagnosis not present

## 2024-03-03 DIAGNOSIS — M62562 Muscle wasting and atrophy, not elsewhere classified, left lower leg: Secondary | ICD-10-CM | POA: Diagnosis not present

## 2024-03-03 DIAGNOSIS — M76822 Posterior tibial tendinitis, left leg: Secondary | ICD-10-CM | POA: Diagnosis not present

## 2024-03-03 NOTE — Therapy (Addendum)
 OUTPATIENT PHYSICAL THERAPY LOWER EXTREMITY TREATMENT  Patient Name: Andrew Townsend MRN: [id number] DOB:20-Oct-1950, 73 y.o., male Today's Date: 03/03/2024  END OF SESSION:  PT End of Session - 03/03/24 1109     Visit Number 17    Number of Visits 33   Date for Recertification  04/28/2024   Authorization Type [employer] MCR    PT Start Time 1103    PT Stop Time 1145    PT Time Calculation (min) 42 min    Activity Tolerance Patient tolerated treatment well    Behavior During Therapy [facility] for tasks assessed/performed            Progress Note Reporting Period 01/22/2024 to 03/03/2024  See note below for Objective Data and Assessment of Progress/Goals.         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)    Medtronic   Myocardial infarction (HCC)    Stroke (HCC)    Past Surgical History:  Procedure Laterality Date   arm surgery Right    broken - pins placed but now removed   COLONOSCOPY     CORONARY ARTERY BYPASS GRAFT     in [place]   EYE SURGERY Bilateral    remove cataracts   HERNIA REPAIR     ICD IMPLANT     Medtronic   INGUINAL HERNIA REPAIR Bilateral 08/19/2023   Procedure: OPEN BILATERAL INGUINAL HERNIA REPAIR WITH MESH;  Surgeon: Rubin Calamity, MD;  [facility] OR;  Service: General;  Laterality: Bilateral;   INGUINAL HERNIA REPAIR Right 02/12/2024   Procedure: OPEN RIGHT INGUINAL HERNIA  WITH MESH;  Surgeon: Rubin Calamity, MD;  Location: [facility] OR;  Service: General;  Laterality: Right;   JOINT REPLACEMENT Right    shoulder in [place]   MANDIBLE SURGERY     lower jaw surgery for placement of dentures per patient   MULTIPLE TOOTH EXTRACTIONS     all teeth pulled - dentures   TONSILLECTOMY     UPPER GI ENDOSCOPY     WISDOM TOOTH EXTRACTION     Patient Active Problem List   Diagnosis Date Noted   Tibial plateau fracture, left 11/19/2023   COVID 07/22/2023   Bilateral recurrent inguinal hernia without obstruction or gangrene 05/09/2023   Lumbar radiculopathy 03/26/2023   Diplopia 06/26/2022   Hyperkalemia 04/03/2022   Paroxysmal atrial fibrillation (HCC) 02/11/2022   Cerebral infarction, unspecified (HCC) 01/30/2022   Other idiopathic scoliosis, thoracolumbar region 12/21/2021   Arthritis of sacroiliac joint of both sides 12/21/2021   Piriformis syndrome of both sides 12/21/2021   Hx of shoulder surgery (left rotator cuff surgery, right rotator cuff then reverse total shoulder arthoplasty) 12/07/2021   Lumbar facet arthropathy 12/07/2021   Bilateral primary osteoarthritis of knee 12/07/2021   Sacroiliac joint pain 12/07/2021   Gastroesophageal reflux disease 10/27/2021   S/P CABG (coronary artery  bypass graft) 05/26/2021   ICD (implantable cardioverter-defibrillator) in place 05/26/2021   COPD (chronic obstructive pulmonary disease) (HCC) 05/26/2021   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   Insomnia 05/26/2021   Atherosclerotic heart disease of native coronary artery with unspecified angina pectoris 05/26/2021   BPH (benign prostatic hyperplasia) 03/25/2021   Stage 3b chronic kidney disease (HCC) 06/16/2018   Hypertension 04/03/2018   Hyperlipidemia 04/03/2018   Chronic pain 04/03/2018   Chronic systolic congestive heart failure (HCC) 04/03/2018   Gout 04/03/2018   Renal artery stenosis 04/03/2018   Thrombocytopenia 04/03/2018   Anemia in chronic kidney disease  04/03/2018   Ischemic dilated cardiomyopathy (HCC) 06/29/2016   VT (ventricular tachycardia) (HCC) 06/28/2016    PCP:  Chandra Toribio POUR, MD     REFERRING PROVIDER:  Chandra Toribio POUR, MD  12/24/23 LLE : Dr Elspeth Parker   REFERRING DIAG:  M54.16 (ICD-10-CM) - Lumbar radiculopathy  8/5/25Closed fx of left tibeal plateau    Rationale for Evaluation and Treatment: Rehabilitation  THERAPY DIAG:  Acute pain of left knee  Muscle weakness (generalized)  Other low back pain  ONSET DATE: >10 yrs  SUBJECTIVE:                                                                                                                                                                                           SUBJECTIVE STATEMENT: The patient returns to therapy following a  hernia operation. He will be seeing his MD tomorrow for follow up for the inguinal procedure. He returns to PT with a script for his foot. He has had continuous pain in his foot since his fall. He reports significant numbness and tingling into his foot.  He reports the numbness comes and goes frequently. There is no constant trigger to it. It has been numb since his fall off the ladder.    Eval: Pt reports LBP and R buttocks pain. Injections into the back and piriformis are hit or miss in terms of relief. They are too expensive. Pt does have a [employer] membership. Pt has been dealing with this condition for quite some time with multiple episodes of PT and chiro. Pain will down into the posterior and will be just above the knee and will start having R calf cramps. Pt is unable to walk a mile like he used to , he is only down to .25 miles now with shooting. Pt is limited to 15 mins of sitting before pain starts. Denies cancer red flags.   PERTINENT HISTORY:  CHF, CAD, COPD, HTN, CKD, recent hernia repair, R TSA,   PAIN:  Are you having pain? Yes: NPRS scale:  6-7/10 consistently Pain location: R QL, middle lower back, sacral/glute on R>  L Pain description: sharp Aggravating factors: extension, sitting for too long,   Relieving factors: chiro, resting, meds, movement, lumbar support    Are you having pain? Yes: NPRS scale: current 2/10 Pain location: Left knee Pain description: achy Aggravating factors: movement and weight bearing Relieving factors: resting , meds, ice  PRECAUTIONS: ICD/Pacemaker last shocked registered on Oct 14, 2023  RED FLAGS: None   WEIGHT BEARING RESTRICTIONS: No WBAT 12/16/23  FALLS:  Has patient fallen in last 6 months? Yes. Number of falls 3, Off ladder; off bed; in parking garage  LIVING ENVIRONMENT: Lives with: lives with their family, sister Lives in: House/apartment Stairs: to enter Has following equipment at home: has walking stick and walker available at home, does not use   OCCUPATION: retired   PLOF: Independent with basic ADLs  PATIENT GOALS: relieve back pain; wants to hike, wants to build out his mini camper to travel   12/24/23: heal to finish his mini camper and travel  OBJECTIVE:  Note: Objective measures were completed at Evaluation unless otherwise noted.  DIAGNOSTIC FINDINGS:  CT left knee from 11/09/2023 Lateral tibial plateau fracture with evidence of some impaction but no significantly displaced fragments.  Chondrocalcinosis noted within the medial and lateral joint spaces.    Moderate atherosclerotic calcifications within the abdominal aorta. A vascular stent overlies the L1-2 disc level.   IMPRESSION: 1. Moderate to severe levocurvature centered at L2. 2. Severe multilevel degenerative disc and endplate changes. 3. Mild retrolisthesis of L1 on L2.  PATIENT SURVEYS:  ODI: ODI Score = 21 points (42%) 9/3 = 23 10/14 18/50 ( 36% disability( Interpretation: 41% to 60% (severe disability): Pain is a primary problem for these patients, but they may also be experiencing significant problems in travel, personal care, social life, sexual activity and sleep. A  detailed evaluation is appropriate.  10/14   12/24/23 LEFS: 30/80 9/3 = 44/80 10/14 52/80     COGNITION:   Overall cognitive status: WFL              SENSATION: NT into R thigh, neuropathy into distal LE      L knee in knee immobilizer  10/14: numbness and tingling left ankle and foot   LOWER EXTREMITY ROM:   Active ROM Right eval Left eval Left  01/22/24 Left 10/14   Hip flexion        Hip extension        Hip abduction        Hip adduction        Hip internal rotation        Hip external rotation        Knee flexion   100 120d 125  Knee extension   -15 -9 active -7 prior to manual therapy 0 after   Ankle dorsiflexion   Limited by edema  15   Ankle plantarflexion   Limited by edema 40   Ankle inversion       Ankle eversion         (Blank rows = not tested)  LOWER EXTREMITY MMT:    MMT Right eval Left eval  Hip flexion    Hip extension    Hip abduction    Hip adduction    Hip internal rotation    Hip external rotation    Knee flexion    Knee extension    Ankle dorsiflexion    Ankle plantarflexion  N/T  Ankle inversion  3+  Ankle eversion  3+   (Blank rows = not tested)             GAIT:  Ambulating without device: lateral shift away from left LE    Observation: scoliotic curves noted in lumbar and thoracic spine    Palpation:  Area of tenderness in the navicular area  Medial calf tightness and soreness     [facility] Adult PT Treatment:                                                DATE:  10/14 Manual: Trigger point release to medial gastroc and trigger point release to medial arch  Grade I and II PA and AP mobilizations for extension  Review of self soft tissue mobilization    Performed re- assessment on patients ankle / lower leg      02/10/24 Calf stretch ins tanding 20sec x3L Partial squats at rail x15 Side stepping along rail x2laps L knee PROM Roller to L calf, STM Supine SLR (low range) x15L Sidelying hip abduction  2x10 bil LE Standing HR 2x10 DL Instructed in self IASTM to R hip and L calf  01/31/24 Pt seen for aquatic therapy today.  Treatment took place in water 3.5-4.75 ft in depth at the [facility] pool. Temp of water was 91.  Pt entered/exited the pool via stairs with heavy ue support then exits via lift   *Unsupported walking forward/ backward with cues for heel/toe and toe/heel pattern * unsupported side stepping  *forward march with kick  * UE support yellow HB:  heel/toe raises x 15 *toe walking forward then back *heel walking forward then back *HB carry RBHB forward and back unilaterally then bilaterally 3.43ft * L stretch at pool wall / trunk ext stretch 3.6 ft * UE on yellow hand floats: tandem gait forward/ backward 3.6 ft * plank position with hands on bench in water and alternating hip ext *seated: LAQ *forward step up leading R/L 2 x 5 * staggered then wide stance with solid black noodle pull down to thighs for core engagement x 5-10 * return to walking forward/backward with UE unsupported    9/15  Calf stretch with quad set 3s 2x10 Knee flexion gapping 2x10 2s LAQ GTB 3x10 GTB HS curl 3x10 Bridge with blue band 3x10 2 lateral step down 3x8 Standing HR 2x20 DL     [facility] Adult PT Treatment:                                                DATE: 01/29/24 Pt seen for aquatic therapy today.  Treatment took place in water 3.5-4.75 ft in depth at the [facility] pool. Temp of water was 91.  Pt entered/exited the pool via stairs with heavy ue support then exits via lift    *Unsupported walking forward, backward and side stepping in 3.6 -4.0 - multiple laps of each *step ups onto bottom step leading R/L 2 x 5.  Cues for TKE left with rising. (Some discomfort initially but reduces with reps) *lateral step ups left x 8 *alternating step downs 2 x 5 (no pain) *L stretch *forward walking with small knee kick *Solid noodle pull down  for TrA engagement wide  stance then staggered x 10     PATIENT EDUCATION:    Education details: exercise rationale /progression/ modification, DOMS expectations, muscle firing Person educated: Patient Education method: Programmer, Multimedia, Demonstration, Verbal cues,  Education comprehension: verbalized understanding, returned demonstration, and verbal cues required   HOME EXERCISE PROGRAM:    Access Code: [email or web address] URL: [email or web address] Date: 10/29/2023 Prepared by: Dale Call  Aquatic TBA   Access Code: [facility] URL: [email or web address] Date: 12/24/2023 Prepared by: Matilda Kohut   Exercises - Seated Quad Set  - 3-5 x daily - 7 x weekly - 1 sets - 5-10 reps - Long Sitting Quad Set  - 3-5 x daily - 7 x weekly - 1 sets - 5-10 reps - Supine Hip Abduction  - 3-5 x daily - 7 x weekly - 3-5 sets - 5-10 reps - Straight Leg Raise  - 3-5 x daily - 7 x weekly - 3-5 sets - 5-10 reps     ASSESSMENT:   CLINICAL IMPRESSION: Patient returns to physical therapy following a abdominal procedure.  He will see his abdominal doctor tomorrow.  He is advised to ask his doctor when he can return to the pool.  He comes in today with a new prescription for his lower leg.  He has significant numbness at different times throughout the day as well as tingling in his foot.  He has had this since his recent fall off a ladder.  He has tenderness to palpation in his medial arch and his medial gastroc/posterior tib area.  He is currently  receiving shockwave therapy to this area.  He was shown how to perform self soft tissue mobilization to this area.  He is not given exercises today.  Would like to see how he does with manual therapy to this area to see if it changes his symptoms at all.  We will add an lower extremity strengthening next visit.  He came in with a knee extension deficit.  This is likely contributing to his continued pain in his calf and foot.  After manual therapy was able to get his leg down  to the table.  He is advised to try to maintain this over the next few days.  We reviewed self knee extension stretching.  We also reviewed areas for him to work on in his foot and calf.  Patient will benefit from continued skilled therapy to improve ability to ambulate.  He feels at this time his ambulation distance is still very limited.  PN: pt using SPC when out of home only. He reports left foot is limiting his amb > than LB or left knee.  He has been using frozen water bottle which has provided releif for ~ 1 hour after completed to plantar aspect left foot. He reports left knee pain has improved by 85% since onset. LBP has had no measureable change. ODI demonstrates a small increase in disability likely due to addition of tibeal plateau fx and use of crutch     Initial impression (tib plat fx) Patient is a 73 y.o. m who was seen today for physical therapy evaluation and treatment for Closed fracture of lateral portion of left tibial plateau. He is WBAT as per Md as fx is stable. He has been being seen also for LBP which is now on hold due to new fx.  He presents today for aquatic therapy intervention.  He is amb with fww.  Fww height adjusted as he maneuvers 400 ft ot pool setting  without difficulty.  His fx is stabile not requiring surgical intervention. Has knee immobilizer that he is wearing when out of home. He did drive himself here today. He is now WBAT.  Reports climbing multiple steps daily to bed and bath.  Pt edu on proper technique for stair climbing using crutches. Objective testing demonstrates left knee ROM limited in both flex and extension       OBJECTIVE IMPAIRMENTS: Abnormal gait, decreased activity tolerance, decreased balance, decreased coordination, decreased endurance, decreased mobility, difficulty walking, decreased ROM, decreased strength, hypomobility, increased edema, increased fascial restrictions, impaired perceived functional ability, impaired flexibility, improper  body mechanics, and pain.    ACTIVITY LIMITATIONS: carrying, lifting, bending, sitting, standing, squatting, stairs, transfers, and locomotion level   PARTICIPATION LIMITATIONS: meal prep, cleaning, laundry, interpersonal relationship, driving, shopping, community activity, and yard work   PERSONAL FACTORS: PMH - chf, macular degeneration, DM, cervical CA are also affecting patient's functional outcome.    REHAB POTENTIAL: fair   CLINICAL DECISION MAKING: complicated/unstable   EVALUATION COMPLEXITY: moderate     GOALS:   SHORT TERM GOALS: Target date: 12/10/2023     Pt will become independent with HEP in order to demonstrate synthesis of PT education.     Goal status: Met 12/30/22    2. Pt will demonstrate at least a 12.8 improvement in Oswestry Index in order to demonstrate a clinically significant change in LBP and function.   Status: see above  Goal status: In progress -01/22/24     LONG TERM GOALS: Target date: 03/06/2024        Pt  will become independent with final HEP in order to demonstrate synthesis of PT education.    Goal status: INITIAL   2.  Pt will demonstrate at least a 25pt improvement in Oswestry Index in order to demonstrate a clinically significant change in LBP and function. Goal status: In progress 01/22/24   3.  Pt will be able to demonstrate/report ability to sit/stand/sleep for extended periods of time without pain in order to demonstrate functional improvement and tolerance to static positioning.    Goal status: In progress 02/11/24   4. Pt will be able to demonstrate/report ability to walk >25 mins without pain in order to demonstrate functional improvement and tolerance to exercise and community mobility.     Goal status: INITIAL     5. Pt to improve on LEFS by at least 9 point  (MCID)to demonstrate statistically significant Improvement in function.   Baseline: 30/80; 44/80   Goals Status: Met 01/22/24   6. Pt will improve left knee extension  to 0d and flex to at least 120d   Baseline: See chart   Goal Status: In progress 01/22/24   7. Pt will amb using SPC community distances without limitation of knee pain   Baseline: Using fww x 400 ft; progressed to single crutch   Goal status:  Met 01/22/24 (pt limited by left foot pain not knee)  8. Pt will report decrease in left knee pain by at least 75% for improved toleration to functional mobility/amb  Baseline:  Goal status: Met 02/11/24 (85)  PLAN:   PLAN:   PT FREQUENCY: 1-2x/week   PT DURATION: 6 weeks    PLANNED INTERVENTIONS: Therapeutic exercises, Therapeutic activity, Neuromuscular re-education, Balance training, Gait training, Patient/Family education, Joint manipulation, Joint mobilization, Stair training, , DME instructions, Aquatic Therapy, Dry Needling, Electrical stimulation unattended, Spinal manipulation, Spinal mobilization, Cryotherapy, Moist heat, scar mobilization, Splintting, Taping, Vasopneumatic device, Traction, Ultrasound, Ionotophoresis  4mg /ml Dexamethasone , Manual therapy.   PLAN FOR NEXT SESSION: mobilizations; weight room progression, lumbar mobility 12/24/23: aquatic: gait, balance, core and LE strengthening; stretching, pain management Trigger point release to calf and medial arch, knee extension strengthening. LE strengthening   Alm Don PT   03/03/24 11:14 AM [facility] [address] [place], [place], [address] Phone: [phone]   Fax:  [phone]

## 2024-03-04 ENCOUNTER — Encounter (HOSPITAL_BASED_OUTPATIENT_CLINIC_OR_DEPARTMENT_OTHER): Payer: Self-pay | Admitting: Physical Therapy

## 2024-03-05 ENCOUNTER — Encounter: Payer: Self-pay | Admitting: Sports Medicine

## 2024-03-05 ENCOUNTER — Ambulatory Visit: Admitting: Sports Medicine

## 2024-03-05 DIAGNOSIS — M76822 Posterior tibial tendinitis, left leg: Secondary | ICD-10-CM | POA: Diagnosis not present

## 2024-03-05 DIAGNOSIS — M25572 Pain in left ankle and joints of left foot: Secondary | ICD-10-CM

## 2024-03-05 DIAGNOSIS — M2142 Flat foot [pes planus] (acquired), left foot: Secondary | ICD-10-CM

## 2024-03-05 DIAGNOSIS — M2141 Flat foot [pes planus] (acquired), right foot: Secondary | ICD-10-CM

## 2024-03-05 NOTE — Progress Notes (Signed)
 Patient says that he got a couple of days of good relief from the shockwave therapy. He says that after those first couple of days, the tingling did return in the foot. He has been to physical therapy, and says that they found a spot on the inside of the lower leg, and another in the foot. He has been massaging these areas, which has seemed to help.

## 2024-03-05 NOTE — Progress Notes (Signed)
 Ellouise Console, PA-C [address] [place], [place]  [address] Phone: [phone]   Gastroenterology Consultation  Referring Provider:     Chandra Toribio POUR, MD Primary Care Physician:  Chandra Toribio POUR, MD Primary Gastroenterologist:  Ellouise Console, PA-C / Glendia Holt, MD  Reason for Consultation:     Discussed repeat colonoscopy; history of adenomatous and sessile serrated polyps        HPI:   Andrew Townsend is a 73 y.o. y/o male referred for consultation & management  by Chandra Toribio POUR, MD. Patient previously saw gastroenterologist in [place].  Is transferring care to Dr. Holt in our office.  Discussed the use of AI scribe software for clinical note transcription with the patient, who gave verbal consent to proceed. History of Present Illness He is here for a follow-up colonoscopy after his last procedure in April 2021 resulted in the removal of six small polyps. A repeat colonoscopy was recommended in three years, which is overdue. He denies any  gastrointestinal symptoms such as abdominal pain, diarrhea, constipation, blood in stool, unusual weight loss, heartburn, or trouble swallowing. No family history of colon cancer.  He has a significant cardiac history, including the use of the blood thinner Eliquis  and having an internal defibrillator pacemaker. No current symptoms or concerns related to his cardiac condition and does not use oxygen.  He recently underwent a femoral hernia repair surgery three weeks ago and a double inguinal hernia operation in April 2025 by Dr. Rubin.  He had followup with DR. Rubin earlier this week and is doing well.  He was cleared for a colonoscopy.  He moved from [name] to [name] about three or four years ago.  08/2019 screening [facility]oscopy at [facility] in [place]: 6 small (4 mm to 5 mm) polyps removed.  Pathology pathology showed tubular adenomas and sessile serrated polyps.  Pandiverticulosis.  Small internal  hemorrhoids.  Otherwise normal.  Excellent prep.  Significant looping in the colon.  3-year repeat was due 08/2022.  PMH: CAD s/p CABG (2008), isch. cardiomyopathy, PVD w/renal art. stenosis and stent (2019), CDKIII, claudication, bilat ing hernia repair (2010), COPD, CHF, chronic low back pain on opiates, GERD, hypertension,  internal cardioverter-defibrillator ICD, former tobacco abuse.  08/2022 Echo LVEF 50-55%.  Cardiologist Dr. Danelle Birmingham.  Patient does not use oxygen.   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)    Medtronic   Myocardial infarction (HCC)    Stroke [facility])     Past Surgical History:  Procedure Laterality Date   arm surgery Right    broken - pins placed but now removed   COLONOSCOPY     CORONARY ARTERY BYPASS GRAFT     in [place]   EYE SURGERY Bilateral    remove cataracts   HERNIA REPAIR     ICD  IMPLANT     Medtronic   INGUINAL HERNIA REPAIR Bilateral 08/19/2023   Procedure: OPEN BILATERAL INGUINAL HERNIA REPAIR WITH MESH;  Surgeon: Rubin Calamity, MD;  Location: [facility] OR;  Service: General;  Laterality: Bilateral;   INGUINAL HERNIA REPAIR Right 02/12/2024   Procedure: OPEN RIGHT INGUINAL HERNIA WITH MESH;  Surgeon: Rubin Calamity, MD;  Location: [facility] OR;  Service: General;  Laterality: Right;   JOINT REPLACEMENT Right    shoulder in [place]   MANDIBLE SURGERY     lower jaw surgery for  placement of dentures per patient   MULTIPLE TOOTH EXTRACTIONS     all teeth pulled - dentures   TONSILLECTOMY     UPPER GI ENDOSCOPY     WISDOM TOOTH EXTRACTION      Prior to Admission medications   Medication Sig Start Date End Date Taking? Authorizing Provider  albuterol  (PROVENTIL ) (2.5 MG/3ML) 0.083% nebulizer solution INHALE THE CONTENTS OF 1 VIAL VIA NEBULIZER EVERY 6 HOURS AS NEEDED FOR WHEEZING OR SHORTNESS OF BREATH. 07/09/22   Hanford Powell BRAVO, NP  albuterol  (VENTOLIN  HFA) 108 (90 Base) MCG/ACT inhaler Inhale 2 puffs into the lungs every 6 (six) hours as needed for wheezing or shortness of breath. 05/09/23   Wallace Joesph LABOR, PA  allopurinol  (ZYLOPRIM ) 100 MG tablet Take 1 tablet (100 mg total) by mouth daily. 12/24/23   Chandra Toribio POUR, MD  buprenorphine  (BUTRANS ) 20 MCG/HR PTWK Place 1 patch onto the skin once a week. 12/23/23   Patel, Seema K, NP  docusate sodium (COLACE) 100 MG capsule Take 200 mg by mouth daily.    [provider]  ELIQUIS  2.5 MG TABS tablet TAKE 1 TABLET BY MOUTH 2 TIMES DAILY. 11/20/23   Lonni Slain, MD  empagliflozin  (JARDIANCE ) 10 MG TABS tablet Take 1 tablet (10 mg total) by mouth daily before breakfast. 12/24/23   Chandra Toribio POUR, MD  ezetimibe  (ZETIA ) 10 MG tablet Take 1 tablet (10 mg total) by mouth daily. 12/24/23   Chandra Toribio POUR, MD  Fluticasone-Umeclidin-Vilant (TRELEGY ELLIPTA ) 100-62.5-25 MCG/ACT AEPB Inhale 1 puff into the lungs daily. 12/24/23   Chandra Toribio POUR, MD  furosemide  (LASIX ) 40 MG tablet Take 1 tablet (40 mg total) by mouth daily as needed for fluid. 07/31/22 02/12/24  Leverne Charlies Helling, PA-C  gabapentin  (NEURONTIN ) 600 MG tablet Take 2 tablets (1,200 mg total) by mouth at bedtime. 12/23/23 03/22/24  Patel, Seema K, NP  Magnesium Oxide (DIASENSE MAGNESIUM PO) Take 1,000 mg by mouth daily.    [provider]  metoprolol  succinate (TOPROL -XL) 100 MG 24 hr tablet Take 1 tablet (100 mg total) by mouth daily. PLEASE NOTE  DECREASED DOSE. 12/24/23 03/23/24  Chandra Toribio POUR, MD  Multiple Vitamin (MULTIVITAMIN) tablet Take 1 tablet by mouth daily.    [provider]  nitroGLYCERIN  (NITROSTAT ) 0.4 MG SL tablet Place 1 tablet (0.4 mg total) under the tongue every 5 (five) minutes as needed for chest pain. If second dose is needed to relieve chest pain, seek immediate emergency medical attention. 02/07/23   Wallace Joesph LABOR, PA  omeprazole  (PRILOSEC) 20 MG capsule Take 1 capsule (20 mg total) by mouth daily. 12/24/23   Chandra Toribio POUR, MD  oxyCODONE  (ROXICODONE ) 5 MG immediate release tablet Take 1 tablet (5 mg total) by mouth every 8 (eight) hours as needed. 02/12/24 02/11/25  Rubin Calamity, MD  oxyCODONE -acetaminophen  (PERCOCET) 5-325 MG tablet Take 1 tablet by mouth every 4 (four) hours as needed for severe pain (pain score  7-10). 02/12/24 02/11/25  Rubin Calamity, MD  potassium chloride  (KLOR-CON  M) 10 MEQ tablet Take 1 tablet (10 mEq total) by mouth daily. 12/24/23   Chandra Toribio POUR, MD  rosuvastatin  (CRESTOR ) 40 MG tablet Take 1 tablet (40 mg total) by mouth at bedtime. 12/24/23   Chandra Toribio POUR, MD  tiZANidine  (ZANAFLEX ) 4 MG tablet Take 1 tablet (4 mg total) by mouth every 6 (six) hours as needed. 12/12/23   Patel, Seema K, NP  Vibegron  (GEMTESA ) 75 MG TABS Take 1 tablet (75 mg total) by mouth daily. 09/19/23   Helon Kirsch A, PA-C  Vibegron  (GEMTESA ) 75 MG TABS Take 1 tablet (75 mg total) by mouth daily. 10/30/23   McGowan, Kirsch LABOR, PA-C  zolpidem  (AMBIEN  CR) 6.25 MG CR tablet Take 1 tablet (6.25 mg total) by mouth at bedtime as needed for sleep. 12/24/23   Chandra Toribio POUR, MD    Family History  Problem Relation Age of Onset   Colon cancer Neg Hx    Esophageal cancer Neg Hx    Pancreatic cancer Neg Hx    Prostate cancer Neg Hx      Social History   Tobacco Use   Smoking status: Former    Types: E-cigarettes, Cigarettes    Quit date: 2015    Years since quitting: 10.8   Smokeless tobacco: Current    Tobacco comments:    Quit smoking cigarettes in 2015 - 1 ppd  Vaping Use   Vaping status: Every Day   Substances: Nicotine  Substance Use Topics   Alcohol use: Not Currently   Drug use: Never    Allergies as of 03/06/2024   (No Known Allergies)    Review of Systems:    All systems reviewed and negative except where noted in HPI.   Physical Exam:  BP (!) 142/78   Pulse (!) 55   Ht 5' 6 (1.676 m)   Wt 121 lb 4 oz (55 kg)   SpO2 99%   BMI 19.57 kg/m  No LMP for male patient.  General:   Alert,  Well-developed, thin, pleasant and cooperative in NAD Lungs:  Respirations even and unlabored.  Clear throughout to auscultation.   No wheezes, crackles, or rhonchi. No acute distress. Heart:  Regular rhythm; Bradycardic; no murmurs, clicks, rubs, or gallops. Abdomen:  Normal bowel sounds.  No bruits.  Soft, and non-distended without masses, hepatosplenomegaly or hernias noted.  Mild RLQ Tenderness over a healing incision from recent femoral hernia repair.  No guarding or rebound tenderness.  Hernia incision is well healing RLQ. Neurologic:  Alert and oriented x3;  grossly normal neurologically. Psych:  Alert and cooperative. Normal mood and affect.   Imaging Studies: No results found.  Labs: CBC    Component Value Date/Time   WBC 7.9 02/07/2024 0935   RBC 4.88 02/07/2024 0935   HGB 14.5 02/07/2024 0935   HGB 13.6 10/15/2023 0912   HCT 46.6 02/07/2024 0935   HCT 43.3 10/15/2023 0912   PLT 181 02/07/2024 0935   PLT 182 10/15/2023 0912   MCV 95.5 02/07/2024 0935   MCV 94 10/15/2023 0912    CMP     Component Value Date/Time   NA 139 02/07/2024 0935   NA 142 10/18/2023 0900   K 4.6 02/07/2024 0935   CL 108 02/07/2024 0935   CO2 24 02/07/2024 0935   GLUCOSE 58 (L) 02/07/2024 0935   BUN 20 02/07/2024 0935   BUN 23 10/18/2023 0900   CREATININE 1.42 (H)  02/07/2024 0935   CALCIUM  9.1 02/07/2024 0935   PROT 6.8 04/17/2023 1027   ALBUMIN 3.9 10/18/2023 0900   AST 26  04/17/2023 1027   ALT 32 04/17/2023 1027   ALKPHOS 99 04/17/2023 1027   BILITOT 0.8 04/17/2023 1027   GFRNONAA 52 (L) 02/07/2024 0935    Assessment and Plan:   Shivam Mestas is a 73 y.o. y/o male has been referred for:   1.  History of multiple tubular adenoma and sessile serrated polyps.  Overdue for 3-year repeat surveillance colonoscopy.  No current GI symptoms. - Scheduling Colonoscopy in 4 - 6 weeks (allow more time for hernia repair healing). I discussed risks of colonoscopy with patient to include risk of bleeding, colon perforation, and risk of sedation.  Patient expressed understanding and agrees to proceed with colonoscopy.   2.  Comorbidities:  CAD s/p CABG (2008), isch. cardiomyopathy, PVD w/renal art. stenosis and stent (2019), CDKIII, claudication, bilat ing hernia repair (2010), COPD, CHF, chronic low back pain on opiates, GERD, hypertension,  internal cardioverter-defibrillator ICD, former tobacco abuse.  NOT on oxygen.  2024 Normal LVEF 50-55%.  Cardiologist Dr. Danelle Birmingham.  Not on oxygen. - Hold Eliquis  2 days before procedure, pending cardiologist approval. - Send note to cardiologist for approval to hold Eliquis  2 days before procedure.   Follow up As Needed based on Colonoscopy results.  Ellouise Console, PA-C

## 2024-03-05 NOTE — Progress Notes (Signed)
 Andrew Townsend - 73 y.o. male MRN [id number]  Date of birth: [birth year]  Office Visit Note: Visit Date: 03/05/2024 PCP: Chandra Toribio POUR, MD Referred by: Chandra Toribio POUR, MD  Subjective: Chief Complaint  Patient presents with   Left Foot - Follow-up   HPI: Andrew Townsend is a pleasant 73 y.o. male who presents today for chronic left foot and ankle pain.  Suffered a tibial plateau fracture back in June 2025, this was treated nonoperatively by Dr. Genelle.  Given time off of the foot he has developed notable atrophy of the calf and the left lower leg.  We did perform shockwave therapy back in 02/10/2024 for his posterior tibial tendon as well as he has fascial restriction in the calf musculature -did notice a few days of very good relief and then still improvement overall not 100% better.  Did have a recent physical therapy reevaluation and has been continuing this once to twice weekly.  He did bring his orthotics and that he purchased over-the-counter, he does feel these are somewhat painful given the sensitivity of his foot and leg.  Pertinent ROS were reviewed with the patient and found to be negative unless otherwise specified above in HPI.   Assessment & Plan: Visit Diagnoses:  1. Pes planus of both feet   2. Posterior tibial tendon dysfunction (PTTD) of left lower extremity   3. Pain in left ankle and joints of left foot    Plan: Impression is chronic left lower extremity and foot/ankle pain which is multifactorial.  He did have a prolonged period of immobility from a tibial plateau fracture.  This has left him with left leg and Atrophy.  He also has underlying posterior tibial dysfunction.  He also has notable pes planus with some soreness in the plantar arch and fascia itself without true plantar fasciitis.  We did proceed with extracorporeal shockwave therapy for his posterior tibial tendon, there is myofascial restriction of the As well as the underlying plantar arch.  He did bring in his  orthotics today but I do not think these are helpful for him and are quite rigid, as he finds them uncomfortable.  We will send him to the sports medicine center for both cushioned and rigid orthotic insoles to help support his longitudinal arch and offload the posterior tibial tendon.  He will continue his chronic Percocet for chronic pain as needed.  I would like to see him back in about 3 weeks as he continues his PT and HEP and reevaluate him in his orthotics.  Could consider additional shockwave therapy at that time if he finds beneficial.  Follow-up: Return in about 3 weeks (around 03/26/2024).   Meds & Orders: No orders of the defined types were placed in this encounter.   Orders Placed This Encounter  Procedures   AMB referral to sports medicine    Procedures: Procedure: ECSWT Indications:  Calf atrophy, PT tendinopathy/dysfunction   Procedure Details Consent: Risks of procedure as well as the alternatives and risks of each were explained to the patient.  Verbal consent for procedure obtained. Time Out: Verified patient identification, verified procedure, site was marked, verified correct patient position. The area was cleaned with alcohol swab.     The Posterior tibial tendon was targeted for Extracorporeal shockwave therapy.    Preset: Tendinitis Power Level: 90 mJ Frequency: 10 Hz Impulse/cycles: 2000 Head size: Regular   The left medial calf was targeted for Extracorporeal shockwave therapy.    Preset: Muscle atrophy/Restriction Power Level:  80-90 mJ Frequency: 10 Hz Impulse/cycles: 1000 Head size: Regular   Patient tolerated procedure well without immediate complications.      Clinical History: No specialty comments available.  He reports that he quit smoking about 10 years ago. His smoking use included e-cigarettes and cigarettes. He uses smokeless tobacco.  Recent Labs    10/15/23 0912  LABURIC 3.6*    Objective:   Vital Signs: There were no vitals taken  for this visit.  Physical Exam  Gen: Well-appearing, in no acute distress; non-toxic CV: Well-perfused. Warm.  Resp: Breathing unlabored on room air; no wheezing. Psych: Fluid speech in conversation; appropriate affect; normal thought process  Ortho Exam - Left leg: There is notable atrophy of the left calf.  Positive TTP along the course of the posterior tibial tendon and mildly into the plantar fascia with some fibromatosis.  Notable pes planus with complete collapse of the longitudinal arch and overpronation.  Imaging: No results found.  Past Medical/Family/Surgical/Social History: Medications & Allergies reviewed per EMR, new medications updated. Patient Active Problem List   Diagnosis Date Noted   Tibial plateau fracture, left 11/19/2023   COVID 07/22/2023   Bilateral recurrent inguinal hernia without obstruction or gangrene 05/09/2023   Lumbar radiculopathy 03/26/2023   Diplopia 06/26/2022   Hyperkalemia 04/03/2022   Paroxysmal atrial fibrillation (HCC) 02/11/2022   Cerebral infarction, unspecified (HCC) 01/30/2022   Other idiopathic scoliosis, thoracolumbar region 12/21/2021   Arthritis of sacroiliac joint of both sides 12/21/2021   Piriformis syndrome of both sides 12/21/2021   Hx of shoulder surgery (left rotator cuff surgery, right rotator cuff then reverse total shoulder arthoplasty) 12/07/2021   Lumbar facet arthropathy 12/07/2021   Bilateral primary osteoarthritis of knee 12/07/2021   Sacroiliac joint pain 12/07/2021   Gastroesophageal reflux disease 10/27/2021   S/P CABG (coronary artery bypass graft) 05/26/2021   ICD (implantable cardioverter-defibrillator) in place 05/26/2021   COPD (chronic obstructive pulmonary disease) (HCC) 05/26/2021   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   Insomnia 05/26/2021   Atherosclerotic heart disease of native coronary artery with unspecified angina pectoris 05/26/2021   BPH (benign prostatic hyperplasia) 03/25/2021   Stage 3b chronic  kidney disease (HCC) 06/16/2018   Hypertension 04/03/2018   Hyperlipidemia 04/03/2018   Chronic pain 04/03/2018   Chronic systolic congestive heart failure (HCC) 04/03/2018   Gout 04/03/2018   Renal artery stenosis 04/03/2018   Thrombocytopenia 04/03/2018   Anemia in chronic kidney disease 04/03/2018   Ischemic dilated cardiomyopathy (HCC) 06/29/2016   VT (ventricular tachycardia) (HCC) 06/28/2016   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)    Medtronic   Myocardial infarction [facility])    Stroke (HCC)    No family history on file. Past Surgical History:  Procedure Laterality Date   arm surgery Right    broken - pins placed but now removed   COLONOSCOPY  CORONARY ARTERY BYPASS GRAFT     in [place]   EYE SURGERY Bilateral    remove cataracts   HERNIA REPAIR     ICD IMPLANT     Medtronic   INGUINAL HERNIA REPAIR Bilateral 08/19/2023   Procedure: OPEN BILATERAL INGUINAL HERNIA REPAIR WITH MESH;  Surgeon: Rubin Calamity, MD;  Location: [facility] OR;  Service: General;  Laterality: Bilateral;   INGUINAL HERNIA REPAIR Right 02/12/2024   Procedure: OPEN RIGHT INGUINAL HERNIA WITH MESH;  Surgeon: Rubin Calamity, MD;  Location: [facility] OR;  Service: General;  Laterality: Right;   JOINT REPLACEMENT Right    shoulder in [place]   MANDIBLE SURGERY     lower jaw surgery for placement of dentures per patient   MULTIPLE  TOOTH EXTRACTIONS     all teeth pulled - dentures   TONSILLECTOMY     UPPER GI ENDOSCOPY     WISDOM TOOTH EXTRACTION     Social History   Occupational History   Not on file  Tobacco Use   Smoking status: Former    Types: E-cigarettes, Cigarettes    Quit date: 2015    Years since quitting: 10.7   Smokeless tobacco: Current   Tobacco comments:    Quit smoking cigarettes in 2015 - 1 ppd  Vaping Use   Vaping status: Every Day   Substances: Nicotine  Substance and Sexual Activity   Alcohol use: Not Currently   Drug use: Never   Sexual activity: Not Currently

## 2024-03-06 ENCOUNTER — Ambulatory Visit: Admitting: Physician Assistant

## 2024-03-06 ENCOUNTER — Encounter: Payer: Self-pay | Admitting: Physician Assistant

## 2024-03-06 ENCOUNTER — Encounter (HOSPITAL_BASED_OUTPATIENT_CLINIC_OR_DEPARTMENT_OTHER): Admitting: Physical Therapy

## 2024-03-06 VITALS — BP 142/78 | HR 55 | Ht 66.0 in | Wt 121.2 lb

## 2024-03-06 DIAGNOSIS — I480 Paroxysmal atrial fibrillation: Secondary | ICD-10-CM

## 2024-03-06 DIAGNOSIS — Z1211 Encounter for screening for malignant neoplasm of colon: Secondary | ICD-10-CM

## 2024-03-06 DIAGNOSIS — Z860101 Personal history of adenomatous and serrated colon polyps: Secondary | ICD-10-CM | POA: Diagnosis not present

## 2024-03-06 DIAGNOSIS — Z860100 Personal history of colon polyps, unspecified: Secondary | ICD-10-CM

## 2024-03-06 DIAGNOSIS — Z7901 Long term (current) use of anticoagulants: Secondary | ICD-10-CM

## 2024-03-06 MED ORDER — NA SULFATE-K SULFATE-MG SULF 17.5-3.13-1.6 GM/177ML PO SOLN: 1.0000 | Freq: Once | ORAL | 0 refills | Status: AC

## 2024-03-06 NOTE — Patient Instructions (Signed)
 You have been scheduled for a Colonoscopy. Please follow written instructions given to you at your visit today.   If you use inhalers (even only as needed), please bring them with you on the day of your procedure.  DO NOT TAKE 7 DAYS PRIOR TO TEST- Trulicity (dulaglutide) Ozempic, Wegovy (semaglutide) Mounjaro (tirzepatide) Bydureon Bcise (exanatide extended release)  DO NOT TAKE 1 DAY PRIOR TO YOUR TEST Rybelsus (semaglutide) Adlyxin (lixisenatide) Victoza (liraglutide) Byetta (exanatide) ___________________________________________________________________________  Please follow up sooner if symptoms increase or worsen   Due to recent changes in healthcare laws, you may see the results of your imaging and laboratory studies on MyChart before your provider has had a chance to review them.  We understand that in some cases there may be results that are confusing or concerning to you. Not all laboratory results come back in the same time frame and the provider may be waiting for multiple results in order to interpret others.  Please give us  48 hours in order for your provider to thoroughly review all the results before contacting the office for clarification of your results.   Thank you for trusting me with your gastrointestinal care!   Ellouise Console, PA-C _______________________________________________________  If your blood pressure at your visit was 140/90 or greater, please contact your primary care physician to follow up on this.  _______________________________________________________  If you are age 73 or older, your body mass index should be between 23-30. Your Body mass index is 19.57 kg/m. If this is out of the aforementioned range listed, please consider follow up with your Primary Care Provider.  If you are age 19 or younger, your body mass index should be between 19-25. Your Body mass index is 19.57 kg/m. If this is out of the aformentioned range listed, please consider  follow up with your Primary Care Provider.   ________________________________________________________  The [facility] providers would like to encourage you to use MYCHART to communicate with providers for non-urgent requests or questions.  Due to long hold times on the telephone, sending your provider a message by [facility] may be a faster and more efficient way to get a response.  Please allow 48 business hours for a response.  Please remember that this is for non-urgent requests.  _______________________________________________________

## 2024-03-09 ENCOUNTER — Telehealth: Payer: Self-pay

## 2024-03-09 ENCOUNTER — Encounter: Payer: Self-pay | Admitting: Nurse Practitioner

## 2024-03-09 ENCOUNTER — Ambulatory Visit: Attending: Nurse Practitioner | Admitting: Nurse Practitioner

## 2024-03-09 VITALS — BP 144/78 | HR 68 | Temp 97.1°F | Resp 18 | Ht 66.0 in | Wt 126.0 lb

## 2024-03-09 DIAGNOSIS — G894 Chronic pain syndrome: Secondary | ICD-10-CM | POA: Diagnosis not present

## 2024-03-09 DIAGNOSIS — G8929 Other chronic pain: Secondary | ICD-10-CM | POA: Diagnosis not present

## 2024-03-09 DIAGNOSIS — M461 Sacroiliitis, not elsewhere classified: Secondary | ICD-10-CM | POA: Insufficient documentation

## 2024-03-09 DIAGNOSIS — M5416 Radiculopathy, lumbar region: Secondary | ICD-10-CM | POA: Insufficient documentation

## 2024-03-09 DIAGNOSIS — Z79899 Other long term (current) drug therapy: Secondary | ICD-10-CM | POA: Diagnosis not present

## 2024-03-09 DIAGNOSIS — M47816 Spondylosis without myelopathy or radiculopathy, lumbar region: Secondary | ICD-10-CM | POA: Insufficient documentation

## 2024-03-09 MED ORDER — OXYCODONE-ACETAMINOPHEN 10-325 MG PO TABS: 1.0000 | ORAL_TABLET | Freq: Three times a day (TID) | ORAL | 0 refills | Status: DC | PRN

## 2024-03-09 MED ORDER — BUPRENORPHINE 20 MCG/HR TD PTWK: 1.0000 | MEDICATED_PATCH | TRANSDERMAL | 2 refills | Status: DC

## 2024-03-09 MED ORDER — OXYCODONE-ACETAMINOPHEN 10-325 MG PO TABS: 1.0000 | ORAL_TABLET | Freq: Three times a day (TID) | ORAL | 0 refills | Status: AC | PRN

## 2024-03-09 MED ORDER — NALOXONE HCL 4 MG/0.1ML NA LIQD: 1.0000 | NASAL | 1 refills | Status: AC | PRN

## 2024-03-09 NOTE — Progress Notes (Signed)
 Nursing Pain Medication Assessment:  Safety precautions to be maintained throughout the outpatient stay will include: orient to surroundings, keep bed in low position, maintain call bell within reach at all times, provide assistance with transfer out of bed and ambulation.  Medication Inspection Compliance: Pill count conducted under aseptic conditions, in front of the patient. Neither the pills nor the bottle was removed from the patient's sight at any time. Once count was completed pills were immediately returned to the patient in their original bottle.  Medication: Oxycodone /APAP Pill/Patch Count: 30 of 90 pills/patches remain Pill/Patch Appearance: Markings consistent with prescribed medication Bottle Appearance: Standard pharmacy container. Clearly labeled. Filled Date: 10 / 01 / 2025 Last Medication intake:  Today

## 2024-03-09 NOTE — Progress Notes (Signed)
 PROVIDER NOTE: Interpretation of information contained herein should be left to medically-trained personnel. Specific patient instructions are provided elsewhere under Patient Instructions section of medical record. This document was created in part using AI and STT-dictation technology, any transcriptional errors that may result from this process are unintentional.  Patient: Andrew Townsend  Service: E/M   PCP: Chandra Toribio POUR, MD  DOB: February 14, 1951  DOS: 03/09/2024  Provider: Emmy POUR Blanch, NP  MRN: [id number]  Delivery: Face-to-face  Specialty: Interventional Pain Management  Type: Established Patient  Setting: Ambulatory outpatient facility  Specialty designation: 09  Referring Prov.: Chandra Toribio POUR, MD  Location: Outpatient office facility       History of present illness (HPI) Andrew Townsend, a 73 y.o. year old male, is here today because of his Chronic radicular lumbar pain [M54.16, G89.29]. Andrew Townsend primary complain today is Back Pain (Lower Back, Down Buttocks down right leg. Has recovering broken left leg. 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 Pain Assessment: Severity of Chronic pain is reported as a 7 /10. Location: Back Lower/down buttocks and right leg. Onset: More than a month ago. Quality: Aching, Sharp. Timing: Constant. Modifying factor(s): Heat. Vitals:  height is 5' 6 (1.676 m) and weight is 126 lb (57.2 kg). His temporal temperature is 97.1 F (36.2 C) (abnormal). His blood pressure is 144/78 (abnormal) and his pulse is 68. His  respiration is 18 and oxygen saturation is 100%.  BMI: Estimated body mass index is 20.34 kg/m as calculated from the following:   Height as of this encounter: 5' 6 (1.676 m).   Weight as of this encounter: 126 lb (57.2 kg).  Last encounter: 12/12/2023. Last procedure: Visit date not found.  Reason for encounter: medication management. No change in medical history since last visit.  Patient's pain is at baseline.  Patient continues multimodal pain regimen as prescribed.  States that it provides pain relief and improvement in functional status.   Discussed the use of AI scribe software for clinical note transcription with the patient, who gave verbal consent to proceed.  History of Present Illness   Andrew Townsend is a 73 year old male who presents for pain management follow-up.  He experiences persistent lower back pain radiating to the buttocks and legs. Despite using Percocet 10-325 mg and a Butrans  patch for several years without adverse reactions, the pain continues. He has previously tried lumbar injections, which provided inconsistent relief, working two or three out of five times.  He is recovering from a left tibial plateau fracture sustained from a fall when a ladder he was on sank into the ground. He continues to experience pain related to this injury.  He uses tizanidine  (Zanaflex ) for back pain management and has a current supply with refills available.     Pharmacotherapy Assessment   Oxycodone -acetaminophen  (Percocet) 10-325 mg tablet every 8 hours as needed for pain. MME=45 Monitoring: [place] PMP: PDMP reviewed during this encounter.       Pharmacotherapy: No side-effects or adverse reactions reported. Compliance: No problems identified. Effectiveness: Clinically acceptable.  Erlene Doyal SAUNDERS, [place]  03/09/2024 10:28 AM  Sign when Signing Visit Nursing Pain  Medication Assessment:  Safety precautions to be maintained throughout the outpatient stay will include: orient to  surroundings, keep bed in low position, maintain call bell within reach at all times, provide assistance with transfer out of bed and ambulation.  Medication Inspection Compliance: Pill count conducted under aseptic conditions, in front of the patient. Neither the pills nor the bottle was removed from the patient's sight at any time. Once count was completed pills were immediately returned to the patient in their original bottle.  Medication: Buprenorphine  patch (Butrans ) Pill/Patch Count: 1 of 4 pills/patches remain Pill/Patch Appearance: Markings consistent with prescribed medication Bottle Appearance: Standard pharmacy container. Clearly labeled. Filled Date: 10 / 01 / 2025 Last Medication intake:  03/04/2024  Erlene Doyal SAUNDERS, CMA  03/09/2024 10:26 AM  Sign when Signing Visit Nursing Pain Medication Assessment:  Safety precautions to be maintained throughout the outpatient stay will include: orient to surroundings, keep bed in low position, maintain call bell within reach at all times, provide assistance with transfer out of bed and ambulation.  Medication Inspection Compliance: Pill count conducted under aseptic conditions, in front of the patient. Neither the pills nor the bottle was removed from the patient's sight at any time. Once count was completed pills were immediately returned to the patient in their original bottle.  Medication: Oxycodone /APAP Pill/Patch Count: 30 of 90 pills/patches remain Pill/Patch Appearance: Markings consistent with prescribed medication Bottle Appearance: Standard pharmacy container. Clearly labeled. Filled Date: 10 / 01 / 2025 Last Medication intake:  Today    UDS:  Summary  Date Value Ref Range Status  12/18/2022 Note  Final    Comment:    ==================================================================== ToxASSU[employer] Select 13 (MW) ==================================================================== Test                             Result        Flag       Units  Drug Present and Declared for Prescription Verification   Oxycodone                       3473         EXPECTED   ng/mg creat   Oxymorphone                    1617         EXPECTED   ng/mg creat   Noroxycodone                   4722         EXPECTED   ng/mg creat   Noroxymorphone                 992          EXPECTED   ng/mg creat    Sources of oxycodone  are scheduled prescription medications.    Oxymorphone, noroxycodone, and noroxymorphone are expected    metabolites of oxycodone . Oxymorphone is also available as a    scheduled prescription medication.    Buprenorphine                   21           EXPECTED   ng/mg creat   Norbuprenorphine               36           EXPECTED   ng/mg creat    Source of buprenorphine  is a  scheduled prescription medication.    Norbuprenorphine is an expected metabolite of buprenorphine .  ==================================================================== Test                      Result    Flag   Units      Ref Range   Creatinine              89               mg/dL      >=79 ==================================================================== Declared Medications:  The flagging and interpretation on this report are based on the  following declared medications.  Unexpected results may arise from  inaccuracies in the declared medications.   **Note: The testing scope of this panel includes these medications:   Oxycodone  (Percocet)   **Note: The testing scope of this panel does not include small to  moderate amounts of these reported medications:   Buprenorphine  Patch (BuTrans )   **Note: The testing scope of this panel does not include the  following reported medications:   Acetaminophen  (Percocet)  Albuterol   Allopurinol   Apixaban  (Eliquis )  Empagliflozin  (Jardiance )  Ezetimibe  (Zetia )  Fluticasone (Trelegy)  Furosemide  (Lasix )  Gabapentin  (Neurontin )  Magnesium (Mag-Ox)  Metoprolol   Multivitamin  Nitroglycerin   (Nitrostat )  Omeprazole   Oxybutynin   Potassium (Klor-Con )  Rosuvastatin   Tizanidine   Umeclidinium (Trelegy)  Vilanterol (Trelegy)  Zolpidem  (Ambien ) ==================================================================== For clinical consultation, please call [phone]. ====================================================================     No results found for: CBDTHCR No results found for: D8THCCBX No results found for: D9THCCBX  ROS  Constitutional: Denies any fever or chills Gastrointestinal: No reported hemesis, hematochezia, vomiting, or acute GI distress Musculoskeletal: Back Pain (Lower Back, Down Buttocks down right leg), left leg pain   Neurological: No reported episodes of acute onset apraxia, aphasia, dysarthria, agnosia, amnesia, paralysis, loss of coordination, or loss of consciousness  Medication Review  Fluticasone-Umeclidin-Vilant, Magnesium Oxide, Vibegron , albuterol , allopurinol , apixaban , buprenorphine , docusate sodium, empagliflozin , ezetimibe , furosemide , gabapentin , metoprolol  succinate, multivitamin, naloxone, nitroGLYCERIN , omeprazole , oxyCODONE -acetaminophen , potassium chloride , rosuvastatin , tiZANidine , and zolpidem   History Review  Allergy: Andrew Townsend has no known allergies. Drug: Andrew Townsend  reports no history of drug use. Alcohol:  reports that he does not currently use alcohol. Tobacco:  reports that he quit smoking about 10 years ago. His smoking use included e-cigarettes and cigarettes. He uses smokeless tobacco. Social: Andrew Townsend  reports that he quit smoking about 10 years ago. His smoking use included e-cigarettes and cigarettes. He uses smokeless tobacco. He reports that he does not currently use alcohol. He reports that he does not use drugs. Medical:  has a past medical history of AICD (automatic cardioverter/defibrillator) present, Anemia, Arthritis, Blood transfusion without reported diagnosis, CAD (coronary artery disease), Cataract,  Chronic kidney disease, COPD (chronic obstructive pulmonary disease) (HCC), GERD (gastroesophageal reflux disease), History of kidney stones, Hyperlipidemia, Hypertension, Insomnia, 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), Myocardial infarction (HCC), and Stroke (HCC). Surgical: Andrew Townsend  has a past surgical history that includes Coronary artery bypass graft; Hernia repair; Joint replacement (Right); Tonsillectomy; Wisdom tooth extraction; Upper gi endoscopy; Colonoscopy; arm surgery (Right); ICD IMPLANT; Eye surgery (Bilateral); Multiple tooth extractions; Mandible surgery; Inguinal hernia repair (Bilateral, 08/19/2023); and Inguinal hernia repair (Right, 02/12/2024). Family: family history is not on file.  Laboratory Chemistry Profile   Renal Lab Results  Component Value Date   BUN 20 02/07/2024   CREATININE 1.42 (H) 02/07/2024   BCR 10 10/18/2023   GFRNONAA 52 (L) 02/07/2024    Hepatic Lab  Results  Component Value Date   AST 26 04/17/2023   ALT 32 04/17/2023   ALBUMIN 3.9 10/18/2023   ALKPHOS 99 04/17/2023    Electrolytes Lab Results  Component Value Date   NA 139 02/07/2024   K 4.6 02/07/2024   CL 108 02/07/2024   CALCIUM  9.1 02/07/2024   MG 2.1 07/31/2022   PHOS 3.6 10/18/2023    Bone Lab Results  Component Value Date   VD25OH 64.6 11/07/2022    Inflammation (CRP: Acute Phase) (ESR: Chronic Phase) Lab Results  Component Value Date   CRP 18 (H) 06/26/2022   ESRSEDRATE 6 06/26/2022         Note: Above Lab results reviewed.  Recent Imaging Review  CT ABDOMEN PELVIS WO CONTRAST CLINICAL DATA:  Bilateral recurrent inguinal hernia.  EXAM: CT ABDOMEN AND PELVIS WITHOUT CONTRAST  TECHNIQUE: Multidetector CT imaging of the abdomen and pelvis was performed following the standard protocol without IV contrast.  RADIATION DOSE REDUCTION: This exam was performed according to the departmental dose-optimization program which includes  automated exposure control, adjustment of the mA and/or kV according to patient size and/or use of iterative reconstruction technique.  COMPARISON:  None Available.  FINDINGS: Lower chest: Heart is mildly enlarged. Subpleural scarring left lower lobe. No pleural effusion.  Hepatobiliary: The liver is normal in size and contour. No focal hepatic lesion identified. Gallbladder is unremarkable. No biliary ductal dilatation.  Pancreas: Unremarkable  Spleen: Unremarkable  Adrenals/Urinary Tract: The adrenal glands are normal. Kidneys are symmetric in size. No hydronephrosis. Urinary bladder is unremarkable.  Stomach/Bowel: Normal morphology of the stomach. Duodenal diverticulum. Descending colonic diverticulosis. No CT evidence for acute diverticulitis. No evidence for bowel obstruction. Large amount of stool within the cecum, ascending and transverse colon. No free fluid or free intraperitoneal air.  Vascular/Lymphatic: Extensive peripheral calcification involving the abdominal aorta. Infrarenal abdominal aorta measures up to 2.8 cm (image 34; series 2). No retroperitoneal lymphadenopathy.  Reproductive: Heterogeneous prostate with dystrophic calcifications. Complex fluid left hemiscrotum, potentially large hydrocele.  Other: Complex appearing small bowel containing right inguinal hernia (image 33; series 3) without evidence for obstruction. Small fat containing left inguinal hernia. Evidence of prior lower anterior abdominal wall repair.  Musculoskeletal: Extensive lower thoracic and lumbar spine degenerative changes. No acute process.  IMPRESSION: 1. Complex appearing small bowel containing right inguinal hernia without evidence for obstruction. 2. Small fat containing left inguinal hernia. 3. Complex fluid left hemiscrotum, potentially large hydrocele. Consider further evaluation with scrotal ultrasound. 4. Colonic diverticulosis without CT evidence for  acute diverticulitis. 5. Large amount of stool within the cecum, ascending and transverse colon. 6. Infrarenal abdominal aorta measures up to 2.8 cm. Recommend follow-up ultrasound every 5 years. (Ref.: J Vasc Surg. 2018; 67:2-77 and J Am Coll Radiol 2013;10(10):789-794.) . 7. Aortic atherosclerosis.  Electronically Signed   By: Bard Moats M.D.   On: 01/11/2024 11:09 Note: Reviewed        Physical Exam  Vitals: BP (!) 144/78 (BP Location: Right Arm, Patient Position: Sitting, Cuff Size: Normal)   Pulse 68   Temp (!) 97.1 F (36.2 C) (Temporal)   Resp 18   Ht 5' 6 (1.676 m)   Wt 126 lb (57.2 kg)   SpO2 100%   BMI 20.34 kg/m  BMI: Estimated body mass index is 20.34 kg/m as calculated from the following:   Height as of this encounter: 5' 6 (1.676 m).   Weight as of this encounter: 126 lb (57.2 kg). Ideal: Ideal  body weight: 63.8 kg (140 lb 10.5 oz) General appearance: Well nourished, well developed, and well hydrated. In no apparent acute distress Mental status: Alert, oriented x 3 (person, place, & time)       Respiratory: No evidence of acute respiratory distress Eyes: PERLA  Musculoskeletal: +LBP radiating down to bilateral hip down to bilateral legs (R>L) Assessment   Diagnosis Status  1. Chronic radicular lumbar pain   2. 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 Chronic pain syndrome   4. Lumbar facet arthropathy   5. Medication management    Persistent Controlled Controlled   Updated Problems: No problems updated.  Plan of Care  Problem-specific:  Assessment and Plan    Chronic radicular lumbar pain: Chronic low back pain with radicular symptoms to the buttocks. Previous lumbar injections inconsistently effective and costly. - Continue Percocet 10-325 mg as prescribed. - Continue Butrans  patch as prescribed. - Ensure refills for Percocet and Butrans  patch for three months. - Verify Tizanidine  refills; six-month supply available.  Patient's  pain is well managed with Percocet and butrans  patch, will continue on current medication regimen.  Prescribing drug monitoring (PDMP) reviewed, findings consistent with the use of prescribed medication and no evidence of narcotic misuse or abuse. Routine UDS ordered today.  No side effects or adverse reaction reported to medication.  The patient was advised to drink more water to prevent from opioid-induced constipation.  Schedule follow-up in 90 days for medication management.  Recovery from left tibial plateau fracture Pain management integrated with chronic low back pain treatment. - Continue current pain management regimen with Percocet and Butrans  patch.   Arthritis of sacroiliac joint both sides: Medications   oxyCODONE -acetaminophen  (PERCOCET) 10-325 MG tablet    Sig: Take 1 tablet by mouth every 8 (eight) hours as needed for pain. Must last 30 days.    Dispense:  90 tablet    Refill:  0    Chronic Pain: STOP Act (Not applicable) Fill 1 day early if closed on refill date. Avoid benzodiazepines within 8 hours of opioids   buprenorphine  (BUTRANS ) 20 MCG/HR PTWK    Sig: Place 1 patch onto the skin once a week.    Dispense:  4 patch    Refill:  2   oxyCODONE -acetaminophen  (PERCOCET) 10-325 MG tablet    Sig: Take 1 tablet by mouth every 8 (eight) hours as needed for pain. Must last 30 days.    Dispense:  90 tablet    Refill:  0    Chronic Pain: STOP Act (Not applicable) Fill 1 day early if closed on refill date. Avoid benzodiazepines within 8 hours of opioids   oxyCODONE -acetaminophen  (PERCOCET) 10-325 MG tablet    Sig: Take 1 tablet by mouth every 8 (eight) hours as needed for pain. Must last 30 days.    Dispense:  90 tablet    Refill:  0    Chronic Pain: STOP Act (Not applicable) Fill 1 day early if closed on refill date. Avoid benzodiazepines within 8 hours of opioids   naloxone (NARCAN) nasal spray 4 mg/0.1 mL    Sig: Place 1 spray into the nose as needed for up to 365 doses (for  opioid-induced respiratory depresssion). In case of emergency (overdose), spray once into each nostril. If no response within 3 minutes, repeat application and call 911.    Dispense:  1 each    Refill:  1    Instruct patient in proper use of device.   Chronic pain syndrome:  Patient's pain is well managed with Percocet and butrans  patch, will continue on current medication regimen.  Prescribing drug monitoring (PDMP) reviewed, findings consistent with the use of prescribed medication and no evidence of narcotic misuse or abuse.  Schedule follow-up in 90 days for medication management.      Andrew Townsend has a current medication list which includes the following long-term medication(s): albuterol , albuterol , allopurinol , eliquis , ezetimibe , furosemide , gabapentin , metoprolol  succinate, nitroglycerin , omeprazole , potassium chloride , rosuvastatin , tizanidine , and zolpidem .  Pharmacotherapy (Medications Ordered): Meds ordered this encounter  Medications   oxyCODONE -acetaminophen  (PERCOCET) 10-325 MG tablet    Sig: Take 1 tablet by mouth every 8 (eight) hours as needed for pain. Must last 30 days.    Dispense:  90 tablet    Refill:  0    Chronic Pain: STOP Act (Not applicable) Fill 1 day early if closed on refill date. Avoid benzodiazepines within 8 hours of opioids   buprenorphine  (BUTRANS ) 20 MCG/HR PTWK    Sig: Place 1 patch onto the skin once a week.    Dispense:  4 patch    Refill:  2   oxyCODONE -acetaminophen  (PERCOCET) 10-325 MG tablet    Sig: Take 1 tablet by mouth every 8 (eight) hours as needed for pain. Must last 30 days.    Dispense:  90 tablet    Refill:  0    Chronic Pain: STOP Act (Not applicable) Fill 1 day early if closed on refill date. Avoid benzodiazepines within 8 hours of opioids   oxyCODONE -acetaminophen  (PERCOCET) 10-325 MG tablet    Sig: Take 1 tablet by mouth every 8 (eight) hours as needed for pain. Must last 30 days.    Dispense:  90 tablet    Refill:  0     Chronic Pain: STOP Act (Not applicable) Fill 1 day early if closed on refill date. Avoid benzodiazepines within 8 hours of opioids   naloxone (NARCAN) nasal spray 4 mg/0.1 mL    Sig: Place 1 spray into the nose as needed for up to 365 doses (for opioid-induced respiratory depresssion). In case of emergency (overdose), spray once into each nostril. If no response within 3 minutes, repeat application and call 911.    Dispense:  1 each    Refill:  1    Instruct patient in proper use of device.   Orders:  Orders Placed This Encounter  Procedures   ToxASSURE Select 13 (MW), Urine    Volume: 30 ml(s). Minimum 3 ml of urine is needed. Document temperature of fresh sample. Indications: Long term (current) use of opiate analgesic (S20.108)    Release to patient:   Immediate        Return in about 3 months (around 06/09/2024) for (F2F), (MM), Emmy Blanch NP.    Recent Visits Date Type Provider Dept  12/12/23 Office Visit Quinnie Barcelo K, NP [facility]  Showing recent visits within past 90 days and meeting all other requirements Today's Visits Date Type Provider Dept  03/09/24 Office Visit Noni Stonesifer K, NP [facility]  Showing today's visits and meeting all other requirements Future Appointments Date Type Provider Dept  06/02/24 Appointment Zamyra Allensworth K, NP [facility]  Showing future appointments within next 90 days and meeting all other requirements  I discussed the assessment and treatment plan with the patient. The patient was provided an opportunity to ask questions and all were answered. The patient agreed with the plan and demonstrated an understanding of the instructions.  Patient advised  to call back or seek an in-person evaluation if the symptoms or condition worsens.  I personally spent a total of 30 minutes in the care of the patient today including preparing to see the patient, getting/reviewing separately obtained history, performing a  medically appropriate exam/evaluation, counseling and educating, placing orders, referring and communicating with other health care professionals, documenting clinical information in the EHR, independently interpreting results, communicating results, and coordinating care.  Note by: Rockie Vawter K Brennley Curtice, NP (TTS and AI technology used. I apologize for any typographical errors that were not detected and corrected.) Date: 03/09/2024; Time: 11:30 AM

## 2024-03-09 NOTE — Telephone Encounter (Signed)
 [facility] Pre-operative Risk Assessment     Request for surgical clearance:     Endoscopy Procedure  What type of surgery is being performed?     Colonoscopy   When is this surgery scheduled?     04/03/24  What type of clearance is required ?   Pharmacy  Are there any medications that need to be held prior to surgery and how long? E;iquis 2 days  Practice name and name of physician performing surgery?      [facility]  What is your office phone and fax number?      Phone- [phone]  Fax- [phone]  Anesthesia type (None, local, MAC, general) ?       MAC   Please route your response to Alethea Blocker, CMA

## 2024-03-09 NOTE — Progress Notes (Signed)
 Nursing Pain Medication Assessment:  Safety precautions to be maintained throughout the outpatient stay will include: orient to surroundings, keep bed in low position, maintain call bell within reach at all times, provide assistance with transfer out of bed and ambulation.  Medication Inspection Compliance: Pill count conducted under aseptic conditions, in front of the patient. Neither the pills nor the bottle was removed from the patient's sight at any time. Once count was completed pills were immediately returned to the patient in their original bottle.  Medication: Buprenorphine  patch (Butrans ) Pill/Patch Count: 1 of 4 pills/patches remain Pill/Patch Appearance: Markings consistent with prescribed medication Bottle Appearance: Standard pharmacy container. Clearly labeled. Filled Date: 10 / 01 / 2025 Last Medication intake:  03/04/2024

## 2024-03-10 ENCOUNTER — Ambulatory Visit

## 2024-03-10 ENCOUNTER — Ambulatory Visit (HOSPITAL_BASED_OUTPATIENT_CLINIC_OR_DEPARTMENT_OTHER): Admitting: Physical Therapy

## 2024-03-10 ENCOUNTER — Encounter (HOSPITAL_BASED_OUTPATIENT_CLINIC_OR_DEPARTMENT_OTHER): Payer: Self-pay | Admitting: Physical Therapy

## 2024-03-10 DIAGNOSIS — M5459 Other low back pain: Secondary | ICD-10-CM | POA: Diagnosis not present

## 2024-03-10 DIAGNOSIS — M25562 Pain in left knee: Secondary | ICD-10-CM | POA: Diagnosis not present

## 2024-03-10 DIAGNOSIS — M6281 Muscle weakness (generalized): Secondary | ICD-10-CM | POA: Diagnosis not present

## 2024-03-10 DIAGNOSIS — R262 Difficulty in walking, not elsewhere classified: Secondary | ICD-10-CM | POA: Diagnosis not present

## 2024-03-10 DIAGNOSIS — M62562 Muscle wasting and atrophy, not elsewhere classified, left lower leg: Secondary | ICD-10-CM | POA: Diagnosis not present

## 2024-03-10 DIAGNOSIS — I255 Ischemic cardiomyopathy: Secondary | ICD-10-CM | POA: Diagnosis not present

## 2024-03-10 DIAGNOSIS — M25572 Pain in left ankle and joints of left foot: Secondary | ICD-10-CM | POA: Diagnosis not present

## 2024-03-10 DIAGNOSIS — M76822 Posterior tibial tendinitis, left leg: Secondary | ICD-10-CM | POA: Diagnosis not present

## 2024-03-10 DIAGNOSIS — R2689 Other abnormalities of gait and mobility: Secondary | ICD-10-CM | POA: Diagnosis not present

## 2024-03-10 LAB — CUP PACEART REMOTE DEVICE CHECK
Battery Remaining Longevity: 33 mo
Battery Voltage: 2.96 V
Brady Statistic RV Percent Paced: 5.93 %
Date Time Interrogation Session: 20251021033323
HighPow Impedance: 66 Ohm
Implantable Lead Connection Status: 753985
Implantable Lead Implant Date: 20180209
Implantable Lead Location: 753860
Implantable Lead Model: 6935
Implantable Pulse Generator Implant Date: 20180209
Lead Channel Impedance Value: 247 Ohm
Lead Channel Impedance Value: 304 Ohm
Lead Channel Sensing Intrinsic Amplitude: 3.625 mV
Lead Channel Sensing Intrinsic Amplitude: 3.625 mV
Lead Channel Setting Pacing Amplitude: 2 V
Lead Channel Setting Pacing Pulse Width: 0.5 ms
Lead Channel Setting Sensing Sensitivity: 0.3 mV
Zone Setting Status: 755011

## 2024-03-10 NOTE — Therapy (Signed)
 OUTPATIENT PHYSICAL THERAPY LOWER EXTREMITY TREATMENT  Patient Name: Andrew Townsend MRN: [id number] DOB:25-Apr-1951, 73 y.o., male Today's Date: 03/10/2024  END OF SESSION:  PT End of Session - 03/10/24 1234     Visit Number 18    Number of Visits 33    Date for Recertification  04/28/24    Authorization Type [employer] MCR    Authorization Time Period 8/5 - 10/3    Authorization - Number of Visits 20    Progress Note Due on Visit 20    PT Start Time 1233    PT Stop Time 1311    PT Time Calculation (min) 38 min    Behavior During Therapy WFL for tasks assessed/performed                 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)    Medtronic   Myocardial infarction (HCC)    Stroke (HCC)    Past Surgical History:  Procedure Laterality Date   arm surgery Right    broken - pins placed but now removed   COLONOSCOPY     CORONARY ARTERY BYPASS GRAFT     in [place]   EYE SURGERY Bilateral    remove cataracts   HERNIA REPAIR     ICD IMPLANT     Medtronic   INGUINAL HERNIA REPAIR Bilateral 08/19/2023   Procedure: OPEN BILATERAL INGUINAL HERNIA REPAIR WITH MESH;  Surgeon: Rubin Calamity, MD;  Location: [facility] OR;  Service: General;  Laterality: Bilateral;   INGUINAL HERNIA REPAIR Right 02/12/2024   Procedure: OPEN RIGHT INGUINAL HERNIA WITH MESH;  Surgeon: Rubin Calamity, MD;  Location: [facility] OR;  Service:  General;  Laterality: Right;   JOINT REPLACEMENT Right    shoulder in [place]   MANDIBLE SURGERY     lower jaw surgery for placement of dentures per patient   MULTIPLE TOOTH EXTRACTIONS     all teeth pulled - dentures   TONSILLECTOMY     UPPER GI ENDOSCOPY     WISDOM TOOTH EXTRACTION     Patient Active Problem List   Diagnosis Date Noted   Tibial plateau fracture, left 11/19/2023   COVID 07/22/2023   Bilateral recurrent inguinal hernia without obstruction or gangrene 05/09/2023   Lumbar radiculopathy 03/26/2023   Diplopia 06/26/2022   Hyperkalemia 04/03/2022   Paroxysmal atrial fibrillation (HCC) 02/11/2022   Cerebral infarction, unspecified (HCC) 01/30/2022   Other idiopathic scoliosis, thoracolumbar region 12/21/2021   Arthritis of sacroiliac joint of both sides 12/21/2021   Piriformis syndrome of both sides 12/21/2021   Hx of shoulder surgery (left rotator cuff surgery, right rotator cuff then reverse total shoulder arthoplasty) 12/07/2021   Lumbar facet arthropathy 12/07/2021   Bilateral primary osteoarthritis of knee 12/07/2021   Sacroiliac joint pain 12/07/2021   Gastroesophageal reflux disease 10/27/2021   S/P CABG (coronary artery bypass  graft) 05/26/2021   ICD (implantable cardioverter-defibrillator) in place 05/26/2021   COPD (chronic obstructive pulmonary disease) (HCC) 05/26/2021   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   Insomnia 05/26/2021   Atherosclerotic heart disease of native coronary artery with unspecified angina pectoris 05/26/2021   BPH (benign prostatic hyperplasia) 03/25/2021   Stage 3b chronic kidney disease (HCC) 06/16/2018   Hypertension 04/03/2018   Hyperlipidemia 04/03/2018   Chronic pain 04/03/2018   Chronic systolic congestive heart failure (HCC) 04/03/2018   Gout 04/03/2018   Renal artery stenosis 04/03/2018   Thrombocytopenia 04/03/2018   Anemia in chronic kidney disease 04/03/2018   Ischemic dilated cardiomyopathy (HCC) 06/29/2016   VT  (ventricular tachycardia) (HCC) 06/28/2016    PCP:  Chandra Toribio POUR, MD     REFERRING PROVIDER:  Chandra Toribio POUR, MD  12/24/23 LLE : Dr Elspeth Parker   REFERRING DIAG:  M54.16 (ICD-10-CM) - Lumbar radiculopathy  8/5/25Closed fx of left tibeal plateau    Rationale for Evaluation and Treatment: Rehabilitation  THERAPY DIAG:  Acute pain of left knee  Muscle weakness (generalized)  Other low back pain  ONSET DATE: >10 yrs  SUBJECTIVE:                                                                                                                                                                                           SUBJECTIVE STATEMENT: Pt reports he tried walking around baseball field; can't make it all the way around due to excruciating pain in lower back and buttocks.  He voices frustration with lack of change in back/buttock pain and is concerned about finances with co-pays adding up. He doesn't want surgery but is willing to try anything else. He reports mild improvement in foot pain with shock wave therapy from Dr. Burnetta.    Eval: Pt reports LBP and R buttocks pain. Injections into the back and piriformis are hit or miss in terms of relief. They are too expensive. Pt does have a [employer] membership. Pt has been dealing with this condition for quite some time with multiple episodes of PT and chiro. Pain will down into the posterior and will be just above the knee and will start having R calf cramps. Pt is unable to walk a mile like he used to , he is only down to .25 miles now with shooting. Pt is limited to 15 mins of sitting before pain starts. Denies cancer red flags.   PERTINENT HISTORY:  CHF, CAD, COPD, HTN, CKD, recent hernia repair, R TSA,   PAIN:  Are you having pain? Yes: NPRS scale: 2/10 when sitting, up to 10/10 when walking Pain location: R QL,  middle lower back, sacral/glute on R> L Pain description: sharp Aggravating factors: extension, sitting for too long,    Relieving factors: chiro, resting, meds, movement, lumbar support     PRECAUTIONS: ICD/Pacemaker last shocked registered on Oct 14, 2023  RED FLAGS: None   WEIGHT BEARING RESTRICTIONS: No WBAT 12/16/23  FALLS:  Has patient fallen in last 6 months? Yes. Number of falls 3, Off ladder; off bed; in parking garage  LIVING ENVIRONMENT: Lives with: lives with their family, sister Lives in: House/apartment Stairs: to enter Has following equipment at home: has walking stick and walker available at home, does not use   OCCUPATION: retired   PLOF: Independent with basic ADLs  PATIENT GOALS: relieve back pain; wants to hike, wants to build out his mini camper to travel   12/24/23: heal to finish his mini camper and travel  OBJECTIVE:  Note: Objective measures were completed at Evaluation unless otherwise noted.  DIAGNOSTIC FINDINGS:  CT left knee from 11/09/2023 Lateral tibial plateau fracture with evidence of some impaction but no significantly displaced fragments.  Chondrocalcinosis noted within the medial and lateral joint spaces.    Moderate atherosclerotic calcifications within the abdominal aorta. A vascular stent overlies the L1-2 disc level.   IMPRESSION: 1. Moderate to severe levocurvature centered at L2. 2. Severe multilevel degenerative disc and endplate changes. 3. Mild retrolisthesis of L1 on L2.  PATIENT SURVEYS:  ODI: ODI Score = 21 points (42%) 9/3 = 23 10/14 18/50 ( 36% disability( Interpretation: 41% to 60% (severe disability): Pain is a primary problem for these patients, but they may also be experiencing significant problems in travel, personal care, social life, sexual activity and sleep. A detailed evaluation is appropriate.  10/14   12/24/23 LEFS: 30/80 9/3 = 44/80 10/14 52/80     COGNITION:   Overall cognitive status: WFL              SENSATION: NT into R thigh, neuropathy into distal LE      L knee in knee immobilizer  10/14: numbness and  tingling left ankle and foot   LOWER EXTREMITY ROM:   Active ROM Right eval Left eval Left  01/22/24 Left 10/14   Hip flexion        Hip extension        Hip abduction        Hip adduction        Hip internal rotation        Hip external rotation        Knee flexion   100 120d 125  Knee extension   -15 -9 active -7 prior to manual therapy 0 after   Ankle dorsiflexion   Limited by edema  15   Ankle plantarflexion   Limited by edema 40   Ankle inversion       Ankle eversion         (Blank rows = not tested)  LOWER EXTREMITY MMT:    MMT Right eval Left eval  Hip flexion    Hip extension    Hip abduction    Hip adduction    Hip internal rotation    Hip external rotation    Knee flexion    Knee extension    Ankle dorsiflexion    Ankle plantarflexion  N/T  Ankle inversion  3+  Ankle eversion  3+   (Blank rows = not tested)             GAIT:  Ambulating without device: lateral  shift away from left LE    Observation: scoliotic curves noted in lumbar and thoracic spine    Palpation:  Area of tenderness in the navicular area  Medial calf tightness and soreness     [facility] Adult PT Treatment:                                                DATE:  03/10/24 Therapeutic exercise: -NuStep L4/5, LE only x 6 min -Seated piriformis stretch (some pain with stretch in sitting, and in transitioning to stretch) -Supine bridge x 10 with cues for arm press, glute squeeze and TrA set, monitoring tolerance -Seated hamstring R - not tolerate, Lt tolerated; Hooklying Rt hamstring stretch with strap - not tolerated; standing hamstring stretch with foot on 6 step - tolerated!   -Rt/Lt hip flexor stretch at counter and seated with LE off table edge Manual therapy:  (pt in Lt sidelying)- TPR to Rt piriformis, deep hip rotators, glute med (pt very sensitive to light touch to these areas Self care: ball massage to R glute and lower back in standing  10/14 Manual: Trigger point  release to medial gastroc and trigger point release to medial arch  Grade I and II PA and AP mobilizations for extension  Review of self soft tissue mobilization   Performed re- assessment on patients ankle / lower leg   02/10/24 Calf stretch ins tanding 20sec x3L Partial squats at rail x15 Side stepping along rail x2laps L knee PROM Roller to L calf, STM Supine SLR (low range) x15L Sidelying hip abduction 2x10 bil LE Standing HR 2x10 DL Instructed in self IASTM to R hip and L calf  01/31/24 Pt seen for aquatic therapy today.  Treatment took place in water 3.5-4.75 ft in depth at the [facility] pool. Temp of water was 91.  Pt entered/exited the pool via stairs with heavy ue support then exits via lift   *Unsupported walking forward/ backward with cues for heel/toe and toe/heel pattern * unsupported side stepping  *forward march with kick  * UE support yellow HB:  heel/toe raises x 15 *toe walking forward then back *heel walking forward then back *HB carry RBHB forward and back unilaterally then bilaterally 3.8ft * L stretch at pool wall / trunk ext stretch 3.6 ft * UE on yellow hand floats: tandem gait forward/ backward 3.6 ft * plank position with hands on bench in water and alternating hip ext *seated: LAQ *forward step up leading R/L 2 x 5 * staggered then wide stance with solid black noodle pull down to thighs for core engagement x 5-10 * return to walking forward/backward with UE unsupported    9/15  Calf stretch with quad set 3s 2x10 Knee flexion gapping 2x10 2s LAQ GTB 3x10 GTB HS curl 3x10 Bridge with blue band 3x10 2 lateral step down 3x8 Standing HR 2x20 DL     [facility] Adult PT Treatment:                                                DATE: 01/29/24 Pt seen for aquatic therapy today.  Treatment took place in water 3.5-4.75 ft in depth at the [facility] pool. Temp of water was 91.  Pt entered/exited the pool via stairs with heavy ue support  then exits via lift    *Unsupported walking forward, backward and side stepping in 3.6 -4.0 - multiple laps of each *step ups onto bottom step leading R/L 2 x 5.  Cues for TKE left with rising. (Some discomfort initially but reduces with reps) *lateral step ups left x 8 *alternating step downs 2 x 5 (no pain) *L stretch *forward walking with small knee kick *Solid noodle pull down for TrA engagement wide stance then staggered x 10     PATIENT EDUCATION:    Education details: exercise rationale /progression/ modification,  Person educated: Patient Education method: Programmer, multimedia, Facilities manager, Verbal cues,  Education comprehension: verbalized understanding, returned demonstration, and verbal cues required   HOME EXERCISE PROGRAM:    Access Code: [email or web address] URL: [email or web address] Date: 10/29/2023 Prepared by: Dale Call  Aquatic TBA   Access Code: [facility] URL: [email or web address] Date: 12/24/2023 Prepared by: Matilda Kohut   Exercises - Seated Quad Set  - 3-5 x daily - 7 x weekly - 1 sets - 5-10 reps - Long Sitting Quad Set  - 3-5 x daily - 7 x weekly - 1 sets - 5-10 reps - Supine Hip Abduction  - 3-5 x daily - 7 x weekly - 3-5 sets - 5-10 reps - Straight Leg Raise  - 3-5 x daily - 7 x weekly - 3-5 sets - 5-10 reps     ASSESSMENT:   CLINICAL IMPRESSION: Pt's session focused on lower back as his knee and foot were not bothering him as much.  Pt with limited tolerance for Rt hamstring stretch (reports increase pain in sacral region with supine and seated version) and with high pain sensitivity with light pressure to area with STM.  Pt may be a candidate for DN, but PT can discuss next visit. (Pt is aware of the out of pocket expense for this).  Pt slightly lower by end of session.   Patient will benefit from continued skilled therapy to improve ability to ambulate.  Please update HEP next visit, if time allows.    PN: pt using SPC when out of  home only. He reports left foot is limiting his amb > than LB or left knee.  He has been using frozen water bottle which has provided releif for ~ 1 hour after completed to plantar aspect left foot. He reports left knee pain has improved by 85% since onset. LBP has had no measureable change. ODI demonstrates a small increase in disability likely due to addition of tibeal plateau fx and use of crutch     Initial impression (tib plat fx) Patient is a 73 y.o. m who was seen today for physical therapy evaluation and treatment for Closed fracture of lateral portion of left tibial plateau. He is WBAT as per Md as fx is stable. He has been being seen also for LBP which is now on hold due to new fx.  He presents today for aquatic therapy intervention.  He is amb with fww.  Fww height adjusted as he maneuvers 400 ft ot pool setting without difficulty.  His fx is stabile not requiring surgical intervention. Has knee immobilizer that he is wearing when out of home. He did drive himself here today. He is now WBAT.  Reports climbing multiple steps daily to bed and bath.  Pt edu on proper technique for stair climbing using crutches. Objective testing demonstrates left knee ROM limited in both flex and extension  OBJECTIVE IMPAIRMENTS: Abnormal gait, decreased activity tolerance, decreased balance, decreased coordination, decreased endurance, decreased mobility, difficulty walking, decreased ROM, decreased strength, hypomobility, increased edema, increased fascial restrictions, impaired perceived functional ability, impaired flexibility, improper body mechanics, and pain.    ACTIVITY LIMITATIONS: carrying, lifting, bending, sitting, standing, squatting, stairs, transfers, and locomotion level   PARTICIPATION LIMITATIONS: meal prep, cleaning, laundry, interpersonal relationship, driving, shopping, community activity, and yard work   PERSONAL FACTORS: PMH - chf, macular degeneration, DM, cervical CA are also  affecting patient's functional outcome.    REHAB POTENTIAL: fair   CLINICAL DECISION MAKING: complicated/unstable   EVALUATION COMPLEXITY: moderate     GOALS:   SHORT TERM GOALS: Target date: 12/10/2023     Pt will become independent with HEP in order to demonstrate synthesis of PT education.     Goal status: Met 12/30/22    2. Pt will demonstrate at least a 12.8 improvement in Oswestry Index in order to demonstrate a clinically significant change in LBP and function.   Status: see above  Goal status: In progress -01/22/24     LONG TERM GOALS: Target date: 03/06/2024        Pt  will become independent with final HEP in order to demonstrate synthesis of PT education.    Goal status: INITIAL   2.  Pt will demonstrate at least a 25pt improvement in Oswestry Index in order to demonstrate a clinically significant change in LBP and function. Goal status: In progress 01/22/24   3.  Pt will be able to demonstrate/report ability to sit/stand/sleep for extended periods of time without pain in order to demonstrate functional improvement and tolerance to static positioning.    Goal status: In progress 02/11/24   4. Pt will be able to demonstrate/report ability to walk >25 mins without pain in order to demonstrate functional improvement and tolerance to exercise and community mobility.     Goal status: INITIAL     5. Pt to improve on LEFS by at least 9 point  (MCID)to demonstrate statistically significant Improvement in function.   Baseline: 30/80; 44/80   Goals Status: Met 01/22/24   6. Pt will improve left knee extension to 0d and flex to at least 120d   Baseline: See chart   Goal Status: In progress 01/22/24   7. Pt will amb using SPC community distances without limitation of knee pain   Baseline: Using fww x 400 ft; progressed to single crutch   Goal status:  Met 01/22/24 (pt limited by left foot pain not knee)  8. Pt will report decrease in left knee pain by at least 75% for  improved toleration to functional mobility/amb  Baseline:  Goal status: Met 02/11/24 (85)  PLAN:   PLAN:   PT FREQUENCY: 1-2x/week   PT DURATION: 6 weeks    PLANNED INTERVENTIONS: Therapeutic exercises, Therapeutic activity, Neuromuscular re-education, Balance training, Gait training, Patient/Family education, Joint manipulation, Joint mobilization, Stair training, , DME instructions, Aquatic Therapy, Dry Needling, Electrical stimulation unattended, Spinal manipulation, Spinal mobilization, Cryotherapy, Moist heat, scar mobilization, Splintting, Taping, Vasopneumatic device, Traction, Ultrasound, Ionotophoresis 4mg /ml Dexamethasone , Manual therapy.   PLAN FOR NEXT SESSION: mobilizations; weight room progression, lumbar mobility 12/24/23: aquatic: gait, balance, core and LE strengthening; stretching, pain management Trigger point release to calf and medial arch, knee extension strengthening. LE strengthening   Delon Aquas, [place] 03/10/24 6:12 PM [facility] [address] [place], [place], [address] Phone: [phone]   Fax:  [phone]

## 2024-03-11 ENCOUNTER — Ambulatory Visit: Payer: Self-pay | Admitting: Internal Medicine

## 2024-03-11 ENCOUNTER — Ambulatory Visit (HOSPITAL_BASED_OUTPATIENT_CLINIC_OR_DEPARTMENT_OTHER): Admitting: Orthopaedic Surgery

## 2024-03-11 DIAGNOSIS — M533 Sacrococcygeal disorders, not elsewhere classified: Secondary | ICD-10-CM

## 2024-03-11 DIAGNOSIS — G8929 Other chronic pain: Secondary | ICD-10-CM | POA: Diagnosis not present

## 2024-03-11 NOTE — Progress Notes (Signed)
 Chief Complaint: Left knee injury         History of Present Illness 03/11/2024: Presents today for follow-up of his left knee.  At this time he is continuing to improve and is now walking without pain.  He is complaining of right SI pain at today's visit which has been chronic and he has been getting injections at the pain center   Three days ago, he sustained a tibial plateau fracture after falling from the fifth rung of an eight-foot A-frame ladder. X-rays and a CT scan revealed a fracture of the lateral tibial plateau. He experiences significant knee pain, ranging from 3-4 at rest to 8-9 with movement. He has difficulty using crutches and slides on his buttocks to navigate stairs due to fear of falling. A recent fall occurred when he attempted to get out of bed without his brace, resulting in a loss of balance. He is concerned about navigating his home, especially carpeted areas, and accessing the upstairs bathroom.   He reports numbness in his foot and difficulty wiggling his toes, with minimal sensation in the toes and significant numbness in the foot. Ankle movement is limited and painful, with pain radiating up the leg. He is currently using oxycodone  10 mg a couple of times a day for pain management and is on Eliquis .     Surgical History:   None   PMH/PSH/Family History/Social History/Meds/Allergies:         Past Medical History:  Diagnosis Date   AICD (automatic cardioverter/defibrillator) present      Medtronic   Anemia     Arthritis     Blood transfusion without reported diagnosis      with heart surgery per patient   CAD (coronary artery disease)     Cataract      had surgery to remove   Chronic kidney disease      stage 3 per patient   COPD (chronic obstructive pulmonary disease) (HCC)     GERD (gastroesophageal reflux disease)     History of kidney stones      passed stones, no surgery per patient   Hyperlipidemia     Hypertension      Insomnia     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)      Medtronic   Myocardial infarction (HCC)     Stroke [facility])               Past Surgical History:  Procedure Laterality Date   arm surgery Right      broken - pins placed but now removed   COLONOSCOPY       CORONARY ARTERY BYPASS GRAFT        in [place]   EYE SURGERY Bilateral      remove cataracts   HERNIA REPAIR       ICD IMPLANT        Medtronic   INGUINAL HERNIA REPAIR Bilateral 08/19/2023    Procedure: OPEN BILATERAL INGUINAL HERNIA REPAIR WITH MESH;  Surgeon: Rubin Calamity, MD;  Location: [facility] OR;  Service: General;  Laterality: Bilateral;   JOINT REPLACEMENT Right      shoulder in [place]   MANDIBLE SURGERY        lower jaw surgery for placement of dentures per patient   MULTIPLE TOOTH EXTRACTIONS        all teeth pulled - dentures   TONSILLECTOMY       UPPER GI ENDOSCOPY  WISDOM TOOTH EXTRACTION            Social History         Socioeconomic History   Marital status: Widowed      Spouse name: Not on file   Number of children: Not on file   Years of education: Not on file   Highest education level: GED or equivalent  Occupational History   Not on file  Tobacco Use   Smoking status: Former      Current packs/day: 1.00      Types: E-cigarettes, Cigarettes      Quit date: 2015   Smokeless tobacco: Current   Tobacco comments:      Quit smoking cigarettes in 2015 - 1 ppd  Vaping Use   Vaping status: Every Day   Substances: Nicotine  Substance and Sexual Activity   Alcohol use: Not Currently   Drug use: Never   Sexual activity: Not Currently  Other Topics Concern   Not on file  Social History Narrative   Not on file    Social Drivers of Health        Financial Resource Strain: Low Risk  (07/11/2023)    Overall Financial Resource Strain (CARDIA)     Difficulty of Paying Living Expenses: Not hard at all  Food Insecurity: No Food Insecurity (07/11/2023)     Hunger Vital Sign     Worried About Running Out of Food in the Last Year: Never true     Ran Out of Food in the Last Year: Never true  Transportation Needs: No Transportation Needs (07/11/2023)    PRAPARE - Therapist, art (Medical): No     Lack of Transportation (Non-Medical): No  Physical Activity: Inactive (07/11/2023)    Exercise Vital Sign     Days of Exercise per Week: 0 days     Minutes of Exercise per Session: 0 min  Stress: No Stress Concern Present (07/11/2023)    [employer] of Occupational Health - Occupational Stress Questionnaire     Feeling of Stress : Not at all  Social Connections: Moderately Isolated (07/11/2023)    Social Connection and Isolation Panel     Frequency of Communication with Friends and Family: Twice a week     Frequency of Social Gatherings with Friends and Family: More than three times a week     Attends Religious Services: Never     Database administrator or Organizations: Yes     Attends Engineer, structural: More than 4 times per year     Marital Status: Widowed    No family history on file.     Allergies  No Known Allergies         Current Outpatient Medications  Medication Sig Dispense Refill   albuterol  (PROVENTIL ) (2.5 MG/3ML) 0.083% nebulizer solution INHALE THE CONTENTS OF 1 VIAL VIA NEBULIZER EVERY 6 HOURS AS NEEDED FOR WHEEZING OR SHORTNESS OF BREATH. 150 mL 1   albuterol  (VENTOLIN  HFA) 108 (90 Base) MCG/ACT inhaler Inhale 2 puffs into the lungs every 6 (six) hours as needed for wheezing or shortness of breath. 24 g 1   allopurinol  (ZYLOPRIM ) 100 MG tablet Take 1 tablet (100 mg total) by mouth daily. 90 tablet 1   apixaban  (ELIQUIS ) 2.5 MG TABS tablet TAKE 1 TABLET BY MOUTH 2 TIMES DAILY. 60 tablet 5   buprenorphine  (BUTRANS ) 20 MCG/HR PTWK Place 1 patch onto the skin once a week. 4 patch 2  docusate sodium (COLACE) 100 MG capsule Take 200 mg by mouth daily as needed for mild constipation.        empagliflozin  (JARDIANCE ) 10 MG TABS tablet Take 1 tablet (10 mg total) by mouth daily before breakfast. 90 tablet 3   ezetimibe  (ZETIA ) 10 MG tablet Take 1 tablet (10 mg total) by mouth daily. 90 tablet 1   furosemide  (LASIX ) 40 MG tablet Take 1 tablet (40 mg total) by mouth daily as needed for fluid. 90 tablet 0   gabapentin  (NEURONTIN ) 600 MG tablet Take 2 tablets (1,200 mg total) by mouth at bedtime. 60 tablet 2   Magnesium Oxide (DIASENSE MAGNESIUM PO) Take 1,000 mg by mouth daily at 6 (six) AM.       metoprolol  succinate (TOPROL -XL) 100 MG 24 hr tablet Take 1 tablet (100 mg total) by mouth daily. PLEASE NOTE DECREASED DOSE. 90 tablet 1   Multiple Vitamin (MULTIVITAMIN) tablet Take 1 tablet by mouth daily.       nitroGLYCERIN  (NITROSTAT ) 0.4 MG SL tablet Place 1 tablet (0.4 mg total) under the tongue every 5 (five) minutes as needed for chest pain. If second dose is needed to relieve chest pain, seek immediate emergency medical attention. 25 tablet 1   omeprazole  (PRILOSEC) 20 MG capsule Take 1 capsule (20 mg total) by mouth daily. 90 capsule 1   oxyCODONE -acetaminophen  (PERCOCET) 10-325 MG tablet Take 1 tablet by mouth every 8 (eight) hours as needed for pain. 90 tablet 0   [START ON 11/21/2023] oxyCODONE -acetaminophen  (PERCOCET) 10-325 MG tablet Take 1 tablet by mouth every 8 (eight) hours as needed for pain. 90 tablet 0   potassium chloride  (KLOR-CON  M) 10 MEQ tablet TAKE 1 TABLET (10 MEQ TOTAL) BY MOUTH ONCE DAILY. DO NOT CRUSH OR CHEW. 90 tablet 4   rosuvastatin  (CRESTOR ) 40 MG tablet Take 1 tablet (40 mg total) by mouth at bedtime. 90 tablet 1   tiZANidine  (ZANAFLEX ) 4 MG tablet Take 1 tablet (4 mg total) by mouth every 8 (eight) hours as needed. 90 tablet 5   TRELEGY ELLIPTA  100-62.5-25 MCG/ACT AEPB INHALE 1 PUFF INTO THE LUNGS DAILY. 180 each 1   Vibegron  (GEMTESA ) 75 MG TABS Take 1 tablet (75 mg total) by mouth daily.       Vibegron  (GEMTESA ) 75 MG TABS Take 1 tablet (75 mg total) by  mouth daily. 30 tablet 11   zolpidem  (AMBIEN  CR) 6.25 MG CR tablet TAKE 1 TABLET (6.25 MG TOTAL) BY MOUTH AT BEDTIME AS NEEDED FOR SLEEP. 30 tablet 0      No current facility-administered medications for this visit.      Imaging Results (Last 48 hours)  No results found.     Review of Systems:   A ROS was performed including pertinent positives and negatives as documented in the HPI.   Physical Exam :   Constitutional: NAD and appears stated age Neurological: Alert and oriented Psych: Appropriate affect and cooperative There were no vitals taken for this visit.    Comprehensive Musculoskeletal Exam:     Patient is nonweightbearing with use of a wheelchair.  Left knee appears swollen with a moderate to large effusion.  Diffuse tenderness with palpation.  Active range of motion is from 10 to 40 degrees.  Slightly decreased sensation in the left lower leg.  Ankle dorsiflexion and plantarflexion is intact although elicits pain.  Pedal pulses palpable.   Imaging:   CT left knee from 11/09/2023 Lateral tibial plateau fracture with evidence of some impaction  but no significantly displaced fragments.  Chondrocalcinosis noted within the medial and lateral joint spaces.     I personally reviewed and interpreted the radiographs.          Assessment & Plan Tibial plateau fracture   He has an acute fracture of the left lateral tibial plateau.  Range of motion is improving nicely.  Overall we will plan to see him back as needed for this   Will plan for referral to Dr. Burnetta for right SI injection  Return to clinic as needed

## 2024-03-12 ENCOUNTER — Encounter (HOSPITAL_BASED_OUTPATIENT_CLINIC_OR_DEPARTMENT_OTHER): Payer: Self-pay | Admitting: Physical Therapy

## 2024-03-12 ENCOUNTER — Ambulatory Visit (HOSPITAL_BASED_OUTPATIENT_CLINIC_OR_DEPARTMENT_OTHER): Admitting: Physical Therapy

## 2024-03-12 DIAGNOSIS — M5459 Other low back pain: Secondary | ICD-10-CM | POA: Diagnosis not present

## 2024-03-12 DIAGNOSIS — R2689 Other abnormalities of gait and mobility: Secondary | ICD-10-CM | POA: Diagnosis not present

## 2024-03-12 DIAGNOSIS — M62562 Muscle wasting and atrophy, not elsewhere classified, left lower leg: Secondary | ICD-10-CM | POA: Diagnosis not present

## 2024-03-12 DIAGNOSIS — M25562 Pain in left knee: Secondary | ICD-10-CM

## 2024-03-12 DIAGNOSIS — M25572 Pain in left ankle and joints of left foot: Secondary | ICD-10-CM | POA: Diagnosis not present

## 2024-03-12 DIAGNOSIS — M76822 Posterior tibial tendinitis, left leg: Secondary | ICD-10-CM | POA: Diagnosis not present

## 2024-03-12 DIAGNOSIS — M6281 Muscle weakness (generalized): Secondary | ICD-10-CM | POA: Diagnosis not present

## 2024-03-12 DIAGNOSIS — R262 Difficulty in walking, not elsewhere classified: Secondary | ICD-10-CM | POA: Diagnosis not present

## 2024-03-12 LAB — TOXASSURE SELECT 13 (MW), URINE

## 2024-03-12 NOTE — Therapy (Signed)
 OUTPATIENT PHYSICAL THERAPY LOWER EXTREMITY TREATMENT  Patient Name: Marcial Pless MRN: [id number] DOB:1950/08/22, 73 y.o., male Today's Date: 03/12/2024  END OF SESSION:  PT End of Session - 03/12/24 1148     Visit Number 19    Number of Visits 33    Date for Recertification  04/28/24    Authorization Type [employer] MCR    Authorization Time Period 10 visits approved From 10.14.2025 - 11.29.2025    Authorization - Visit Number 3    Authorization - Number of Visits 10    Progress Note Due on Visit 26    PT Start Time 1148    PT Stop Time 1236    PT Time Calculation (min) 48 min    Behavior During Therapy WFL for tasks assessed/performed                 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)    Medtronic   Myocardial infarction (HCC)    Stroke (HCC)    Past Surgical History:  Procedure Laterality Date   arm surgery Right    broken - pins placed but now removed   COLONOSCOPY     CORONARY ARTERY BYPASS GRAFT     in [place]   EYE SURGERY Bilateral    remove cataracts   HERNIA REPAIR     ICD IMPLANT     Medtronic   INGUINAL HERNIA REPAIR Bilateral 08/19/2023   Procedure: OPEN BILATERAL INGUINAL HERNIA REPAIR WITH MESH;  Surgeon: Rubin Calamity, MD;  Location: [facility] OR;  Service: General;  Laterality: Bilateral;   INGUINAL HERNIA REPAIR Right 02/12/2024   Procedure: OPEN RIGHT INGUINAL HERNIA  WITH MESH;  Surgeon: Rubin Calamity, MD;  Location: [facility] OR;  Service: General;  Laterality: Right;   JOINT REPLACEMENT Right    shoulder in [place]   MANDIBLE SURGERY     lower jaw surgery for placement of dentures per patient   MULTIPLE TOOTH EXTRACTIONS     all teeth pulled - dentures   TONSILLECTOMY     UPPER GI ENDOSCOPY     WISDOM TOOTH EXTRACTION     Patient Active Problem List   Diagnosis Date Noted   Tibial plateau fracture, left 11/19/2023   COVID 07/22/2023   Bilateral recurrent inguinal hernia without obstruction or gangrene 05/09/2023   Lumbar radiculopathy 03/26/2023   Diplopia 06/26/2022   Hyperkalemia 04/03/2022   Paroxysmal atrial fibrillation (HCC) 02/11/2022   Cerebral infarction, unspecified (HCC) 01/30/2022   Other idiopathic scoliosis, thoracolumbar region 12/21/2021   Arthritis of sacroiliac joint of both sides 12/21/2021   Piriformis syndrome of both sides 12/21/2021   Hx of shoulder surgery (left rotator cuff surgery, right rotator cuff then reverse total shoulder arthoplasty) 12/07/2021   Lumbar facet arthropathy 12/07/2021   Bilateral primary osteoarthritis of knee 12/07/2021   Sacroiliac joint pain 12/07/2021  Gastroesophageal reflux disease 10/27/2021   S/P CABG (coronary artery bypass graft) 05/26/2021   ICD (implantable cardioverter-defibrillator) in place 05/26/2021   COPD (chronic obstructive pulmonary disease) (HCC) 05/26/2021   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   Insomnia 05/26/2021   Atherosclerotic heart disease of native coronary artery with unspecified angina pectoris 05/26/2021   BPH (benign prostatic hyperplasia) 03/25/2021   Stage 3b chronic kidney disease (HCC) 06/16/2018   Hypertension 04/03/2018   Hyperlipidemia 04/03/2018   Chronic pain 04/03/2018   Chronic systolic congestive heart failure (HCC) 04/03/2018   Gout 04/03/2018   Renal artery stenosis 04/03/2018   Thrombocytopenia 04/03/2018   Anemia in chronic kidney disease  04/03/2018   Ischemic dilated cardiomyopathy (HCC) 06/29/2016   VT (ventricular tachycardia) (HCC) 06/28/2016    PCP:  Chandra Toribio POUR, MD     REFERRING PROVIDER:  Chandra Toribio POUR, MD  12/24/23 LLE : Dr Elspeth Parker   REFERRING DIAG:  M54.16 (ICD-10-CM) - Lumbar radiculopathy  8/5/25Closed fx of left tibeal plateau    Rationale for Evaluation and Treatment: Rehabilitation  THERAPY DIAG:  Acute pain of left knee  Muscle weakness (generalized)  Other low back pain  ONSET DATE: >10 yrs  SUBJECTIVE:                                                                                                                                                                                           SUBJECTIVE STATEMENT: Pt reports many problems this year. Leg is getting better and so is his foot. Back is bothering him the most. Continued difficulty walking due to low back symptoms. Wants to begin working [employer] more and wants to work on exercises at home more due to finances.    Eval: Pt reports LBP and R buttocks pain. Injections into the back and piriformis are hit or miss in terms of relief. They are too expensive. Pt does have a [employer] membership. Pt has been dealing with this condition for quite some time with multiple episodes of PT and chiro. Pain will down into the posterior and will be just above the knee and will start having R calf cramps. Pt is unable to walk a mile like he used to , he is only down to .25 miles now with shooting. Pt is limited to 15 mins of sitting before pain starts. Denies cancer red flags.   PERTINENT HISTORY:  CHF, CAD, COPD, HTN, CKD, recent hernia repair, R TSA,   PAIN:  Are you having pain? Yes: NPRS scale: 2/10 when sitting, up to 10/10 when walking Pain location: R QL, middle lower back, sacral/glute on R> L Pain description: sharp  Aggravating factors: extension, sitting for too long,   Relieving factors: chiro, resting, meds, movement, lumbar support      PRECAUTIONS: ICD/Pacemaker last shocked registered on Oct 14, 2023  RED FLAGS: None   WEIGHT BEARING RESTRICTIONS: No WBAT 12/16/23  FALLS:  Has patient fallen in last 6 months? Yes. Number of falls 3, Off ladder; off bed; in parking garage  LIVING ENVIRONMENT: Lives with: lives with their family, sister Lives in: House/apartment Stairs: to enter Has following equipment at home: has walking stick and walker available at home, does not use   OCCUPATION: retired   PLOF: Independent with basic ADLs  PATIENT GOALS: relieve back pain; wants to hike, wants to build out his mini camper to travel   12/24/23: heal to finish his mini camper and travel  OBJECTIVE:  Note: Objective measures were completed at Evaluation unless otherwise noted.  DIAGNOSTIC FINDINGS:  CT left knee from 11/09/2023 Lateral tibial plateau fracture with evidence of some impaction but no significantly displaced fragments.  Chondrocalcinosis noted within the medial and lateral joint spaces.    Moderate atherosclerotic calcifications within the abdominal aorta. A vascular stent overlies the L1-2 disc level.   IMPRESSION: 1. Moderate to severe levocurvature centered at L2. 2. Severe multilevel degenerative disc and endplate changes. 3. Mild retrolisthesis of L1 on L2.  PATIENT SURVEYS:  ODI: ODI Score = 21 points (42%) 9/3 = 23 10/14 18/50 ( 36% disability( Interpretation: 41% to 60% (severe disability): Pain is a primary problem for these patients, but they may also be experiencing significant problems in travel, personal care, social life, sexual activity and sleep. A detailed evaluation is appropriate.  10/14   12/24/23 LEFS: 30/80 9/3 = 44/80 10/14 52/80     COGNITION:   Overall cognitive status: WFL              SENSATION: NT into R thigh, neuropathy into distal LE      L knee in knee immobilizer  10/14: numbness and tingling left ankle and foot   LOWER EXTREMITY ROM:   Active ROM  Right eval Left eval Left  01/22/24 Left 10/14   Hip flexion        Hip extension        Hip abduction        Hip adduction        Hip internal rotation        Hip external rotation        Knee flexion   100 120d 125  Knee extension   -15 -9 active -7 prior to manual therapy 0 after   Ankle dorsiflexion   Limited by edema  15   Ankle plantarflexion   Limited by edema 40   Ankle inversion       Ankle eversion         (Blank rows = not tested)  LOWER EXTREMITY MMT:    MMT Right eval Left eval  Hip flexion    Hip extension    Hip abduction    Hip adduction    Hip internal rotation    Hip external rotation    Knee flexion    Knee extension    Ankle dorsiflexion    Ankle plantarflexion  N/T  Ankle inversion  3+  Ankle eversion  3+   (Blank rows = not tested)             GAIT:  Ambulating without device: lateral shift away from left LE    Observation: scoliotic  curves noted in lumbar and thoracic spine    Palpation:  Area of tenderness in the navicular area  Medial calf tightness and soreness     [facility] Adult PT Treatment:                                                DATE:  03/12/24 TTP most in R glutes/piriformis, mild in lumbar paraspinals Gait: trendelenberg on R stance Hip extension MMT 4/5 bilateral Review of HEPand discussion of POC Discussion of symptoms, relevant anatomy, pathology Standing hip abduction 1 x 10 Sidelying clam RTB 1 x 10 Mini squat 1 x 10  03/10/24 Therapeutic exercise: -NuStep L4/5, LE only x 6 min -Seated piriformis stretch (some pain with stretch in sitting, and in transitioning to stretch) -Supine bridge x 10 with cues for arm press, glute squeeze and TrA set, monitoring tolerance -Seated hamstring R - not tolerate, Lt tolerated; Hooklying Rt hamstring stretch with strap - not tolerated; standing hamstring stretch with foot on 6 step - tolerated!   -Rt/Lt hip flexor stretch at counter and seated with LE off table  edge Manual therapy:  (pt in Lt sidelying)- TPR to Rt piriformis, deep hip rotators, glute med (pt very sensitive to light touch to these areas Self care: ball massage to R glute and lower back in standing  10/14 Manual: Trigger point release to medial gastroc and trigger point release to medial arch  Grade I and II PA and AP mobilizations for extension  Review of self soft tissue mobilization   Performed re- assessment on patients ankle / lower leg   02/10/24 Calf stretch ins tanding 20sec x3L Partial squats at rail x15 Side stepping along rail x2laps L knee PROM Roller to L calf, STM Supine SLR (low range) x15L Sidelying hip abduction 2x10 bil LE Standing HR 2x10 DL Instructed in self IASTM to R hip and L calf  01/31/24 Pt seen for aquatic therapy today.  Treatment took place in water 3.5-4.75 ft in depth at the [facility] pool. Temp of water was 91.  Pt entered/exited the pool via stairs with heavy ue support then exits via lift   *Unsupported walking forward/ backward with cues for heel/toe and toe/heel pattern * unsupported side stepping  *forward march with kick  * UE support yellow HB:  heel/toe raises x 15 *toe walking forward then back *heel walking forward then back *HB carry RBHB forward and back unilaterally then bilaterally 3.56ft * L stretch at pool wall / trunk ext stretch 3.6 ft * UE on yellow hand floats: tandem gait forward/ backward 3.6 ft * plank position with hands on bench in water and alternating hip ext *seated: LAQ *forward step up leading R/L 2 x 5 * staggered then wide stance with solid black noodle pull down to thighs for core engagement x 5-10 * return to walking forward/backward with UE unsupported    9/15  Calf stretch with quad set 3s 2x10 Knee flexion gapping 2x10 2s LAQ GTB 3x10 GTB HS curl 3x10 Bridge with blue band 3x10 2 lateral step down 3x8 Standing HR 2x20 DL     [facility] Adult PT Treatment:  DATE: 01/29/24 Pt seen for aquatic therapy today.  Treatment took place in water 3.5-4.75 ft in depth at the [facility] pool. Temp of water was 91.  Pt entered/exited the pool via stairs with heavy ue support then exits via lift    *Unsupported walking forward, backward and side stepping in 3.6 -4.0 - multiple laps of each *step ups onto bottom step leading R/L 2 x 5.  Cues for TKE left with rising. (Some discomfort initially but reduces with reps) *lateral step ups left x 8 *alternating step downs 2 x 5 (no pain) *L stretch *forward walking with small knee kick *Solid noodle pull down for TrA engagement wide stance then staggered x 10     PATIENT EDUCATION:    Education details: exercise rationale /progression/ modification,  Person educated: Patient Education method: Programmer, multimedia, Facilities manager, Verbal cues,  Education comprehension: verbalized understanding, returned demonstration, and verbal cues required   HOME EXERCISE PROGRAM:    Access Code: [email or web address] URL: [email or web address] Date: 10/29/2023 Prepared by: Dale Call  Aquatic TBA   Access Code: [facility] URL: [email or web address] Date: 12/24/2023 Prepared by: Matilda Kohut   Exercises - Seated Quad Set  - 3-5 x daily - 7 x weekly - 1 sets - 5-10 reps - Long Sitting Quad Set  - 3-5 x daily - 7 x weekly - 1 sets - 5-10 reps - Supine Hip Abduction  - 3-5 x daily - 7 x weekly - 3-5 sets - 5-10 reps - Straight Leg Raise  - 3-5 x daily - 7 x weekly - 3-5 sets - 5-10 reps     ASSESSMENT:   CLINICAL IMPRESSION: Patient with continued lumbar spine/glute symptoms. He remains tender and hypersensitive in glutes and lumbar paraspinals but no significant trigger points. Patient with scoliosis and lateral shift evident in gait along with Trendelenburg on R stance. Patient low back and glute symptoms with walking likely related to glute weakness and motor control  deficits. Discussed symptoms, low back/hip pathology. Patient with financial limitations, discussed decreased frequency of POC to allow for more completion of HEP and going to [facility]. Review of prior HEP and gave updated glute exercises that patient will complete for 4-6 weeks and then return to clinic for modification/addition of HEP PRN. Patient will continue to benefit from physical therapy in order to improve function and reduce impairment.   PN: pt using SPC when out of home only. He reports left foot is limiting his amb > than LB or left knee.  He has been using frozen water bottle which has provided releif for ~ 1 hour after completed to plantar aspect left foot. He reports left knee pain has improved by 85% since onset. LBP has had no measureable change. ODI demonstrates a small increase in disability likely due to addition of tibeal plateau fx and use of crutch     Initial impression (tib plat fx) Patient is a 73 y.o. m who was seen today for physical therapy evaluation and treatment for Closed fracture of lateral portion of left tibial plateau. He is WBAT as per Md as fx is stable. He has been being seen also for LBP which is now on hold due to new fx.  He presents today for aquatic therapy intervention.  He is amb with fww.  Fww height adjusted as he maneuvers 400 ft ot pool setting without difficulty.  His fx is stabile not requiring surgical intervention. Has knee immobilizer that he is wearing when out of home. He did drive himself here  today. He is now WBAT.  Reports climbing multiple steps daily to bed and bath.  Pt edu on proper technique for stair climbing using crutches. Objective testing demonstrates left knee ROM limited in both flex and extension       OBJECTIVE IMPAIRMENTS: Abnormal gait, decreased activity tolerance, decreased balance, decreased coordination, decreased endurance, decreased mobility, difficulty walking, decreased ROM, decreased strength, hypomobility, increased  edema, increased fascial restrictions, impaired perceived functional ability, impaired flexibility, improper body mechanics, and pain.    ACTIVITY LIMITATIONS: carrying, lifting, bending, sitting, standing, squatting, stairs, transfers, and locomotion level   PARTICIPATION LIMITATIONS: meal prep, cleaning, laundry, interpersonal relationship, driving, shopping, community activity, and yard work   PERSONAL FACTORS: PMH - chf, macular degeneration, DM, cervical CA are also affecting patient's functional outcome.    REHAB POTENTIAL: fair   CLINICAL DECISION MAKING: complicated/unstable   EVALUATION COMPLEXITY: moderate     GOALS:   SHORT TERM GOALS: Target date: 12/10/2023     Pt will become independent with HEP in order to demonstrate synthesis of PT education.     Goal status: Met 12/30/22    2. Pt will demonstrate at least a 12.8 improvement in Oswestry Index in order to demonstrate a clinically significant change in LBP and function.   Status: see above  Goal status: In progress -01/22/24     LONG TERM GOALS: Target date: 03/06/2024        Pt  will become independent with final HEP in order to demonstrate synthesis of PT education.    Goal status: INITIAL   2.  Pt will demonstrate at least a 25pt improvement in Oswestry Index in order to demonstrate a clinically significant change in LBP and function. Goal status: In progress 01/22/24   3.  Pt will be able to demonstrate/report ability to sit/stand/sleep for extended periods of time without pain in order to demonstrate functional improvement and tolerance to static positioning.    Goal status: In progress 02/11/24   4. Pt will be able to demonstrate/report ability to walk >25 mins without pain in order to demonstrate functional improvement and tolerance to exercise and community mobility.     Goal status: INITIAL     5. Pt to improve on LEFS by at least 9 point  (MCID)to demonstrate statistically significant Improvement in  function.   Baseline: 30/80; 44/80   Goals Status: Met 01/22/24   6. Pt will improve left knee extension to 0d and flex to at least 120d   Baseline: See chart   Goal Status: In progress 01/22/24   7. Pt will amb using SPC community distances without limitation of knee pain   Baseline: Using fww x 400 ft; progressed to single crutch   Goal status:  Met 01/22/24 (pt limited by left foot pain not knee)  8. Pt will report decrease in left knee pain by at least 75% for improved toleration to functional mobility/amb  Baseline:  Goal status: Met 02/11/24 (85)  PLAN:   PLAN:   PT FREQUENCY: 1-2x/week   PT DURATION: 6 weeks    PLANNED INTERVENTIONS: Therapeutic exercises, Therapeutic activity, Neuromuscular re-education, Balance training, Gait training, Patient/Family education, Joint manipulation, Joint mobilization, Stair training, , DME instructions, Aquatic Therapy, Dry Needling, Electrical stimulation unattended, Spinal manipulation, Spinal mobilization, Cryotherapy, Moist heat, scar mobilization, Splintting, Taping, Vasopneumatic device, Traction, Ultrasound, Ionotophoresis 4mg /ml Dexamethasone , Manual therapy.   PLAN FOR NEXT SESSION: mobilizations; weight room progression, lumbar mobility 12/24/23: aquatic: gait, balance, core and LE strengthening; stretching, pain management  Trigger point release to calf and medial arch, knee extension strengthening. LE strengthening    Prentice GORMAN Stains, PT, DPT 03/12/2024, 1:00 PM

## 2024-03-13 NOTE — Progress Notes (Signed)
 Remote ICD Transmission,mote

## 2024-03-13 NOTE — Telephone Encounter (Signed)
   Patient Name: Andrew Townsend  DOB: May 18, 1951 MRN: [id number]  Primary Cardiologist: Shelda Bruckner, MD  Clinical pharmacists have reviewed the patient's past medical history, labs, and current medications as part of preoperative protocol coverage. The following recommendations have been made:  Patient with diagnosis of afib on Eliquis  for anticoagulation.     What type of surgery is being performed?     Colonoscopy   When is this surgery scheduled?     04/03/24    CHA2DS2-VASc Score = 6   This indicates a 9.7% annual risk of stroke. The patient's score is based upon: CHF History: 1 HTN History: 1 Diabetes History: 0 Stroke History: 2 Vascular Disease History: 1 Age Score: 1 Gender Score: 0     CrCl 42 ml/min Platelet count 181 K   Patient has not had an Afib/aflutter ablation or Watchman within the last 3 months or DCCV within the last 30 days   Per office protocol, patient can hold Eliquis  for 2 days prior to procedure.     I will route this recommendation to the requesting party via Epic fax function and remove from pre-op pool.  Please call with questions.  Lamarr Satterfield, NP 03/13/2024, 3:22 PM

## 2024-03-13 NOTE — Telephone Encounter (Signed)
 Patient with diagnosis of afib on Eliquis  for anticoagulation.    What type of surgery is being performed?     Colonoscopy   When is this surgery scheduled?     04/03/24   CHA2DS2-VASc Score = 6   This indicates a 9.7% annual risk of stroke. The patient's score is based upon: CHF History: 1 HTN History: 1 Diabetes History: 0 Stroke History: 2 Vascular Disease History: 1 Age Score: 1 Gender Score: 0    CrCl 42 ml/min Platelet count 181 K  Patient has not had an Afib/aflutter ablation or Watchman within the last 3 months or DCCV within the last 30 days  Per office protocol, patient can hold Eliquis  for 2 days prior to procedure.    Patient will not need bridging with Lovenox (enoxaparin) around procedure.  **This guidance is not considered finalized until pre-operative APP has relayed final recommendations.**

## 2024-03-15 NOTE — Progress Notes (Signed)
 Agree with the assessment and plan as outlined by Brigitte Canard, PA-C.

## 2024-03-17 ENCOUNTER — Encounter (HOSPITAL_BASED_OUTPATIENT_CLINIC_OR_DEPARTMENT_OTHER): Admitting: Physical Therapy

## 2024-03-18 ENCOUNTER — Encounter: Payer: Self-pay | Admitting: Sports Medicine

## 2024-03-18 ENCOUNTER — Other Ambulatory Visit: Payer: Self-pay

## 2024-03-18 ENCOUNTER — Ambulatory Visit: Admitting: Sports Medicine

## 2024-03-18 ENCOUNTER — Other Ambulatory Visit: Payer: Self-pay | Admitting: Nurse Practitioner

## 2024-03-18 DIAGNOSIS — M533 Sacrococcygeal disorders, not elsewhere classified: Secondary | ICD-10-CM | POA: Diagnosis not present

## 2024-03-18 DIAGNOSIS — M7918 Myalgia, other site: Secondary | ICD-10-CM

## 2024-03-18 DIAGNOSIS — G8929 Other chronic pain: Secondary | ICD-10-CM

## 2024-03-18 NOTE — Progress Notes (Signed)
 Andrew Townsend - 73 y.o. male MRN [id number]  Date of birth: 02-05-1951  Office Visit Note: Visit Date: 03/18/2024 PCP: Chandra Toribio POUR, MD Referred by: Chandra Toribio POUR, MD  Subjective: Chief Complaint  Patient presents with   Lower Back - Pain   HPI: Andrew Townsend is a pleasant 73 y.o. male who presents today for chronic right SI-joint pain.  [name] has been experiencing some right sided low back and SI joint pain.  He has had injections with PMR (Dr. Marcelino) in the past, the last was a year ago or more.  He has been recovering from his tibial plateau fracture and does feel he has put more stress to the SI joint region.  He is interested in injection today.  In terms of medication, he currently using oxycodone  10 mg every 8 hours as needed for pain management.  Pertinent ROS were reviewed with the patient and found to be negative unless otherwise specified above in HPI.   Assessment & Plan: Visit Diagnoses:  1. Chronic right SI joint pain   2. Right buttock pain    Plan: Impression is acute on chronic right sided SI pain with low back and reciprocal buttock pain.  He has had this for many years and has had infrequent injections in the past, the last was a year or more.  Through shared decision making, we did proceed with ultrasound-guided right SI joint injection, patient tolerated well.  Advised on postinjection protocol.  He may use ice/heat, Tylenol  for any postinjection pain.  He may continue his oxycodone  10 mg every 8 hours as needed for this and his overall CP. F/u as needed.  Follow-up: Return if symptoms worsen or fail to improve.   Meds & Orders: No orders of the defined types were placed in this encounter.   Orders Placed This Encounter  Procedures   US  Guided Needle Placement - No Linked Charges     Procedures: U/S-guided SI-joint injection, Right   After discussion of risk/benefits/indications, informed verbal consent was obtained. A timeout was then performed. The patient  was positioned in a prone position on exam room table with a pillow placed under the pelvis for mild hip flexion. The SI joint area was cleaned and prepped with betadine and alcohol swabs. Sterile ultrasound gel was applied and the ultrasound transducer was placed in an anatomic axial plane over the PSIS, then moved distally over the SI-joint. Using ultrasound guidance, a 22-gauge, 3.5 needle was inserted from a medial to lateral approach utilizing an in-plane approach and directed into the SI-joint. The SI-joint was then injected with a mixture of 4:1.5 lidocaine :depomedrol with visualization of the injectate flow into the SI-joint under ultrasound visualization. The patient tolerated the procedure well without immediate complications.       Clinical History: No specialty comments available.  He reports that he quit smoking about 10 years ago. His smoking use included e-cigarettes and cigarettes. He uses smokeless tobacco.  Recent Labs    10/15/23 0912  LABURIC 3.6*    Objective:    Physical Exam  Gen: Well-appearing, in no acute distress; non-toxic CV: Well-perfused. Warm.  Resp: Breathing unlabored on room air; no wheezing. Psych: Fluid speech in conversation; appropriate affect; normal thought process  Ortho Exam - Low back/SI-joint: + Positive TTP over the right SI joint region just inferior to the PSIS. + Fortin's point test and SI-joint compression.   Imaging: No results found.  Past Medical/Family/Surgical/Social History: Medications & Allergies reviewed per EMR, new medications  updated. Patient Active Problem List   Diagnosis Date Noted   Tibial plateau fracture, left 11/19/2023   COVID 07/22/2023   Bilateral recurrent inguinal hernia without obstruction or gangrene 05/09/2023   Lumbar radiculopathy 03/26/2023   Diplopia 06/26/2022   Hyperkalemia 04/03/2022   Paroxysmal atrial fibrillation (HCC) 02/11/2022   Cerebral infarction, unspecified (HCC) 01/30/2022   Other  idiopathic scoliosis, thoracolumbar region 12/21/2021   Arthritis of sacroiliac joint of both sides 12/21/2021   Piriformis syndrome of both sides 12/21/2021   Hx of shoulder surgery (left rotator cuff surgery, right rotator cuff then reverse total shoulder arthoplasty) 12/07/2021   Lumbar facet arthropathy 12/07/2021   Bilateral primary osteoarthritis of knee 12/07/2021   Sacroiliac joint pain 12/07/2021   Gastroesophageal reflux disease 10/27/2021   S/P CABG (coronary artery bypass graft) 05/26/2021   ICD (implantable cardioverter-defibrillator) in place 05/26/2021   COPD (chronic obstructive pulmonary disease) (HCC) 05/26/2021   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   Insomnia 05/26/2021   Atherosclerotic heart disease of native coronary artery with unspecified angina pectoris 05/26/2021   BPH (benign prostatic hyperplasia) 03/25/2021   Stage 3b chronic kidney disease (HCC) 06/16/2018   Hypertension 04/03/2018   Hyperlipidemia 04/03/2018   Chronic pain 04/03/2018   Chronic systolic congestive heart failure (HCC) 04/03/2018   Gout 04/03/2018   Renal artery stenosis 04/03/2018   Thrombocytopenia 04/03/2018   Anemia in chronic kidney disease 04/03/2018   Ischemic dilated cardiomyopathy (HCC) 06/29/2016   VT (ventricular tachycardia) (HCC) 06/28/2016   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)    Medtronic    Myocardial infarction (HCC)    Stroke (HCC)    Family History  Problem Relation Age of Onset   Colon cancer Neg Hx    Esophageal cancer Neg Hx    Pancreatic cancer Neg Hx    Prostate cancer Neg Hx    Past Surgical History:  Procedure Laterality Date   arm surgery Right    broken - pins placed but now removed   COLONOSCOPY     CORONARY ARTERY BYPASS GRAFT     in [place]   EYE SURGERY Bilateral    remove cataracts   HERNIA REPAIR     ICD IMPLANT     Medtronic   INGUINAL HERNIA REPAIR Bilateral 08/19/2023   Procedure: OPEN BILATERAL INGUINAL HERNIA REPAIR WITH MESH;  Surgeon: Rubin Calamity, MD;  Location: [facility] OR;  Service: General;  Laterality: Bilateral;   INGUINAL HERNIA REPAIR Right 02/12/2024   Procedure: OPEN RIGHT INGUINAL HERNIA WITH MESH;  Surgeon: Rubin Calamity, MD;  Location: [facility] OR;  Service: General;  Laterality: Right;   JOINT REPLACEMENT Right    shoulder in [place]   MANDIBLE SURGERY     lower jaw surgery for placement of dentures per patient   MULTIPLE TOOTH EXTRACTIONS     all teeth pulled - dentures  TONSILLECTOMY     UPPER GI ENDOSCOPY     WISDOM TOOTH EXTRACTION     Social History   Occupational History   Not on file  Tobacco Use   Smoking status: Former    Types: E-cigarettes, Cigarettes    Quit date: 2015    Years since quitting: 10.8   Smokeless tobacco: Current   Tobacco comments:    Quit smoking cigarettes in 2015 - 1 ppd  Vaping Use   Vaping status: Every Day   Substances: Nicotine  Substance and Sexual Activity   Alcohol use: Not Currently   Drug use: Never   Sexual activity: Not Currently

## 2024-03-19 ENCOUNTER — Encounter (HOSPITAL_BASED_OUTPATIENT_CLINIC_OR_DEPARTMENT_OTHER): Admitting: Physical Therapy

## 2024-03-23 ENCOUNTER — Encounter: Payer: Self-pay | Admitting: Radiology

## 2024-03-26 ENCOUNTER — Ambulatory Visit: Admitting: Sports Medicine

## 2024-03-30 ENCOUNTER — Telehealth: Payer: Self-pay | Admitting: Physician Assistant

## 2024-03-30 NOTE — Telephone Encounter (Signed)
  1. Has your device fired? no  2. Is you device beeping? no  3. Are you experiencing draining or swelling at device site? no  4. Are you calling to see if we received your device transmission? no  5. Have you passed out? No Pt is having trouble with his lights blinking on his box. He has unplugged it and it is still doing it.   Please route to Device Clinic Pool

## 2024-04-01 ENCOUNTER — Other Ambulatory Visit: Payer: Self-pay

## 2024-04-01 ENCOUNTER — Telehealth: Payer: Self-pay | Admitting: Nurse Practitioner

## 2024-04-01 MED ORDER — GABAPENTIN 600 MG PO TABS: 1200.0000 mg | ORAL_TABLET | Freq: Every day | ORAL | 2 refills | Status: DC

## 2024-04-01 NOTE — Telephone Encounter (Signed)
 Patient states he needs refills on his gabapentin . He would like to be able to pick up 90 supply at a time instead of monthly. This was not refilled when he was here 03-09-24.

## 2024-04-01 NOTE — Telephone Encounter (Signed)
 Patietn asking for refill on Gabapentin , would like a 90day supply.

## 2024-04-01 NOTE — Telephone Encounter (Signed)
 Refill sent to [facility]

## 2024-04-03 ENCOUNTER — Ambulatory Visit (AMBULATORY_SURGERY_CENTER): Admitting: Gastroenterology

## 2024-04-03 ENCOUNTER — Encounter: Payer: Self-pay | Admitting: Gastroenterology

## 2024-04-03 VITALS — BP 159/76 | HR 62 | Temp 98.2°F | Resp 12 | Ht 66.0 in | Wt 126.0 lb

## 2024-04-03 DIAGNOSIS — D122 Benign neoplasm of ascending colon: Secondary | ICD-10-CM | POA: Diagnosis not present

## 2024-04-03 DIAGNOSIS — Z860100 Personal history of colon polyps, unspecified: Secondary | ICD-10-CM

## 2024-04-03 DIAGNOSIS — D120 Benign neoplasm of cecum: Secondary | ICD-10-CM

## 2024-04-03 DIAGNOSIS — Q439 Congenital malformation of intestine, unspecified: Secondary | ICD-10-CM | POA: Diagnosis not present

## 2024-04-03 DIAGNOSIS — K5730 Diverticulosis of large intestine without perforation or abscess without bleeding: Secondary | ICD-10-CM | POA: Diagnosis not present

## 2024-04-03 DIAGNOSIS — Z1211 Encounter for screening for malignant neoplasm of colon: Secondary | ICD-10-CM | POA: Diagnosis not present

## 2024-04-03 DIAGNOSIS — Z860101 Personal history of adenomatous and serrated colon polyps: Secondary | ICD-10-CM | POA: Diagnosis not present

## 2024-04-03 MED ORDER — SODIUM CHLORIDE 0.9 % IV SOLN: 500.0000 mL | Freq: Once | INTRAVENOUS | Status: DC

## 2024-04-03 NOTE — Patient Instructions (Signed)
 Resume previous diet. Recommend high fiber diet or daily fiber supplement to reduce risk of diverticular complications.  Resume Eliquis  (apixaban ) at prior dose tomorrow.  Resume present medications.  Awaiting pathology results.  Handout provided on diverticulosis  YOU HAD AN ENDOSCOPIC PROCEDURE TODAY AT THE [facility]:   Refer to the procedure report that was given to you for any specific questions about what was found during the examination.  If the procedure report does not answer your questions, please call your gastroenterologist to clarify.  If you requested that your care partner not be given the details of your procedure findings, then the procedure report has been included in a sealed envelope for you to review at your convenience later.  YOU SHOULD EXPECT: Some feelings of bloating in the abdomen. Passage of more gas than usual.  Walking can help get rid of the air that was put into your GI tract during the procedure and reduce the bloating. If you had a lower endoscopy (such as a colonoscopy or flexible sigmoidoscopy) you may notice spotting of blood in your stool or on the toilet paper. If you underwent a bowel prep for your procedure, you may not have a normal bowel movement for a few days.  Please Note:  You might notice some irritation and congestion in your nose or some drainage.  This is from the oxygen used during your procedure.  There is no need for concern and it should clear up in a day or so.  SYMPTOMS TO REPORT IMMEDIATELY:  Following lower endoscopy (colonoscopy or flexible sigmoidoscopy):  Excessive amounts of blood in the stool  Significant tenderness or worsening of abdominal pains  Swelling of the abdomen that is new, acute  Fever of 100F or higher  For urgent or emergent issues, a gastroenterologist can be reached at any hour by calling [phone]. Do not use MyChart messaging for urgent concerns.    DIET:  We do recommend a small meal at  first, but then you may proceed to your regular diet.  Drink plenty of fluids but you should avoid alcoholic beverages for 24 hours.  ACTIVITY:  You should plan to take it easy for the rest of today and you should NOT DRIVE or use heavy machinery until tomorrow (because of the sedation medicines used during the test).    FOLLOW UP: Our staff will call the number listed on your records the next business day following your procedure.  We will call around 7:15- 8:00 am to check on you and address any questions or concerns that you may have regarding the information given to you following your procedure. If we do not reach you, we will leave a message.     If any biopsies were taken you will be contacted by phone or by letter within the next 1-3 weeks.  Please call us  at [phone] if you have not heard about the biopsies in 3 weeks.    SIGNATURES/CONFIDENTIALITY: You and/or your care partner have signed paperwork which will be entered into your electronic medical record.  These signatures attest to the fact that that the information above on your After Visit Summary has been reviewed and is understood.  Full responsibility of the confidentiality of this discharge information lies with you and/or your care-partner.

## 2024-04-03 NOTE — Progress Notes (Signed)
 Pt's states no medical or surgical changes since previsit or office visit.

## 2024-04-03 NOTE — Op Note (Signed)
 [facility] Patient Name: Andrew Townsend Procedure Date: 04/03/2024 1:43 PM MRN: [id number] Endoscopist: Glendia E. Stacia , MD, [id number] Age: 73 Referring MD:  Date of Birth: 02/17/1951 Gender: Male Account #: [id number] Procedure:                Colonoscopy Indications:              High risk colon cancer surveillance: Personal                            history of multiple (3 or more) adenomas Medicines:                Monitored Anesthesia Care Procedure:                Pre-Anesthesia Assessment:                           - Prior to the procedure, a History and Physical                            was performed, and patient medications and                            allergies were reviewed. The patient's tolerance of                            previous anesthesia was also reviewed. The risks                            and benefits of the procedure and the sedation                            options and risks were discussed with the patient.                            All questions were answered, and informed consent                            was obtained. Prior Anticoagulants: The patient has                            taken Eliquis  (apixaban ), last dose was 2 days                            prior to procedure. ASA Grade Assessment: III - A                            patient with severe systemic disease. After                            reviewing the risks and benefits, the patient was                            deemed in satisfactory condition to undergo the  procedure.                           After obtaining informed consent, the colonoscope                            was passed under direct vision. Throughout the                            procedure, the patient's blood pressure, pulse, and                            oxygen saturations were monitored continuously. The                            [id number]-HQ190L Colonoscope [id number] was introduced                             through the anus and advanced to the the cecum,                            identified by appendiceal orifice and ileocecal                            valve. The colonoscopy was somewhat difficult due                            to multiple diverticula in the colon, restricted                            mobility of the colon and a tortuous colon. The                            patient tolerated the procedure well. The quality                            of the bowel preparation was adequate. The                            ileocecal valve, appendiceal orifice, and rectum                            were photographed. The bowel preparation used was                            SUPREP via split dose instruction. Scope In: 2:04:40 PM Scope Out: 2:31:22 PM Scope Withdrawal Time: 0 hours 15 minutes 9 seconds  Total Procedure Duration: 0 hours 26 minutes 42 seconds  Findings:                 The perianal and digital rectal examinations were                            normal. Pertinent negatives include normal  sphincter tone and no palpable rectal lesions.                           A 2 mm polyp was found in the cecum. The polyp was                            sessile. The polyp was removed with a cold snare.                            Resection and retrieval were complete. Estimated                            blood loss was minimal.                           Two sessile polyps were found in the ascending                            colon. The polyps were 2 to 4 mm in size. These                            polyps were removed with a cold snare. Resection                            and retrieval were complete. Estimated blood loss                            was minimal.                           Many large-mouthed and small-mouthed diverticula                            were found in the sigmoid colon, descending colon                            and  ascending colon. There was no evidence of                            diverticular bleeding.                           The exam was otherwise normal throughout the                            examined colon.                           The retroflexed view of the distal rectum and anal                            verge was normal and showed no anal or rectal  abnormalities. Complications:            No immediate complications. Estimated Blood Loss:     Estimated blood loss was minimal. Impression:               - One 2 mm polyp in the cecum, removed with a cold                            snare. Resected and retrieved.                           - Two 2 to 4 mm polyps in the ascending colon,                            removed with a cold snare. Resected and retrieved.                           - Severe diverticulosis in the sigmoid colon, in                            the descending colon and in the ascending colon.                            There was no evidence of diverticular bleeding.                           - The distal rectum and anal verge are normal on                            retroflexion view. Recommendation:           - Patient has a contact number available for                            emergencies. The signs and symptoms of potential                            delayed complications were discussed with the                            patient. Return to normal activities tomorrow.                            Written discharge instructions were provided to the                            patient.                           - Resume previous diet.                           - Resume Eliquis  (apixaban ) at prior dose tomorrow.                           - Await pathology  results.                           - Based on patient's age, comorbidities and lack of                            high risk polyps, recommend against further colon                             cancer screening/polyp surveillance.                           - Recommend high fiber diet or daily fiber                            supplement to reduce risk of diverticular                            complications. Jaamal Farooqui E. Stacia, MD 04/03/2024 2:40:54 PM This report has been signed electronically.

## 2024-04-03 NOTE — Progress Notes (Signed)
 Vss nad trans to pacu

## 2024-04-03 NOTE — Progress Notes (Signed)
 Called to room to assist during endoscopic procedure.  Patient ID and intended procedure confirmed with present staff. Received instructions for my participation in the procedure from the performing physician.

## 2024-04-03 NOTE — Progress Notes (Signed)
 History and Physical Interval Note:  04/03/2024 1:17 PM  Andrew Townsend  has presented today for endoscopic procedure(s), with the diagnosis of No diagnosis found..  The various methods of evaluation and treatment have been discussed with the patient and/or family. After consideration of risks, benefits and other options for treatment, the patient has consented to  the endoscopic procedure(s).   The patient's history has been reviewed, patient examined, no change in status, stable for endoscopic procedure(s).  I have reviewed the patient's chart and labs.  Questions were answered to the patient's satisfaction.    Last dose of Eliquis  11/12   [name] E. Stacia, MD [facility]

## 2024-04-06 ENCOUNTER — Telehealth: Payer: Self-pay

## 2024-04-06 NOTE — Telephone Encounter (Signed)
 No answer after follow up call. Left VM.

## 2024-04-08 LAB — SURGICAL PATHOLOGY

## 2024-04-10 ENCOUNTER — Ambulatory Visit: Payer: Self-pay | Admitting: Gastroenterology

## 2024-04-10 NOTE — Progress Notes (Signed)
 [name],   The three small polyps that I removed during your recent procedure were completely benign but were proven to be pre-cancerous polyps that MAY have grown into cancers if they had not been removed.  Studies shows that at least 20% of women over age 73 and 30% of men over age 31 have pre-cancerous polyps.  Based on current nationally recognized surveillance guidelines, I recommend that you have a repeat colonoscopy in 5 years.   However, because colon cancer screening after age 77 is done on a case-by-case basis, taking into account the patient's risk factors for colon cancer, as well as comorbidities and life expectancy, I would recommend against further colon cancer screening for you.

## 2024-04-23 ENCOUNTER — Ambulatory Visit (HOSPITAL_BASED_OUTPATIENT_CLINIC_OR_DEPARTMENT_OTHER): Payer: Self-pay | Admitting: Physical Therapy

## 2024-04-27 ENCOUNTER — Encounter: Payer: Self-pay | Admitting: Family Medicine

## 2024-04-27 ENCOUNTER — Ambulatory Visit: Admitting: Family Medicine

## 2024-04-27 VITALS — BP 140/89 | HR 54 | Ht 66.0 in | Wt 123.1 lb

## 2024-04-27 DIAGNOSIS — I10 Essential (primary) hypertension: Secondary | ICD-10-CM

## 2024-04-27 DIAGNOSIS — N1832 Chronic kidney disease, stage 3b: Secondary | ICD-10-CM

## 2024-04-27 DIAGNOSIS — J449 Chronic obstructive pulmonary disease, unspecified: Secondary | ICD-10-CM | POA: Diagnosis not present

## 2024-04-27 DIAGNOSIS — I5022 Chronic systolic (congestive) heart failure: Secondary | ICD-10-CM

## 2024-04-27 DIAGNOSIS — E785 Hyperlipidemia, unspecified: Secondary | ICD-10-CM | POA: Diagnosis not present

## 2024-04-27 DIAGNOSIS — D229 Melanocytic nevi, unspecified: Secondary | ICD-10-CM | POA: Insufficient documentation

## 2024-04-27 DIAGNOSIS — M1A39X0 Chronic gout due to renal impairment, multiple sites, without tophus (tophi): Secondary | ICD-10-CM

## 2024-04-27 MED ORDER — ALBUTEROL SULFATE HFA 108 (90 BASE) MCG/ACT IN AERS: 2.0000 | INHALATION_SPRAY | Freq: Four times a day (QID) | RESPIRATORY_TRACT | 5 refills | Status: AC | PRN

## 2024-04-27 MED ORDER — NITROGLYCERIN 0.4 MG SL SUBL: 0.4000 mg | SUBLINGUAL_TABLET | SUBLINGUAL | 1 refills | Status: AC | PRN

## 2024-04-27 NOTE — Progress Notes (Unsigned)
 Established Patient Office Visit  Subjective   Patient ID: Andrew Townsend, male    DOB: 04/05/1951  Age: 73 y.o. MRN: [id number]  Chief Complaint  Patient presents with   Medical Management of Chronic Issues    HPI  Subjective - Follow-up for chronic medical problems, status post tibial fracture and hernia repair. Reports feeling much better. - Reports left foot numbness, which started after the tibial fracture. Describes it as the entire bottom of the foot feeling asleep. Underwent shockwave therapy with an orthopedic specialist a few months ago, which provided temporary relief for a few days, but notes that time has been more effective for improvement. The numbness is resolving, and he is not having issues with it now.  Medications Current medications include albuterol  inhaler prn (expired, requesting refills), allopurinol , buprenorphine  patch, Eliquis , Jardiance , Zetia , Trelegy inhaler, Lasix , gabapentin  1200 mg at bedtime, metoprolol , omeprazole , Percocet prn, potassium, rosuvastatin , Zanaflex , Gemtesa , and Ambien  (taken most nights). Also takes nitroglycerin  tablets prn, which are expired and need a refill.  PMH, PSH, FH, Social Hx PMHx: Gout, atrial fibrillation (on Eliquis ), CAD (has nitroglycerin ), hypertension, GERD, BPH, CKD, insomnia, chronic pain, tibial fracture, history of hernia repair. Pacemaker/defibrillator in situ, placed around 2018, batteries due for replacement in approximately 2 years. Denies ever feeling it discharge. Recent colonoscopy, advised no further screening needed due to age. PSH: Hernia repair, tibial fracture fixation, pacemaker/defibrillator placement. FH: Not discussed. Social Hx: Not discussed.  ROS Constitutional: Denies fever, chills. Neurological: History of left foot numbness, now improving. Dermatological: Reports a long-standing mole on his back. Notes a skin tag on his chest that resolved spontaneously.    The ASCVD Risk score (Arnett DK, et  al., 2019) failed to calculate for the following reasons:   Risk score cannot be calculated because patient has a medical history suggesting prior/existing ASCVD  Health Maintenance Due  Topic Date Due   Hepatitis C Screening  Never done      Objective:     BP (!) 140/89   Pulse (!) 54   Ht 5' 6 (1.676 m)   Wt 123 lb 1.9 oz (55.8 kg)   SpO2 96%   BMI 19.87 kg/m  {Vitals History (Optional):23777}  Physical Exam General: Well-appearing, no acute distress. CV: Regular rate and rhythm. No murmurs, rubs, or gallops. Pacemaker/defibrillator noted. PULM: Lungs clear to auscultation bilaterally. HEENT: EOMI. Oropharynx clear. Did not wear hearing aids to appointment. SKIN: Noted approx 1cm circular, symmetric, evenly pigmented, regular-bordered nevus on the left lateral neck below the pinna. No other suspicious lesions noted.     No results found for any visits on 04/27/24.      Assessment & Plan:   Suspicious nevus Assessment & Plan:  A long-standing approx 1cm nevus on the back was noted. It is symmetric with regular borders and uniform color. The only concerning feature is its size (>6 mm). Patient is unsure if it has grown. He was previously seen by a dermatologist years ago who was not concerned. Discussed options including observation, shave biopsy, or dermatology referral. Patient agreed to a dermatology referral for evaluation. - Refer to Dermatology for evaluation of the nevus.   Chronic obstructive pulmonary disease, unspecified COPD type (HCC) Assessment & Plan: Continue trelegy and albuterol  prn  Orders: -     Albuterol  Sulfate HFA; Inhale 2 puffs into the lungs every 6 (six) hours as needed for wheezing or shortness of breath.  Dispense: 24 g; Refill: 5  Primary hypertension  Stage 3b chronic  kidney disease (HCC) -     Hemoglobin A1c -     Comprehensive metabolic panel with GFR  Hyperlipidemia, unspecified hyperlipidemia type -     Lipid panel -      Hemoglobin A1c -     Comprehensive metabolic panel with GFR  Chronic gout due to renal impairment of multiple sites without tophus Assessment & Plan: Continue allopruinol 100mg  daily   Chronic systolic congestive heart failure (HCC) Assessment & Plan: Followed by cardiology.  Last echo in 2024. On jardiance , lasix , metoprolol . Continue current mgmt.    Other orders -     Nitroglycerin ; Place 1 tablet (0.4 mg total) under the tongue every 5 (five) minutes as needed for chest pain. If second dose is needed to relieve chest pain, seek immediate emergency medical attention.  Dispense: 25 tablet; Refill: 1     Return in about 1 year (around 04/27/2025) for physical.    Toribio MARLA Slain, MD

## 2024-04-27 NOTE — Assessment & Plan Note (Signed)
 Continue trelegy and albuterol  prn

## 2024-04-27 NOTE — Assessment & Plan Note (Signed)
 Followed by cardiology.  Last echo in 2024. On jardiance , lasix , metoprolol . Continue current mgmt.

## 2024-04-27 NOTE — Patient Instructions (Signed)
 It was nice to see you today,  We addressed the following topics today: - I have sent prescriptions for albuterol  and nitroglycerin  to your pharmacy. Please replace your expired inhalers. - Please go to the lab for a blood draw today for a lipid panel, A1C, and CMP. - I am referring you to a dermatologist to evaluate the mole on your neck. Someone from their office will call you to schedule an appointment. - Please schedule a follow-up appointment for an annual physical in one year. You can call to be seen sooner if any issues arise. - I will sign the handicap placard renewal form for you before you leave.  Have a great day,  Rolan Slain, MD

## 2024-04-27 NOTE — Assessment & Plan Note (Signed)
 Continue allopruinol 100mg  daily

## 2024-04-27 NOTE — Assessment & Plan Note (Signed)
 A long-standing approx 1cm nevus on the back was noted. It is symmetric with regular borders and uniform color. The only concerning feature is its size (>6 mm). Patient is unsure if it has grown. He was previously seen by a dermatologist years ago who was not concerned. Discussed options including observation, shave biopsy, or dermatology referral. Patient agreed to a dermatology referral for evaluation. - Refer to Dermatology for evaluation of the nevus.

## 2024-04-28 ENCOUNTER — Ambulatory Visit: Payer: Self-pay | Admitting: Family Medicine

## 2024-04-28 LAB — COMPREHENSIVE METABOLIC PANEL WITH GFR
ALT: 25 IU/L (ref 0–44)
AST: 31 IU/L (ref 0–40)
Albumin: 4.2 g/dL (ref 3.8–4.8)
Alkaline Phosphatase: 87 IU/L (ref 47–123)
BUN/Creatinine Ratio: 11 (ref 10–24)
BUN: 14 mg/dL (ref 8–27)
Bilirubin Total: 0.8 mg/dL (ref 0.0–1.2)
CO2: 25 mmol/L (ref 20–29)
Calcium: 9.4 mg/dL (ref 8.6–10.2)
Chloride: 104 mmol/L (ref 96–106)
Creatinine, Ser: 1.31 mg/dL — ABNORMAL HIGH (ref 0.76–1.27)
Globulin, Total: 2.2 g/dL (ref 1.5–4.5)
Glucose: 56 mg/dL — ABNORMAL LOW (ref 70–99)
Potassium: 4.4 mmol/L (ref 3.5–5.2)
Sodium: 142 mmol/L (ref 134–144)
Total Protein: 6.4 g/dL (ref 6.0–8.5)
eGFR: 57 mL/min/1.73 — ABNORMAL LOW (ref >59)

## 2024-04-28 LAB — LIPID PANEL
Chol/HDL Ratio: 2.7 ratio (ref 0.0–5.0)
Cholesterol, Total: 121 mg/dL (ref 100–199)
HDL: 45 mg/dL (ref >39)
LDL Chol Calc (NIH): 58 mg/dL (ref 0–99)
Triglycerides: 96 mg/dL (ref 0–149)
VLDL Cholesterol Cal: 18 mg/dL (ref 5–40)

## 2024-04-28 LAB — HEMOGLOBIN A1C
Est. average glucose Bld gHb Est-mCnc: 114 mg/dL
Hgb A1c MFr Bld: 5.6 % (ref 4.8–5.6)

## 2024-04-30 ENCOUNTER — Encounter: Payer: Self-pay | Admitting: Family Medicine

## 2024-06-01 ENCOUNTER — Other Ambulatory Visit: Payer: Self-pay | Admitting: Cardiology

## 2024-06-01 DIAGNOSIS — I480 Paroxysmal atrial fibrillation: Secondary | ICD-10-CM

## 2024-06-01 NOTE — Progress Notes (Unsigned)
 PROVIDER NOTE: Interpretation of information contained herein should be left to medically-trained personnel. Specific patient instructions are provided elsewhere under Patient Instructions section of medical record. This document was created in part using AI and STT-dictation technology, any transcriptional errors that may result from this process are unintentional.  Patient: Andrew Townsend  Service: E/M   PCP: Chandra Toribio POUR, MD  DOB: 09-28-1950  DOS: 06/02/2024  Provider: Emmy POUR Blanch, NP  MRN: [id number]  Delivery: Face-to-face  Specialty: Interventional Pain Management  Type: Established Patient  Setting: Ambulatory outpatient facility  Specialty designation: 09  Referring Prov.: Chandra Toribio POUR, MD  Location: Outpatient office facility       History of present illness (HPI) [name], a 74 y.o. year old male, is here today because of his No primary diagnosis found.. Mr. Auguste primary complain today is No chief complaint on file.  Pertinent problems: Mr. 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  Pain Assessment: Severity of   is reported as a  /10. Location:    / . Onset:  . Quality:  . Timing:  . Modifying factor(s):  SABRA Vitals:  vitals were not taken for this visit.  BMI: Estimated body mass index is 19.87 kg/m as calculated from the following:   Height as of 04/27/24: 5' 6 (1.676 m).   Weight as of 04/27/24: 123 lb 1.9 oz (55.8 kg).  Last  encounter: 03/09/2024. Last procedure: Visit date not found.  Reason for encounter: {Blank single:19197::medication management,post-procedure evaluation and assessment,both, medication management and post-procedure evaluation and assessment,evaluation of worsening, or previously known (established) problem,patient-requested evaluation,follow-up evaluation,evaluation for possible interventional PM therapy/treatment,evaluation of new problem, *** }.   Discussed the use of AI scribe software for clinical note transcription with the patient, who gave verbal consent to proceed.  History of Present Illness           Pharmacotherapy Assessment   Oxycodone -acetaminophen  (Percocet) 10-325 mg tablet every 8 hours as needed for pain. MME=45 Monitoring: [place] PMP: PDMP reviewed during this encounter.       Pharmacotherapy: No side-effects or adverse reactions reported. Compliance: No problems identified. Effectiveness: Clinically acceptable.  Andrew Townsend, [place]  03/09/2024 10:28 AM  Sign when Signing Visit Nursing Pain Medication Assessment:  Safety precautions to be maintained throughout the outpatient stay will include: orient to surroundings, keep bed in low position, maintain call bell within reach at all times, provide assistance with transfer out of bed and ambulation.  Medication Inspection Compliance: Pill count conducted under aseptic conditions, in front of the patient. Neither the pills nor the bottle was removed from the patient's sight at any time. Once count was completed pills were immediately returned to the patient in their original bottle.  Medication: Buprenorphine  patch (Butrans ) Pill/Patch Count: 1 of 4 pills/patches remain Pill/Patch Appearance: Markings consistent with prescribed medication Bottle Appearance: Standard pharmacy container. Clearly labeled. Filled Date: 10 / 01 / 2025 Last Medication intake:  03/04/2024  Andrew Townsend, CMA  03/09/2024 10:26  AM  Sign when Signing Visit Nursing Pain Medication Assessment:  Safety precautions to be maintained throughout  the outpatient stay will include: orient to surroundings, keep bed in low position, maintain call bell within reach at all times, provide assistance with transfer out of bed and ambulation.  Medication Inspection Compliance: Pill count conducted under aseptic conditions, in front of the patient. Neither the pills nor the bottle was removed from the patient's sight at any time. Once count was completed pills were immediately returned to the patient in their original bottle.  Medication: Oxycodone /APAP Pill/Patch Count: 30 of 90 pills/patches remain Pill/Patch Appearance: Markings consistent with prescribed medication Bottle Appearance: Standard pharmacy container. Clearly labeled. Filled Date: 10 / 01 / 2025 Last Medication intake:  Today    UDS:  Summary  Date Value Ref Range Status  12/18/2022 Note  Final    Comment:    ==================================================================== [employer] Select 13 (MW) ==================================================================== Test                             Result       Flag       Units  Drug Present and Declared for Prescription Verification   Oxycodone                       3473         EXPECTED   ng/mg creat   Oxymorphone                    1617         EXPECTED   ng/mg creat   Noroxycodone                   4722         EXPECTED   ng/mg creat   Noroxymorphone                 992          EXPECTED   ng/mg creat    Sources of oxycodone  are scheduled prescription medications.    Oxymorphone, noroxycodone, and noroxymorphone are expected    metabolites of oxycodone . Oxymorphone is also available as a    scheduled prescription medication.    Buprenorphine                   21           EXPECTED   ng/mg creat   Norbuprenorphine               36           EXPECTED   ng/mg creat    Source of buprenorphine  is a scheduled  prescription medication.    Norbuprenorphine is an expected metabolite of buprenorphine .  ==================================================================== Test                      Result    Flag   Units      Ref Range   Creatinine              89               mg/dL      >=79 ==================================================================== Declared Medications:  The flagging and interpretation on this report are based on the  following declared medications.  Unexpected results may arise from  inaccuracies in the declared medications.   **Note: The testing scope of this panel includes these medications:   Oxycodone  (Percocet)   **Note: The testing scope of this panel does not  include small to  moderate amounts of these reported medications:   Buprenorphine  Patch (BuTrans )   **Note: The testing scope of this panel does not include the  following reported medications:   Acetaminophen  (Percocet)  Albuterol   Allopurinol   Apixaban  (Eliquis )  Empagliflozin  (Jardiance )  Ezetimibe  (Zetia )  Fluticasone (Trelegy)  Furosemide  (Lasix )  Gabapentin  (Neurontin )  Magnesium (Mag-Ox)  Metoprolol   Multivitamin  Nitroglycerin  (Nitrostat )  Omeprazole   Oxybutynin   Potassium (Klor-Con )  Rosuvastatin   Tizanidine   Umeclidinium (Trelegy)  Vilanterol (Trelegy)  Zolpidem  (Ambien ) ==================================================================== For clinical consultation, please call [phone]. ====================================================================     No results found for: CBDTHCR No results found for: D8THCCBX No results found for: D9THCCBX  ROS  Constitutional: Denies any fever or chills Gastrointestinal: No reported hemesis, hematochezia, vomiting, or acute GI distress Musculoskeletal: Back Pain (Lower Back, Down Buttocks down right leg), left leg pain   Neurological: No reported episodes of acute onset apraxia, aphasia, dysarthria, agnosia,  amnesia, paralysis, loss of coordination, or loss of consciousness  Medication Review  Fluticasone-Umeclidin-Vilant, Magnesium Oxide, Vibegron , albuterol , allopurinol , apixaban , buprenorphine , docusate sodium, empagliflozin , ezetimibe , furosemide , gabapentin , metoprolol  succinate, multivitamin, naloxone , nitroGLYCERIN , omeprazole , oxyCODONE -acetaminophen , potassium chloride , rosuvastatin , tiZANidine , and zolpidem   History Review  Allergy: Mr. Balfour has no known allergies. Drug: Mr. Smethers  reports no history of drug use. Alcohol:  reports that he does not currently use alcohol. Tobacco:  reports that he quit smoking about 10 years ago. His smoking use included e-cigarettes and cigarettes. He uses smokeless tobacco. Social: Mr. Chrisley  reports that he quit smoking about 10 years ago. His smoking use included e-cigarettes and cigarettes. He uses smokeless tobacco. He reports that he does not currently use alcohol. He reports that he does not use drugs. 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 Surgical: Mr. Dickison  has a past surgical history that includes Coronary artery bypass graft; Hernia repair; Joint replacement (Right); Tonsillectomy; Wisdom tooth extraction; Upper gi endoscopy; Colonoscopy; arm surgery (Right); ICD IMPLANT; Eye surgery (Bilateral); Multiple tooth extractions; Mandible surgery; Inguinal hernia repair (Bilateral, 08/19/2023); and Inguinal hernia repair (Right, 02/12/2024). Family: family history is not on file.  Laboratory Chemistry  Profile   Renal Lab Results  Component Value Date   BUN 20 02/07/2024   CREATININE 1.42 (H) 02/07/2024   BCR 10 10/18/2023   GFRNONAA 52 (L) 02/07/2024    Hepatic Lab Results  Component Value Date   AST 26 04/17/2023   ALT 32 04/17/2023   ALBUMIN 3.9 10/18/2023   ALKPHOS 99 04/17/2023    Electrolytes Lab Results  Component Value Date   NA 139 02/07/2024   K 4.6 02/07/2024   CL 108 02/07/2024   CALCIUM  9.1 02/07/2024   MG 2.1 07/31/2022   PHOS 3.6 10/18/2023    Bone Lab Results  Component Value Date   VD25OH 64.6 11/07/2022    Inflammation (CRP: Acute Phase) (ESR: Chronic Phase) Lab Results  Component Value Date   CRP 18 (H) 06/26/2022   ESRSEDRATE 6 06/26/2022         Note: Above Lab results reviewed.  Recent Imaging Review  CT ABDOMEN PELVIS WO CONTRAST CLINICAL DATA:  Bilateral recurrent inguinal hernia.  EXAM: CT ABDOMEN AND PELVIS WITHOUT CONTRAST  TECHNIQUE: Multidetector  CT imaging of the abdomen and pelvis was performed following the standard protocol without IV contrast.  RADIATION DOSE REDUCTION: This exam was performed according to the departmental dose-optimization program which includes automated exposure control, adjustment of the mA and/or kV according to patient size and/or use of iterative reconstruction technique.  COMPARISON:  None Available.  FINDINGS: Lower chest: Heart is mildly enlarged. Subpleural scarring left lower lobe. No pleural effusion.  Hepatobiliary: The liver is normal in size and contour. No focal hepatic lesion identified. Gallbladder is unremarkable. No biliary ductal dilatation.  Pancreas: Unremarkable  Spleen: Unremarkable  Adrenals/Urinary Tract: The adrenal glands are normal. Kidneys are symmetric in size. No hydronephrosis. Urinary bladder is unremarkable.  Stomach/Bowel: Normal morphology of the stomach. Duodenal diverticulum. Descending colonic diverticulosis. No CT evidence for acute  diverticulitis. No evidence for bowel obstruction. Large amount of stool within the cecum, ascending and transverse colon. No free fluid or free intraperitoneal air.  Vascular/Lymphatic: Extensive peripheral calcification involving the abdominal aorta. Infrarenal abdominal aorta measures up to 2.8 cm (image 34; series 2). No retroperitoneal lymphadenopathy.  Reproductive: Heterogeneous prostate with dystrophic calcifications. Complex fluid left hemiscrotum, potentially large hydrocele.  Other: Complex appearing small bowel containing right inguinal hernia (image 33; series 3) without evidence for obstruction. Small fat containing left inguinal hernia. Evidence of prior lower anterior abdominal wall repair.  Musculoskeletal: Extensive lower thoracic and lumbar spine degenerative changes. No acute process.  IMPRESSION: 1. Complex appearing small bowel containing right inguinal hernia without evidence for obstruction. 2. Small fat containing left inguinal hernia. 3. Complex fluid left hemiscrotum, potentially large hydrocele. Consider further evaluation with scrotal ultrasound. 4. Colonic diverticulosis without CT evidence for acute diverticulitis. 5. Large amount of stool within the cecum, ascending and transverse colon. 6. Infrarenal abdominal aorta measures up to 2.8 cm. Recommend follow-up ultrasound every 5 years. (Ref.: J Vasc Surg. 2018; 67:2-77 and J Am Coll Radiol 2013;10(10):789-794.) . 7. Aortic atherosclerosis.  Electronically Signed   By: Bard Moats M.D.   On: 01/11/2024 11:09 Note: Reviewed        Physical Exam  Vitals: BP (!) 144/78 (BP Location: Right Arm, Patient Position: Sitting, Cuff Size: Normal)   Pulse 68   Temp (!) 97.1 F (36.2 C) (Temporal)   Resp 18   Ht 5' 6 (1.676 m)   Wt 126 lb (57.2 kg)   SpO2 100%   BMI 20.34 kg/m  BMI: Estimated body mass index is 20.34 kg/m as calculated from the following:   Height as of this encounter: 5' 6  (1.676 m).   Weight as of this encounter: 126 lb (57.2 kg). Ideal: Ideal body weight: 63.8 kg (140 lb 10.5 oz) General appearance: Well nourished, well developed, and well hydrated. In no apparent acute distress Mental status: Alert, oriented x 3 (person, place, & time)       Respiratory: No evidence of acute respiratory distress Eyes: PERLA  Musculoskeletal: +LBP radiating down to bilateral hip down to bilateral legs (R>L) Assessment   Diagnosis Status  1. Chronic radicular lumbar pain   2. 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 Chronic pain syndrome   4. Lumbar facet arthropathy   5. Medication management    Persistent Controlled Controlled   Updated Problems: No problems updated.  Plan of Care  Problem-specific:  Assessment and Plan    Monitoring: [place] PMP: PDMP not reviewed this encounter. {Blank single:19197::Unable to conduct review of the controlled substance reporting system due to technological failure.,     }  Pharmacotherapy: {Blank single:19197::Opioid-induced constipation (OIC)(K59.03, T40.2X5A),No side-effects or adverse reactions reported.} Compliance: {Blank single:19197::Medication agreement violation - unsanctioned acquisition/use of additional opioid-containing medication,No problems identified.} Effectiveness: {Blank single:19197::Clinically acceptable.}  No notes on file  UDS:  Summary  Date Value Ref Range Status  03/09/2024 FINAL  Final    Comment:    ==================================================================== [employer] Select 13 (MW) ==================================================================== Test                             Result       Flag       Units  Drug Present and Declared for Prescription Verification   Oxycodone                       3336         EXPECTED   ng/mg creat   Oxymorphone                    2037         EXPECTED   ng/mg creat   Noroxycodone                   8290         EXPECTED    ng/mg creat   Noroxymorphone                 1612         EXPECTED   ng/mg creat    Sources of oxycodone  are scheduled prescription medications.    Oxymorphone, noroxycodone, and noroxymorphone are expected    metabolites of oxycodone . Oxymorphone is also available as a    scheduled prescription medication.    Buprenorphine                   14           EXPECTED   ng/mg creat   Norbuprenorphine               64           EXPECTED   ng/mg creat    Source of buprenorphine  is a scheduled prescription medication.    Norbuprenorphine is an expected metabolite of buprenorphine .  ==================================================================== Test                      Result    Flag   Units      Ref Range   Creatinine              78               mg/dL      >=79 ==================================================================== Declared Medications:  The flagging and interpretation on this report are based on the  following declared medications.  Unexpected results may arise from  inaccuracies in the declared medications.   **Note: The testing scope of this panel includes these medications:   Oxycodone    **Note: The testing scope of this panel does not include small to  moderate amounts of these reported medications:   Buprenorphine  Patch (BuTrans )   **Note: The testing scope of this panel does not include the  following reported medications:   Acetaminophen   Albuterol   Allopurinol  (Zyloprim )  Apixaban  (Eliquis )  Docusate (Colace)  Empagliflozin  (Jardiance )  Ezetimibe  (Zetia )  Fluticasone (Trelegy)  Furosemide  (Lasix )  Gabapentin   Metoprolol  (Toprol )  Multivitamin  Naloxone  (Narcan )  Nitroglycerin  (Nitrostat )  Omeprazole  (Prilosec)  Potassium (Klor-Con )  Rosuvastatin  (Crestor )  Tizanidine  (Zanaflex )  Umeclidinium (Trelegy)  Vibegron  (Gemtesa )  Vilanterol (Trelegy)  Zolpidem  ==================================================================== For clinical  consultation, please call [phone]. ====================================================================     No results found for: CBDTHCR No results found for: D8THCCBX No results found for: D9THCCBX  ROS  Constitutional: {Blank single:19197::Denies any fever or chills} Gastrointestinal: {Blank single:19197::No reported hemesis, hematochezia, vomiting, or acute GI distress} Musculoskeletal: {Blank single:19197::Denies any acute onset joint swelling, redness, loss of ROM, or weakness} Neurological: {Blank single:19197::No reported episodes of acute onset apraxia, aphasia, dysarthria, agnosia, amnesia, paralysis, loss of coordination, or loss of consciousness}  Medication Review  Fluticasone-Umeclidin-Vilant, Magnesium Oxide, Vibegron , albuterol , allopurinol , apixaban , buprenorphine , docusate sodium, empagliflozin , ezetimibe , furosemide , gabapentin , metoprolol  succinate, multivitamin, naloxone , nitroGLYCERIN , omeprazole , oxyCODONE -acetaminophen , potassium chloride , rosuvastatin , tiZANidine , and zolpidem   History Review  Allergy: Mr. Gurry has no known allergies. Drug: Mr. Obar  reports no history of drug use. Alcohol:  reports that he does not currently use alcohol. Tobacco:  reports that he quit smoking about 11 years ago. His smoking use included e-cigarettes and cigarettes. He uses smokeless tobacco. Social: Mr. Negron  reports that he quit smoking about 11 years ago. His smoking use included e-cigarettes and cigarettes. He uses smokeless tobacco. He reports that he does not currently use alcohol. He reports that he does not use drugs. Medical:  has a past medical history of AICD (automatic cardioverter/defibrillator) present, Anemia, Arthritis, Blood transfusion without reported diagnosis, CAD (coronary artery disease), Cataract, Cerebral infarction, unspecified (HCC) (01/30/2022), Chronic kidney disease, COPD (chronic obstructive pulmonary disease) (HCC), GERD  (gastroesophageal reflux disease), History of kidney stones, Hyperlipidemia, Hypertension, Insomnia, 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), Myocardial infarction [facility]), Stroke (HCC), and VT (ventricular tachycardia) (HCC) (06/28/2016). Surgical: Mr. Litzau  has a past surgical history that includes Coronary artery bypass graft; Hernia repair; Joint replacement (Right); Tonsillectomy; Wisdom tooth extraction; Upper gi endoscopy; Colonoscopy; arm surgery (Right); ICD IMPLANT; Eye surgery (Bilateral); Multiple tooth extractions; Mandible surgery; Inguinal hernia repair (Bilateral, 08/19/2023); and Inguinal hernia repair (Right, 02/12/2024). Family: family history is not on file.  Laboratory Chemistry Profile   Renal Lab Results  Component Value Date   BUN 14 04/27/2024   CREATININE 1.31 (H) 04/27/2024   BCR 11 04/27/2024   GFRNONAA 52 (L) 02/07/2024    Hepatic Lab Results  Component Value Date   AST 31 04/27/2024   ALT 25 04/27/2024   ALBUMIN 4.2 04/27/2024   ALKPHOS 87 04/27/2024    Electrolytes Lab Results  Component Value Date   NA 142 04/27/2024   K 4.4 04/27/2024   CL 104 04/27/2024   CALCIUM  9.4 04/27/2024   MG 2.1 07/31/2022   PHOS 3.6 10/18/2023    Bone Lab Results  Component Value Date   VD25OH 64.6 11/07/2022    Inflammation (CRP: Acute Phase) (ESR: Chronic Phase) Lab Results  Component Value Date   CRP 18 (H) 06/26/2022   ESRSEDRATE 6 06/26/2022         Note: {Blank single:19197::Mr. Pavlicek indicates labs done and monitored by primary care practitioner using a non-[facility] EMR system,No results found under the [facility] electronic medical record,Patient noncompliant with Lab Work orders.,Results made available to patient.,Lab results reviewed and made available to patient.,Lab results reviewed and explained to patient in Layman's terms.,Above Lab results reviewed.}  Recent Imaging Review  CUP PACEART REMOTE DEVICE  CHECK ICD Scheduled remote reviewed. Normal device function.  Presenting rhythm:  VS 157 AF events, longest duration 1hr , controlled rates, burden 6.2%, Eliquis  per PA report.  Some EGM's  appear c/w SR with ectopy, interpretation limitied by V lead only Next remote transmission per protocol. LA, CVRS Note: {Blank single:19197::No new results found.,No results found under the [facility] electronic medical record.,Imaging results reviewed and explained to patient in Layman's terms.,Results of ordered imaging test(s) reviewed and explained to patient in Layman's terms.,Imaging results reviewed.,Reviewed} {Blank single:19197::Results visible under [facility].,Results visible under [facility].,Results visible under [facility].,Results visible under [facility].,Results visible under Care Everywhere.,Results made available to patient.,Copy of results provided to patient.,Patient noncompliant with diagnostic imaging orders.,     }  Physical Exam  Vitals: There were no vitals taken for this visit. BMI: Estimated body mass index is 19.87 kg/m as calculated from the following:   Height as of 04/27/24: 5' 6 (1.676 m).   Weight as of 04/27/24: 123 lb 1.9 oz (55.8 kg). Ideal: Patient weight not recorded General appearance: {general exam:210120802::Well nourished, well developed, and well hydrated. In no apparent acute distress} Mental status: {Blank single:19197::Alert and oriented x 3. Exaggerated physical and/or psychosocial pain behavior perceived.,Alert, oriented x 3 (person, place, & time)} {Blank single:19197::Mr. Genis's speech pattern and demeanor seems to suggest oversedation,     } Respiratory: {Blank single:19197::Oxygen-dependent COPD,No evidence of acute respiratory distress} Eyes: {Blank single:19197::Miotic (pupilary constriction) due to opiate use,Midriatic,Anisocoric,Evidence of ptosis,Pin-point pupils,PERLA}   Assessment    Diagnosis Status  No diagnosis found. {Blank single:19197::Absent,Deteriorating,Having a Flare-up,Improved,Improving,Not improving,Not responding,Persistent,Present,Recurring,Reoccurring,Resolved,Responding,Stable,Unchanged,improved,Worsened,Worsening,Controlled} {Blank single:19197::Absent,Deteriorating,Having a Flare-up,Improved,Improving,Not improving,Not responding,Persistent,Present,Recurring,Reoccurring,Resolved,Responding,Stable,Unchanged,improved,Worsened,Worsening,Controlled} {Blank single:19197::Absent,Deteriorating,Having a Flare-up,Improved,Improving,Not improving,Not responding,Persistent,Present,Recurring,Reoccurring,Resolved,Responding,Stable,Unchanged,improved,Worsened,Worsening,Controlled}   Updated Problems: No problems updated.  Plan of Care  Problem-specific:  Assessment and Plan            Mr. Macdonald Rigor has a current medication list which includes the following long-term medication(s): albuterol , albuterol , allopurinol , eliquis , ezetimibe , furosemide , gabapentin , metoprolol  succinate, nitroglycerin , omeprazole , potassium chloride , rosuvastatin , tizanidine , and zolpidem .  Pharmacotherapy (Medications Ordered): No orders of the defined types were placed in this encounter.  Orders:  No orders of the defined types were placed in this encounter.    {There is no content from the last Plan section.}   No follow-ups on file.    Recent Visits Date Type Provider Dept  03/09/24 Office Visit Madgie Dhaliwal K, NP [facility]  Showing recent visits within past 90 days and meeting all other requirements Future Appointments Date Type Provider Dept  06/02/24 Appointment Benford Asch K, NP [facility]  Showing future appointments within next 90 days and meeting all other requirements  I discussed the assessment and treatment plan with the  patient. The patient was provided an opportunity to ask questions and all were answered. The patient agreed with the plan and demonstrated an understanding of the instructions.  Patient advised to call back or seek an in-person evaluation if the symptoms or condition worsens.  Duration of encounter: *** minutes.  Total time on encounter, as per AMA guidelines included both the face-to-face and non-face-to-face time personally spent by the physician and/or other qualified health care professional(s) on the day of the encounter (includes time in activities that require the physician or other qualified health care professional and does not include time in activities normally performed by clinical staff). Physician's time may include the following activities when performed: Preparing to see the patient (e.g., pre-charting review of records, searching for previously ordered imaging, lab work, and nerve conduction tests) Review of prior analgesic pharmacotherapies. Reviewing PMP Interpreting ordered tests (e.g., lab work, imaging, nerve conduction tests) Performing post-procedure evaluations, including interpretation of diagnostic procedures Obtaining and/or reviewing  separately obtained history Performing a medically appropriate examination and/or evaluation Counseling and educating the patient/family/caregiver Ordering medications, tests, or procedures Referring and communicating with other health care professionals (when not separately reported) Documenting clinical information in the electronic or other health record Independently interpreting results (not separately reported) and communicating results to the patient/ family/caregiver Care coordination (not separately reported)  Note by: Clementine Soulliere K Rufino Staup, NP (TTS and AI technology used. I apologize for any typographical errors that were not detected and corrected.) Date: 06/02/2024; Time: 8:11 AM

## 2024-06-02 ENCOUNTER — Ambulatory Visit: Attending: Nurse Practitioner | Admitting: Nurse Practitioner

## 2024-06-02 ENCOUNTER — Encounter: Payer: Self-pay | Admitting: Nurse Practitioner

## 2024-06-02 VITALS — BP 129/67 | HR 72 | Temp 97.8°F | Resp 16 | Ht 66.0 in | Wt 122.0 lb

## 2024-06-02 DIAGNOSIS — G8929 Other chronic pain: Secondary | ICD-10-CM | POA: Diagnosis present

## 2024-06-02 DIAGNOSIS — M47816 Spondylosis without myelopathy or radiculopathy, lumbar region: Secondary | ICD-10-CM | POA: Insufficient documentation

## 2024-06-02 DIAGNOSIS — Z79891 Long term (current) use of opiate analgesic: Secondary | ICD-10-CM | POA: Diagnosis present

## 2024-06-02 DIAGNOSIS — M5416 Radiculopathy, lumbar region: Secondary | ICD-10-CM | POA: Diagnosis present

## 2024-06-02 DIAGNOSIS — Z79899 Other long term (current) drug therapy: Secondary | ICD-10-CM | POA: Diagnosis present

## 2024-06-02 DIAGNOSIS — G894 Chronic pain syndrome: Secondary | ICD-10-CM | POA: Diagnosis present

## 2024-06-02 DIAGNOSIS — G629 Polyneuropathy, unspecified: Secondary | ICD-10-CM | POA: Diagnosis present

## 2024-06-02 DIAGNOSIS — M461 Sacroiliitis, not elsewhere classified: Secondary | ICD-10-CM | POA: Diagnosis present

## 2024-06-02 DIAGNOSIS — M4125 Other idiopathic scoliosis, thoracolumbar region: Secondary | ICD-10-CM | POA: Insufficient documentation

## 2024-06-02 DIAGNOSIS — G5703 Lesion of sciatic nerve, bilateral lower limbs: Secondary | ICD-10-CM | POA: Diagnosis present

## 2024-06-02 DIAGNOSIS — G588 Other specified mononeuropathies: Secondary | ICD-10-CM | POA: Diagnosis present

## 2024-06-02 MED ORDER — GABAPENTIN 600 MG PO TABS: 1200.0000 mg | ORAL_TABLET | Freq: Every day | ORAL | 2 refills | Status: AC

## 2024-06-02 MED ORDER — MELOXICAM 15 MG PO TABS: 15.0000 mg | ORAL_TABLET | Freq: Every day | ORAL | 0 refills | Status: DC

## 2024-06-02 MED ORDER — OXYCODONE-ACETAMINOPHEN 10-325 MG PO TABS: 1.0000 | ORAL_TABLET | Freq: Three times a day (TID) | ORAL | 0 refills | Status: AC | PRN

## 2024-06-02 MED ORDER — BUPRENORPHINE 20 MCG/HR TD PTWK: 1.0000 | MEDICATED_PATCH | TRANSDERMAL | 2 refills | Status: AC

## 2024-06-02 NOTE — Progress Notes (Signed)
 Nursing Pain Medication Assessment:  Safety precautions to be maintained throughout the outpatient stay will include: orient to surroundings, keep bed in low position, maintain call bell within reach at all times, provide assistance with transfer out of bed and ambulation.  Medication Inspection Compliance: Pill count conducted under aseptic conditions, in front of the patient. Neither the pills nor the bottle was removed from the patient's sight at any time. Once count was completed pills were immediately returned to the patient in their original bottle.  Medication #1: Oxycodone /APAP Pill/Patch Count: 49 of 90 pills/patches remain Pill/Patch Appearance: Markings consistent with prescribed medication Bottle Appearance: Standard pharmacy container. Clearly labeled. Filled Date: 44 / 67 / 2025 Last Medication intake:  Today  Medication #2: Buprenorphine  patch (Butrans ) Pill/Patch Count: 2 of 4 patches remain Pill/Patch Appearance: Markings consistent with prescribed medication Bottle Appearance: Standard pharmacy container. Clearly labeled. Filled Date: 70 / 29 / 2025 Last Medication intake:  applied 1 patch 1 week ago

## 2024-06-02 NOTE — Patient Instructions (Signed)

## 2024-06-04 ENCOUNTER — Telehealth: Payer: Self-pay | Admitting: Cardiology

## 2024-06-04 NOTE — Telephone Encounter (Signed)
 Pt c/o medication issue:  1. Name of Medication:  meloxicam  (MOBIC ) 15 MG tablet  2. How are you currently taking this medication (dosage and times per day)?   3. Are you having a reaction (difficulty breathing--STAT)?   4. What is your medication issue?   Patient would like a call back to discuss taking Meloxicam  with Eliquis .

## 2024-06-05 NOTE — Telephone Encounter (Signed)
 Patient has been notified of recommendations.  He said the pain management doctor has recommended this in place of tizantine to help with his ongoing back pain.  Based on PharmD recommendation, however, he will call and let pain management know he is not going to switch. Pt thankful for the information provided.

## 2024-06-08 ENCOUNTER — Telehealth: Payer: Self-pay | Admitting: Nurse Practitioner

## 2024-06-08 ENCOUNTER — Other Ambulatory Visit: Payer: Self-pay | Admitting: Nurse Practitioner

## 2024-06-08 DIAGNOSIS — G5703 Lesion of sciatic nerve, bilateral lower limbs: Secondary | ICD-10-CM

## 2024-06-08 DIAGNOSIS — Z79899 Other long term (current) drug therapy: Secondary | ICD-10-CM

## 2024-06-08 MED ORDER — TIZANIDINE HCL 4 MG PO TABS: 4.0000 mg | ORAL_TABLET | Freq: Four times a day (QID) | ORAL | 5 refills | Status: AC | PRN

## 2024-06-08 NOTE — Telephone Encounter (Signed)
 Patient informed. Also instructed him to stop the Celebrex.

## 2024-06-08 NOTE — Telephone Encounter (Signed)
 Pharmacist and cardiologist recommend that Andrew Townsend  not be used concurrently with Eliquis .  Patient is willing to change back to Tizanidine .

## 2024-06-08 NOTE — Telephone Encounter (Signed)
 Pt called has concern about meds that has been prescribed. Pt stated heart doctor do not think its a good idea.

## 2024-06-09 ENCOUNTER — Ambulatory Visit

## 2024-06-09 DIAGNOSIS — I255 Ischemic cardiomyopathy: Secondary | ICD-10-CM | POA: Diagnosis not present

## 2024-06-09 LAB — CUP PACEART REMOTE DEVICE CHECK
Battery Remaining Longevity: 32 mo
Battery Voltage: 2.94 V
Brady Statistic RV Percent Paced: 6.19 %
Date Time Interrogation Session: 20260120052508
HighPow Impedance: 57 Ohm
Implantable Lead Connection Status: 753985
Implantable Lead Implant Date: 20180209
Implantable Lead Location: 753860
Implantable Lead Model: 6935
Implantable Pulse Generator Implant Date: 20180209
Lead Channel Impedance Value: 304 Ohm
Lead Channel Impedance Value: 399 Ohm
Lead Channel Sensing Intrinsic Amplitude: 2.75 mV
Lead Channel Sensing Intrinsic Amplitude: 2.75 mV
Lead Channel Setting Pacing Amplitude: 2 V
Lead Channel Setting Pacing Pulse Width: 0.5 ms
Lead Channel Setting Sensing Sensitivity: 0.3 mV
Zone Setting Status: 755011

## 2024-06-12 NOTE — Progress Notes (Signed)
 Remote ICD Transmission

## 2024-06-16 ENCOUNTER — Ambulatory Visit: Payer: Self-pay | Admitting: Cardiovascular Disease

## 2024-07-09 ENCOUNTER — Ambulatory Visit: Admitting: Dermatology

## 2024-08-06 ENCOUNTER — Ambulatory Visit: Payer: Medicare PPO

## 2024-08-25 ENCOUNTER — Encounter: Admitting: Nurse Practitioner

## 2024-09-08 ENCOUNTER — Ambulatory Visit

## 2024-12-08 ENCOUNTER — Ambulatory Visit

## 2025-03-09 ENCOUNTER — Ambulatory Visit

## 2025-04-20 ENCOUNTER — Other Ambulatory Visit

## 2025-04-27 ENCOUNTER — Encounter: Admitting: Family Medicine

## 2025-06-08 ENCOUNTER — Ambulatory Visit
